# Patient Record
Sex: Female | Born: 1950 | Race: White | Hispanic: No | Smoking: Never smoker
Health system: Southern US, Community
[De-identification: ages and names within clinical notes are randomized; demographics above are authoritative.]

## PROBLEM LIST (undated history)

## (undated) DIAGNOSIS — E785 Hyperlipidemia, unspecified: Secondary | ICD-10-CM

## (undated) DIAGNOSIS — L0291 Cutaneous abscess, unspecified: Secondary | ICD-10-CM

## (undated) DIAGNOSIS — K219 Gastro-esophageal reflux disease without esophagitis: Secondary | ICD-10-CM

## (undated) DIAGNOSIS — D472 Monoclonal gammopathy: Secondary | ICD-10-CM

## (undated) DIAGNOSIS — D649 Anemia, unspecified: Secondary | ICD-10-CM

## (undated) DIAGNOSIS — F329 Major depressive disorder, single episode, unspecified: Secondary | ICD-10-CM

## (undated) DIAGNOSIS — N179 Acute kidney failure, unspecified: Secondary | ICD-10-CM

## (undated) DIAGNOSIS — Z5189 Encounter for other specified aftercare: Secondary | ICD-10-CM

## (undated) DIAGNOSIS — E1165 Type 2 diabetes mellitus with hyperglycemia: Secondary | ICD-10-CM

## (undated) DIAGNOSIS — N189 Chronic kidney disease, unspecified: Secondary | ICD-10-CM

## (undated) DIAGNOSIS — G459 Transient cerebral ischemic attack, unspecified: Secondary | ICD-10-CM

## (undated) DIAGNOSIS — E118 Type 2 diabetes mellitus with unspecified complications: Secondary | ICD-10-CM

## (undated) DIAGNOSIS — I2510 Atherosclerotic heart disease of native coronary artery without angina pectoris: Secondary | ICD-10-CM

## (undated) DIAGNOSIS — L0390 Cellulitis, unspecified: Secondary | ICD-10-CM

## (undated) DIAGNOSIS — I10 Essential (primary) hypertension: Secondary | ICD-10-CM

## (undated) HISTORY — DX: Atherosclerotic heart disease of native coronary artery without angina pectoris: I25.10

## (undated) HISTORY — DX: Chronic kidney disease, unspecified: N18.9

## (undated) HISTORY — DX: Hyperlipidemia, unspecified: E78.5

## (undated) HISTORY — DX: Type 2 diabetes mellitus with unspecified complications: E11.8

## (undated) HISTORY — DX: Anemia, unspecified: D64.9

## (undated) HISTORY — DX: Major depressive disorder, single episode, unspecified: F32.9

## (undated) HISTORY — DX: Acute kidney failure, unspecified: N17.9

## (undated) HISTORY — DX: Monoclonal gammopathy: D47.2

## (undated) HISTORY — DX: Gastro-esophageal reflux disease without esophagitis: K21.9

## (undated) HISTORY — DX: Type 2 diabetes mellitus with hyperglycemia: E11.65

## (undated) HISTORY — DX: Essential (primary) hypertension: I10

## (undated) HISTORY — PX: BLADDER SURGERY: SHX569

## (undated) HISTORY — PX: PARTIAL HYSTERECTOMY: SHX80

## (undated) HISTORY — PX: RECTOCELE REPAIR: SHX761

## (undated) HISTORY — DX: Transient cerebral ischemic attack, unspecified: G45.9

---

## 1999-02-13 ENCOUNTER — Other Ambulatory Visit: Admission: RE | Admit: 1999-02-13 | Discharge: 1999-02-13 | Payer: Self-pay | Admitting: *Deleted

## 2004-07-07 ENCOUNTER — Encounter (INDEPENDENT_AMBULATORY_CARE_PROVIDER_SITE_OTHER): Payer: Self-pay | Admitting: Family Medicine

## 2006-09-26 ENCOUNTER — Ambulatory Visit: Payer: Self-pay | Admitting: Sports Medicine

## 2006-10-17 ENCOUNTER — Emergency Department (HOSPITAL_COMMUNITY): Admission: EM | Admit: 2006-10-17 | Discharge: 2006-10-17 | Payer: Self-pay | Admitting: Emergency Medicine

## 2006-11-27 ENCOUNTER — Ambulatory Visit: Payer: Self-pay | Admitting: Family Medicine

## 2007-02-21 ENCOUNTER — Ambulatory Visit: Payer: Self-pay | Admitting: Family Medicine

## 2007-02-21 ENCOUNTER — Encounter (INDEPENDENT_AMBULATORY_CARE_PROVIDER_SITE_OTHER): Payer: Self-pay | Admitting: Family Medicine

## 2007-02-27 DIAGNOSIS — IMO0001 Reserved for inherently not codable concepts without codable children: Secondary | ICD-10-CM

## 2007-02-27 DIAGNOSIS — I10 Essential (primary) hypertension: Secondary | ICD-10-CM

## 2007-02-27 DIAGNOSIS — I2510 Atherosclerotic heart disease of native coronary artery without angina pectoris: Secondary | ICD-10-CM | POA: Insufficient documentation

## 2007-02-27 DIAGNOSIS — F329 Major depressive disorder, single episode, unspecified: Secondary | ICD-10-CM

## 2007-02-27 DIAGNOSIS — D649 Anemia, unspecified: Secondary | ICD-10-CM

## 2007-02-27 DIAGNOSIS — M545 Low back pain: Secondary | ICD-10-CM

## 2007-02-27 DIAGNOSIS — E785 Hyperlipidemia, unspecified: Secondary | ICD-10-CM

## 2007-02-27 DIAGNOSIS — K219 Gastro-esophageal reflux disease without esophagitis: Secondary | ICD-10-CM

## 2007-02-27 DIAGNOSIS — E1165 Type 2 diabetes mellitus with hyperglycemia: Secondary | ICD-10-CM

## 2007-02-27 DIAGNOSIS — IMO0002 Reserved for concepts with insufficient information to code with codable children: Secondary | ICD-10-CM

## 2007-02-27 DIAGNOSIS — E118 Type 2 diabetes mellitus with unspecified complications: Secondary | ICD-10-CM

## 2007-02-27 DIAGNOSIS — F3289 Other specified depressive episodes: Secondary | ICD-10-CM

## 2007-02-27 HISTORY — DX: Anemia, unspecified: D64.9

## 2007-02-27 HISTORY — DX: Gastro-esophageal reflux disease without esophagitis: K21.9

## 2007-02-27 HISTORY — DX: Atherosclerotic heart disease of native coronary artery without angina pectoris: I25.10

## 2007-02-27 HISTORY — DX: Major depressive disorder, single episode, unspecified: F32.9

## 2007-02-27 HISTORY — DX: Hyperlipidemia, unspecified: E78.5

## 2007-02-27 HISTORY — DX: Type 2 diabetes mellitus with hyperglycemia: E11.65

## 2007-02-27 HISTORY — DX: Reserved for concepts with insufficient information to code with codable children: IMO0002

## 2007-02-27 HISTORY — DX: Other specified depressive episodes: F32.89

## 2007-02-27 HISTORY — DX: Essential (primary) hypertension: I10

## 2007-03-19 ENCOUNTER — Telehealth: Payer: Self-pay | Admitting: *Deleted

## 2007-04-08 ENCOUNTER — Telehealth (INDEPENDENT_AMBULATORY_CARE_PROVIDER_SITE_OTHER): Payer: Self-pay | Admitting: *Deleted

## 2007-04-22 ENCOUNTER — Telehealth (INDEPENDENT_AMBULATORY_CARE_PROVIDER_SITE_OTHER): Payer: Self-pay | Admitting: *Deleted

## 2007-04-24 ENCOUNTER — Telehealth (INDEPENDENT_AMBULATORY_CARE_PROVIDER_SITE_OTHER): Payer: Self-pay | Admitting: *Deleted

## 2007-05-16 ENCOUNTER — Ambulatory Visit: Payer: Self-pay | Admitting: Family Medicine

## 2007-05-16 ENCOUNTER — Telehealth: Payer: Self-pay | Admitting: *Deleted

## 2007-05-16 LAB — CONVERTED CEMR LAB: WBC, UA: >20 cells/hpf

## 2007-06-24 ENCOUNTER — Telehealth: Payer: Self-pay | Admitting: *Deleted

## 2007-06-26 ENCOUNTER — Ambulatory Visit: Payer: Self-pay | Admitting: Sports Medicine

## 2007-06-26 ENCOUNTER — Telehealth: Payer: Self-pay | Admitting: *Deleted

## 2007-06-26 LAB — CONVERTED CEMR LAB
Nitrite: NEGATIVE
Protein, U semiquant: 30
Urobilinogen, UA: 0.2
WBC, UA: >20 cells/hpf

## 2007-07-01 ENCOUNTER — Telehealth: Payer: Self-pay | Admitting: *Deleted

## 2007-08-28 ENCOUNTER — Ambulatory Visit: Payer: Self-pay | Admitting: Hematology and Oncology

## 2007-10-01 ENCOUNTER — Telehealth (INDEPENDENT_AMBULATORY_CARE_PROVIDER_SITE_OTHER): Payer: Self-pay | Admitting: Family Medicine

## 2007-10-13 ENCOUNTER — Telehealth (INDEPENDENT_AMBULATORY_CARE_PROVIDER_SITE_OTHER): Payer: Self-pay | Admitting: *Deleted

## 2007-10-20 ENCOUNTER — Ambulatory Visit: Payer: Self-pay | Admitting: Hematology and Oncology

## 2007-10-21 ENCOUNTER — Encounter (INDEPENDENT_AMBULATORY_CARE_PROVIDER_SITE_OTHER): Payer: Self-pay | Admitting: Family Medicine

## 2007-10-21 LAB — CONVERTED CEMR LAB
HCT: 24 %
Hemoglobin: 8.4 g/dL
Platelets: 166 10*3/uL
Sed Rate: 20 mm/hr
Sodium: 137 meq/L

## 2007-10-21 LAB — CBC & DIFF AND RETIC
Basophils Absolute: 0 10*3/uL (ref 0.0–0.1)
EOS%: 2.4 % (ref 0.0–7.0)
Eosinophils Absolute: 0.1 10*3/uL (ref 0.0–0.5)
HCT: 24 % — ABNORMAL LOW (ref 34.8–46.6)
HGB: 8.4 g/dL — ABNORMAL LOW (ref 11.6–15.9)
IRF: 0.29 (ref 0.130–0.330)
MCH: 31.8 pg (ref 26.0–34.0)
MCV: 90.1 fL (ref 81.0–101.0)
NEUT#: 3.5 10*3/uL (ref 1.5–6.5)
NEUT%: 70.2 % (ref 39.6–76.8)
RETIC #: 55.1 10*3/uL (ref 19.7–115.1)
lymph#: 1.1 10*3/uL (ref 0.9–3.3)

## 2007-10-21 LAB — URINALYSIS, MICROSCOPIC - CHCC
Blood: NEGATIVE
Ketones: NEGATIVE mg/dL
Protein: <30 mg/dL
RBC count: NEGATIVE (ref 0–2)
pH: 6 (ref 4.6–8.0)

## 2007-10-21 LAB — ERYTHROCYTE SEDIMENTATION RATE: Sed Rate: 20 mm/hr (ref 0–30)

## 2007-10-23 LAB — COMPREHENSIVE METABOLIC PANEL
ALT: 37 U/L — ABNORMAL HIGH (ref 0–35)
Albumin: 4 g/dL (ref 3.5–5.2)
CO2: 18 mEq/L — ABNORMAL LOW (ref 19–32)
Calcium: 8.8 mg/dL (ref 8.4–10.5)
Chloride: 109 mEq/L (ref 96–112)
Glucose, Bld: 375 mg/dL — ABNORMAL HIGH (ref 70–99)
Potassium: 5.8 mEq/L — ABNORMAL HIGH (ref 3.5–5.3)
Sodium: 137 mEq/L (ref 135–145)
Total Bilirubin: 0.2 mg/dL — ABNORMAL LOW (ref 0.3–1.2)
Total Protein: 6.4 g/dL (ref 6.0–8.3)

## 2007-10-23 LAB — PROTEIN ELECTROPHORESIS, SERUM
Albumin ELP: 61.7 % (ref 55.8–66.1)
Alpha-1-Globulin: 4.5 % (ref 2.9–4.9)
Alpha-2-Globulin: 9.7 % (ref 7.1–11.8)
Beta 2: 3.9 % (ref 3.2–6.5)
Beta Globulin: 4.9 % (ref 4.7–7.2)
Gamma Globulin: 15.3 % (ref 11.1–18.8)

## 2007-10-23 LAB — FOLATE: Folate: >20 ng/mL

## 2007-10-23 LAB — LACTATE DEHYDROGENASE: LDH: 180 U/L (ref 94–250)

## 2007-10-23 LAB — DIRECT ANTIGLOBULIN TEST (NOT AT ARMC): DAT (Complement): NEGATIVE

## 2007-10-23 LAB — ERYTHROPOIETIN: Erythropoietin: 7.6 m[IU]/mL (ref 2.6–34.0)

## 2007-10-23 LAB — IRON AND TIBC: Iron: 98 ug/dL (ref 42–145)

## 2007-10-27 ENCOUNTER — Telehealth (INDEPENDENT_AMBULATORY_CARE_PROVIDER_SITE_OTHER): Payer: Self-pay | Admitting: Family Medicine

## 2007-10-29 ENCOUNTER — Encounter: Payer: Self-pay | Admitting: Hematology and Oncology

## 2007-10-29 ENCOUNTER — Other Ambulatory Visit: Admission: RE | Admit: 2007-10-29 | Discharge: 2007-10-29 | Payer: Self-pay | Admitting: Hematology and Oncology

## 2007-11-04 ENCOUNTER — Encounter (INDEPENDENT_AMBULATORY_CARE_PROVIDER_SITE_OTHER): Payer: Self-pay | Admitting: Family Medicine

## 2007-11-04 LAB — CONVERTED CEMR LAB
Albumin: 4 g/dL
Creatinine, Ser: 1.2 mg/dL
Ferritin: 371 ng/mL
Iron: 98 ug/dL
Saturation Ratios: 39 %
TIBC: 253 ug/dL
Total Bilirubin: 0.2 mg/dL
UIBC: 155 ug/dL

## 2007-11-05 ENCOUNTER — Telehealth (INDEPENDENT_AMBULATORY_CARE_PROVIDER_SITE_OTHER): Payer: Self-pay | Admitting: *Deleted

## 2007-11-06 ENCOUNTER — Encounter (INDEPENDENT_AMBULATORY_CARE_PROVIDER_SITE_OTHER): Payer: Self-pay | Admitting: Family Medicine

## 2007-11-06 ENCOUNTER — Ambulatory Visit: Payer: Self-pay | Admitting: Family Medicine

## 2007-11-13 ENCOUNTER — Telehealth: Payer: Self-pay | Admitting: *Deleted

## 2007-11-13 LAB — CONVERTED CEMR LAB
Calcium: 8.7 mg/dL (ref 8.4–10.5)
Potassium: 4.9 meq/L (ref 3.5–5.3)
Sodium: 143 meq/L (ref 135–145)

## 2007-11-19 LAB — CBC WITH DIFFERENTIAL/PLATELET
BASO%: 0.7 % (ref 0.0–2.0)
Eosinophils Absolute: 0.1 10*3/uL (ref 0.0–0.5)
LYMPH%: 28.8 % (ref 14.0–48.0)
MCHC: 33.4 g/dL (ref 32.0–36.0)
MONO#: 0.3 10*3/uL (ref 0.1–0.9)
MONO%: 5 % (ref 0.0–13.0)
NEUT#: 3.2 10*3/uL (ref 1.5–6.5)
Platelets: 194 10*3/uL (ref 145–400)
RBC: 3.43 10*6/uL — ABNORMAL LOW (ref 3.70–5.32)
RDW: 12.6 % (ref 11.3–14.5)
WBC: 5 10*3/uL (ref 3.9–10.0)

## 2007-12-08 ENCOUNTER — Ambulatory Visit: Payer: Self-pay | Admitting: Hematology and Oncology

## 2007-12-22 ENCOUNTER — Telehealth: Payer: Self-pay | Admitting: *Deleted

## 2008-03-08 ENCOUNTER — Ambulatory Visit: Payer: Self-pay | Admitting: Family Medicine

## 2008-03-10 ENCOUNTER — Telehealth: Payer: Self-pay | Admitting: *Deleted

## 2008-03-16 ENCOUNTER — Telehealth: Payer: Self-pay | Admitting: *Deleted

## 2008-03-31 ENCOUNTER — Telehealth (INDEPENDENT_AMBULATORY_CARE_PROVIDER_SITE_OTHER): Payer: Self-pay | Admitting: *Deleted

## 2008-04-06 ENCOUNTER — Telehealth (INDEPENDENT_AMBULATORY_CARE_PROVIDER_SITE_OTHER): Payer: Self-pay | Admitting: Family Medicine

## 2008-04-07 ENCOUNTER — Telehealth: Payer: Self-pay | Admitting: *Deleted

## 2008-05-25 ENCOUNTER — Encounter (INDEPENDENT_AMBULATORY_CARE_PROVIDER_SITE_OTHER): Payer: Self-pay | Admitting: Family Medicine

## 2008-05-31 ENCOUNTER — Telehealth (INDEPENDENT_AMBULATORY_CARE_PROVIDER_SITE_OTHER): Payer: Self-pay | Admitting: Family Medicine

## 2008-06-07 ENCOUNTER — Telehealth: Payer: Self-pay | Admitting: *Deleted

## 2008-06-25 ENCOUNTER — Encounter (INDEPENDENT_AMBULATORY_CARE_PROVIDER_SITE_OTHER): Payer: Self-pay | Admitting: Family Medicine

## 2008-06-28 ENCOUNTER — Telehealth: Payer: Self-pay | Admitting: *Deleted

## 2008-06-30 ENCOUNTER — Ambulatory Visit: Payer: Self-pay | Admitting: Family Medicine

## 2008-06-30 ENCOUNTER — Encounter (INDEPENDENT_AMBULATORY_CARE_PROVIDER_SITE_OTHER): Payer: Self-pay | Admitting: Family Medicine

## 2008-06-30 DIAGNOSIS — F411 Generalized anxiety disorder: Secondary | ICD-10-CM | POA: Insufficient documentation

## 2008-06-30 DIAGNOSIS — R3915 Urgency of urination: Secondary | ICD-10-CM

## 2008-06-30 LAB — CONVERTED CEMR LAB
Bilirubin Urine: NEGATIVE
Blood in Urine, dipstick: NEGATIVE
Glucose, Urine, Semiquant: NEGATIVE
Protein, U semiquant: NEGATIVE
Specific Gravity, Urine: 1.015
pH: 5

## 2008-07-03 ENCOUNTER — Telehealth (INDEPENDENT_AMBULATORY_CARE_PROVIDER_SITE_OTHER): Payer: Self-pay | Admitting: Family Medicine

## 2008-07-19 ENCOUNTER — Ambulatory Visit: Payer: Self-pay | Admitting: Cardiovascular Disease

## 2008-07-19 LAB — CONVERTED CEMR LAB
BUN: 23 mg/dL (ref 6–23)
Calcium: 9 mg/dL (ref 8.4–10.5)
Eosinophils Absolute: 0.1 10*3/uL (ref 0.0–0.7)
Eosinophils Relative: 2 % (ref 0.0–5.0)
GFR calc Af Amer: 54 mL/min
GFR calc non Af Amer: 45 mL/min
Glucose, Bld: 94 mg/dL (ref 70–99)
HCT: 23.4 % — CL (ref 36.0–46.0)
Hemoglobin: 8 g/dL — ABNORMAL LOW (ref 12.0–15.0)
INR: 0.9 (ref 0.8–1.0)
MCV: 94.8 fL (ref 78.0–100.0)
Monocytes Absolute: 0.2 10*3/uL (ref 0.1–1.0)
Monocytes Relative: 3.6 % (ref 3.0–12.0)
Neutro Abs: 3.5 10*3/uL (ref 1.4–7.7)
Platelets: 157 10*3/uL (ref 150–400)
RDW: 11.9 % (ref 11.5–14.6)
WBC: 5.1 10*3/uL (ref 4.5–10.5)

## 2008-07-20 ENCOUNTER — Encounter (INDEPENDENT_AMBULATORY_CARE_PROVIDER_SITE_OTHER): Payer: Self-pay | Admitting: Family Medicine

## 2008-07-28 ENCOUNTER — Ambulatory Visit: Payer: Self-pay | Admitting: Family Medicine

## 2008-07-28 ENCOUNTER — Encounter (INDEPENDENT_AMBULATORY_CARE_PROVIDER_SITE_OTHER): Payer: Self-pay | Admitting: Family Medicine

## 2008-07-28 ENCOUNTER — Telehealth: Payer: Self-pay | Admitting: *Deleted

## 2008-07-28 ENCOUNTER — Encounter: Admission: RE | Admit: 2008-07-28 | Discharge: 2008-07-28 | Payer: Self-pay | Admitting: Family Medicine

## 2008-07-29 ENCOUNTER — Telehealth: Payer: Self-pay | Admitting: *Deleted

## 2008-07-29 ENCOUNTER — Telehealth (INDEPENDENT_AMBULATORY_CARE_PROVIDER_SITE_OTHER): Payer: Self-pay | Admitting: Family Medicine

## 2008-08-01 LAB — CONVERTED CEMR LAB
ALT: 18 units/L (ref 0–35)
AST: 15 units/L (ref 0–37)
Alkaline Phosphatase: 107 units/L (ref 39–117)
Glucose, Bld: 311 mg/dL — ABNORMAL HIGH (ref 70–99)
MCHC: 32 g/dL (ref 30.0–36.0)
MCV: 97.3 fL (ref 78.0–100.0)
Platelets: 182 10*3/uL (ref 150–400)
Potassium: 5.8 meq/L — ABNORMAL HIGH (ref 3.5–5.3)
RDW: 13.2 % (ref 11.5–15.5)
Sodium: 140 meq/L (ref 135–145)
Total Bilirubin: 0.3 mg/dL (ref 0.3–1.2)
Total Protein: 6.5 g/dL (ref 6.0–8.3)
Vitamin B-12: 898 pg/mL (ref 211–911)

## 2008-08-02 ENCOUNTER — Encounter (INDEPENDENT_AMBULATORY_CARE_PROVIDER_SITE_OTHER): Payer: Self-pay | Admitting: Family Medicine

## 2008-08-02 ENCOUNTER — Ambulatory Visit: Payer: Self-pay | Admitting: Family Medicine

## 2008-08-03 ENCOUNTER — Ambulatory Visit: Payer: Self-pay | Admitting: Cardiology

## 2008-08-03 ENCOUNTER — Encounter (INDEPENDENT_AMBULATORY_CARE_PROVIDER_SITE_OTHER): Payer: Self-pay | Admitting: Family Medicine

## 2008-08-03 ENCOUNTER — Encounter: Payer: Self-pay | Admitting: Family Medicine

## 2008-08-03 ENCOUNTER — Ambulatory Visit: Payer: Self-pay | Admitting: Family Medicine

## 2008-08-03 ENCOUNTER — Telehealth (INDEPENDENT_AMBULATORY_CARE_PROVIDER_SITE_OTHER): Payer: Self-pay | Admitting: Family Medicine

## 2008-08-03 ENCOUNTER — Inpatient Hospital Stay (HOSPITAL_COMMUNITY): Admission: EM | Admit: 2008-08-03 | Discharge: 2008-08-06 | Payer: Self-pay | Admitting: Emergency Medicine

## 2008-08-04 ENCOUNTER — Encounter (INDEPENDENT_AMBULATORY_CARE_PROVIDER_SITE_OTHER): Payer: Self-pay | Admitting: Family Medicine

## 2008-08-04 LAB — CONVERTED CEMR LAB
HDL: 41 mg/dL
LDL Cholesterol: 65 mg/dL
VLDL: 23 mg/dL

## 2008-08-05 ENCOUNTER — Ambulatory Visit: Payer: Self-pay | Admitting: Hematology and Oncology

## 2008-08-09 ENCOUNTER — Encounter (INDEPENDENT_AMBULATORY_CARE_PROVIDER_SITE_OTHER): Payer: Self-pay | Admitting: Family Medicine

## 2008-08-09 ENCOUNTER — Encounter (HOSPITAL_COMMUNITY): Admission: RE | Admit: 2008-08-09 | Discharge: 2008-09-09 | Payer: Self-pay | Admitting: Oncology

## 2008-08-09 LAB — MORPHOLOGY: PLT EST: ADEQUATE

## 2008-08-09 LAB — CBC & DIFF AND RETIC
Basophils Absolute: 0 10*3/uL (ref 0.0–0.1)
EOS%: 2.9 % (ref 0.0–7.0)
MCHC: 34.5 g/dL (ref 32.0–36.0)
MONO%: 4.9 % (ref 0.0–13.0)
NEUT#: 3.1 10*3/uL (ref 1.5–6.5)
NEUT%: 62.6 % (ref 39.6–76.8)
Platelets: 189 10*3/uL (ref 145–400)
RBC: 2.55 10*6/uL — ABNORMAL LOW (ref 3.70–5.32)
RDW: 12.7 % (ref 11.3–14.5)
WBC: 4.9 10*3/uL (ref 3.9–10.0)

## 2008-08-09 LAB — CHCC SMEAR

## 2008-08-11 LAB — COMPREHENSIVE METABOLIC PANEL
Albumin: 4.1 g/dL (ref 3.5–5.2)
BUN: 47 mg/dL — ABNORMAL HIGH (ref 6–23)
Calcium: 9.1 mg/dL (ref 8.4–10.5)
Chloride: 111 mEq/L (ref 96–112)
Glucose, Bld: 199 mg/dL — ABNORMAL HIGH (ref 70–99)
Potassium: 5.7 mEq/L — ABNORMAL HIGH (ref 3.5–5.3)

## 2008-08-11 LAB — SPEP & IFE WITH QIG
Alpha-1-Globulin: 4 % (ref 2.9–4.9)
Gamma Globulin: 14.7 % (ref 11.1–18.8)
IgM, Serum: 95 mg/dL (ref 60–263)
M-Spike, %: 0.39 g/dL

## 2008-08-11 LAB — LACTATE DEHYDROGENASE: LDH: 177 U/L (ref 94–250)

## 2008-08-17 ENCOUNTER — Telehealth (INDEPENDENT_AMBULATORY_CARE_PROVIDER_SITE_OTHER): Payer: Self-pay | Admitting: *Deleted

## 2008-08-18 LAB — CBC WITH DIFFERENTIAL/PLATELET
BASO%: 0.4 % (ref 0.0–2.0)
HCT: 37 % (ref 34.8–46.6)
MCHC: 33.3 g/dL (ref 32.0–36.0)
MONO#: 0.3 10*3/uL (ref 0.1–0.9)
RBC: 3.94 10*6/uL (ref 3.70–5.32)
RDW: 13.8 % (ref 11.3–14.5)
WBC: 5.3 10*3/uL (ref 3.9–10.0)
lymph#: 1.1 10*3/uL (ref 0.9–3.3)

## 2008-08-24 ENCOUNTER — Other Ambulatory Visit: Admission: RE | Admit: 2008-08-24 | Discharge: 2008-08-24 | Payer: Self-pay | Admitting: Family Medicine

## 2008-08-24 ENCOUNTER — Encounter (INDEPENDENT_AMBULATORY_CARE_PROVIDER_SITE_OTHER): Payer: Self-pay | Admitting: Family Medicine

## 2008-08-24 ENCOUNTER — Ambulatory Visit: Payer: Self-pay | Admitting: Family Medicine

## 2008-08-24 LAB — CONVERTED CEMR LAB

## 2008-08-25 ENCOUNTER — Encounter (INDEPENDENT_AMBULATORY_CARE_PROVIDER_SITE_OTHER): Payer: Self-pay | Admitting: Family Medicine

## 2008-08-25 DIAGNOSIS — G43711 Chronic migraine without aura, intractable, with status migrainosus: Secondary | ICD-10-CM

## 2008-08-25 LAB — CONVERTED CEMR LAB
CO2: 18 meq/L — ABNORMAL LOW (ref 19–32)
Calcium: 9.2 mg/dL (ref 8.4–10.5)
Potassium: 4.8 meq/L (ref 3.5–5.3)
Sodium: 139 meq/L (ref 135–145)

## 2008-08-26 LAB — CBC WITH DIFFERENTIAL/PLATELET
BASO%: 0.7 % (ref 0.0–2.0)
Basophils Absolute: 0 10*3/uL (ref 0.0–0.1)
HCT: 38.3 % (ref 34.8–46.6)
HGB: 12.7 g/dL (ref 11.6–15.9)
MONO#: 0.3 10*3/uL (ref 0.1–0.9)
NEUT#: 3 10*3/uL (ref 1.5–6.5)
NEUT%: 63.7 % (ref 39.6–76.8)
RDW: 14.4 % (ref 11.3–14.5)
WBC: 4.7 10*3/uL (ref 3.9–10.0)
lymph#: 1.2 10*3/uL (ref 0.9–3.3)

## 2008-08-31 ENCOUNTER — Telehealth (INDEPENDENT_AMBULATORY_CARE_PROVIDER_SITE_OTHER): Payer: Self-pay | Admitting: *Deleted

## 2008-09-03 ENCOUNTER — Ambulatory Visit (HOSPITAL_COMMUNITY): Admission: RE | Admit: 2008-09-03 | Discharge: 2008-09-03 | Payer: Self-pay | Admitting: Oncology

## 2008-09-07 ENCOUNTER — Telehealth (INDEPENDENT_AMBULATORY_CARE_PROVIDER_SITE_OTHER): Payer: Self-pay | Admitting: *Deleted

## 2008-09-13 ENCOUNTER — Encounter (INDEPENDENT_AMBULATORY_CARE_PROVIDER_SITE_OTHER): Payer: Self-pay | Admitting: Family Medicine

## 2008-09-23 ENCOUNTER — Ambulatory Visit: Payer: Self-pay | Admitting: Hematology and Oncology

## 2008-10-01 ENCOUNTER — Telehealth (INDEPENDENT_AMBULATORY_CARE_PROVIDER_SITE_OTHER): Payer: Self-pay | Admitting: Family Medicine

## 2008-10-06 ENCOUNTER — Ambulatory Visit: Payer: Self-pay | Admitting: Family Medicine

## 2008-10-07 ENCOUNTER — Telehealth: Payer: Self-pay | Admitting: *Deleted

## 2008-10-08 ENCOUNTER — Encounter: Payer: Self-pay | Admitting: *Deleted

## 2008-11-01 ENCOUNTER — Telehealth (INDEPENDENT_AMBULATORY_CARE_PROVIDER_SITE_OTHER): Payer: Self-pay | Admitting: Family Medicine

## 2008-11-23 ENCOUNTER — Ambulatory Visit: Payer: Self-pay | Admitting: Hematology and Oncology

## 2008-12-01 ENCOUNTER — Telehealth: Payer: Self-pay | Admitting: *Deleted

## 2008-12-02 ENCOUNTER — Telehealth (INDEPENDENT_AMBULATORY_CARE_PROVIDER_SITE_OTHER): Payer: Self-pay | Admitting: *Deleted

## 2008-12-02 ENCOUNTER — Encounter (INDEPENDENT_AMBULATORY_CARE_PROVIDER_SITE_OTHER): Payer: Self-pay | Admitting: Family Medicine

## 2008-12-06 ENCOUNTER — Telehealth: Payer: Self-pay | Admitting: *Deleted

## 2008-12-07 ENCOUNTER — Telehealth: Payer: Self-pay | Admitting: *Deleted

## 2008-12-13 ENCOUNTER — Telehealth: Payer: Self-pay | Admitting: *Deleted

## 2008-12-14 ENCOUNTER — Ambulatory Visit: Payer: Self-pay | Admitting: Family Medicine

## 2008-12-14 LAB — CONVERTED CEMR LAB
Bilirubin Urine: NEGATIVE
Blood in Urine, dipstick: NEGATIVE
Hgb A1c MFr Bld: 8.4 %
Nitrite: POSITIVE
Specific Gravity, Urine: 1.02

## 2008-12-15 ENCOUNTER — Telehealth: Payer: Self-pay | Admitting: *Deleted

## 2008-12-21 ENCOUNTER — Telehealth: Payer: Self-pay | Admitting: Family Medicine

## 2008-12-29 ENCOUNTER — Telehealth: Payer: Self-pay | Admitting: *Deleted

## 2009-01-04 ENCOUNTER — Telehealth: Payer: Self-pay | Admitting: Family Medicine

## 2009-02-02 ENCOUNTER — Telehealth (INDEPENDENT_AMBULATORY_CARE_PROVIDER_SITE_OTHER): Payer: Self-pay | Admitting: Family Medicine

## 2009-02-28 ENCOUNTER — Telehealth (INDEPENDENT_AMBULATORY_CARE_PROVIDER_SITE_OTHER): Payer: Self-pay | Admitting: *Deleted

## 2009-03-07 ENCOUNTER — Telehealth (INDEPENDENT_AMBULATORY_CARE_PROVIDER_SITE_OTHER): Payer: Self-pay | Admitting: Family Medicine

## 2009-04-04 ENCOUNTER — Telehealth (INDEPENDENT_AMBULATORY_CARE_PROVIDER_SITE_OTHER): Payer: Self-pay | Admitting: Family Medicine

## 2009-05-04 ENCOUNTER — Telehealth (INDEPENDENT_AMBULATORY_CARE_PROVIDER_SITE_OTHER): Payer: Self-pay | Admitting: Family Medicine

## 2009-05-17 ENCOUNTER — Ambulatory Visit: Payer: Self-pay | Admitting: Family Medicine

## 2009-05-17 ENCOUNTER — Encounter (INDEPENDENT_AMBULATORY_CARE_PROVIDER_SITE_OTHER): Payer: Self-pay | Admitting: Family Medicine

## 2009-05-17 ENCOUNTER — Telehealth (INDEPENDENT_AMBULATORY_CARE_PROVIDER_SITE_OTHER): Payer: Self-pay | Admitting: Family Medicine

## 2009-05-17 DIAGNOSIS — G459 Transient cerebral ischemic attack, unspecified: Secondary | ICD-10-CM | POA: Insufficient documentation

## 2009-05-17 HISTORY — DX: Transient cerebral ischemic attack, unspecified: G45.9

## 2009-05-17 LAB — CONVERTED CEMR LAB
Blood in Urine, dipstick: NEGATIVE
Glucose, Urine, Semiquant: NEGATIVE
Nitrite: NEGATIVE
Protein, U semiquant: 30
Urobilinogen, UA: 0.2
pH: 7

## 2009-05-19 ENCOUNTER — Encounter (INDEPENDENT_AMBULATORY_CARE_PROVIDER_SITE_OTHER): Payer: Self-pay | Admitting: Family Medicine

## 2009-05-19 ENCOUNTER — Ambulatory Visit (HOSPITAL_COMMUNITY): Admission: RE | Admit: 2009-05-19 | Discharge: 2009-05-19 | Payer: Self-pay | Admitting: Family Medicine

## 2009-05-19 LAB — CONVERTED CEMR LAB
Albumin: 4.2 g/dL (ref 3.5–5.2)
Alkaline Phosphatase: 138 units/L — ABNORMAL HIGH (ref 39–117)
BUN: 24 mg/dL — ABNORMAL HIGH (ref 6–23)
Calcium: 9.5 mg/dL (ref 8.4–10.5)
Chloride: 113 meq/L — ABNORMAL HIGH (ref 96–112)
Glucose, Bld: 188 mg/dL — ABNORMAL HIGH (ref 70–99)
Hemoglobin: 9.3 g/dL — ABNORMAL LOW (ref 12.0–15.0)
MCHC: 32.9 g/dL (ref 30.0–36.0)
Potassium: 5.3 meq/L (ref 3.5–5.3)
RBC: 2.97 M/uL — ABNORMAL LOW (ref 3.87–5.11)
Sodium: 140 meq/L (ref 135–145)
Total Protein: 6.9 g/dL (ref 6.0–8.3)
WBC: 5.5 10*3/uL (ref 4.0–10.5)

## 2009-05-20 ENCOUNTER — Ambulatory Visit: Payer: Self-pay | Admitting: Cardiology

## 2009-05-20 ENCOUNTER — Telehealth (INDEPENDENT_AMBULATORY_CARE_PROVIDER_SITE_OTHER): Payer: Self-pay | Admitting: Family Medicine

## 2009-05-25 ENCOUNTER — Encounter: Payer: Self-pay | Admitting: Family Medicine

## 2009-05-25 ENCOUNTER — Ambulatory Visit (HOSPITAL_COMMUNITY): Admission: RE | Admit: 2009-05-25 | Discharge: 2009-05-25 | Payer: Self-pay | Admitting: Family Medicine

## 2009-05-26 ENCOUNTER — Telehealth (INDEPENDENT_AMBULATORY_CARE_PROVIDER_SITE_OTHER): Payer: Self-pay | Admitting: *Deleted

## 2009-05-26 ENCOUNTER — Telehealth (INDEPENDENT_AMBULATORY_CARE_PROVIDER_SITE_OTHER): Payer: Self-pay | Admitting: Family Medicine

## 2009-05-31 ENCOUNTER — Telehealth (INDEPENDENT_AMBULATORY_CARE_PROVIDER_SITE_OTHER): Payer: Self-pay | Admitting: Family Medicine

## 2009-06-02 ENCOUNTER — Telehealth: Payer: Self-pay | Admitting: *Deleted

## 2009-06-02 ENCOUNTER — Telehealth (INDEPENDENT_AMBULATORY_CARE_PROVIDER_SITE_OTHER): Payer: Self-pay | Admitting: *Deleted

## 2009-06-03 ENCOUNTER — Encounter: Payer: Self-pay | Admitting: Family Medicine

## 2009-06-03 ENCOUNTER — Ambulatory Visit: Payer: Self-pay | Admitting: Family Medicine

## 2009-06-03 LAB — CONVERTED CEMR LAB
Hemoglobin: 9.3 g/dL — ABNORMAL LOW (ref 12.0–15.0)
INR: 1 (ref 0.0–1.5)
MCHC: 33.6 g/dL (ref 30.0–36.0)
Prothrombin Time: 13.4 s (ref 11.6–15.2)
RBC: 2.98 M/uL — ABNORMAL LOW (ref 3.87–5.11)
RDW: 12.4 % (ref 11.5–15.5)
aPTT: 30 s (ref 24–37)

## 2009-06-06 ENCOUNTER — Telehealth (INDEPENDENT_AMBULATORY_CARE_PROVIDER_SITE_OTHER): Payer: Self-pay | Admitting: *Deleted

## 2009-06-07 ENCOUNTER — Encounter (INDEPENDENT_AMBULATORY_CARE_PROVIDER_SITE_OTHER): Payer: Self-pay | Admitting: Family Medicine

## 2009-06-08 ENCOUNTER — Encounter: Payer: Self-pay | Admitting: Family Medicine

## 2009-06-09 ENCOUNTER — Telehealth: Payer: Self-pay | Admitting: Family Medicine

## 2009-06-30 ENCOUNTER — Telehealth: Payer: Self-pay | Admitting: Family Medicine

## 2009-08-01 ENCOUNTER — Telehealth: Payer: Self-pay | Admitting: Family Medicine

## 2009-08-05 ENCOUNTER — Ambulatory Visit: Payer: Self-pay | Admitting: Family Medicine

## 2009-08-05 DIAGNOSIS — R109 Unspecified abdominal pain: Secondary | ICD-10-CM | POA: Insufficient documentation

## 2009-08-05 DIAGNOSIS — R5381 Other malaise: Secondary | ICD-10-CM | POA: Insufficient documentation

## 2009-08-05 DIAGNOSIS — D472 Monoclonal gammopathy: Secondary | ICD-10-CM

## 2009-08-05 DIAGNOSIS — R5383 Other fatigue: Secondary | ICD-10-CM

## 2009-08-05 HISTORY — DX: Monoclonal gammopathy: D47.2

## 2009-08-05 LAB — CONVERTED CEMR LAB: Hemoglobin: 8.5 g/dL

## 2009-08-07 ENCOUNTER — Encounter: Payer: Self-pay | Admitting: Family Medicine

## 2009-08-16 ENCOUNTER — Telehealth: Payer: Self-pay | Admitting: Family Medicine

## 2009-08-25 ENCOUNTER — Telehealth: Payer: Self-pay | Admitting: *Deleted

## 2009-08-25 ENCOUNTER — Encounter: Payer: Self-pay | Admitting: *Deleted

## 2009-08-30 ENCOUNTER — Ambulatory Visit: Payer: Self-pay | Admitting: Cardiovascular Disease

## 2009-08-30 ENCOUNTER — Ambulatory Visit: Payer: Self-pay | Admitting: Family Medicine

## 2009-08-30 ENCOUNTER — Ambulatory Visit: Payer: Self-pay | Admitting: Internal Medicine

## 2009-08-30 ENCOUNTER — Telehealth: Payer: Self-pay | Admitting: Cardiovascular Disease

## 2009-08-30 ENCOUNTER — Inpatient Hospital Stay (HOSPITAL_COMMUNITY): Admission: EM | Admit: 2009-08-30 | Discharge: 2009-09-12 | Payer: Self-pay | Admitting: Emergency Medicine

## 2009-08-31 ENCOUNTER — Encounter: Payer: Self-pay | Admitting: Internal Medicine

## 2009-09-05 ENCOUNTER — Encounter (INDEPENDENT_AMBULATORY_CARE_PROVIDER_SITE_OTHER): Payer: Self-pay | Admitting: *Deleted

## 2009-09-06 ENCOUNTER — Encounter (INDEPENDENT_AMBULATORY_CARE_PROVIDER_SITE_OTHER): Payer: Self-pay | Admitting: *Deleted

## 2009-09-08 ENCOUNTER — Encounter: Payer: Self-pay | Admitting: Cardiovascular Disease

## 2009-09-12 ENCOUNTER — Telehealth (INDEPENDENT_AMBULATORY_CARE_PROVIDER_SITE_OTHER): Payer: Self-pay | Admitting: Family Medicine

## 2009-09-16 ENCOUNTER — Ambulatory Visit: Payer: Self-pay | Admitting: Internal Medicine

## 2009-09-17 ENCOUNTER — Encounter: Payer: Self-pay | Admitting: Sports Medicine

## 2009-09-17 ENCOUNTER — Encounter: Payer: Self-pay | Admitting: Family Medicine

## 2009-09-17 ENCOUNTER — Inpatient Hospital Stay (HOSPITAL_COMMUNITY): Admission: EM | Admit: 2009-09-17 | Discharge: 2009-09-20 | Payer: Self-pay | Admitting: Emergency Medicine

## 2009-09-17 ENCOUNTER — Ambulatory Visit: Payer: Self-pay | Admitting: Family Medicine

## 2009-09-17 ENCOUNTER — Encounter (INDEPENDENT_AMBULATORY_CARE_PROVIDER_SITE_OTHER): Payer: Self-pay | Admitting: *Deleted

## 2009-09-17 DIAGNOSIS — K5900 Constipation, unspecified: Secondary | ICD-10-CM | POA: Insufficient documentation

## 2009-09-18 ENCOUNTER — Encounter (INDEPENDENT_AMBULATORY_CARE_PROVIDER_SITE_OTHER): Payer: Self-pay | Admitting: Emergency Medicine

## 2009-09-18 ENCOUNTER — Encounter: Payer: Self-pay | Admitting: Internal Medicine

## 2009-09-20 ENCOUNTER — Telehealth: Payer: Self-pay | Admitting: Family Medicine

## 2009-09-21 ENCOUNTER — Telehealth: Payer: Self-pay | Admitting: Internal Medicine

## 2009-09-28 ENCOUNTER — Ambulatory Visit: Payer: Self-pay | Admitting: Family Medicine

## 2009-09-28 DIAGNOSIS — K626 Ulcer of anus and rectum: Secondary | ICD-10-CM

## 2009-09-29 ENCOUNTER — Telehealth: Payer: Self-pay | Admitting: Family Medicine

## 2009-09-30 ENCOUNTER — Encounter: Payer: Self-pay | Admitting: Family Medicine

## 2009-10-03 ENCOUNTER — Encounter: Payer: Self-pay | Admitting: *Deleted

## 2009-10-03 ENCOUNTER — Telehealth: Payer: Self-pay | Admitting: Internal Medicine

## 2009-10-04 ENCOUNTER — Encounter: Payer: Self-pay | Admitting: Family Medicine

## 2009-10-04 ENCOUNTER — Ambulatory Visit (HOSPITAL_COMMUNITY): Admission: RE | Admit: 2009-10-04 | Discharge: 2009-10-04 | Payer: Self-pay | Admitting: Family Medicine

## 2009-10-04 ENCOUNTER — Ambulatory Visit: Payer: Self-pay | Admitting: Family Medicine

## 2009-10-04 DIAGNOSIS — L299 Pruritus, unspecified: Secondary | ICD-10-CM | POA: Insufficient documentation

## 2009-10-07 ENCOUNTER — Telehealth: Payer: Self-pay | Admitting: Family Medicine

## 2009-10-11 ENCOUNTER — Encounter: Payer: Self-pay | Admitting: Family Medicine

## 2009-10-11 ENCOUNTER — Ambulatory Visit: Payer: Self-pay | Admitting: Oncology

## 2009-10-12 ENCOUNTER — Encounter: Payer: Self-pay | Admitting: Family Medicine

## 2009-10-13 ENCOUNTER — Encounter: Payer: Self-pay | Admitting: Family Medicine

## 2009-10-13 LAB — CBC WITH DIFFERENTIAL/PLATELET
BASO%: 0.3 % (ref 0.0–2.0)
HCT: 25.9 % — ABNORMAL LOW (ref 34.8–46.6)
MCHC: 34.3 g/dL (ref 31.5–36.0)
MONO#: 0.3 10*3/uL (ref 0.1–0.9)
RBC: 2.71 10*6/uL — ABNORMAL LOW (ref 3.70–5.45)
WBC: 4.3 10*3/uL (ref 3.9–10.3)
lymph#: 1.3 10*3/uL (ref 0.9–3.3)

## 2009-10-17 LAB — PROTEIN ELECTROPHORESIS, SERUM
Albumin ELP: 59.5 % (ref 55.8–66.1)
Alpha-1-Globulin: 4.6 % (ref 2.9–4.9)
Alpha-2-Globulin: 10.6 % (ref 7.1–11.8)
Beta 2: 3.9 % (ref 3.2–6.5)
Beta Globulin: 5.2 % (ref 4.7–7.2)
Gamma Globulin: 16.2 % (ref 11.1–18.8)

## 2009-10-17 LAB — IRON AND TIBC: Iron: 103 ug/dL (ref 42–145)

## 2009-10-17 LAB — COMPREHENSIVE METABOLIC PANEL
ALT: 13 U/L (ref 0–35)
CO2: 20 mEq/L (ref 19–32)
Calcium: 8.8 mg/dL (ref 8.4–10.5)
Chloride: 112 mEq/L (ref 96–112)
Sodium: 142 mEq/L (ref 135–145)
Total Protein: 6.7 g/dL (ref 6.0–8.3)

## 2009-10-20 LAB — CREATININE CLEARANCE, URINE, 24 HOUR
Collection Interval-CRCL: 24 hours
Creatinine Clearance: 8 mL/min — ABNORMAL LOW (ref 75–115)
Creatinine, 24H Ur: 543 mg/d — ABNORMAL LOW (ref 700–1800)
Creatinine, Urine: 57.1 mg/dL

## 2009-10-20 LAB — UIFE/LIGHT CHAINS/TP QN, 24-HR UR
Albumin, U: DETECTED
Beta, Urine: DETECTED — AB
Free Kappa Lt Chains,Ur: 3.56 mg/dL — ABNORMAL HIGH (ref 0.04–1.51)
Free Lambda Lt Chains,Ur: 0.08 mg/dL (ref 0.08–1.01)
Gamma Globulin, Urine: DETECTED — AB
Time: 24 hours
Volume, Urine: 950 mL

## 2009-10-25 ENCOUNTER — Encounter: Payer: Self-pay | Admitting: Oncology

## 2009-10-25 ENCOUNTER — Ambulatory Visit (HOSPITAL_COMMUNITY): Admission: RE | Admit: 2009-10-25 | Discharge: 2009-10-25 | Payer: Self-pay | Admitting: Oncology

## 2009-10-25 LAB — CBC WITH DIFFERENTIAL/PLATELET
BASO%: 0.1 % (ref 0.0–2.0)
Eosinophils Absolute: 0.1 10*3/uL (ref 0.0–0.5)
HCT: 30.4 % — ABNORMAL LOW (ref 34.8–46.6)
LYMPH%: 30.2 % (ref 14.0–49.7)
MCHC: 33.7 g/dL (ref 31.5–36.0)
MCV: 99.2 fL (ref 79.5–101.0)
MONO%: 6 % (ref 0.0–14.0)
NEUT%: 61.2 % (ref 38.4–76.8)
Platelets: 168 10*3/uL (ref 145–400)
RBC: 3.07 10*6/uL — ABNORMAL LOW (ref 3.70–5.45)

## 2009-10-26 ENCOUNTER — Telehealth: Payer: Self-pay | Admitting: Family Medicine

## 2009-11-10 ENCOUNTER — Ambulatory Visit: Payer: Self-pay | Admitting: Oncology

## 2009-11-14 ENCOUNTER — Encounter: Payer: Self-pay | Admitting: Family Medicine

## 2009-11-14 LAB — COMPREHENSIVE METABOLIC PANEL
ALT: 19 U/L (ref 0–35)
CO2: 22 mEq/L (ref 19–32)
Calcium: 8.8 mg/dL (ref 8.4–10.5)
Chloride: 112 mEq/L (ref 96–112)
Creatinine, Ser: 1.51 mg/dL — ABNORMAL HIGH (ref 0.40–1.20)
Glucose, Bld: 79 mg/dL (ref 70–99)
Total Protein: 6.5 g/dL (ref 6.0–8.3)

## 2009-11-14 LAB — CBC WITH DIFFERENTIAL/PLATELET
Eosinophils Absolute: 0.1 10*3/uL (ref 0.0–0.5)
HGB: 10.5 g/dL — ABNORMAL LOW (ref 11.6–15.9)
LYMPH%: 39.1 % (ref 14.0–49.7)
MONO#: 0.3 10*3/uL (ref 0.1–0.9)
NEUT#: 2 10*3/uL (ref 1.5–6.5)
Platelets: 135 10*3/uL — ABNORMAL LOW (ref 145–400)
RBC: 3.24 10*6/uL — ABNORMAL LOW (ref 3.70–5.45)
WBC: 3.8 10*3/uL — ABNORMAL LOW (ref 3.9–10.3)

## 2009-11-14 LAB — CONVERTED CEMR LAB: Potassium: 5.7 meq/L

## 2009-11-18 ENCOUNTER — Encounter: Payer: Self-pay | Admitting: Family Medicine

## 2009-11-28 ENCOUNTER — Telehealth: Payer: Self-pay | Admitting: Family Medicine

## 2009-12-09 ENCOUNTER — Encounter: Payer: Self-pay | Admitting: Family Medicine

## 2009-12-12 ENCOUNTER — Telehealth: Payer: Self-pay | Admitting: Family Medicine

## 2009-12-28 ENCOUNTER — Telehealth: Payer: Self-pay | Admitting: Family Medicine

## 2009-12-28 ENCOUNTER — Encounter: Payer: Self-pay | Admitting: Family Medicine

## 2009-12-28 ENCOUNTER — Ambulatory Visit: Payer: Self-pay | Admitting: Family Medicine

## 2009-12-28 DIAGNOSIS — N189 Chronic kidney disease, unspecified: Secondary | ICD-10-CM

## 2009-12-28 HISTORY — DX: Chronic kidney disease, unspecified: N18.9

## 2009-12-28 LAB — CONVERTED CEMR LAB
Alkaline Phosphatase: 147 units/L — ABNORMAL HIGH (ref 39–117)
Basophils Relative: 0.5 % (ref 0.0–3.0)
Bilirubin, Direct: 0.1 mg/dL (ref 0.0–0.3)
Calcium: 9.2 mg/dL (ref 8.4–10.5)
Creatinine, Ser: 1.1 mg/dL (ref 0.4–1.2)
Eosinophils Absolute: 0.2 10*3/uL (ref 0.0–0.7)
HCT: 35.5 % — ABNORMAL LOW (ref 36.0–46.0)
HDL: 55.8 mg/dL (ref >39.00)
Hemoglobin: 11.8 g/dL — ABNORMAL LOW (ref 12.0–15.0)
LDL Cholesterol: 60 mg/dL (ref 0–99)
Lymphs Abs: 1.3 10*3/uL (ref 0.7–4.0)
MCHC: 33.1 g/dL (ref 30.0–36.0)
MCV: 97 fL (ref 78.0–100.0)
Monocytes Absolute: 0.3 10*3/uL (ref 0.1–1.0)
Neutro Abs: 2.4 10*3/uL (ref 1.4–7.7)
RBC: 3.66 M/uL — ABNORMAL LOW (ref 3.87–5.11)
RDW: 12.2 % (ref 11.5–14.6)
Sodium: 143 meq/L (ref 135–145)
Total Bilirubin: 0.5 mg/dL (ref 0.3–1.2)
Total CHOL/HDL Ratio: 3
Triglycerides: 148 mg/dL (ref 0.0–149.0)

## 2009-12-29 ENCOUNTER — Encounter: Payer: Self-pay | Admitting: Family Medicine

## 2009-12-29 DIAGNOSIS — K6289 Other specified diseases of anus and rectum: Secondary | ICD-10-CM

## 2010-01-03 ENCOUNTER — Telehealth: Payer: Self-pay | Admitting: Family Medicine

## 2010-01-05 ENCOUNTER — Telehealth: Payer: Self-pay | Admitting: Family Medicine

## 2010-01-06 ENCOUNTER — Ambulatory Visit: Payer: Self-pay | Admitting: Internal Medicine

## 2010-01-11 ENCOUNTER — Telehealth: Payer: Self-pay | Admitting: Internal Medicine

## 2010-01-13 ENCOUNTER — Telehealth: Payer: Self-pay | Admitting: Family Medicine

## 2010-01-19 ENCOUNTER — Encounter: Payer: Self-pay | Admitting: Family Medicine

## 2010-01-23 ENCOUNTER — Telehealth: Payer: Self-pay | Admitting: Family Medicine

## 2010-01-30 ENCOUNTER — Encounter: Payer: Self-pay | Admitting: Family Medicine

## 2010-01-31 ENCOUNTER — Telehealth: Payer: Self-pay | Admitting: Family Medicine

## 2010-01-31 DIAGNOSIS — L659 Nonscarring hair loss, unspecified: Secondary | ICD-10-CM | POA: Insufficient documentation

## 2010-02-02 ENCOUNTER — Telehealth: Payer: Self-pay | Admitting: Family Medicine

## 2010-02-03 ENCOUNTER — Telehealth: Payer: Self-pay | Admitting: Family Medicine

## 2010-02-06 ENCOUNTER — Telehealth: Payer: Self-pay | Admitting: Family Medicine

## 2010-02-08 ENCOUNTER — Encounter: Payer: Self-pay | Admitting: Family Medicine

## 2010-02-08 ENCOUNTER — Ambulatory Visit: Payer: Self-pay | Admitting: Oncology

## 2010-02-08 ENCOUNTER — Telehealth: Payer: Self-pay | Admitting: Family Medicine

## 2010-02-09 ENCOUNTER — Encounter: Payer: Self-pay | Admitting: Family Medicine

## 2010-02-09 LAB — CBC WITH DIFFERENTIAL (CANCER CENTER ONLY)
Eosinophils Absolute: 0.2 10*3/uL (ref 0.0–0.5)
LYMPH#: 1.8 10*3/uL (ref 0.9–3.3)
MCH: 30.9 pg (ref 26.0–34.0)
MONO#: 0.3 10*3/uL (ref 0.1–0.9)
MONO%: 6.4 % (ref 0.0–13.0)
NEUT#: 1.8 10*3/uL (ref 1.5–6.5)
Platelets: 155 10*3/uL (ref 145–400)
RBC: 2.76 10*6/uL — ABNORMAL LOW (ref 3.70–5.32)
WBC: 4.1 10*3/uL (ref 3.9–10.0)

## 2010-02-09 LAB — CMP (CANCER CENTER ONLY)
AST: 25 U/L (ref 11–38)
Albumin: 3.6 g/dL (ref 3.3–5.5)
Alkaline Phosphatase: 166 U/L — ABNORMAL HIGH (ref 26–84)
Glucose, Bld: 211 mg/dL — ABNORMAL HIGH (ref 73–118)
Potassium: 5.5 mEq/L — ABNORMAL HIGH (ref 3.3–4.7)
Sodium: 136 mEq/L (ref 128–145)
Total Protein: 6.6 g/dL (ref 6.4–8.1)

## 2010-02-09 LAB — IRON AND TIBC: Iron: 124 ug/dL (ref 42–145)

## 2010-02-10 ENCOUNTER — Telehealth: Payer: Self-pay | Admitting: Family Medicine

## 2010-02-21 ENCOUNTER — Ambulatory Visit: Payer: Self-pay | Admitting: Family Medicine

## 2010-02-21 DIAGNOSIS — K5289 Other specified noninfective gastroenteritis and colitis: Secondary | ICD-10-CM

## 2010-02-24 ENCOUNTER — Telehealth: Payer: Self-pay | Admitting: Internal Medicine

## 2010-02-28 ENCOUNTER — Telehealth: Payer: Self-pay | Admitting: Family Medicine

## 2010-02-28 DIAGNOSIS — R252 Cramp and spasm: Secondary | ICD-10-CM

## 2010-03-01 ENCOUNTER — Encounter: Payer: Self-pay | Admitting: Family Medicine

## 2010-03-01 ENCOUNTER — Telehealth: Payer: Self-pay | Admitting: Family Medicine

## 2010-03-01 ENCOUNTER — Ambulatory Visit: Payer: Self-pay | Admitting: Family Medicine

## 2010-03-02 ENCOUNTER — Ambulatory Visit: Payer: Self-pay | Admitting: Family Medicine

## 2010-03-02 DIAGNOSIS — E875 Hyperkalemia: Secondary | ICD-10-CM | POA: Insufficient documentation

## 2010-03-02 LAB — CONVERTED CEMR LAB
Calcium: 8.8 mg/dL (ref 8.4–10.5)
Creatinine, Ser: 1.4 mg/dL — ABNORMAL HIGH (ref 0.4–1.2)
Glucose, Bld: 445 mg/dL

## 2010-03-06 LAB — CONVERTED CEMR LAB
ALT: 23 units/L (ref 0–35)
Albumin: 3.9 g/dL (ref 3.5–5.2)
BUN: 31 mg/dL — ABNORMAL HIGH (ref 6–23)
Bilirubin, Direct: 0.1 mg/dL (ref 0.0–0.3)
CO2: 24 meq/L (ref 19–32)
Chloride: 110 meq/L (ref 96–112)
Creatinine, Ser: 1.3 mg/dL — ABNORMAL HIGH (ref 0.4–1.2)
Eosinophils Absolute: 0.1 10*3/uL (ref 0.0–0.7)
Eosinophils Relative: 3.6 % (ref 0.0–5.0)
Ferritin: 294.8 ng/mL — ABNORMAL HIGH (ref 10.0–291.0)
Glucose, Bld: 547 mg/dL (ref 70–99)
MCHC: 32.5 g/dL (ref 30.0–36.0)
MCV: 99.6 fL (ref 78.0–100.0)
Monocytes Absolute: 0.2 10*3/uL (ref 0.1–1.0)
Neutrophils Relative %: 55.6 % (ref 43.0–77.0)
Platelets: 196 10*3/uL (ref 150.0–400.0)
Total Protein: 6.5 g/dL (ref 6.0–8.3)
WBC: 3.2 10*3/uL — ABNORMAL LOW (ref 4.5–10.5)

## 2010-03-07 ENCOUNTER — Ambulatory Visit: Payer: Self-pay | Admitting: Oncology

## 2010-03-08 LAB — CONVERTED CEMR LAB: Vit D, 25-Hydroxy: 36 ng/mL (ref 30–89)

## 2010-03-09 ENCOUNTER — Ambulatory Visit: Payer: Self-pay | Admitting: Family Medicine

## 2010-03-09 LAB — CONVERTED CEMR LAB: Blood Glucose, Fingerstick: 43

## 2010-03-10 ENCOUNTER — Telehealth (INDEPENDENT_AMBULATORY_CARE_PROVIDER_SITE_OTHER): Payer: Self-pay | Admitting: *Deleted

## 2010-03-10 LAB — CBC WITH DIFFERENTIAL (CANCER CENTER ONLY)
BASO%: 0.4 % (ref 0.0–2.0)
EOS%: 3.7 % (ref 0.0–7.0)
MCH: 31.4 pg (ref 26.0–34.0)
MCHC: 33.1 g/dL (ref 32.0–36.0)
MONO%: 5 % (ref 0.0–13.0)
NEUT#: 2.4 10*3/uL (ref 1.5–6.5)
Platelets: 133 10*3/uL — ABNORMAL LOW (ref 145–400)
RDW: 13.9 % (ref 10.5–14.6)

## 2010-03-10 LAB — CONVERTED CEMR LAB
Calcium: 9 mg/dL (ref 8.4–10.5)
GFR calc non Af Amer: 60.05 mL/min (ref >60)
Sodium: 145 meq/L (ref 135–145)

## 2010-03-13 ENCOUNTER — Ambulatory Visit: Payer: Self-pay | Admitting: Family Medicine

## 2010-03-14 ENCOUNTER — Telehealth: Payer: Self-pay | Admitting: Family Medicine

## 2010-03-15 LAB — CONVERTED CEMR LAB
Chloride: 114 meq/L — ABNORMAL HIGH (ref 96–112)
Potassium: 6 meq/L (ref 3.5–5.1)

## 2010-03-16 ENCOUNTER — Telehealth: Payer: Self-pay | Admitting: Family Medicine

## 2010-03-21 ENCOUNTER — Telehealth (INDEPENDENT_AMBULATORY_CARE_PROVIDER_SITE_OTHER): Payer: Self-pay | Admitting: *Deleted

## 2010-03-22 ENCOUNTER — Telehealth: Payer: Self-pay | Admitting: Family Medicine

## 2010-03-23 ENCOUNTER — Telehealth: Payer: Self-pay | Admitting: Family Medicine

## 2010-03-23 ENCOUNTER — Ambulatory Visit: Payer: Self-pay | Admitting: Family Medicine

## 2010-03-24 LAB — CONVERTED CEMR LAB
CO2: 24 meq/L (ref 19–32)
GFR calc non Af Amer: 53.78 mL/min (ref >60)
Glucose, Bld: 144 mg/dL — ABNORMAL HIGH (ref 70–99)
Potassium: 6 meq/L (ref 3.5–5.1)
Sodium: 141 meq/L (ref 135–145)

## 2010-03-30 ENCOUNTER — Telehealth: Payer: Self-pay | Admitting: Family Medicine

## 2010-03-31 ENCOUNTER — Ambulatory Visit: Payer: Self-pay | Admitting: Oncology

## 2010-04-04 ENCOUNTER — Telehealth: Payer: Self-pay | Admitting: Family Medicine

## 2010-04-06 ENCOUNTER — Ambulatory Visit: Payer: Self-pay | Admitting: Family Medicine

## 2010-04-06 DIAGNOSIS — G4700 Insomnia, unspecified: Secondary | ICD-10-CM | POA: Insufficient documentation

## 2010-04-07 ENCOUNTER — Telehealth: Payer: Self-pay | Admitting: Family Medicine

## 2010-04-07 LAB — CBC WITH DIFFERENTIAL (CANCER CENTER ONLY)
BASO#: 0 10*3/uL (ref 0.0–0.2)
BASO%: 0.2 % (ref 0.0–2.0)
EOS%: 8 % — ABNORMAL HIGH (ref 0.0–7.0)
HCT: 32 % — ABNORMAL LOW (ref 34.8–46.6)
HGB: 10.8 g/dL — ABNORMAL LOW (ref 11.6–15.9)
MCH: 32.5 pg (ref 26.0–34.0)
MCHC: 33.7 g/dL (ref 32.0–36.0)
MONO%: 5.2 % (ref 0.0–13.0)
NEUT%: 50.4 % (ref 39.6–80.0)
RDW: 12 % (ref 10.5–14.6)

## 2010-04-07 LAB — CONVERTED CEMR LAB
BUN: 47 mg/dL — ABNORMAL HIGH (ref 6–23)
CO2: 26 meq/L (ref 19–32)
Chloride: 113 meq/L — ABNORMAL HIGH (ref 96–112)
Creatinine, Ser: 1.1 mg/dL (ref 0.4–1.2)

## 2010-04-10 ENCOUNTER — Telehealth: Payer: Self-pay | Admitting: Family Medicine

## 2010-04-12 ENCOUNTER — Telehealth: Payer: Self-pay | Admitting: Family Medicine

## 2010-04-13 ENCOUNTER — Ambulatory Visit: Payer: Self-pay | Admitting: Family Medicine

## 2010-04-14 ENCOUNTER — Telehealth: Payer: Self-pay | Admitting: Family Medicine

## 2010-04-14 LAB — CONVERTED CEMR LAB
BUN: 21 mg/dL (ref 6–23)
CO2: 25 meq/L (ref 19–32)
GFR calc non Af Amer: 53.77 mL/min (ref >60)
Glucose, Bld: 205 mg/dL — ABNORMAL HIGH (ref 70–99)
Potassium: 5.6 meq/L — ABNORMAL HIGH (ref 3.5–5.1)

## 2010-04-20 ENCOUNTER — Ambulatory Visit: Payer: Self-pay | Admitting: Family Medicine

## 2010-04-24 ENCOUNTER — Telehealth: Payer: Self-pay | Admitting: Family Medicine

## 2010-04-25 LAB — CONVERTED CEMR LAB
BUN: 31 mg/dL — ABNORMAL HIGH (ref 6–23)
CO2: 25 meq/L (ref 19–32)
Calcium: 8.9 mg/dL (ref 8.4–10.5)
Creatinine, Ser: 1.3 mg/dL — ABNORMAL HIGH (ref 0.4–1.2)

## 2010-04-26 ENCOUNTER — Encounter: Payer: Self-pay | Admitting: Family Medicine

## 2010-04-27 ENCOUNTER — Encounter: Payer: Self-pay | Admitting: Family Medicine

## 2010-05-01 ENCOUNTER — Ambulatory Visit: Payer: Self-pay | Admitting: Oncology

## 2010-05-02 ENCOUNTER — Ambulatory Visit: Payer: Self-pay | Admitting: Family Medicine

## 2010-05-02 DIAGNOSIS — L0291 Cutaneous abscess, unspecified: Secondary | ICD-10-CM

## 2010-05-02 DIAGNOSIS — L0390 Cellulitis, unspecified: Secondary | ICD-10-CM

## 2010-05-02 HISTORY — DX: Cutaneous abscess, unspecified: L02.91

## 2010-05-03 ENCOUNTER — Telehealth: Payer: Self-pay | Admitting: Family Medicine

## 2010-05-04 ENCOUNTER — Telehealth: Payer: Self-pay | Admitting: Family Medicine

## 2010-05-04 DIAGNOSIS — S0990XA Unspecified injury of head, initial encounter: Secondary | ICD-10-CM | POA: Insufficient documentation

## 2010-05-10 ENCOUNTER — Ambulatory Visit: Payer: Self-pay | Admitting: Internal Medicine

## 2010-05-11 LAB — CBC WITH DIFFERENTIAL (CANCER CENTER ONLY)
BASO#: 0 10*3/uL (ref 0.0–0.2)
EOS%: 4 % (ref 0.0–7.0)
HCT: 38.5 % (ref 34.8–46.6)
HGB: 13.1 g/dL (ref 11.6–15.9)
LYMPH#: 1.6 10*3/uL (ref 0.9–3.3)
LYMPH%: 34.9 % (ref 14.0–48.0)
MCHC: 34 g/dL (ref 32.0–36.0)
MCV: 94 fL (ref 81–101)
MONO#: 0.3 10*3/uL (ref 0.1–0.9)
NEUT%: 54.7 % (ref 39.6–80.0)
RDW: 12 % (ref 10.5–14.6)

## 2010-05-17 ENCOUNTER — Telehealth: Payer: Self-pay | Admitting: Family Medicine

## 2010-05-26 ENCOUNTER — Ambulatory Visit: Payer: Self-pay | Admitting: Oncology

## 2010-05-31 ENCOUNTER — Encounter: Payer: Self-pay | Admitting: Family Medicine

## 2010-05-31 LAB — CBC WITH DIFFERENTIAL (CANCER CENTER ONLY)
BASO#: 0 10*3/uL (ref 0.0–0.2)
Eosinophils Absolute: 0.1 10*3/uL (ref 0.0–0.5)
HCT: 35 % (ref 34.8–46.6)
HGB: 11.5 g/dL — ABNORMAL LOW (ref 11.6–15.9)
LYMPH#: 1.4 10*3/uL (ref 0.9–3.3)
MCHC: 32.7 g/dL (ref 32.0–36.0)
MONO#: 0.2 10*3/uL (ref 0.1–0.9)
NEUT#: 1.8 10*3/uL (ref 1.5–6.5)
NEUT%: 51.5 % (ref 39.6–80.0)
RBC: 3.76 10*6/uL (ref 3.70–5.32)

## 2010-06-06 ENCOUNTER — Telehealth: Payer: Self-pay | Admitting: Family Medicine

## 2010-06-07 ENCOUNTER — Encounter: Payer: Self-pay | Admitting: Family Medicine

## 2010-06-08 ENCOUNTER — Telehealth: Payer: Self-pay | Admitting: Family Medicine

## 2010-06-19 ENCOUNTER — Ambulatory Visit: Payer: Self-pay | Admitting: Oncology

## 2010-06-19 ENCOUNTER — Telehealth: Payer: Self-pay | Admitting: Family Medicine

## 2010-06-23 ENCOUNTER — Telehealth: Payer: Self-pay | Admitting: Family Medicine

## 2010-06-29 ENCOUNTER — Encounter (HOSPITAL_COMMUNITY): Admission: RE | Admit: 2010-06-29 | Discharge: 2010-06-29 | Payer: Self-pay | Admitting: Oncology

## 2010-06-29 LAB — CBC WITH DIFFERENTIAL (CANCER CENTER ONLY)
BASO#: 0 10*3/uL (ref 0.0–0.2)
Eosinophils Absolute: 0.2 10*3/uL (ref 0.0–0.5)
HCT: 25 % — ABNORMAL LOW (ref 34.8–46.6)
HGB: 8.6 g/dL — ABNORMAL LOW (ref 11.6–15.9)
LYMPH%: 39.9 % (ref 14.0–48.0)
MCH: 31 pg (ref 26.0–34.0)
MCV: 90 fL (ref 81–101)
MONO#: 0.3 10*3/uL (ref 0.1–0.9)
MONO%: 6.9 % (ref 0.0–13.0)
NEUT%: 48.6 % (ref 39.6–80.0)
Platelets: 160 10*3/uL (ref 145–400)
RBC: 2.76 10*6/uL — ABNORMAL LOW (ref 3.70–5.32)
WBC: 3.6 10*3/uL — ABNORMAL LOW (ref 3.9–10.0)

## 2010-07-04 LAB — TYPE & CROSSMATCH - CHCC SATELLITE

## 2010-07-05 ENCOUNTER — Telehealth: Payer: Self-pay | Admitting: Family Medicine

## 2010-07-05 ENCOUNTER — Ambulatory Visit: Payer: Self-pay | Admitting: Family Medicine

## 2010-07-05 DIAGNOSIS — M1990 Unspecified osteoarthritis, unspecified site: Secondary | ICD-10-CM | POA: Insufficient documentation

## 2010-07-21 ENCOUNTER — Encounter: Payer: Self-pay | Admitting: Family Medicine

## 2010-07-25 ENCOUNTER — Ambulatory Visit: Payer: Self-pay | Admitting: Oncology

## 2010-07-28 LAB — CBC WITH DIFFERENTIAL (CANCER CENTER ONLY)
BASO%: 0.5 % (ref 0.0–2.0)
EOS%: 4.6 % (ref 0.0–7.0)
HCT: 34.5 % — ABNORMAL LOW (ref 34.8–46.6)
HGB: 11.6 g/dL (ref 11.6–15.9)
LYMPH#: 1.7 10*3/uL (ref 0.9–3.3)
MCHC: 33.7 g/dL (ref 32.0–36.0)
MONO#: 0.3 10*3/uL (ref 0.1–0.9)
NEUT#: 1.9 10*3/uL (ref 1.5–6.5)
NEUT%: 45.8 % (ref 39.6–80.0)
RDW: 14.8 % — ABNORMAL HIGH (ref 10.5–14.6)
WBC: 4.1 10*3/uL (ref 3.9–10.0)

## 2010-07-31 ENCOUNTER — Telehealth: Payer: Self-pay | Admitting: Family Medicine

## 2010-08-01 ENCOUNTER — Telehealth: Payer: Self-pay | Admitting: Family Medicine

## 2010-08-02 ENCOUNTER — Telehealth: Payer: Self-pay | Admitting: Family Medicine

## 2010-08-03 ENCOUNTER — Encounter: Payer: Self-pay | Admitting: Family Medicine

## 2010-08-23 ENCOUNTER — Telehealth: Payer: Self-pay | Admitting: Family Medicine

## 2010-08-24 ENCOUNTER — Telehealth: Payer: Self-pay | Admitting: Family Medicine

## 2010-08-24 ENCOUNTER — Ambulatory Visit: Payer: Self-pay | Admitting: Oncology

## 2010-08-25 LAB — CBC WITH DIFFERENTIAL (CANCER CENTER ONLY)
BASO%: 0.1 % (ref 0.0–2.0)
EOS%: 4.7 % (ref 0.0–7.0)
LYMPH#: 1.3 10*3/uL (ref 0.9–3.3)
LYMPH%: 31.1 % (ref 14.0–48.0)
MCHC: 33.9 g/dL (ref 32.0–36.0)
MONO#: 0.2 10*3/uL (ref 0.1–0.9)
Platelets: 153 10*3/uL (ref 145–400)
RDW: 13.9 % (ref 10.5–14.6)
WBC: 4.3 10*3/uL (ref 3.9–10.0)

## 2010-09-05 ENCOUNTER — Telehealth: Payer: Self-pay | Admitting: Family Medicine

## 2010-09-07 ENCOUNTER — Telehealth: Payer: Self-pay | Admitting: Family Medicine

## 2010-09-13 ENCOUNTER — Telehealth: Payer: Self-pay | Admitting: Family Medicine

## 2010-09-14 ENCOUNTER — Ambulatory Visit: Payer: Self-pay | Admitting: Oncology

## 2010-09-22 LAB — CBC WITH DIFFERENTIAL (CANCER CENTER ONLY)
BASO#: 0 10*3/uL (ref 0.0–0.2)
Eosinophils Absolute: 0.1 10*3/uL (ref 0.0–0.5)
HGB: 11.1 g/dL — ABNORMAL LOW (ref 11.6–15.9)
MCH: 32 pg (ref 26.0–34.0)
MCV: 96 fL (ref 81–101)
MONO%: 6.2 % (ref 0.0–13.0)
NEUT#: 2 10*3/uL (ref 1.5–6.5)
RBC: 3.46 10*6/uL — ABNORMAL LOW (ref 3.70–5.32)

## 2010-09-26 LAB — FERRITIN: Ferritin: 569 ng/mL — ABNORMAL HIGH (ref 10–291)

## 2010-09-26 LAB — SPEP & IFE WITH QIG
Albumin ELP: 63.4 % (ref 55.8–66.1)
Alpha-1-Globulin: 4 % (ref 2.9–4.9)
Alpha-2-Globulin: 9 % (ref 7.1–11.8)
Gamma Globulin: 15.9 % (ref 11.1–18.8)
Total Protein, Serum Electrophoresis: 6.9 g/dL (ref 6.0–8.3)

## 2010-09-26 LAB — IRON AND TIBC
%SAT: 69 % — ABNORMAL HIGH (ref 20–55)
UIBC: 69 ug/dL

## 2010-09-26 LAB — KAPPA/LAMBDA LIGHT CHAINS: Kappa:Lambda Ratio: 3.74 — ABNORMAL HIGH (ref 0.26–1.65)

## 2010-09-26 LAB — BETA 2 MICROGLOBULIN, SERUM: Beta-2 Microglobulin: 3.76 mg/L — ABNORMAL HIGH (ref 1.01–1.73)

## 2010-09-28 LAB — UIFE/LIGHT CHAINS/TP QN, 24-HR UR
Albumin, U: DETECTED
Free Lambda Excretion/Day: 2.88 mg/d
Free Lambda Lt Chains,Ur: 0.23 mg/dL (ref 0.08–1.01)
Free Lt Chn Excr Rate: 125 mg/d
Time: 24 hours
Total Protein, Urine-Ur/day: 134 mg/d (ref 10–140)
Total Protein, Urine: 10.7 mg/dL
Volume, Urine: 1250 mL

## 2010-10-02 ENCOUNTER — Telehealth: Payer: Self-pay | Admitting: Family Medicine

## 2010-10-03 ENCOUNTER — Telehealth: Payer: Self-pay | Admitting: Family Medicine

## 2010-10-18 ENCOUNTER — Ambulatory Visit: Payer: Self-pay | Admitting: Oncology

## 2010-10-24 LAB — CBC WITH DIFFERENTIAL (CANCER CENTER ONLY)
BASO#: 0 10*3/uL (ref 0.0–0.2)
HCT: 26.1 % — ABNORMAL LOW (ref 34.8–46.6)
HGB: 9 g/dL — ABNORMAL LOW (ref 11.6–15.9)
LYMPH#: 1.4 10*3/uL (ref 0.9–3.3)
MCHC: 34.6 g/dL (ref 32.0–36.0)
MCV: 93 fL (ref 81–101)
MONO#: 0.2 10*3/uL (ref 0.1–0.9)
NEUT%: 55.6 % (ref 39.6–80.0)

## 2010-11-01 ENCOUNTER — Telehealth: Payer: Self-pay | Admitting: Family Medicine

## 2010-11-02 ENCOUNTER — Telehealth: Payer: Self-pay | Admitting: Family Medicine

## 2010-11-03 ENCOUNTER — Telehealth: Payer: Self-pay | Admitting: Family Medicine

## 2010-11-06 ENCOUNTER — Encounter (INDEPENDENT_AMBULATORY_CARE_PROVIDER_SITE_OTHER): Payer: Self-pay | Admitting: *Deleted

## 2010-11-07 ENCOUNTER — Telehealth: Payer: Self-pay | Admitting: Family Medicine

## 2010-11-20 ENCOUNTER — Ambulatory Visit: Payer: Self-pay | Admitting: Oncology

## 2010-11-22 ENCOUNTER — Telehealth: Payer: Self-pay | Admitting: Family Medicine

## 2010-11-22 LAB — CBC WITH DIFFERENTIAL/PLATELET
Basophils Absolute: 0 10*3/uL (ref 0.0–0.1)
Eosinophils Absolute: 0.1 10*3/uL (ref 0.0–0.5)
HCT: 31.1 % — ABNORMAL LOW (ref 34.8–46.6)
LYMPH%: 34.5 % (ref 14.0–49.7)
MONO#: 0.2 10*3/uL (ref 0.1–0.9)
NEUT#: 1.9 10*3/uL (ref 1.5–6.5)
NEUT%: 56.1 % (ref 38.4–76.8)
Platelets: 131 10*3/uL — ABNORMAL LOW (ref 145–400)
WBC: 3.5 10*3/uL — ABNORMAL LOW (ref 3.9–10.3)

## 2010-11-27 LAB — COMPREHENSIVE METABOLIC PANEL
ALT: 29 U/L (ref 0–35)
Albumin: 4.4 g/dL (ref 3.5–5.2)
Alkaline Phosphatase: 167 U/L — ABNORMAL HIGH (ref 39–117)
CO2: 22 mEq/L (ref 19–32)
Glucose, Bld: 226 mg/dL — ABNORMAL HIGH (ref 70–99)
Potassium: 5 mEq/L (ref 3.5–5.3)
Sodium: 142 mEq/L (ref 135–145)
Total Protein: 6.6 g/dL (ref 6.0–8.3)

## 2010-11-27 LAB — SPEP & IFE WITH QIG
Alpha-1-Globulin: 3.9 % (ref 2.9–4.9)
Alpha-2-Globulin: 8.8 % (ref 7.1–11.8)
Total Protein, Serum Electrophoresis: 6.6 g/dL (ref 6.0–8.3)

## 2010-12-01 ENCOUNTER — Telehealth: Payer: Self-pay | Admitting: Family Medicine

## 2010-12-04 ENCOUNTER — Telehealth: Payer: Self-pay | Admitting: Family Medicine

## 2010-12-06 ENCOUNTER — Encounter: Payer: Self-pay | Admitting: Family Medicine

## 2010-12-11 ENCOUNTER — Telehealth: Payer: Self-pay | Admitting: Family Medicine

## 2010-12-29 ENCOUNTER — Telehealth: Payer: Self-pay | Admitting: Family Medicine

## 2010-12-29 ENCOUNTER — Encounter: Payer: Self-pay | Admitting: Family Medicine

## 2011-01-02 ENCOUNTER — Telehealth: Payer: Self-pay | Admitting: Family Medicine

## 2011-01-02 ENCOUNTER — Encounter: Payer: Self-pay | Admitting: Family Medicine

## 2011-01-08 ENCOUNTER — Telehealth: Payer: Self-pay | Admitting: Family Medicine

## 2011-01-12 ENCOUNTER — Telehealth: Payer: Self-pay | Admitting: Family Medicine

## 2011-01-15 ENCOUNTER — Telehealth: Payer: Self-pay | Admitting: Family Medicine

## 2011-01-16 ENCOUNTER — Telehealth: Payer: Self-pay | Admitting: Family Medicine

## 2011-01-17 ENCOUNTER — Ambulatory Visit: Payer: Self-pay | Admitting: Oncology

## 2011-01-19 LAB — CBC WITH DIFFERENTIAL/PLATELET
BASO%: 0.2 % (ref 0.0–2.0)
Basophils Absolute: 0 10*3/uL (ref 0.0–0.1)
EOS%: 3.1 % (ref 0.0–7.0)
Eosinophils Absolute: 0.2 10*3/uL (ref 0.0–0.5)
HCT: 30.2 % — ABNORMAL LOW (ref 34.8–46.6)
HGB: 9.9 g/dL — ABNORMAL LOW (ref 11.6–15.9)
LYMPH%: 38.2 % (ref 14.0–49.7)
MCH: 30.9 pg (ref 25.1–34.0)
MCHC: 32.8 g/dL (ref 31.5–36.0)
MCV: 94.4 fL (ref 79.5–101.0)
MONO#: 0.4 10*3/uL (ref 0.1–0.9)
MONO%: 6.7 % (ref 0.0–14.0)
NEUT#: 3 10*3/uL (ref 1.5–6.5)
NEUT%: 51.8 % (ref 38.4–76.8)
Platelets: 161 10*3/uL (ref 145–400)
RBC: 3.2 10*6/uL — ABNORMAL LOW (ref 3.70–5.45)
RDW: 13 % (ref 11.2–14.5)
WBC: 5.8 10*3/uL (ref 3.9–10.3)
lymph#: 2.2 10*3/uL (ref 0.9–3.3)

## 2011-01-19 LAB — WHOLE BLOOD GLUCOSE
Glucose: 51 mg/dL — ABNORMAL LOW (ref 70–100)
HRS PC: 4 Hours

## 2011-01-21 ENCOUNTER — Encounter: Payer: Self-pay | Admitting: Family Medicine

## 2011-01-22 ENCOUNTER — Encounter: Payer: Self-pay | Admitting: Oncology

## 2011-01-25 ENCOUNTER — Ambulatory Visit: Admit: 2011-01-25 | Payer: Self-pay | Admitting: Family Medicine

## 2011-01-30 ENCOUNTER — Ambulatory Visit: Admit: 2011-01-30 | Payer: Self-pay | Admitting: Family Medicine

## 2011-01-30 NOTE — Progress Notes (Signed)
Summary: SERTRALINE/ TOPAMAX  Phone Note Refill Request Message from:  [place]  on January 11, 2010 1:21 PM  Refills Requested: Medication #1:  SERTRALINE HCL 25 MG TABS 1 tab by mouth daily   Last Refilled: 12/02/2009  Medication #2:  TOPAMAX 100 MG TABS 1 tab by mouth two times a day   Last Refilled: 12/05/2009 Form on your desk , Dr. Dayton Martes patient.   Method Requested: Fax to Local Pharmacy Initial call taken by: DeShannon Katrinka Blazing CMA [facility]),  January 11, 2010 1:26 PM  Follow-up for Phone Call        Rx completed in Dr. Tiajuana Amass Follow-up by: Cindee Salt MD,  January 11, 2010 1:49 PM    New/Updated Medications: SERTRALINE HCL 25 MG TABS (SERTRALINE HCL) 1 tab by mouth daily Prescriptions: TOPAMAX 100 MG TABS (TOPIRAMATE) 1 tab by mouth two times a day  #30 x 5   Entered and Authorized by:   Cindee Salt MD   Signed by:   Cindee Salt MD on 01/11/2010   Method used:   Electronically to        [facility]* (retail)       [address]       [place], [place]  [address]       Ph: [phone]       Fax: [phone]   RxID:   [id number] SERTRALINE HCL 25 MG TABS (SERTRALINE HCL) 1 tab by mouth daily  #30 x 11   Entered and Authorized by:   Cindee Salt MD   Signed by:   Cindee Salt MD on 01/11/2010   Method used:   Electronically to        [facility]* (retail)       [address]       [place], [place]  [address]       Ph: [phone]       Fax: [phone]   RxID:   [id number]

## 2011-01-30 NOTE — Progress Notes (Signed)
Summary: Legs cramping  Phone Note Call from Patient Call back at Home Phone [phone]   Caller: Patient Call For: Ruthe Mannan MD Summary of Call: Patient states she is having trouble with her legs, they are cramping bad.  Says that it cramps so bad that she could cry at times.  No medication changes, says she is just getting over a virus and was drinking Pedialyte so she doesn't think it could be coming from dehydration.  She says she has no other symptoms and wants to know what should she do.  Please advise. Initial call taken by: Linde Gillis CMA [facility]),  February 28, 2010 10:10 AM  Follow-up for Phone Call        On meds that can lower potassium.Marland Kitchenwould recommend coming in for labs.  CMET, TSH,cbc,  ferritin Dx 729.82 Follow-up by: Kerby Nora MD,  February 28, 2010 10:43 AM  Additional Follow-up for Phone Call Additional follow up Details #1::        appt made for tomorrow Additional Follow-up by: Benny Lennert CMA (AAMA),  February 28, 2010 11:04 AM  New Problems: LEG CRAMPS (ICD-729.82)   New Problems: LEG CRAMPS (ICD-729.82)

## 2011-01-30 NOTE — Progress Notes (Signed)
Summary: Rx Alprazolam  Phone Note Refill Request Call back at [phone] Message from:  [facility] on March 16, 2010 2:27 PM  Refills Requested: Medication #1:  ALPRAZOLAM 1 MG TABS 1 tab by mouth three times a day as needed anxiety   Last Refilled: 02/21/2010 Received faxed refill request, please advise.  Dr. Milinda Antis out of the office until Tuesday.     Method Requested: Telephone to Pharmacy Initial call taken by: Linde Gillis CMA [facility]),  March 16, 2010 2:28 PM    Prescriptions: ALPRAZOLAM 1 MG TABS (ALPRAZOLAM) 1 tab by mouth three times a day as needed anxiety  #90 x 0   Entered and Authorized by:   Ruthe Mannan MD   Signed by:   Ruthe Mannan MD on 03/16/2010   Method used:   Handwritten   RxID:   [id number]   Appended Document: Rx Alprazolam Medication phoned to pharmacy.

## 2011-01-30 NOTE — Progress Notes (Signed)
Summary: refill request for tramadol  Phone Note Refill Request Message from:  Fax from Pharmacy  Refills Requested: Medication #1:  TRAMADOL HCL 50 MG  TABS 1 tab by mouth two times a day as needed pain   Last Refilled: 11/02/2010 Faxed request from [facility] [place] [place] pharmacy, [phone].  Initial call taken by: Lowella Petties CMA, AAMA,  December 01, 2010 12:27 PM  Follow-up for Phone Call        Rx called to pharmacy Follow-up by: Benny Lennert CMA [facility]),  December 01, 2010 2:22 PM    Prescriptions: TRAMADOL HCL 50 MG  TABS (TRAMADOL HCL) 1 tab by mouth two times a day as needed pain  #60 x 0   Entered and Authorized by:   Kerby Nora MD   Signed by:   Kerby Nora MD on 12/01/2010   Method used:   Telephoned to ...       [facility]* (retail)       [address]       [place], [place]  [address]       Ph: [phone]       Fax: [phone]   RxID:   [id number]

## 2011-01-30 NOTE — Progress Notes (Signed)
Summary: prior auth denied for aciphex  Phone Note Other Incoming   Caller: Medicaid Summary of Call: Prior auth for aciphex denied, letter is on  your desk. Initial call taken by: Lowella Petties CMA, AAMA,  November 07, 2010 4:44 PM  Follow-up for Phone Call        noted. Ruthe Mannan MD  November 08, 2010 7:43 AM      Appended Document: prior auth denied for aciphex Received denial letter along with appeal form for Aciphex.  Spoke with patient and she has tried Omeprazole and Pantoprazole and either one helped at all.  She says that she also experience severe hair loss when taking those two medications.  Aciphex is the only thing that helps her and that does not make her hair come out.  She would like for [place] to fill out appeal form.  Appended Document: prior auth denied for aciphex ok , please let me know how to proceed.  Appended Document: prior auth denied for aciphex Patient will come by our office to sign the appeal form giving Dr. Dayton Martes permission to speak with Office of Administrative Hearing about Ms. Pearsons case.  Appended Document: prior auth denied for aciphex Patient signed appeal form and form was faxed to [phone], Office of Administrative Hearing.

## 2011-01-30 NOTE — Progress Notes (Signed)
Summary: Mediation Request for Refill-Aciphex  Call back at Home Phone [phone]    Follow-up for Phone Call       Follow-up by: Ruthe Mannan MD,  November 27, 2010 7:53 AM    Additional Follow-up for Phone Call Additional follow up Details #2::    Randa Evens with [employer] called and left a message with front office re: patient's appeal and would like to schedule a mediation with Dr. Dayton Martes.  Mediator left the following dates for possible mediation times - November 28th afternoon, November 29th after 2p.m. and December 1st between 1p.m. and 5p.m.    Request that nurse/doctor call back at [phone] to schedule mediation.  Follow-up by: Clarisa Schools,  November 22, 2010 3:54 PM  Additional Follow-up for Phone Call Additional follow up Details #3:: Details for Additional Follow-up Action Taken: Please call this number and ask for a time next week. Ruthe Mannan MD  November 27, 2010 7:54 AM  Spoke with Randa Evens and she will talk with Adele Dan who is the Medicaid rep that Dr. Dayton Martes will be speaking with and see what time next week she is available.  Noelle Penner of Dr. Elmer Sow schedule and what morning/afternoon she is out of the office or comes in late.  Randa Evens will call me back.  Linde Gillis CMA [facility])  November 27, 2010 10:22 AM   Randa Evens called back and left a message on my voicemail stating that a rep. will be available on Monday December 04, 2010 at 10:00 am to speak with Dr. Dayton Martes regarding Nicholes Stairs.  The phone number to call is [phone] code [phone].  We can call Randa Evens at [phone] if we need to.  Linde Gillis CMA [facility])  November 28, 2010 9:56 AM   Ok please block 10:45 and let her know I will call as soon as I have a chance. Ruthe Mannan MD  December 04, 2010 7:54 AM  Randa Evens called back and she rescheduled from 10:00am today to 12:30 today.  Ok per Dr. Dayton Martes.  Linde Gillis CMA [facility])  December 04, 2010 9:21 AM   Spoke with Amy, approved Aciphex until  12/04/2011.  She can pick it up today. Ruthe Mannan MD  December 04, 2010 12:35 PM  Patient and pharmacy notified that Aciphex has been approved. Additional Follow-up by: Linde Gillis CMA (AAMA),  December 04, 2010 1:10 PM

## 2011-01-30 NOTE — Progress Notes (Signed)
Summary: pt requests phone call  Phone Note Call from Patient Call back at Home Phone [phone]   Caller: Patient Call For: Ruthe Mannan MD Summary of Call: Pt states you had written her a letter asking that she be given a parking space in front of her appt.  They have denied this and pt wants to discuss this with you.  She is asking that you call her back personally.  She also said there is something else that she wants to discuss with you.  She said that you told her that if she ever has a problem she can call the office and speak with you directly. Initial call taken by: Lowella Petties CMA,  August 02, 2010 9:23 AM  Follow-up for Phone Call        I cannot call her today.  I will make time to call her back tomorrow (please block a 15 min appt) so I can discuss with her. Thanks, Windell Moulding MD  August 02, 2010 9:26 AM  2:45  blocked on 08/03/2010 so you can have time to call this patient. Follow-up by: Linde Gillis CMA [facility]),  August 02, 2010 11:49 AM  Additional Follow-up for Phone Call Additional follow up Details #1::        Spoke with pt, letter written. She will come pick up letter. Ruthe Mannan MD  August 03, 2010 9:01 AM  Letter left up front for patient to pick up.  Additional Follow-up by: Linde Gillis CMA [facility]),  August 03, 2010 9:05 AM

## 2011-01-30 NOTE — Progress Notes (Signed)
Summary: ketoconazol  Phone Note Refill Request Message from:  Pharmacy on August 23, 2010 10:32 AM  Refills Requested: Medication #1:  KETOCONAZOLE 2 % SHAM Use as directed.   Last Refilled: 04/12/2010 Refill request from [facility] pharmacy. [phone].  Initial call taken by: Melody Comas,  August 23, 2010 10:33 AM    Prescriptions: KETOCONAZOLE 2 % SHAM (KETOCONAZOLE) Use as directed.  #1 x 0   Entered and Authorized by:   Ruthe Mannan MD   Signed by:   Ruthe Mannan MD on 08/23/2010   Method used:   Electronically to        [facility]* (retail)       [address]       [place], [place]  [address]       Ph: [phone]       Fax: [phone]   RxID:   [id number]

## 2011-01-30 NOTE — Progress Notes (Signed)
Summary: Promethazine 25mg  refill  Phone Note Refill Request Call back at [phone] Message from:  [facility] on February 06, 2010 4:37 PM  Refills Requested: Medication #1:  PROMETHAZINE HCL 25 MG TABS 1/2-1 tab by mouth q 6 hrs as needed for nausea   Last Refilled: 12/12/2009 [facility] faxed refill request.Please advise.    Method Requested: Electronic Initial call taken by: Lewanda Rife LPN,  February 06, 2010 4:37 PM    Prescriptions: PROMETHAZINE HCL 25 MG TABS (PROMETHAZINE HCL) 1/2-1 tab by mouth q 6 hrs as needed for nausea  #30 Tablet x 0   Entered and Authorized by:   Ruthe Mannan MD   Signed by:   Ruthe Mannan MD on 02/07/2010   Method used:   Electronically to        [facility]* (retail)       [address]       [place], [place]  [address]       Ph: [phone]       Fax: [phone]   RxID:   [id number]

## 2011-01-30 NOTE — Progress Notes (Signed)
Summary: wants to change insulin  Phone Note Call from Patient   Caller: Patient Call For: Ruthe Mannan MD Summary of Call: Pt is asking to change from insulin solution to pens because of problems with her vision.  She doesnt need this right now, has enough insulin to last till the first of the month.  She will call when ready. Initial call taken by: Lowella Petties CMA,  September 13, 2010 11:45 AM  Follow-up for Phone Call        ok.  thank you. Ruthe Mannan MD  September 13, 2010 11:46 AM

## 2011-01-30 NOTE — Progress Notes (Signed)
Summary: prior auth needed for aciphex  Phone Note From Pharmacy   Caller: [facility]*? Medicaid Summary of Call: Prior auth is needed for aciphex, form is on your desk. Initial call taken by: Lowella Petties CMA, AAMA,  November 03, 2010 2:13 PM  Follow-up for Phone Call        on my desk. Ruthe Mannan MD  November 03, 2010 3:41 PM Form faxed.         Lowella Petties CMA, AAMA  November 03, 2010 3:44 PM   Additional Follow-up for Phone Call Additional follow up Details #1::        Medicaid has sent a different form to be filled out, form is on your desk.   Lowella Petties CMA, AAMA  November 06, 2010 11:37 AM     Additional Follow-up for Phone Call Additional follow up Details #2::    On my desk. Ruthe Mannan MD  November 06, 2010 1:11 PM  Form faxed. Follow-up by: Lowella Petties CMA, AAMA,  November 06, 2010 2:27 PM

## 2011-01-30 NOTE — Progress Notes (Signed)
Summary: needs new script for novolog  Phone Note Refill Request Message from:  Fax from Pharmacy  Refills Requested: Medication #1:  NOVOLOG 100 UNIT/ML SOLN use sliding scale with meals   Last Refilled: 04/01/2010 Faxed request from [facility] pharmacy.  They are asking for specific directions- cannot say use as directed.  Initial call taken by: Lowella Petties CMA,  June 19, 2010 10:06 AM  Follow-up for Phone Call        please find out how pt taking it so we can send script in Ruthe Mannan MD  June 19, 2010 10:31 AM  Patient checks her blood sugar before she eats and adjust her doses as needed.  She uses the Novolog up to 5 times daily, 4 to 14 units.  Please advise.  Linde Gillis CMA [facility])  June 19, 2010 10:44 AM      New/Updated Medications: NOVOLOG 100 UNIT/ML SOLN (INSULIN ASPART) use sliding scale with meals- (4- 14 units four times daily depending on CBG) Prescriptions: NOVOLOG 100 UNIT/ML SOLN (INSULIN ASPART) use sliding scale with meals- (4- 14 units four times daily depending on CBG)  #1 x 3   Entered and Authorized by:   Ruthe Mannan MD   Signed by:   Ruthe Mannan MD on 06/19/2010   Method used:   Electronically to        [facility]* (retail)       [address]       [place], [place]  [address]       Ph: [phone]       Fax: [phone]   RxID:   [id number]
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
## 2011-01-30 NOTE — Progress Notes (Signed)
Summary: wants to change from lisinopril  Phone Note Call from Patient Call back at Home Phone [phone]   Caller: Patient Call For: Ruthe Mannan MD Summary of Call: Pt states her pharmacist told her that lisinopril could be messing up her potassium, she wants to be changed to something else.  Uses [facility].  She said she thinks she will have a heart attack if she continues on lisinopril. Initial call taken by: Lowella Petties CMA,  March 30, 2010 12:53 PM  Follow-up for Phone Call        We can stop her lisinopril although I feel its more related to her brittle diabetes, sometimes lisinopril can aggrevate the situation.  Start Hydralazine.  I will call into her pharmacy.  Please have her come see me next week to follow up blood pressure and potassium. Follow-up by: Ruthe Mannan MD,  March 30, 2010 12:56 PM  Additional Follow-up for Phone Call Additional follow up Details #1::        Patient Advised. Appointment scheduled  04/06/2010 Additional Follow-up by: Delilah Shan CMA [facility]),  March 30, 2010 3:39 PM    New/Updated Medications: HYDRALAZINE HCL 10 MG TABS (HYDRALAZINE HCL) 1 tab by mouth qid Prescriptions: HYDRALAZINE HCL 10 MG TABS (HYDRALAZINE HCL) 1 tab by mouth qid  #120 x 0   Entered and Authorized by:   Ruthe Mannan MD   Signed by:   Ruthe Mannan MD on 03/30/2010   Method used:   Electronically to        [facility]* (retail)       [address]       [place], [place]  [address]       Ph: [phone]       Fax: [phone]   RxID:   [id number]

## 2011-01-30 NOTE — Assessment & Plan Note (Signed)
Summary: FOLLOWUP PER [facility]   Vital Signs:  Patient profile:   60 year old female Height:      63 inches Weight:      120.13 pounds BMI:     21.36 Temp:     97.8 degrees F oral Pulse rate:   72 / minute Pulse rhythm:   regular BP sitting:   222 / 82  (left arm) Cuff size:   regular  Vitals Entered By: Delilah Shan CMA Duncan Dull) (March 09, 2010 2:43 PM)  Serial Vital Signs/Assessments:  Time      Position  BP       Pulse  Resp  Temp     By                     162/78                         Ruthe Mannan MD   CC: F/U per Dr. Salena Saner CBG Result 43   Rechecked after given OJ and a piece of candy .Marland KitchenMarland KitchenMarland KitchenMarland Kitchen83  History of Present Illness: Here for follow up  elevated CBG and potassium.   Seen by Dr. Patsy Lager last week, BS > 500, K normalizaed s/p kayexalate x 2 doses  Really brittle diabetic. Has been in the hospital in [place] multiple times. CBGs run from 20s-600s.  Typcially asymptomatic when she is hypoglycemia.  Her family does try to keep a CBG log for her, did not bring that in today.  Dr. Patsy Lager would like for her to see endocrinology for pump ginve that she is so brittle.  Discussed with former primary MD, has seen endrocrine in past.  Not a candidtate for pump given her mental status and functional capibilties.    Right now is taking Lantus 15 units a day with novolog sliding scale.  CBG when first here 44, after juice 83.  Asymptomatic.    HTN- BP extremely elevated today.  first check 222/82, Given Clonidine 0/2, came down to 162/78.  did not take BP meds today.  Of note, last week BP was 110/68.  No HA, blurred vision, or SOB.  No focal neurological changes.  Current Medications (verified): 1)  Gabapentin 300 Mg Tabs (Gabapentin) .... Take 2 Tablet By Mouth Three Times A Day 2)  Lantus 100 Unit/ml Soln (Insulin Glargine) .... Inject 15 Unit Subcutaneously Every Am 3)  Novolog 100 Unit/ml Soln (Insulin Aspart) .... Use Sliding Scale With Meals 4)  Sertraline Hcl 25 Mg Tabs  (Sertraline Hcl) .Marland Kitchen.. 1 Tab By Mouth Daily 5)  Simvastatin 20 Mg Tabs (Simvastatin) .... Take 1 Tablet By Mouth At Bedtime 6)  Topamax 100 Mg Tabs (Topiramate) .Marland Kitchen.. 1 Tab By Mouth Two Times A Day 7)  Onetouch Ultrasoft Lancets   Misc (Lancets) .... Use To Check Blood Sugar Every Morning and 2 Hours After Meals 8)  Bd Insulin Syringe Ultrafine 29g X 1/2" 0.5 Ml  Misc (Insulin Syringe-Needle U-100) .... Use As Directed Up To 5 Injections Daily Dispense 2 Boxes 9)  Nitroglycerin 0.3 Mg Subl (Nitroglycerin) .Marland Kitchen.. 1 Tab Sl By Mouth Q 5 Min As Needed Chest Pain.  Do Not Exceed 3 Within 15 Min 10)  Onetouch Ultra Test   Strp (Glucose Blood) .... Use As Directed 11)  Percocet 10-325 Mg  Tabs (Oxycodone-Acetaminophen) .Marland Kitchen.. 1 Tab By Mouth Three Times A Day As Needed Pain 12)  Hydrochlorothiazide 12.5 Mg  Tabs (Hydrochlorothiazide) .Marland Kitchen.. 1 Tab By Mouth  Daily 13)  Promethazine Hcl 25 Mg Tabs (Promethazine Hcl) .... 1/2-1 Tab By Mouth Q 6 Hrs As Needed For Nausea 14)  Polyethylene Glycol 3350  Powd (Polyethylene Glycol 3350) .Marland KitchenMarland KitchenMarland Kitchen 17 G Dissolved in Water Daily As Needed Constipation 15)  Prodigy No Coding Blood Gluc  Strp (Glucose Blood) .... Test Blood Sugar 4x A Day 16)  Prodigy Twist Top Lancets 28g  Misc (Lancets) .... Test Blood Sugars 4 X A Day 17)  Pantoprazole Sodium 40 Mg Tbec (Pantoprazole Sodium) .Marland Kitchen.. 1 Tab By Mouth Two Times A Day For Reflux 18)  Lisinopril 20 Mg Tabs (Lisinopril) .Marland Kitchen.. 1 Tab By Mouth Daily For Blood Pressure 19)  Nortriptyline Hcl 10 Mg Caps (Nortriptyline Hcl) .... Take 1 Capsule By Mouth At Bedtime 20)  Vitamin D (Ergocalciferol) 50000 Unit Caps (Ergocalciferol) .Marland Kitchen.. 1 Cap By Mouth Once Weekly  X 8 Weeks. 21)  Aciphex 20 Mg Tbec (Rabeprazole Sodium) .... Take 1 Tab Every Morning 22)  Ketoconazole 2 % Sham (Ketoconazole) .... Use As Directed. 23)  Alprazolam 1 Mg Tabs (Alprazolam) .Marland Kitchen.. 1 Tab By Mouth Three Times A Day As Needed Anxiety  Allergies: 1)  ! * Bisphosphonates 2)   Codeine Phosphate (Codeine Phosphate) 3)  Penicillin G Potassium (Penicillin G Potassium)  Review of Systems      See HPI General:  Denies malaise. Eyes:  Denies blurring. CV:  Denies chest pain or discomfort. Resp:  Denies shortness of breath. Neuro:  Denies headaches and visual disturbances.  Physical Exam  General:  Well-developed,well-nourished,in no acute distress; alert,appropriate and cooperative throughout examination Eyes:  EOMI Mouth:  MMM Lungs:  Normal respiratory effort, chest expands symmetrically. Lungs are clear to auscultation, no crackles or wheezes. Heart:  Normal rate and regular rhythm. S1 and S2 normal without gallop, murmur, click, rub or other extra sounds. Extremities:  No clubbing, cyanosis, edema, or deformity noted with normal full range of motion of all joints.   Neurologic:  alert & oriented X3 and gait normal.     Impression & Recommendations:  Problem # 1:  DIABETES MELLITUS, II, COMPLICATIONS (ICD-250.92) Assessment Unchanged Remains brittle.  Was hypoglyemia, improved with orange juice. Admitted to taking insulin and not eating today. Time spent with patient 25 minutes, more than 50% of this time was spent counseling patient on diabetes managment.  She will bring her sister to future appts to help manage her care along with her CBG log and insulin dosing. She is not interested in seeing endo since she has seen in past.  She will discuss with her family.  She is not a candidate for an insulin pump.  Her updated medication list for this problem includes:    Lantus 100 Unit/ml Soln (Insulin glargine) ..... Inject 15 unit subcutaneously every am    Novolog 100 Unit/ml Soln (Insulin aspart) ..... Use sliding scale with meals    Lisinopril 20 Mg Tabs (Lisinopril) .Marland Kitchen... 1 tab by mouth daily for blood pressure  Problem # 2:  HYPERTENSION, BENIGN SYSTEMIC (ICD-401.1) Assessment: Deteriorated Extremely elevated today.  did not take meds.  Given clonidine  and it came down to a reasonable level.  Asymptomatic.  Warning signs given for immediate evaluation.  Will recheck BMET. Take meds as soon as she gets home and f/u with me in 1 week. Her updated medication list for this problem includes:    Hydrochlorothiazide 12.5 Mg Tabs (Hydrochlorothiazide) .Marland Kitchen... 1 tab by mouth daily    Lisinopril 20 Mg Tabs (Lisinopril) .Marland Kitchen... 1 tab by  mouth daily for blood pressure  Orders: Venipuncture (63875) TLB-BMP (Basic Metabolic Panel-BMET) (80048-METABOL)  Complete Medication List: 1)  Gabapentin 300 Mg Tabs (Gabapentin) .... Take 2 tablet by mouth three times a day 2)  Lantus 100 Unit/ml Soln (Insulin glargine) .... Inject 15 unit subcutaneously every am 3)  Novolog 100 Unit/ml Soln (Insulin aspart) .... Use sliding scale with meals 4)  Sertraline Hcl 25 Mg Tabs (Sertraline hcl) .Marland Kitchen.. 1 tab by mouth daily 5)  Simvastatin 20 Mg Tabs (Simvastatin) .... Take 1 tablet by mouth at bedtime 6)  Topamax 100 Mg Tabs (Topiramate) .Marland Kitchen.. 1 tab by mouth two times a day 7)  Onetouch Ultrasoft Lancets Misc (Lancets) .... Use to check blood sugar every morning and 2 hours after meals 8)  Bd Insulin Syringe Ultrafine 29g X 1/2" 0.5 Ml Misc (Insulin syringe-needle u-100) .... Use as directed up to 5 injections daily dispense 2 boxes 9)  Nitroglycerin 0.3 Mg Subl (Nitroglycerin) .Marland Kitchen.. 1 tab sl by mouth q 5 min as needed chest pain.  do not exceed 3 within 15 min 10)  Onetouch Ultra Test Strp (Glucose blood) .... Use as directed 11)  Percocet 10-325 Mg Tabs (Oxycodone-acetaminophen) .Marland Kitchen.. 1 tab by mouth three times a day as needed pain 12)  Hydrochlorothiazide 12.5 Mg Tabs (Hydrochlorothiazide) .Marland Kitchen.. 1 tab by mouth daily 13)  Promethazine Hcl 25 Mg Tabs (Promethazine hcl) .... 1/2-1 tab by mouth q 6 hrs as needed for nausea 14)  Polyethylene Glycol 3350 Powd (Polyethylene glycol 3350) .Marland KitchenMarland KitchenMarland Kitchen 17 g dissolved in water daily as needed constipation 15)  Prodigy No Coding Blood Gluc Strp  (Glucose blood) .... Test blood sugar 4x a day 16)  Prodigy Twist Top Lancets 28g Misc (Lancets) .... Test blood sugars 4 x a day 17)  Pantoprazole Sodium 40 Mg Tbec (Pantoprazole sodium) .Marland Kitchen.. 1 tab by mouth two times a day for reflux 18)  Lisinopril 20 Mg Tabs (Lisinopril) .Marland Kitchen.. 1 tab by mouth daily for blood pressure 19)  Nortriptyline Hcl 10 Mg Caps (Nortriptyline hcl) .... Take 1 capsule by mouth at bedtime 20)  Vitamin D (ergocalciferol) 50000 Unit Caps (Ergocalciferol) .Marland Kitchen.. 1 cap by mouth once weekly  x 8 weeks. 21)  Aciphex 20 Mg Tbec (Rabeprazole sodium) .... Take 1 tab every morning 22)  Ketoconazole 2 % Sham (Ketoconazole) .... Use as directed. 23)  Alprazolam 1 Mg Tabs (Alprazolam) .Marland Kitchen.. 1 tab by mouth three times a day as needed anxiety  Patient Instructions: 1)  Pt illiterate and not here with family. 2)  Given instrucitons for signs and symptoms requiring immediate evaluation such as chest pain, mental status changes, headache blurred vision.  Current Allergies (reviewed today): ! * BISPHOSPHONATES CODEINE PHOSPHATE (CODEINE PHOSPHATE) PENICILLIN G POTASSIUM (PENICILLIN G POTASSIUM)  Laboratory Results   Blood Tests    Date/Time Reported: March 09, 2010 3:09 PM   CBG Random:: 43  mg/dL  Comments: Rechecked after given OJ and a piece of candy.....Marland Kitchen83.  Lugene Fuquay CMA (AAMA)  March 09, 2010 3:10 PM

## 2011-01-30 NOTE — Progress Notes (Signed)
Summary: Critical Lab  Phone Note From Other Clinic   Caller: [facility] Call For: Dr. Dayton Martes Summary of Call: Potassium 6.0.  Not hemmolyzed.   Initial call taken by: Linde Gillis CMA Duncan Dull),  March 23, 2010 4:50 PM  Follow-up for Phone Call        Call in kayexalate.  As written in problem list.  1 dose, then repeat in 12 hours.  will cc: Dr. Dayton Martes  With a BS at 140, non-hemolyzed - this has to be brought down.   Follow-up by: Hannah Beat MD,  March 23, 2010 4:55 PM  Additional Follow-up for Phone Call Additional follow up Details #1::        Rx called into [facility].  Patient notified as instructed, scheduled her to come in tomorrow after her second dose to have her potassium level checked again.   Additional Follow-up by: Linde Gillis CMA Duncan Dull),  March 23, 2010 5:05 PM    Additional Follow-up for Phone Call Additional follow up Details #2::    Agreed.  Thank you. Follow-up by: Ruthe Mannan MD,  March 24, 2010 7:51 AM

## 2011-01-30 NOTE — Progress Notes (Signed)
Summary: tramadol   Phone Note Refill Request Message from:  Fax from Pharmacy on October 02, 2010 10:54 AM  Refills Requested: Medication #1:  TRAMADOL HCL 50 MG  TABS 1 tab by mouth two times a day as needed pain.   Last Refilled: 09/05/2010 Refill request from [facility] pharmacy. [phone]  Initial call taken by: Melody Comas,  October 02, 2010 10:56 AM    Prescriptions: TRAMADOL HCL 50 MG  TABS (TRAMADOL HCL) 1 tab by mouth two times a day as needed pain  #60 x 0   Entered and Authorized by:   Ruthe Mannan MD   Signed by:   Ruthe Mannan MD on 10/02/2010   Method used:   Electronically to        [facility]* (retail)       [address]       [place], [place]  [address]       Ph: [phone]       Fax: [phone]   RxID:   [id number]

## 2011-01-30 NOTE — Progress Notes (Signed)
Summary: requests handicapped placard  Phone Note Call from Patient Call back at Home Phone [phone]   Caller: Patient Call For: Latoya Mannan MD Summary of Call: Pt is asking for a form for a handicapped placard, form is on your desk.  Please call when ready. Initial call taken by: Lowella Petties CMA,  March 22, 2010 1:18 PM  Follow-up for Phone Call        On my desk. Follow-up by: Latoya Mannan MD,  March 22, 2010 1:36 PM  Additional Follow-up for Phone Call Additional follow up Details #1::        Patient Advised. Prescription left at front desk.  Additional Follow-up by: Delilah Shan CMA Duncan Dull),  March 22, 2010 2:50 PM

## 2011-01-30 NOTE — Initial Assessments (Signed)
 Summary: Hospital H&P  Agree with above excellent R1 H&P.  Briefly: The pt is a 49 WF recently DC'ed from [facility] 5 days ago for sepsis, DKA, ARF, PNA.  Comes in today c/o BRBPR for approx 2-3 days.  Has been getting worse but overally feels better than when she left the hospital. C/o occasional lightheadedness/dizziness.  Also with c/o dysuria/frequency, and resolved diarrhea.  See R1 HPI for further details.  Exam: Vitals BP: 164 / 52,  Pulse rate 81, RR: 18, SPO2: 98%RA General: Alert, NAD, thin female HEENT: [place]/AT, PERRL, EOMI, neck supple, no LAD Cardiopulm: RRR, no MRG, CTAB, no rales Abd: Soft, mildly TTP suprapubic and LLQ, no rebound, no palpable masses, no guarding. Rectal:  Anoscopy performed revealing several internal hemorrhoids, one ulcerated, and a few blood clots in rectal vault.  Would need to irrigate and observe to ensure this is bleeding etiology.  No ext hemorrhoids. Ext:  2+ pitting edema in b/l LE, (pt states this has been present since DC but is getting better). Labs: POC CHEM8: 139/4.7/109/23/24/0.8<176  CBC: 5.7>8.8/25.8<255 MCV:97.2 RDW: 13.8 Coags: WNL UA: Straw colored, some blood, OW WNL.  A/P: 33F with BRBPR 1. Rectal Bleeding:  Acute, moderate, does not appear to be an emergent LGIB, anoscopy revealed possible etiology, could also be diverticulosis, fecal impaction higher up than anoscopy revealed although this is unlikely, AVM, tumor. Hb stable since DC 5 days ago.  As pt very tenuous and recently DC'ed with critical illness, would opt to observe over the wknd, also can get GI intervention much more efficiently for her acute problem when admitted, will consult them in AM.  Will also obtain Abd plain films in AM.  2. DM2: Home meds, will half Lantus  to 7units, SSI, clears. Follow CBGs, best mortality benefit at 140-180s when hospitalized.  3. Dysuria:  UA unremarkable, will observe.  4. Fibromyalgia:  Cont Neurontin , Nortryptiline.  5. HTN: Cont Toprol XL  and HCTZ at home doses.  6. GERD:  Ranitidine, protonix .  7. CAD: Cont Toprol XL, NTG as needed, ASA-81.  8. Anxiety/mood disorder:  Cont home Xanax , nortryptiline, sertraline.  9. FEN/GI:  1/2NS @ 125cc/h, clears in anticipation of scope tomorrow.  10.  Dispo:  Will hopefully be a short stay, need to definitively determine etiology of bleeding.

## 2011-01-30 NOTE — Assessment & Plan Note (Signed)
Summary: feeling sick/ alc   Vital Signs:  Patient profile:   60 year old female Height:      63 inches Weight:      118.25 pounds BMI:     21.02 Temp:     97.7 degrees F oral Pulse rate:   84 / minute Pulse rhythm:   regular BP sitting:   132 / 64  (left arm) Cuff size:   regular  Vitals Entered By: Delilah Shan CMA Duncan Dull) (February 21, 2010 11:48 AM) CC: Feeling sick   History of Present Illness: 60 yo with days of GI symptoms. Nausea, diarrhea. Taking phenergan evgery 6 hours, has not vomited. Had body aches yesterday. Drinking a lot of water and pedialyte. No fevers, did have chills. No abdominal pain. Checked CBGs last couple of days, in 100s-200s.  Current Medications (verified): 1)  Gabapentin 300 Mg Tabs (Gabapentin) .... Take 2 Tablet By Mouth Three Times A Day 2)  Lantus 100 Unit/ml Soln (Insulin Glargine) .... Inject 15 Unit Subcutaneously Every Am 3)  Novolog 100 Unit/ml Soln (Insulin Aspart) .... Use Sliding Scale With Meals 4)  Sertraline Hcl 25 Mg Tabs (Sertraline Hcl) .Marland Kitchen.. 1 Tab By Mouth Daily 5)  Simvastatin 20 Mg Tabs (Simvastatin) .... Take 1 Tablet By Mouth At Bedtime 6)  Topamax 100 Mg Tabs (Topiramate) .Marland Kitchen.. 1 Tab By Mouth Two Times A Day 7)  Onetouch Ultrasoft Lancets   Misc (Lancets) .... Use To Check Blood Sugar Every Morning and 2 Hours After Meals 8)  Bd Insulin Syringe Ultrafine 29g X 1/2" 0.5 Ml  Misc (Insulin Syringe-Needle U-100) .... Use As Directed Up To 5 Injections Daily Dispense 2 Boxes 9)  Nitroglycerin 0.3 Mg Subl (Nitroglycerin) .Marland Kitchen.. 1 Tab Sl By Mouth Q 5 Min As Needed Chest Pain.  Do Not Exceed 3 Within 15 Min 10)  Onetouch Ultra Test   Strp (Glucose Blood) .... Use As Directed 11)  Percocet 10-325 Mg  Tabs (Oxycodone-Acetaminophen) .Marland Kitchen.. 1 Tab By Mouth Three Times A Day As Needed Pain 12)  Hydrochlorothiazide 12.5 Mg  Tabs (Hydrochlorothiazide) .Marland Kitchen.. 1 Tab By Mouth Daily 13)  Promethazine Hcl 25 Mg Tabs (Promethazine Hcl) .... 1/2-1 Tab  By Mouth Q 6 Hrs As Needed For Nausea 14)  Polyethylene Glycol 3350  Powd (Polyethylene Glycol 3350) .Marland KitchenMarland KitchenMarland Kitchen 17 G Dissolved in Water Daily As Needed Constipation 15)  Prodigy No Coding Blood Gluc  Strp (Glucose Blood) .... Test Blood Sugar 4x A Day 16)  Prodigy Twist Top Lancets 28g  Misc (Lancets) .... Test Blood Sugars 4 X A Day 17)  Pantoprazole Sodium 40 Mg Tbec (Pantoprazole Sodium) .Marland Kitchen.. 1 Tab By Mouth Two Times A Day For Reflux 18)  Lisinopril 20 Mg Tabs (Lisinopril) .Marland Kitchen.. 1 Tab By Mouth Daily For Blood Pressure 19)  Nortriptyline Hcl 10 Mg Caps (Nortriptyline Hcl) .... Take 1 Capsule By Mouth At Bedtime 20)  Vitamin D (Ergocalciferol) 50000 Unit Caps (Ergocalciferol) .Marland Kitchen.. 1 Cap By Mouth Once Weekly  X 8 Weeks. 21)  Aciphex 20 Mg Tbec (Rabeprazole Sodium) .... Take 1 Tab Every Morning 22)  Ketoconazole 2 % Sham (Ketoconazole) .... Use As Directed. 23)  Alprazolam 1 Mg Tabs (Alprazolam) .Marland Kitchen.. 1 Tab By Mouth Three Times A Day As Needed Anxiety  Allergies: 1)  ! * Bisphosphonates 2)  Codeine Phosphate (Codeine Phosphate) 3)  Penicillin G Potassium (Penicillin G Potassium)  Review of Systems      See HPI General:  Complains of chills. GI:  Complains of diarrhea  and nausea; denies abdominal pain, bloody stools, and vomiting.  Physical Exam  General:  alert.  thin.  pleasant non toxic, afebrile, normotensive. Mouth:  MMM Lungs:  Normal respiratory effort, chest expands symmetrically. Lungs are clear to auscultation, no crackles or wheezes. Heart:  Normal rate and regular rhythm. S1 and S2 normal without gallop, murmur, click, rub or other extra sounds. Abdomen:  Bowel sounds positive,abdomen soft and non-tender without masses, organomegaly or hernias noted. Psych:  flat affect, good eye contact, pleasant.   Impression & Recommendations:  Problem # 1:  GASTROENTERITIS WITHOUT DEHYDRATION (ICD-558.9) Assessment New well hydrated.  Discussed norovirus epidemic with Ms. Devaux and her  sister. Encouraged to continue pushing fluids and washing hands with soap and water. To come back to see [place] or go to ER if unable to stay hydrated.  Complete Medication List: 1)  Gabapentin 300 Mg Tabs (Gabapentin) .... Take 2 tablet by mouth three times a day 2)  Lantus 100 Unit/ml Soln (Insulin glargine) .... Inject 15 unit subcutaneously every am 3)  Novolog 100 Unit/ml Soln (Insulin aspart) .... Use sliding scale with meals 4)  Sertraline Hcl 25 Mg Tabs (Sertraline hcl) .Marland Kitchen.. 1 tab by mouth daily 5)  Simvastatin 20 Mg Tabs (Simvastatin) .... Take 1 tablet by mouth at bedtime 6)  Topamax 100 Mg Tabs (Topiramate) .Marland Kitchen.. 1 tab by mouth two times a day 7)  Onetouch Ultrasoft Lancets Misc (Lancets) .... Use to check blood sugar every morning and 2 hours after meals 8)  Bd Insulin Syringe Ultrafine 29g X 1/2" 0.5 Ml Misc (Insulin syringe-needle u-100) .... Use as directed up to 5 injections daily dispense 2 boxes 9)  Nitroglycerin 0.3 Mg Subl (Nitroglycerin) .Marland Kitchen.. 1 tab sl by mouth q 5 min as needed chest pain.  do not exceed 3 within 15 min 10)  Onetouch Ultra Test Strp (Glucose blood) .... Use as directed 11)  Percocet 10-325 Mg Tabs (Oxycodone-acetaminophen) .Marland Kitchen.. 1 tab by mouth three times a day as needed pain 12)  Hydrochlorothiazide 12.5 Mg Tabs (Hydrochlorothiazide) .Marland Kitchen.. 1 tab by mouth daily 13)  Promethazine Hcl 25 Mg Tabs (Promethazine hcl) .... 1/2-1 tab by mouth q 6 hrs as needed for nausea 14)  Polyethylene Glycol 3350 Powd (Polyethylene glycol 3350) .Marland KitchenMarland KitchenMarland Kitchen 17 g dissolved in water daily as needed constipation 15)  Prodigy No Coding Blood Gluc Strp (Glucose blood) .... Test blood sugar 4x a day 16)  Prodigy Twist Top Lancets 28g Misc (Lancets) .... Test blood sugars 4 x a day 17)  Pantoprazole Sodium 40 Mg Tbec (Pantoprazole sodium) .Marland Kitchen.. 1 tab by mouth two times a day for reflux 18)  Lisinopril 20 Mg Tabs (Lisinopril) .Marland Kitchen.. 1 tab by mouth daily for blood pressure 19)  Nortriptyline Hcl 10 Mg  Caps (Nortriptyline hcl) .... Take 1 capsule by mouth at bedtime 20)  Vitamin D (ergocalciferol) 50000 Unit Caps (Ergocalciferol) .Marland Kitchen.. 1 cap by mouth once weekly  x 8 weeks. 21)  Aciphex 20 Mg Tbec (Rabeprazole sodium) .... Take 1 tab every morning 22)  Ketoconazole 2 % Sham (Ketoconazole) .... Use as directed. 23)  Alprazolam 1 Mg Tabs (Alprazolam) .Marland Kitchen.. 1 tab by mouth three times a day as needed anxiety Prescriptions: ALPRAZOLAM 1 MG TABS (ALPRAZOLAM) 1 tab by mouth three times a day as needed anxiety  #90 x 0   Entered and Authorized by:   Ruthe Mannan MD   Signed by:   Ruthe Mannan MD on 02/21/2010   Method used:   Print then  Give to Patient   RxID:   [id number]   Current Allergies (reviewed today): ! * BISPHOSPHONATES CODEINE PHOSPHATE (CODEINE PHOSPHATE) PENICILLIN G POTASSIUM (PENICILLIN G POTASSIUM)

## 2011-01-30 NOTE — Letter (Signed)
[facility]   Imported By: Lanelle Bal 02/27/2010 11:46:54  _____________________________________________________________________  External Attachment:    Type:   Image     Comment:   External Document

## 2011-01-30 NOTE — Progress Notes (Signed)
Summary: Prodigy glucose monitor  Phone Note Call from Patient Call back at [phone]   Caller: Lesia Sago, pt's daughter in law Call For: Ruthe Mannan MD Summary of Call: Pt's daughter in law called and pt's glucose meter was dropped and is not working now. Insurance will only cover Prodigy glucose monitor. I called [facility] and left order for Prodigy glucose monitor. I called Lesia Sago and got v/m. Lori to call back. Lori at [facility] said do not have in stock but will order and monitor will be there on Monday. Initial call taken by: Lewanda Rife LPN,  January 13, 2010 1:59 PM  Follow-up for Phone Call        Patient advised that glucose meter would not be in until Monday.  Follow-up by: Melody Comas,  January 13, 2010 5:18 PM

## 2011-01-30 NOTE — Progress Notes (Signed)
Summary: Arthritis getting worse  Phone Note Call from Patient Call back at Home Phone [phone]   Caller: Patient Call For: Ruthe Mannan MD Summary of Call: Patient knows you are not in until Monday.  She says her arthritis is getting worse.  She has pain in her hips with standing and walking, her hands and fingers ache, etc.  She says you had mentioned before that you wanted to take care of some other issues before addressing this but it is getting bad.  Patient asks that you phone her on Monday if you can. Initial call taken by: Delilah Shan CMA Duncan Dull),  June 23, 2010 11:32 AM  Follow-up for Phone Call        Called pt back.  Pain is worsening in her hips, especially when she sits.  Hands are getting worse too.  On narcotics, not helping.  Advised pt to make appt to be seen.  Please make it a 30 minute apopintment (Ms. Matson is always 30 min).  Follow-up by: Ruthe Mannan MD,  June 26, 2010 12:45 PM  Additional Follow-up for Phone Call Additional follow up Details #1::        Appt made.              Lowella Petties CMA  June 26, 2010 3:35 PM

## 2011-01-30 NOTE — Assessment & Plan Note (Signed)
Summary: spider bite/alc   Vital Signs:  Patient profile:   60 year old female Height:      63 inches Weight:      118 pounds BMI:     20.98 Temp:     97.9 degrees F oral Pulse rate:   80 / minute Pulse rhythm:   regular BP sitting:   128 / 62  (left arm) Cuff size:   regular  Vitals Entered By: Delilah Shan CMA Duncan Dull) (May 02, 2010 11:41 AM) CC: ? spider bite on toe   History of Present Illness: 60 yo here for ?spite bite of left fourth toe.  Woke up a few days ago with what looked like a bite, did not actually see a spider or other insect bite her. developped redness and itching around it. No pus drainage, no fevers, chills, nausea or vomiting.  Legal aid- Received a letter from legal aid saying that they would help appeal Ambien not being covered through Medicaid.  I did leave a VM for lawyer to call back, Ms. Eversley has still not heard anything.  Current Medications (verified): 1)  Gabapentin 300 Mg Tabs (Gabapentin) .... Take 2 Tablet By Mouth Three Times A Day 2)  Lantus 100 Unit/ml Soln (Insulin Glargine) .... Inject 15 Unit Subcutaneously Every Am 3)  Novolog 100 Unit/ml Soln (Insulin Aspart) .... Use Sliding Scale With Meals 4)  Sertraline Hcl 25 Mg Tabs (Sertraline Hcl) .Marland Kitchen.. 1 Tab By Mouth Daily 5)  Simvastatin 20 Mg Tabs (Simvastatin) .... Take 1 Tablet By Mouth At Bedtime 6)  Topamax 100 Mg Tabs (Topiramate) .Marland Kitchen.. 1 Tab By Mouth Two Times A Day 7)  Onetouch Ultrasoft Lancets   Misc (Lancets) .... Use To Check Blood Sugar Every Morning and 2 Hours After Meals 8)  Bd Insulin Syringe Ultrafine 29g X 1/2" 0.5 Ml  Misc (Insulin Syringe-Needle U-100) .... Use As Directed Up To 5 Injections Daily Dispense 2 Boxes 9)  Nitroglycerin 0.3 Mg Subl (Nitroglycerin) .Marland Kitchen.. 1 Tab Sl By Mouth Q 5 Min As Needed Chest Pain.  Do Not Exceed 3 Within 15 Min 10)  Onetouch Ultra Test   Strp (Glucose Blood) .... Use As Directed 11)  Percocet 10-325 Mg  Tabs (Oxycodone-Acetaminophen) .Marland Kitchen.. 1  Tab By Mouth Three Times A Day As Needed Pain 12)  Hydrochlorothiazide 12.5 Mg  Tabs (Hydrochlorothiazide) .... Take 3  Tabs  By Mouth Daily 13)  Promethazine Hcl 25 Mg Tabs (Promethazine Hcl) .... 1/2-1 Tab By Mouth Q 6 Hrs As Needed For Nausea 14)  Polyethylene Glycol 3350  Powd (Polyethylene Glycol 3350) .Marland KitchenMarland KitchenMarland Kitchen 17 G Dissolved in Water Daily As Needed Constipation 15)  Prodigy No Coding Blood Gluc  Strp (Glucose Blood) .... Test Blood Sugar 4x A Day 16)  Prodigy Twist Top Lancets 28g  Misc (Lancets) .... Test Blood Sugars 4 X A Day 17)  Pantoprazole Sodium 40 Mg Tbec (Pantoprazole Sodium) .Marland Kitchen.. 1 Tab By Mouth Two Times A Day For Reflux 18)  Nortriptyline Hcl 10 Mg Caps (Nortriptyline Hcl) .... Take 1 Capsule By Mouth At Bedtime 19)  Vitamin D (Ergocalciferol) 50000 Unit Caps (Ergocalciferol) .Marland Kitchen.. 1 Cap By Mouth Once Weekly  X 8 Weeks. 20)  Aciphex 20 Mg Tbec (Rabeprazole Sodium) .... Take 1 Tab Every Morning 21)  Ketoconazole 2 % Sham (Ketoconazole) .... Use As Directed. 22)  Alprazolam 1 Mg Tabs (Alprazolam) .Marland Kitchen.. 1 Tab By Mouth Three Times A Day As Needed Anxiety 23)  Aspirin 81 Mg  Tabs (Aspirin) .Marland KitchenMarland KitchenMarland Kitchen  Take 1 Tablet By Mouth Once A Day 24)  Ambien 5 Mg Tabs (Zolpidem Tartrate) .Marland Kitchen.. 1-2 Tab By Mouth At Bedtime As Needed Insomnia 25)  Doxycycline Hyclate 100 Mg Caps (Doxycycline Hyclate) .... Take 1 Tab Twice A Day X 10 Days  Allergies: 1)  ! * Bisphosphonates 2)  ! Hydralazine Hcl 3)  Codeine Phosphate (Codeine Phosphate) 4)  Penicillin G Potassium (Penicillin G Potassium)  Review of Systems      See HPI General:  Denies chills and fever. GI:  Denies nausea and vomiting.  Physical Exam  General:  Well-developed,well-nourished,in no acute distress; alert,appropriate and cooperative throughout examination  Skin:  4th toe on left foot- small abscess, non fluctuant with overlying and surrounding warmth and erythema. Psych:  Cognition and judgment appear intact. Alert and cooperative with  normal attention span and concentration. No apparent delusions, illusions, hallucinations   Impression & Recommendations:  Problem # 1:  CELLULITIS AND ABSCESS OF UNSPECIFIED SITE (ICD-682.9) Assessment New Doxycyline 100 mg two times a day x 10 days. Follow up if worsening or no improvement over next 2-3 days. Her updated medication list for this problem includes:    Doxycycline Hyclate 100 Mg Caps (Doxycycline hyclate) .Marland Kitchen... Take 1 tab twice a day x 10 days  Problem # 2:  INSOMNIA, CHRONIC (ICD-307.42) Assessment: Unchanged Time spent with patient 25 minutes, more than 50% of this time was spent reading material from [facility] and her lawyer with her.  I have attempted to call lawyer, Lorenza Burton, and will call back again today.  Complete Medication List: 1)  Gabapentin 300 Mg Tabs (Gabapentin) .... Take 2 tablet by mouth three times a day 2)  Lantus 100 Unit/ml Soln (Insulin glargine) .... Inject 15 unit subcutaneously every am 3)  Novolog 100 Unit/ml Soln (Insulin aspart) .... Use sliding scale with meals 4)  Sertraline Hcl 25 Mg Tabs (Sertraline hcl) .Marland Kitchen.. 1 tab by mouth daily 5)  Simvastatin 20 Mg Tabs (Simvastatin) .... Take 1 tablet by mouth at bedtime 6)  Topamax 100 Mg Tabs (Topiramate) .Marland Kitchen.. 1 tab by mouth two times a day 7)  Onetouch Ultrasoft Lancets Misc (Lancets) .... Use to check blood sugar every morning and 2 hours after meals 8)  Bd Insulin Syringe Ultrafine 29g X 1/2" 0.5 Ml Misc (Insulin syringe-needle u-100) .... Use as directed up to 5 injections daily dispense 2 boxes 9)  Nitroglycerin 0.3 Mg Subl (Nitroglycerin) .Marland Kitchen.. 1 tab sl by mouth q 5 min as needed chest pain.  do not exceed 3 within 15 min 10)  Onetouch Ultra Test Strp (Glucose blood) .... Use as directed 11)  Percocet 10-325 Mg Tabs (Oxycodone-acetaminophen) .Marland Kitchen.. 1 tab by mouth three times a day as needed pain 12)  Hydrochlorothiazide 12.5 Mg Tabs (Hydrochlorothiazide) .... Take 3  tabs  by mouth daily 13)   Promethazine Hcl 25 Mg Tabs (Promethazine hcl) .... 1/2-1 tab by mouth q 6 hrs as needed for nausea 14)  Polyethylene Glycol 3350 Powd (Polyethylene glycol 3350) .Marland KitchenMarland KitchenMarland Kitchen 17 g dissolved in water daily as needed constipation 15)  Prodigy No Coding Blood Gluc Strp (Glucose blood) .... Test blood sugar 4x a day 16)  Prodigy Twist Top Lancets 28g Misc (Lancets) .... Test blood sugars 4 x a day 17)  Pantoprazole Sodium 40 Mg Tbec (Pantoprazole sodium) .Marland Kitchen.. 1 tab by mouth two times a day for reflux 18)  Nortriptyline Hcl 10 Mg Caps (Nortriptyline hcl) .... Take 1 capsule by mouth at bedtime 19)  Vitamin D (  ergocalciferol) 50000 Unit Caps (Ergocalciferol) .Marland Kitchen.. 1 cap by mouth once weekly  x 8 weeks. 20)  Aciphex 20 Mg Tbec (Rabeprazole sodium) .... Take 1 tab every morning 21)  Ketoconazole 2 % Sham (Ketoconazole) .... Use as directed. 22)  Alprazolam 1 Mg Tabs (Alprazolam) .Marland Kitchen.. 1 tab by mouth three times a day as needed anxiety 23)  Aspirin 81 Mg Tabs (Aspirin) .... Take 1 tablet by mouth once a day 24)  Ambien 5 Mg Tabs (Zolpidem tartrate) .Marland Kitchen.. 1-2 tab by mouth at bedtime as needed insomnia 25)  Doxycycline Hyclate 100 Mg Caps (Doxycycline hyclate) .... Take 1 tab twice a day x 10 days Prescriptions: DOXYCYCLINE HYCLATE 100 MG CAPS (DOXYCYCLINE HYCLATE) Take 1 tab twice a day x 10 days  #20 x 0   Entered and Authorized by:   Ruthe Mannan MD   Signed by:   Ruthe Mannan MD on 05/02/2010   Method used:   Electronically to        [facility]* (retail)       [address]       [place], [place]  [address]       Ph: [phone]       Fax: [phone]   RxID:   [id number]   Current Allergies (reviewed today): ! * BISPHOSPHONATES ! HYDRALAZINE HCL CODEINE PHOSPHATE (CODEINE PHOSPHATE) PENICILLIN G POTASSIUM (PENICILLIN G POTASSIUM)

## 2011-01-30 NOTE — Progress Notes (Signed)
Summary: Hair loss worse  Phone Note Call from Patient Call back at [phone]   Caller: Patient Call For: Ruthe Mannan MD Summary of Call: Pt has seen Dr. Dayton Martes for problem with hair coming out. pt states hair getting so thin causeing pt to feel depressed. Request shampoo called Ketoconazole 2% shampoo.  Pt uses [facility] [phone]. Please advise.    Initial call taken by: Lewanda Rife LPN,  January 23, 2010 11:02 AM  Follow-up for Phone Call        Is she having itching?  I felt it was due to Vit D deficiency. Follow-up by: Ruthe Mannan MD,  January 23, 2010 11:10 AM  Additional Follow-up for Phone Call Additional follow up Details #1::        Patient is not having any problem with itching.  She says that her hair is coming out so bad and her sister's MD had put her on this shampoo and it had helped hers so she wants it too.   Patient also asks if she could get an ANA added to her labs on February 24th?Marland Kitchen  She has tested high in the past and her sister and neice both have lupus.   Additional Follow-up by: Delilah Shan CMA Duncan Dull),  January 23, 2010 11:21 AM    Additional Follow-up for Phone Call Additional follow up Details #2::    I don't think she needs the ketoconazole if she is not itching.  She can try it if she really wants to but I do not think it is necessary.  It should not harm her.  We can add ANA to her labs. Follow-up by: Ruthe Mannan MD,  January 23, 2010 11:25 AM  New/Updated Medications: KETOCONAZOLE 2 % SHAM (KETOCONAZOLE) Use as directed. Prescriptions: KETOCONAZOLE 2 % SHAM (KETOCONAZOLE) Use as directed.  #1 x 0   Entered and Authorized by:   Ruthe Mannan MD   Signed by:   Ruthe Mannan MD on 01/23/2010   Method used:   Electronically to        [facility]* (retail)       [address]       [place], [place]  [address]       Ph: [phone]       Fax: [phone]   RxID:   [id number]   Appended Document: Hair loss worse Okay, I'll add the ANA but  I need a code.  Appended Document: Hair loss worse Thank you. [phone]

## 2011-01-30 NOTE — Progress Notes (Signed)
Summary: refill requests for ketoconozole and xanax  Phone Note Refill Request Message from:  Fax from Pharmacy  Refills Requested: Medication #1:  KETOCONAZOLE 2 % SHAM Use as directed.   Last Refilled: 01/23/2010  Medication #2:  ALPRAZOLAM 1 MG TABS 1 tab by mouth three times a day as needed anxiety   Last Refilled: 03/16/2010 Faxed requests from [place] pharmacy.  [phone]  Initial call taken by: Lowella Petties CMA,  April 12, 2010 12:18 PM  Follow-up for Phone Call        Called in Rx for Alprazolam to [facility] Follow-up by: Linde Gillis CMA Duncan Dull),  April 12, 2010 2:15 PM    Prescriptions: ALPRAZOLAM 1 MG TABS (ALPRAZOLAM) 1 tab by mouth three times a day as needed anxiety  #90 x 0   Entered and Authorized by:   Ruthe Mannan MD   Signed by:   Ruthe Mannan MD on 04/12/2010   Method used:   Handwritten   RxID:   [id number] KETOCONAZOLE 2 % SHAM (KETOCONAZOLE) Use as directed.  #1 x 0   Entered and Authorized by:   Ruthe Mannan MD   Signed by:   Ruthe Mannan MD on 04/12/2010   Method used:   Electronically to        [facility]* (retail)       [address]       [place], [place]  [address]       Ph: [phone]       Fax: [phone]   RxID:   [id number]

## 2011-01-30 NOTE — Progress Notes (Signed)
Summary: Rx Lantus solostar & Novolog Flexpen  Phone Note Refill Request Message from:  [facility] pharmacy on November 02, 2010 10:42 AM  Refills Requested: Medication #1:  LANTUS 100 UNIT/ML SOLN Inject 15 unit subcutaneously every am   Last Refilled: 10/03/2010   Notes: Lantus Solostar instead  Medication #2:  NOVOLOG 100 UNIT/ML SOLN use sliding scale with meals- (4- 14 units four times daily depending on CBG)   Last Refilled: 10/03/2010   Notes: Novolog flexpen instead Patient would like Lantus Solostar instead of plain Lantus, and Novolog flexpen instead of the Novolog.  Please advise.   Method Requested: Electronic Initial call taken by: Linde Gillis CMA Duncan Dull),  November 02, 2010 10:45 AM    New/Updated Medications: LANTUS SOLOSTAR 100 UNIT/ML SOLN (INSULIN GLARGINE) 15 units at bedtime. NOVOLOG FLEXPEN 100 UNIT/ML SOLN (INSULIN ASPART) Use as directed. Prescriptions: NOVOLOG FLEXPEN 100 UNIT/ML SOLN (INSULIN ASPART) Use as directed.  #1 x 3   Entered and Authorized by:   Ruthe Mannan MD   Signed by:   Ruthe Mannan MD on 11/02/2010   Method used:   Electronically to        [facility]* (retail)       [address]       [place], [place]  [address]       Ph: [phone]       Fax: [phone]   RxID:   [id number] LANTUS SOLOSTAR 100 UNIT/ML SOLN (INSULIN GLARGINE) 15 units at bedtime.  #1 x 3   Entered and Authorized by:   Ruthe Mannan MD   Signed by:   Ruthe Mannan MD on 11/02/2010   Method used:   Electronically to        [facility]* (retail)       [address]       [place], [place]  [address]       Ph: [phone]       Fax: [phone]   RxID:   [id number]   Appended Document: Rx Lantus solostar & Novolog Flexpen Patient advised as instructed via telephone.

## 2011-01-30 NOTE — Progress Notes (Signed)
Summary: PERCOCET 10-325 MG   Phone Note Refill Request Call back at Home Phone [phone] Message from:  Patient on August 01, 2010 11:27 AM  Refills Requested: Medication #1:  PERCOCET 10-325 MG  TABS 1 tab by mouth three times a day as needed pain Patient want to pick this up on Friday.   Method Requested: Pick up at Office Initial call taken by: Melody Comas,  August 01, 2010 11:28 AM    Prescriptions: PERCOCET 10-325 MG  TABS (OXYCODONE-ACETAMINOPHEN) 1 tab by mouth three times a day as needed pain  #90 x 0   Entered and Authorized by:   Ruthe Mannan MD   Signed by:   Ruthe Mannan MD on 08/01/2010   Method used:   Print then Give to Patient   RxID:   [id number]   Appended Document: PERCOCET 10-325 MG  Patient notified, Rx left at front desk for pick up.  Patient will pick it up on Friday.

## 2011-01-30 NOTE — Assessment & Plan Note (Signed)
Summary: [name] Scarlet   Vital Signs:  Patient profile:   60 year old female Height:      63 inches Weight:      116 pounds BMI:     20.62 Temp:     97.9 degrees F oral Pulse rate:   68 / minute Pulse rhythm:   regular BP sitting:   112 / 60  (left arm) Cuff size:   regular  Vitals Entered By: Delilah Shan CMA Duncan Dull) (March 13, 2010 3:20 PM) CC: follow-up visit per Dr. Dayton Martes   History of Present Illness:  Here for follow up  elevated blood pressure and hyperkalemia.    Really brittle diabetic. Has been in the hospital in [place] multiple times. CBGs run from 20s-600s.  Typcially asymptomatic when she is hypoglycemia.  She did bring in a CBG log today. 78-175.  Right now is taking Lantus 15 units a day with novolog sliding scale.    HTN- BP extremely elevated last week first check 222/82, Given Clonidine 0/2, came down to 162/78.  did not take BP meds today.  Today, BP 112/60.  She thinks that she was very anxious at last office visit and had not been taking her medication and was hypoglycemic.  Hyperkalemia- K was 6.1 last week.  Has been a chronic issue for her due to her hyperglycemia and electrolyte imbalances.  Current Medications (verified): 1)  Gabapentin 300 Mg Tabs (Gabapentin) .... Take 2 Tablet By Mouth Three Times A Day 2)  Lantus 100 Unit/ml Soln (Insulin Glargine) .... Inject 15 Unit Subcutaneously Every Am 3)  Novolog 100 Unit/ml Soln (Insulin Aspart) .... Use Sliding Scale With Meals 4)  Sertraline Hcl 25 Mg Tabs (Sertraline Hcl) .Marland Kitchen.. 1 Tab By Mouth Daily 5)  Simvastatin 20 Mg Tabs (Simvastatin) .... Take 1 Tablet By Mouth At Bedtime 6)  Topamax 100 Mg Tabs (Topiramate) .Marland Kitchen.. 1 Tab By Mouth Two Times A Day 7)  Onetouch Ultrasoft Lancets   Misc (Lancets) .... Use To Check Blood Sugar Every Morning and 2 Hours After Meals 8)  Bd Insulin Syringe Ultrafine 29g X 1/2" 0.5 Ml  Misc (Insulin Syringe-Needle U-100) .... Use As Directed Up To 5 Injections  Daily Dispense 2 Boxes 9)  Nitroglycerin 0.3 Mg Subl (Nitroglycerin) .Marland Kitchen.. 1 Tab Sl By Mouth Q 5 Min As Needed Chest Pain.  Do Not Exceed 3 Within 15 Min 10)  Onetouch Ultra Test   Strp (Glucose Blood) .... Use As Directed 11)  Percocet 10-325 Mg  Tabs (Oxycodone-Acetaminophen) .Marland Kitchen.. 1 Tab By Mouth Three Times A Day As Needed Pain 12)  Hydrochlorothiazide 12.5 Mg  Tabs (Hydrochlorothiazide) .... Take 3  Tabs  By Mouth Daily 13)  Promethazine Hcl 25 Mg Tabs (Promethazine Hcl) .... 1/2-1 Tab By Mouth Q 6 Hrs As Needed For Nausea 14)  Polyethylene Glycol 3350  Powd (Polyethylene Glycol 3350) .Marland KitchenMarland KitchenMarland Kitchen 17 G Dissolved in Water Daily As Needed Constipation 15)  Prodigy No Coding Blood Gluc  Strp (Glucose Blood) .... Test Blood Sugar 4x A Day 16)  Prodigy Twist Top Lancets 28g  Misc (Lancets) .... Test Blood Sugars 4 X A Day 17)  Pantoprazole Sodium 40 Mg Tbec (Pantoprazole Sodium) .Marland Kitchen.. 1 Tab By Mouth Two Times A Day For Reflux 18)  Lisinopril 20 Mg Tabs (Lisinopril) .Marland Kitchen.. 1 Tab By Mouth Daily For Blood Pressure 19)  Nortriptyline Hcl 10 Mg Caps (Nortriptyline Hcl) .... Take 1 Capsule By Mouth At Bedtime 20)  Vitamin D (Ergocalciferol) 50000  Unit Caps (Ergocalciferol) .Marland Kitchen.. 1 Cap By Mouth Once Weekly  X 8 Weeks. 21)  Aciphex 20 Mg Tbec (Rabeprazole Sodium) .... Take 1 Tab Every Morning 22)  Ketoconazole 2 % Sham (Ketoconazole) .... Use As Directed. 23)  Alprazolam 1 Mg Tabs (Alprazolam) .Marland Kitchen.. 1 Tab By Mouth Three Times A Day As Needed Anxiety 24)  Aspirin 81 Mg  Tabs (Aspirin) .... Take 1 Tablet By Mouth Once A Day  Allergies: 1)  ! * Bisphosphonates 2)  Codeine Phosphate (Codeine Phosphate) 3)  Penicillin G Potassium (Penicillin G Potassium)  Review of Systems      See HPI CV:  Denies chest pain or discomfort, shortness of breath with exertion, swelling of feet, and swelling of hands. GI:  Denies abdominal pain, nausea, and vomiting. Endo:  Denies polyuria.  Physical Exam  General:   Well-developed,well-nourished,in no acute distress; alert,appropriate and cooperative throughout examination Mouth:  MMM Lungs:  Normal respiratory effort, chest expands symmetrically. Lungs are clear to auscultation, no crackles or wheezes. Heart:  Normal rate and regular rhythm. S1 and S2 normal without gallop, murmur, click, rub or other extra sounds. Abdomen:  Bowel sounds positive,abdomen soft and non-tender without masses, organomegaly or hernias noted. Extremities:  No clubbing, cyanosis, edema, or deformity noted with normal full range of motion of all joints.   Psych:  Cognition and judgment appear intact. Alert and cooperative with normal attention span and concentration. No apparent delusions, illusions, hallucinations   Impression & Recommendations:  Problem # 1:  HYPERTENSION, BENIGN SYSTEMIC (ICD-401.1) Assessment Improved Much improved!  Continue current meds. Her updated medication list for this problem includes:    Hydrochlorothiazide 12.5 Mg Tabs (Hydrochlorothiazide) .Marland Kitchen... Take 3  tabs  by mouth daily    Lisinopril 20 Mg Tabs (Lisinopril) .Marland Kitchen... 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  Will continue to encourage re establishing with endo. Her updated medication list for this problem includes:    Lantus 100 Unit/ml Soln (Insulin glargine) ..... Inject 15 unit subcutaneously every am    Novolog 100 Unit/ml Soln (Insulin aspart) ..... Use sliding scale with meals    Lisinopril 20 Mg Tabs (Lisinopril) .Marland Kitchen... 1 tab by mouth daily for blood pressure    Aspirin 81 Mg Tabs (Aspirin) .Marland Kitchen... Take 1 tablet by mouth once a day  Problem # 3:  HYPERKALEMIA (ICD-276.7) Assessment: Unchanged Recheck BMET today. Orders: Venipuncture (13086) TLB-BMP (Basic Metabolic Panel-BMET) (80048-METABOL)  Complete Medication List: 1)  Gabapentin 300 Mg Tabs (Gabapentin) .... Take 2 tablet by  mouth three times a day 2)  Lantus 100 Unit/ml Soln (Insulin glargine) .... Inject 15 unit subcutaneously every am 3)  Novolog 100 Unit/ml Soln (Insulin aspart) .... Use sliding scale with meals 4)  Sertraline Hcl 25 Mg Tabs (Sertraline hcl) .Marland Kitchen.. 1 tab by mouth daily 5)  Simvastatin 20 Mg Tabs (Simvastatin) .... Take 1 tablet by mouth at bedtime 6)  Topamax 100 Mg Tabs (Topiramate) .Marland Kitchen.. 1 tab by mouth two times a day 7)  Onetouch Ultrasoft Lancets Misc (Lancets) .... Use to check blood sugar every morning and 2 hours after meals 8)  Bd Insulin Syringe Ultrafine 29g X 1/2" 0.5 Ml Misc (Insulin syringe-needle u-100) .... Use as directed up to 5 injections daily dispense 2 boxes 9)  Nitroglycerin 0.3 Mg Subl (Nitroglycerin) .Marland Kitchen.. 1 tab sl by mouth q 5 min as needed chest pain.  do not exceed 3 within 15  min 10)  Onetouch Ultra Test Strp (Glucose blood) .... Use as directed 11)  Percocet 10-325 Mg Tabs (Oxycodone-acetaminophen) .Marland Kitchen.. 1 tab by mouth three times a day as needed pain 12)  Hydrochlorothiazide 12.5 Mg Tabs (Hydrochlorothiazide) .... Take 3  tabs  by mouth daily 13)  Promethazine Hcl 25 Mg Tabs (Promethazine hcl) .... 1/2-1 tab by mouth q 6 hrs as needed for nausea 14)  Polyethylene Glycol 3350 Powd (Polyethylene glycol 3350) .Marland KitchenMarland KitchenMarland Kitchen 17 g dissolved in water daily as needed constipation 15)  Prodigy No Coding Blood Gluc Strp (Glucose blood) .... Test blood sugar 4x a day 16)  Prodigy Twist Top Lancets 28g Misc (Lancets) .... Test blood sugars 4 x a day 17)  Pantoprazole Sodium 40 Mg Tbec (Pantoprazole sodium) .Marland Kitchen.. 1 tab by mouth two times a day for reflux 18)  Lisinopril 20 Mg Tabs (Lisinopril) .Marland Kitchen.. 1 tab by mouth daily for blood pressure 19)  Nortriptyline Hcl 10 Mg Caps (Nortriptyline hcl) .... Take 1 capsule by mouth at bedtime 20)  Vitamin D (ergocalciferol) 50000 Unit Caps (Ergocalciferol) .Marland Kitchen.. 1 cap by mouth once weekly  x 8 weeks. 21)  Aciphex 20 Mg Tbec (Rabeprazole sodium) .... Take 1  tab every morning 22)  Ketoconazole 2 % Sham (Ketoconazole) .... Use as directed. 23)  Alprazolam 1 Mg Tabs (Alprazolam) .Marland Kitchen.. 1 tab by mouth three times a day as needed anxiety 24)  Aspirin 81 Mg Tabs (Aspirin) .... Take 1 tablet by mouth once a day  Current Allergies (reviewed today): ! * BISPHOSPHONATES CODEINE PHOSPHATE (CODEINE PHOSPHATE) PENICILLIN G POTASSIUM (PENICILLIN G POTASSIUM)

## 2011-01-30 NOTE — Assessment & Plan Note (Signed)
Summary: Check BP and K+ per TA Salley Scarlet   Vital Signs:  Patient profile:   60 year old female Height:      63 inches Weight:      121 pounds BMI:     21.51 Temp:     97.8 degrees F oral Pulse rate:   72 / minute Pulse rhythm:   regular BP sitting:   122 / 60  (left arm) Cuff size:   regular  Vitals Entered By: Delilah Shan CMA Duncan Dull) (April 06, 2010 3:11 PM) CC: Check BP and K+   History of Present Illness: Here for follow up  elevated blood pressure and hyperkalemia.   HTN- very labile BP.  Due to hyperkalemia, d/c'd Lisinopril and started Hydralazine 10 mg po qid on 3/31.  Not having any known side effects from the Hydralazine.  No CP, SOB, blurred vision, LE edema.  Hyperkalemia-   Has been a chronic issue for her due to her hyperglycemia and electrolyte imbalances.  Has been above 6 last few times we checked with relatively normal CBGs.  Therefore stopped lisinopril.  Insomnia- has always had issues falling asleep. Now it is really making her anxious because she wants to make sure she is awake in time to take her morning hydralazine.  Was previously on [place] and would like to restart it for a short time.  Current Medications (verified): 1)  Gabapentin 300 Mg Tabs (Gabapentin) .... Take 2 Tablet By Mouth Three Times A Day 2)  Lantus 100 Unit/ml Soln (Insulin Glargine) .... Inject 15 Unit Subcutaneously Every Am 3)  Novolog 100 Unit/ml Soln (Insulin Aspart) .... Use Sliding Scale With Meals 4)  Sertraline Hcl 25 Mg Tabs (Sertraline Hcl) .Marland Kitchen.. 1 Tab By Mouth Daily 5)  Simvastatin 20 Mg Tabs (Simvastatin) .... Take 1 Tablet By Mouth At Bedtime 6)  Topamax 100 Mg Tabs (Topiramate) .Marland Kitchen.. 1 Tab By Mouth Two Times A Day 7)  Onetouch Ultrasoft Lancets   Misc (Lancets) .... Use To Check Blood Sugar Every Morning and 2 Hours After Meals 8)  Bd Insulin Syringe Ultrafine 29g X 1/2" 0.5 Ml  Misc (Insulin Syringe-Needle U-100) .... Use As Directed Up To 5 Injections Daily Dispense 2 Boxes 9)   Nitroglycerin 0.3 Mg Subl (Nitroglycerin) .Marland Kitchen.. 1 Tab Sl By Mouth Q 5 Min As Needed Chest Pain.  Do Not Exceed 3 Within 15 Min 10)  Onetouch Ultra Test   Strp (Glucose Blood) .... Use As Directed 11)  Percocet 10-325 Mg  Tabs (Oxycodone-Acetaminophen) .Marland Kitchen.. 1 Tab By Mouth Three Times A Day As Needed Pain 12)  Hydrochlorothiazide 12.5 Mg  Tabs (Hydrochlorothiazide) .... Take 3  Tabs  By Mouth Daily 13)  Promethazine Hcl 25 Mg Tabs (Promethazine Hcl) .... 1/2-1 Tab By Mouth Q 6 Hrs As Needed For Nausea 14)  Polyethylene Glycol 3350  Powd (Polyethylene Glycol 3350) .Marland KitchenMarland KitchenMarland Kitchen 17 G Dissolved in Water Daily As Needed Constipation 15)  Prodigy No Coding Blood Gluc  Strp (Glucose Blood) .... Test Blood Sugar 4x A Day 16)  Prodigy Twist Top Lancets 28g  Misc (Lancets) .... Test Blood Sugars 4 X A Day 17)  Pantoprazole Sodium 40 Mg Tbec (Pantoprazole Sodium) .Marland Kitchen.. 1 Tab By Mouth Two Times A Day For Reflux 18)  Nortriptyline Hcl 10 Mg Caps (Nortriptyline Hcl) .... Take 1 Capsule By Mouth At Bedtime 19)  Vitamin D (Ergocalciferol) 50000 Unit Caps (Ergocalciferol) .Marland Kitchen.. 1 Cap By Mouth Once Weekly  X 8 Weeks. 20)  Aciphex 20 Mg  Tbec (Rabeprazole Sodium) .... Take 1 Tab Every Morning 21)  Ketoconazole 2 % Sham (Ketoconazole) .... Use As Directed. 22)  Alprazolam 1 Mg Tabs (Alprazolam) .Marland Kitchen.. 1 Tab By Mouth Three Times A Day As Needed Anxiety 23)  Aspirin 81 Mg  Tabs (Aspirin) .... Take 1 Tablet By Mouth Once A Day 24)  Kayexalate  Powd (Sodium Polystyrene Sulfonate) .Marland Kitchen.. 15gm By Mouth X 1 Then Repeat Tommorow. Will Cause Bowel Movement. 25)  Hydralazine Hcl 10 Mg Tabs (Hydralazine Hcl) .Marland Kitchen.. 1 Tab By Mouth Qid 26)  Ambien 5 Mg Tabs (Zolpidem Tartrate) .Marland Kitchen.. 1-2 Tab By Mouth At Bedtime As Needed Insomnia  Allergies: 1)  ! * Bisphosphonates 2)  Codeine Phosphate (Codeine Phosphate) 3)  Penicillin G Potassium (Penicillin G Potassium)  Review of Systems      See HPI Eyes:  Denies blurring. CV:  Denies chest pain or  discomfort. Resp:  Denies shortness of breath.  Physical Exam  General:  Well-developed,well-nourished,in no acute distress; alert,appropriate and cooperative throughout examination Normotensive! Mouth:  MMM Lungs:  Normal respiratory effort, chest expands symmetrically. Lungs are clear to auscultation, no crackles or wheezes. Heart:  Normal rate and regular rhythm. S1 and S2 normal without gallop, murmur, click, rub or other extra sounds. Extremities:  No clubbing, cyanosis, edema, or deformity noted with normal full range of motion of all joints.   Psych:  Cognition and judgment appear intact. Alert and cooperative with normal attention span and concentration. No apparent delusions, illusions, hallucinations   Impression & Recommendations:  Problem # 1:  HYPERTENSION, BENIGN SYSTEMIC (ICD-401.1) Assessment Improved Doing very well with addition of Hydralazine.  Continue HCTZ with it as well. Her updated medication list for this problem includes:    Hydrochlorothiazide 12.5 Mg Tabs (Hydrochlorothiazide) .Marland Kitchen... Take 3  tabs  by mouth daily    Hydralazine Hcl 10 Mg Tabs (Hydralazine hcl) .Marland Kitchen... 1 tab by mouth qid  Problem # 2:  HYPERKALEMIA (ICD-276.7) Assessment: Unchanged Has been a chronic issue, hopefully improved with discontinuation of ACEI.  Recheck BMET today. Orders: Venipuncture (16109) TLB-BMP (Basic Metabolic Panel-BMET) (80048-METABOL)  Problem # 3:  INSOMNIA, CHRONIC (ICD-307.42) Assessment: Deteriorated Restart ambien for short time.    Complete Medication List: 1)  Gabapentin 300 Mg Tabs (Gabapentin) .... Take 2 tablet by mouth three times a day 2)  Lantus 100 Unit/ml Soln (Insulin glargine) .... Inject 15 unit subcutaneously every am 3)  Novolog 100 Unit/ml Soln (Insulin aspart) .... Use sliding scale with meals 4)  Sertraline Hcl 25 Mg Tabs (Sertraline hcl) .Marland Kitchen.. 1 tab by mouth daily 5)  Simvastatin 20 Mg Tabs (Simvastatin) .... Take 1 tablet by mouth at  bedtime 6)  Topamax 100 Mg Tabs (Topiramate) .Marland Kitchen.. 1 tab by mouth two times a day 7)  Onetouch Ultrasoft Lancets Misc (Lancets) .... Use to check blood sugar every morning and 2 hours after meals 8)  Bd Insulin Syringe Ultrafine 29g X 1/2" 0.5 Ml Misc (Insulin syringe-needle u-100) .... Use as directed up to 5 injections daily dispense 2 boxes 9)  Nitroglycerin 0.3 Mg Subl (Nitroglycerin) .Marland Kitchen.. 1 tab sl by mouth q 5 min as needed chest pain.  do not exceed 3 within 15 min 10)  Onetouch Ultra Test Strp (Glucose blood) .... Use as directed 11)  Percocet 10-325 Mg Tabs (Oxycodone-acetaminophen) .Marland Kitchen.. 1 tab by mouth three times a day as needed pain 12)  Hydrochlorothiazide 12.5 Mg Tabs (Hydrochlorothiazide) .... Take 3  tabs  by mouth daily 13)  Promethazine Hcl  25 Mg Tabs (Promethazine hcl) .... 1/2-1 tab by mouth q 6 hrs as needed for nausea 14)  Polyethylene Glycol 3350 Powd (Polyethylene glycol 3350) .Marland KitchenMarland KitchenMarland Kitchen 17 g dissolved in water daily as needed constipation 15)  Prodigy No Coding Blood Gluc Strp (Glucose blood) .... Test blood sugar 4x a day 16)  Prodigy Twist Top Lancets 28g Misc (Lancets) .... Test blood sugars 4 x a day 17)  Pantoprazole Sodium 40 Mg Tbec (Pantoprazole sodium) .Marland Kitchen.. 1 tab by mouth two times a day for reflux 18)  Nortriptyline Hcl 10 Mg Caps (Nortriptyline hcl) .... Take 1 capsule by mouth at bedtime 19)  Vitamin D (ergocalciferol) 50000 Unit Caps (Ergocalciferol) .Marland Kitchen.. 1 cap by mouth once weekly  x 8 weeks. 20)  Aciphex 20 Mg Tbec (Rabeprazole sodium) .... Take 1 tab every morning 21)  Ketoconazole 2 % Sham (Ketoconazole) .... Use as directed. 22)  Alprazolam 1 Mg Tabs (Alprazolam) .Marland Kitchen.. 1 tab by mouth three times a day as needed anxiety 23)  Aspirin 81 Mg Tabs (Aspirin) .... Take 1 tablet by mouth once a day 24)  Kayexalate Powd (Sodium polystyrene sulfonate) .Marland Kitchen.. 15gm by mouth x 1 then repeat tommorow. will cause bowel movement. 25)  Hydralazine Hcl 10 Mg Tabs (Hydralazine hcl)  .Marland Kitchen.. 1 tab by mouth qid 26)  Ambien 5 Mg Tabs (Zolpidem tartrate) .Marland Kitchen.. 1-2 tab by mouth at bedtime as needed insomnia Prescriptions: AMBIEN 5 MG TABS (ZOLPIDEM TARTRATE) 1-2 tab by mouth at bedtime as needed insomnia  #60 x 0   Entered and Authorized by:   Ruthe Mannan MD   Signed by:   Ruthe Mannan MD on 04/06/2010   Method used:   Print then Give to Patient   RxID:   [id number]   Current Allergies (reviewed today): ! * BISPHOSPHONATES CODEINE PHOSPHATE (CODEINE PHOSPHATE) PENICILLIN G POTASSIUM (PENICILLIN G POTASSIUM)

## 2011-01-30 NOTE — Progress Notes (Signed)
Summary: zolpidem  Phone Note Refill Request Message from:  Patient on September 07, 2010 4:21 PM  Refills Requested: Medication #1:  ZOLPIDEM TARTRATE 10 MG  TABS 1/2 or 1 by mouth at hs prn   Last Refilled: 07/31/2010 Refill request from [facility]. [phone]  Initial call taken by: Melody Comas,  September 07, 2010 4:22 PM  Follow-up for Phone Call        Rx called to pharmacy Follow-up by: Linde Gillis CMA Duncan Dull),  September 08, 2010 10:52 AM    Prescriptions: ZOLPIDEM TARTRATE 10 MG  TABS (ZOLPIDEM TARTRATE) 1/2 or 1 by mouth at hs prn  #30 x 0   Entered and Authorized by:   Ruthe Mannan MD   Signed by:   Ruthe Mannan MD on 09/08/2010   Method used:   Telephoned to ...       [facility]* (retail)       [address]       [place], [place]  [address]       Ph: [phone]       Fax: [phone]   RxID:   [id number]

## 2011-01-30 NOTE — Letter (Signed)
Summary: Out of Work  [facility] [employer] [facility] at [facility]  [address] [place], [place] [address]   Phone: [phone]  Fax: [phone]    August 03, 2010   Employee:  JENIPHER HAVEL    To Whom It May Concern:   For Medical reasons, please allow Ms. Verga to have a parking space close to her apartment.  She has severe diabetes and arthritis that makes it dangerous for her to walk long distance. If you need additional information, please feel free to contact our office.         Sincerely,    Ruthe Mannan MD

## 2011-01-30 NOTE — Progress Notes (Signed)
Summary: refill request for percocet  Phone Note Refill Request Call back at Home Phone [phone] Message from:  Patient  Refills Requested: Medication #1:  PERCOCET 10-325 MG  TABS 1 tab by mouth three times a day as needed pain Please call pt when ready, she wants to pick this up on wednesday.  Initial call taken by: Lowella Petties CMA,  April 04, 2010 3:21 PM    Prescriptions: PERCOCET 10-325 MG  TABS (OXYCODONE-ACETAMINOPHEN) 1 tab by mouth three times a day as needed pain  #90 x 0   Entered and Authorized by:   Ruthe Mannan MD   Signed by:   Ruthe Mannan MD on 04/05/2010   Method used:   Print then Give to Patient   RxID:   [id number]   Appended Document: refill request for percocet Patient Advised. Prescription left at front desk.

## 2011-01-30 NOTE — Progress Notes (Signed)
Summary: Hair Loss  Phone Note Call from Patient Call back at [phone]   Caller: Daughter Call For: Ruthe Mannan MD Summary of Call: pt's daughter states that when she was in the hospital she was put back on Protonix and states this is what is causing her hair loss. The family has done some research and protonix causes hair loss, family states pt's hair is thin. She would like to be put back on aciphex, but medicaid will not cover that, would like for Dr. Dayton Martes to call medicaid and get his approved. Initial call taken by: Mervin Hack CMA Duncan Dull),  January 05, 2010 9:21 AM  Follow-up for Phone Call        I cannot force medicaid to approve drugs.  We can try omeprazole if she would like. Follow-up by: Ruthe Mannan MD,  January 05, 2010 9:31 AM  Additional Follow-up for Phone Call Additional follow up Details #1::        Daughter-in-law says she has been on several different meds from [facility] and is asking if we could review those records to see if she has been on Omeprazole in the past.  Also, she says she was told that if she has tried these numerous other meds without success, that they would then cover it.  Lugene Fuquay CMA Duncan Dull)  January 05, 2010 9:40 AM     Additional Follow-up for Phone Call Additional follow up Details #2::    It looks like she tried Omeprazole in the past so Dr. Lafonda Mosses at [facility] filled out a prior authorization for aciphex in October.  That should still be good.  If not, her pharmacy will need to send a new one.  Found samples of aciphex I left on your desk for her until she gets it worked out. Follow-up by: Ruthe Mannan MD,  January 05, 2010 9:44 AM  Additional Follow-up for Phone Call Additional follow up Details #3:: Details for Additional Follow-up Action Taken: Daughter says that the pt has not been able to get aciphex since april or so.  I advised her that her pharmacy will need to send [place] a prior auth form so that we can get proper form from medicaid.  Advised her  that samples would be left up front. Additional Follow-up by: Lowella Petties CMA,  January 05, 2010 2:25 PM

## 2011-01-30 NOTE — Progress Notes (Signed)
Summary: [facility]   Phone Note Refill Request Message from:  Fax from Pharmacy on December 04, 2010 11:35 AM  Refills Requested: Medication #1:  ZOLPIDEM TARTRATE 10 MG  TABS 1/2 or 1 by mouth at hs prn   Last Refilled: 10/03/2010 Refill request from [facility] pharmacy. [phone].   Initial call taken by: Melody Comas,  December 04, 2010 11:36 AM  Follow-up for Phone Call        Rx called to pharmacy Follow-up by: Linde Gillis CMA Duncan Dull),  December 04, 2010 11:39 AM    Prescriptions: ZOLPIDEM TARTRATE 10 MG  TABS (ZOLPIDEM TARTRATE) 1/2 or 1 by mouth at hs prn  #30 x 0   Entered and Authorized by:   Ruthe Mannan MD   Signed by:   Ruthe Mannan MD on 12/04/2010   Method used:   Telephoned to ...       [facility]* (retail)       [address]       [place], [place]  [address]       Ph: [phone]       Fax: [phone]   RxID:   [id number]

## 2011-01-30 NOTE — Progress Notes (Signed)
Summary: alprazolam   Phone Note Refill Request Message from:  Fax from Pharmacy on November 01, 2010 1:17 PM  Refills Requested: Medication #1:  ALPRAZOLAM 1 MG TABS 1 tab by mouth three times a day as needed anxiety   Last Refilled: 10/03/2010  Medication #2:  TRAMADOL HCL 50 MG  TABS 1 tab by mouth two times a day as needed pain.   Last Refilled: 10/02/2010 Refill request from [facility] pharmacy. [phone]  Initial call taken by: Melody Comas,  November 01, 2010 1:18 PM    Prescriptions: TRAMADOL HCL 50 MG  TABS (TRAMADOL HCL) 1 tab by mouth two times a day as needed pain  #60 x 0   Entered and Authorized by:   Ruthe Mannan MD   Signed by:   Ruthe Mannan MD on 11/02/2010   Method used:   Electronically to        [facility]* (retail)       [address]       [place], [place]  [address]       Ph: [phone]       Fax: [phone]   RxID:   [id number] ALPRAZOLAM 1 MG TABS (ALPRAZOLAM) 1 tab by mouth three times a day as needed anxiety  #90 x 0   Entered and Authorized by:   Ruthe Mannan MD   Signed by:   Ruthe Mannan MD on 11/01/2010   Method used:   Telephoned to ...       [facility]* (retail)       [address]       [place], [place]  [address]       Ph: [phone]       Fax: [phone]   RxID:   [id number]   Appended Document: alprazolam  Rx Alprazolam was called to pharmacy.

## 2011-01-30 NOTE — Progress Notes (Signed)
   Recieved papers From [employer] through fax. I have forwarded to [facility] for Gelene Mink to Complete.... [facility]  March 21, 2010 11:42 AM

## 2011-01-30 NOTE — Progress Notes (Signed)
Summary: needs something for bladder infection  Phone Note Call from Patient   Caller: Patient Call For: Latoya Mannan MD/ Dr. Alphonsus Sias  Summary of Call: Kendell was seen by Dr. Dayton Martes on 02-21-10 w/ having diarrhea. Patient says that she has history of having bladder infections. She says that w/ having the diarrhea she got some on her while she was sleeping and it cause her to get a bladder infections. She wants to know if she can get something called in for her to [facility] pharmacy without coming in to be seen because she has no way of coming in.  Initial call taken by: Melody Comas,  February 24, 2010 11:08 AM  Follow-up for Phone Call        okay to send Rx for cipro 500mg  two times a day  #6 x 0 if not better next week, needs OV Follow-up by: Cindee Salt MD,  February 24, 2010 11:30 AM  Additional Follow-up for Phone Call Additional follow up Details #1::        Rx Called In, Spoke with patient and advised results.  Additional Follow-up by: Mervin Hack CMA Duncan Dull),  February 24, 2010 12:25 PM    New/Updated Medications: CIPROFLOXACIN HCL 500 MG TABS (CIPROFLOXACIN HCL) take 1 by mouth two times a day x3days Prescriptions: CIPROFLOXACIN HCL 500 MG TABS (CIPROFLOXACIN HCL) take 1 by mouth two times a day x3days  #6 x 0   Entered by:   Mervin Hack CMA (AAMA)   Authorized by:   Cindee Salt MD   Signed by:   Mervin Hack CMA (AAMA) on 02/24/2010   Method used:   Electronically to        [facility]* (retail)       [address]       [place], [place]  [address]       Ph: [phone]       Fax: [phone]   RxID:   [id number]

## 2011-01-30 NOTE — Progress Notes (Signed)
Summary: when to check potassium?  Phone Note Call from Patient Call back at Home Phone [phone]   Caller: Patient Summary of Call: Pt states she has gotten her kayexalate powder, that she will take today.  She is asking when does she need to come in for  potassium check. Initial call taken by: Lowella Petties CMA,  April 10, 2010 11:24 AM  Follow-up for Phone Call        ON weds please. Follow-up by: Ruthe Mannan MD,  April 10, 2010 11:38 AM  Additional Follow-up for Phone Call Additional follow up Details #1::        Do you want to see her or just schedule lab.  Dx. code please? Additional Follow-up by: Delilah Shan CMA Duncan Dull),  April 10, 2010 12:19 PM    Additional Follow-up for Phone Call Additional follow up Details #2::    appointment please.   Follow-up by: Ruthe Mannan MD,  April 10, 2010 1:00 PM  Additional Follow-up for Phone Call Additional follow up Details #3:: Details for Additional Follow-up Action Taken: Left message on voicemail  with appt. on Wednesday, April 13th at 3:45 p.m.  Asked to call the office to reschedule if this is not convenient.  Lugene Fuquay CMA (AAMA)  April 10, 2010 2:26 PM

## 2011-01-30 NOTE — Letter (Signed)
[facility]   Imported By: Lester Kake 06/17/2010 08:31:35  _____________________________________________________________________  External Attachment:    Type:   Image     Comment:   External Document

## 2011-01-30 NOTE — Letter (Signed)
Summary: Letter to Patient Regarding Ambien/[employer] [facility] [place]  Letter to Patient Regarding Ambien/[employer] [facility] [place]   Imported By: Lanelle Bal 05/08/2010 10:42:13  _____________________________________________________________________  External Attachment:    Type:   Image     Comment:   External Document

## 2011-01-30 NOTE — Progress Notes (Signed)
Summary: kionex   Phone Note Call from Patient Call back at Home Phone [phone]   Caller: Patient Call For: Ruthe Mannan MD Summary of Call: Patient wanted to let you know that she went to cancer center and they checked her potassium, she says that it was in the 500's and was given Kionex.  Initial call taken by: Melody Comas,  February 10, 2010 4:38 PM  Follow-up for Phone Call        She must mean over 5 and given kayexelate? Follow-up by: Ruthe Mannan MD,  February 13, 2010 8:15 AM

## 2011-01-30 NOTE — Progress Notes (Signed)
Summary: percocet  Phone Note Refill Request Call back at Home Phone [phone]   Refills Requested: Medication #1:  PERCOCET 10-325 MG  TABS 1 tab by mouth three times a day as needed pain Please call patient when ready.    Method Requested: Pick up at Office Initial call taken by: Melody Comas,  May 03, 2010 10:59 AM    Prescriptions: PERCOCET 10-325 MG  TABS (OXYCODONE-ACETAMINOPHEN) 1 tab by mouth three times a day as needed pain  #90 x 0   Entered and Authorized by:   Ruthe Mannan MD   Signed by:   Ruthe Mannan MD on 05/03/2010   Method used:   Print then Give to Patient   RxID:   [id number]   Appended Document: percocet Patient Advised. Prescription left at front desk.

## 2011-01-30 NOTE — Assessment & Plan Note (Signed)
Summary: ARTHRITIS PAIN 30 M IN PER MD/DLO   Vital Signs:  Patient profile:   60 year old female Height:      63 inches Weight:      123.25 pounds BMI:     21.91 Temp:     98.7 degrees F oral Pulse rate:   64 / minute Pulse rhythm:   regular BP sitting:   130 / 80  (left arm) Cuff size:   regular  Vitals Entered By: Linde Gillis CMA Duncan Dull) (July 05, 2010 11:43 AM) CC: arthritis pain, 30 minute appt   History of Present Illness: 60 yo here for worsening arthritis pain.  Has had chronic pain for years.  Bilateral hands, hips and knees. Has received steroid injections in past, per pt caused her CBGs to spike, she has a h/o very brittle DM.  On multiple medications, including chronic percocet 10-325 mg three times a day. Per past PMD, she does not abuse narcotics and has tried numerous other pain regiments in past.  Joints are never hot or red.  They are frequently swollen.  Most recently, she feels her fingers are hurting almost everyday.  No loss of strength or radiculopathy.  Current Medications (verified): 1)  Gabapentin 300 Mg Tabs (Gabapentin) .... Take 2 Tablet By Mouth Three Times A Day 2)  Lantus 100 Unit/ml Soln (Insulin Glargine) .... Inject 15 Unit Subcutaneously Every Am 3)  Novolog 100 Unit/ml Soln (Insulin Aspart) .... Use Sliding Scale With Meals- (4- 14 Units Four Times Daily Depending On Cbg) 4)  Sertraline Hcl 25 Mg Tabs (Sertraline Hcl) .Marland Kitchen.. 1 Tab By Mouth Daily 5)  Simvastatin 20 Mg Tabs (Simvastatin) .... Take 1 Tablet By Mouth At Bedtime 6)  Topamax 100 Mg Tabs (Topiramate) .Marland Kitchen.. 1 Tab By Mouth Two Times A Day 7)  Onetouch Ultrasoft Lancets   Misc (Lancets) .... Use To Check Blood Sugar Every Morning and 2 Hours After Meals 8)  Prodigy Insulin Syringe 31g X 5/16" 0.5 Ml Misc (Insulin Syringe-Needle U-100) .... Use As Directed Up To 5 Times A Day 9)  Nitroglycerin 0.3 Mg Subl (Nitroglycerin) .Marland Kitchen.. 1 Tab Sl By Mouth Q 5 Min As Needed Chest Pain.  Do Not Exceed  3 Within 15 Min 10)  Onetouch Ultra Test   Strp (Glucose Blood) .... Use As Directed 11)  Percocet 10-325 Mg  Tabs (Oxycodone-Acetaminophen) .Marland Kitchen.. 1 Tab By Mouth Three Times A Day As Needed Pain 12)  Hydrochlorothiazide 12.5 Mg  Tabs (Hydrochlorothiazide) .... Take 3  Tabs  By Mouth Daily 13)  Promethazine Hcl 25 Mg Tabs (Promethazine Hcl) .... 1/2-1 Tab By Mouth Q 6 Hrs As Needed For Nausea 14)  Polyethylene Glycol 3350  Powd (Polyethylene Glycol 3350) .Marland KitchenMarland KitchenMarland Kitchen 17 G Dissolved in Water Daily As Needed Constipation 15)  Prodigy No Coding Blood Gluc  Strp (Glucose Blood) .... Test Blood Sugar 4x A Day 16)  Prodigy Twist Top Lancets 28g  Misc (Lancets) .... Test Blood Sugars 4 X A Day 17)  Pantoprazole Sodium 40 Mg Tbec (Pantoprazole Sodium) .Marland Kitchen.. 1 Tab By Mouth Two Times A Day For Reflux 18)  Nortriptyline Hcl 10 Mg Caps (Nortriptyline Hcl) .... Take 1 Capsule By Mouth At Bedtime 19)  Vitamin D (Ergocalciferol) 50000 Unit Caps (Ergocalciferol) .Marland Kitchen.. 1 Cap By Mouth Once Weekly  X 8 Weeks. 20)  Aciphex 20 Mg Tbec (Rabeprazole Sodium) .... Take 1 Tab Every Morning 21)  Ketoconazole 2 % Sham (Ketoconazole) .... Use As Directed. 22)  Alprazolam 1 Mg  Tabs (Alprazolam) .Marland Kitchen.. 1 Tab By Mouth Three Times A Day As Needed Anxiety 23)  Aspirin 81 Mg  Tabs (Aspirin) .... Take 1 Tablet By Mouth Once A Day 24)  Zolpidem Tartrate 10 Mg  Tabs (Zolpidem Tartrate) .... 1/2 or 1 By Mouth At [facility] Prn 25)  Tramadol Hcl 50 Mg  Tabs (Tramadol Hcl) .Marland Kitchen.. 1 Tab By Mouth Two Times A Day As Needed Pain  Allergies: 1)  ! * Bisphosphonates 2)  ! Hydralazine Hcl 3)  Codeine Phosphate (Codeine Phosphate) 4)  Penicillin G Potassium (Penicillin G Potassium)  Review of Systems      See HPI MS:  Complains of joint pain and joint swelling.  Physical Exam  General:  Well-developed,well-nourished,in no acute distress; alert,appropriate and cooperative throughout examination  Msk:  normal ROM.  enlarged PIP joints and enlarged DIP  joints.  enlarged PIP joints and enlarged DIP joints.   Extremities:  No clubbing, cyanosis, edema, or deformity noted with normal full range of motion of all joints.   Psych:  Cognition and judgment appear intact. Alert and cooperative with normal attention span and concentration. No apparent delusions, illusions, hallucinations   Impression & Recommendations:  Problem # 1:  OSTEOARTHROS UNSPEC GEN/LOC OTH SPEC SITES (ICD-715.98) Assessment Deteriorated  Time spent with patient 25 minutes, more than 50% of this time was spent counseling patient on arthritis. She truly is not a candidate for steroid treatment. She does not want referral to orthopedist to discuss other options, such as knee and hip replacement. Will try using Tramadol with her Percocet for break through pain. Pt in agreement with plan.  Her updated medication list for this problem includes:    Percocet 10-325 Mg Tabs (Oxycodone-acetaminophen) .Marland Kitchen... 1 tab by mouth three times a day as needed pain    Aspirin 81 Mg Tabs (Aspirin) .Marland Kitchen... Take 1 tablet by mouth once a day    Tramadol Hcl 50 Mg Tabs (Tramadol hcl) .Marland Kitchen... 1 tab by mouth two times a day as needed pain  Her updated medication list for this problem includes:    Percocet 10-325 Mg Tabs (Oxycodone-acetaminophen) .Marland Kitchen... 1 tab by mouth three times a day as needed pain    Aspirin 81 Mg Tabs (Aspirin) .Marland Kitchen... Take 1 tablet by mouth once a day    Tramadol Hcl 50 Mg Tabs (Tramadol hcl) .Marland Kitchen... 1 tab by mouth two times a day as needed pain  Complete Medication List: 1)  Gabapentin 300 Mg Tabs (Gabapentin) .... Take 2 tablet by mouth three times a day 2)  Lantus 100 Unit/ml Soln (Insulin glargine) .... Inject 15 unit subcutaneously every am 3)  Novolog 100 Unit/ml Soln (Insulin aspart) .... Use sliding scale with meals- (4- 14 units four times daily depending on cbg) 4)  Sertraline Hcl 25 Mg Tabs (Sertraline hcl) .Marland Kitchen.. 1 tab by mouth daily 5)  Simvastatin 20 Mg Tabs (Simvastatin)  .... Take 1 tablet by mouth at bedtime 6)  Topamax 100 Mg Tabs (Topiramate) .Marland Kitchen.. 1 tab by mouth two times a day 7)  Onetouch Ultrasoft Lancets Misc (Lancets) .... Use to check blood sugar every morning and 2 hours after meals 8)  Prodigy Insulin Syringe 31g X 5/16" 0.5 Ml Misc (Insulin syringe-needle u-100) .... Use as directed up to 5 times a day 9)  Nitroglycerin 0.3 Mg Subl (Nitroglycerin) .Marland Kitchen.. 1 tab sl by mouth q 5 min as needed chest pain.  do not exceed 3 within 15 min 10)  Onetouch Ultra Test Strp (Glucose blood) .Marland KitchenMarland KitchenMarland Kitchen  Use as directed 11)  Percocet 10-325 Mg Tabs (Oxycodone-acetaminophen) .Marland Kitchen.. 1 tab by mouth three times a day as needed pain 12)  Hydrochlorothiazide 12.5 Mg Tabs (Hydrochlorothiazide) .... Take 3  tabs  by mouth daily 13)  Promethazine Hcl 25 Mg Tabs (Promethazine hcl) .... 1/2-1 tab by mouth q 6 hrs as needed for nausea 14)  Polyethylene Glycol 3350 Powd (Polyethylene glycol 3350) .Marland KitchenMarland KitchenMarland Kitchen 17 g dissolved in water daily as needed constipation 15)  Prodigy No Coding Blood Gluc Strp (Glucose blood) .... Test blood sugar 4x a day 16)  Prodigy Twist Top Lancets 28g Misc (Lancets) .... Test blood sugars 4 x a day 17)  Pantoprazole Sodium 40 Mg Tbec (Pantoprazole sodium) .Marland Kitchen.. 1 tab by mouth two times a day for reflux 18)  Nortriptyline Hcl 10 Mg Caps (Nortriptyline hcl) .... Take 1 capsule by mouth at bedtime 19)  Vitamin D (ergocalciferol) 50000 Unit Caps (Ergocalciferol) .Marland Kitchen.. 1 cap by mouth once weekly  x 8 weeks. 20)  Aciphex 20 Mg Tbec (Rabeprazole sodium) .... Take 1 tab every morning 21)  Ketoconazole 2 % Sham (Ketoconazole) .... Use as directed. 22)  Alprazolam 1 Mg Tabs (Alprazolam) .Marland Kitchen.. 1 tab by mouth three times a day as needed anxiety 23)  Aspirin 81 Mg Tabs (Aspirin) .... Take 1 tablet by mouth once a day 24)  Zolpidem Tartrate 10 Mg Tabs (Zolpidem tartrate) .... 1/2 or 1 by mouth at hs prn 25)  Tramadol Hcl 50 Mg Tabs (Tramadol hcl) .Marland Kitchen.. 1 tab by mouth two times a day as  needed pain Prescriptions: TRAMADOL HCL 50 MG  TABS (TRAMADOL HCL) 1 tab by mouth two times a day as needed pain  #60 x 0   Entered and Authorized by:   Ruthe Mannan MD   Signed by:   Ruthe Mannan MD on 07/05/2010   Method used:   Electronically to        [facility]* (retail)       [address]       [place], [place]  [address]       Ph: [phone]       Fax: [phone]   RxID:   [id number]   Current Allergies (reviewed today): ! * BISPHOSPHONATES ! HYDRALAZINE HCL CODEINE PHOSPHATE (CODEINE PHOSPHATE) PENICILLIN G POTASSIUM (PENICILLIN G POTASSIUM)

## 2011-01-30 NOTE — Letter (Signed)
Summary: Out of Work  [facility] [employer] [facility]  [address] [place], [place] [address]   Phone: [phone]  Fax: [phone]    February 08, 2010   Employee:  [employer]    To Whom It May Concern:   For Medical reasons, please excuse [name] from [employer] duty.  She has a diabetic with associated neuropathy and has a difficult time travelling and sitting for long periods of time.    If you need additional information, please feel free to contact our office.         Sincerely,    Ruthe Mannan MD

## 2011-01-30 NOTE — Assessment & Plan Note (Signed)
Summary: Follow up per TMA Salley Scarlet   Vital Signs:  Patient profile:   60 year old female Height:      63 inches Weight:      122.50 pounds BMI:     21.78 Temp:     97.9 degrees F oral Pulse rate:   76 / minute Pulse rhythm:   regular BP sitting:   142 / 80  (left arm) Cuff size:   regular  Vitals Entered By: Linde Gillis CMA Duncan Dull) (April 13, 2010 3:36 PM) CC: follow-up visit, TMA   History of Present Illness: Here for follow up  elevated blood pressure and hyperkalemia.   HTN- very labile BP.  Due to hyperkalemia, d/c'd Lisinopril and started Hydralazine 10 mg po qid on 3/31.  She stopped the Hydralazine on her own last week and restarted the Lisinopril bc Hydralazine caused swelling.    No CP, SOB, blurred vision, LE edema.  Hyperkalemia-   Has been a chronic issue for her due to her hyperglycemia and electrolyte imbalances.  Last K was 5.9.  We had d/c'd lisinopril due to hyperkalemia and started hydralazine. Unfortunately she stopped hydralazine on her own and restarted the lisinopril due to LE edema.  She also has forms for me to fill out today stating she needs to park closer to her house due to chronic illnesses.    Current Medications (verified): 1)  Gabapentin 300 Mg Tabs (Gabapentin) .... Take 2 Tablet By Mouth Three Times A Day 2)  Lantus 100 Unit/ml Soln (Insulin Glargine) .... Inject 15 Unit Subcutaneously Every Am 3)  Novolog 100 Unit/ml Soln (Insulin Aspart) .... Use Sliding Scale With Meals 4)  Sertraline Hcl 25 Mg Tabs (Sertraline Hcl) .Marland Kitchen.. 1 Tab By Mouth Daily 5)  Simvastatin 20 Mg Tabs (Simvastatin) .... Take 1 Tablet By Mouth At Bedtime 6)  Topamax 100 Mg Tabs (Topiramate) .Marland Kitchen.. 1 Tab By Mouth Two Times A Day 7)  Onetouch Ultrasoft Lancets   Misc (Lancets) .... Use To Check Blood Sugar Every Morning and 2 Hours After Meals 8)  Bd Insulin Syringe Ultrafine 29g X 1/2" 0.5 Ml  Misc (Insulin Syringe-Needle U-100) .... Use As Directed Up To 5 Injections Daily Dispense  2 Boxes 9)  Nitroglycerin 0.3 Mg Subl (Nitroglycerin) .Marland Kitchen.. 1 Tab Sl By Mouth Q 5 Min As Needed Chest Pain.  Do Not Exceed 3 Within 15 Min 10)  Onetouch Ultra Test   Strp (Glucose Blood) .... Use As Directed 11)  Percocet 10-325 Mg  Tabs (Oxycodone-Acetaminophen) .Marland Kitchen.. 1 Tab By Mouth Three Times A Day As Needed Pain 12)  Hydrochlorothiazide 12.5 Mg  Tabs (Hydrochlorothiazide) .... Take 3  Tabs  By Mouth Daily 13)  Promethazine Hcl 25 Mg Tabs (Promethazine Hcl) .... 1/2-1 Tab By Mouth Q 6 Hrs As Needed For Nausea 14)  Polyethylene Glycol 3350  Powd (Polyethylene Glycol 3350) .Marland KitchenMarland KitchenMarland Kitchen 17 G Dissolved in Water Daily As Needed Constipation 15)  Prodigy No Coding Blood Gluc  Strp (Glucose Blood) .... Test Blood Sugar 4x A Day 16)  Prodigy Twist Top Lancets 28g  Misc (Lancets) .... Test Blood Sugars 4 X A Day 17)  Pantoprazole Sodium 40 Mg Tbec (Pantoprazole Sodium) .Marland Kitchen.. 1 Tab By Mouth Two Times A Day For Reflux 18)  Nortriptyline Hcl 10 Mg Caps (Nortriptyline Hcl) .... Take 1 Capsule By Mouth At Bedtime 19)  Vitamin D (Ergocalciferol) 50000 Unit Caps (Ergocalciferol) .Marland Kitchen.. 1 Cap By Mouth Once Weekly  X 8 Weeks. 20)  Aciphex 20 Mg Tbec (  Rabeprazole Sodium) .... Take 1 Tab Every Morning 21)  Ketoconazole 2 % Sham (Ketoconazole) .... Use As Directed. 22)  Alprazolam 1 Mg Tabs (Alprazolam) .Marland Kitchen.. 1 Tab By Mouth Three Times A Day As Needed Anxiety 23)  Aspirin 81 Mg  Tabs (Aspirin) .... Take 1 Tablet By Mouth Once A Day 24)  Kayexalate  Powd (Sodium Polystyrene Sulfonate) .Marland Kitchen.. 15gm By Mouth X 1 Then Repeat Tommorow. Will Cause Bowel Movement. 25)  Ambien 5 Mg Tabs (Zolpidem Tartrate) .Marland Kitchen.. 1-2 Tab By Mouth At Bedtime As Needed Insomnia 26)  Kayexalate  Powd (Sodium Polystyrene Sulfonate) .Marland Kitchen.. 15 G By Mouth X 1  Allergies: 1)  ! * Bisphosphonates 2)  ! Hydralazine Hcl 3)  Codeine Phosphate (Codeine Phosphate) 4)  Penicillin G Potassium (Penicillin G Potassium)  Review of Systems      See HPI CV:  Denies chest  pain or discomfort. GU:  Denies dysuria.  Physical Exam  General:  Well-developed,well-nourished,in no acute distress; alert,appropriate and cooperative throughout examination  Mouth:  MMM Lungs:  Normal respiratory effort, chest expands symmetrically. Lungs are clear to auscultation, no crackles or wheezes. Heart:  Normal rate and regular rhythm. S1 and S2 normal without gallop, murmur, click, rub or other extra sounds. Extremities:  No clubbing, cyanosis, edema, or deformity noted with normal full range of motion of all joints.   Psych:  Cognition and judgment appear intact. Alert and cooperative with normal attention span and concentration. No apparent delusions, illusions, hallucinations   Impression & Recommendations:  Problem # 1:  HYPERKALEMIA (ICD-276.7) Assessment Unchanged Time spent with patient 25 minutes, more than 50% of this time was spent discussing this issue.  Ms. Sparkman is very tearful.  She does not have a lot of money and these repeat office visits are really taking a toll on her.  I do believe that her hyperkalemia is due to multiple insults(medications) in combination with chronic hyperglycemia.  I talked to her about importance of STOPPING lisopril. We stopped hydralazine and added it to her allergy list.  We will increase HCTZ dosage (she was actually only taking 12.5 mg daily eventhough directions on bottle stated taking three 12.5 mg tablets.  She cannot read but her sister had been making her pill bottles for her.  She will come for LAB VISIT only in one week and I will help manage her BP and hyperkalemia over the phone to help her save money.  I do need her to come for nurse visits at least to monitor BP since we are changing her meds.  Orders: TLB-BMP (Basic Metabolic Panel-BMET) ([id number])  Complete Medication List: 1)  Gabapentin 300 Mg Tabs (Gabapentin) .... Take 2 tablet by mouth three times a day 2)  Lantus 100 Unit/ml Soln (Insulin glargine) ....  Inject 15 unit subcutaneously every am 3)  Novolog 100 Unit/ml Soln (Insulin aspart) .... Use sliding scale with meals 4)  Sertraline Hcl 25 Mg Tabs (Sertraline hcl) .Marland Kitchen.. 1 tab by mouth daily 5)  Simvastatin 20 Mg Tabs (Simvastatin) .... Take 1 tablet by mouth at bedtime 6)  Topamax 100 Mg Tabs (Topiramate) .Marland Kitchen.. 1 tab by mouth two times a day 7)  Onetouch Ultrasoft Lancets Misc (Lancets) .... Use to check blood sugar every morning and 2 hours after meals 8)  Bd Insulin Syringe Ultrafine 29g X 1/2" 0.5 Ml Misc (Insulin syringe-needle u-100) .... Use as directed up to 5 injections daily dispense 2 boxes 9)  Nitroglycerin 0.3 Mg Subl (Nitroglycerin) .Marland Kitchen.. 1 tab sl  by mouth q 5 min as needed chest pain.  do not exceed 3 within 15 min 10)  Onetouch Ultra Test Strp (Glucose blood) .... Use as directed 11)  Percocet 10-325 Mg Tabs (Oxycodone-acetaminophen) .Marland Kitchen.. 1 tab by mouth three times a day as needed pain 12)  Hydrochlorothiazide 12.5 Mg Tabs (Hydrochlorothiazide) .... Take 3  tabs  by mouth daily 13)  Promethazine Hcl 25 Mg Tabs (Promethazine hcl) .... 1/2-1 tab by mouth q 6 hrs as needed for nausea 14)  Polyethylene Glycol 3350 Powd (Polyethylene glycol 3350) .Marland KitchenMarland KitchenMarland Kitchen 17 g dissolved in water daily as needed constipation 15)  Prodigy No Coding Blood Gluc Strp (Glucose blood) .... Test blood sugar 4x a day 16)  Prodigy Twist Top Lancets 28g Misc (Lancets) .... Test blood sugars 4 x a day 17)  Pantoprazole Sodium 40 Mg Tbec (Pantoprazole sodium) .Marland Kitchen.. 1 tab by mouth two times a day for reflux 18)  Nortriptyline Hcl 10 Mg Caps (Nortriptyline hcl) .... Take 1 capsule by mouth at bedtime 19)  Vitamin D (ergocalciferol) 50000 Unit Caps (Ergocalciferol) .Marland Kitchen.. 1 cap by mouth once weekly  x 8 weeks. 20)  Aciphex 20 Mg Tbec (Rabeprazole sodium) .... Take 1 tab every morning 21)  Ketoconazole 2 % Sham (Ketoconazole) .... Use as directed. 22)  Alprazolam 1 Mg Tabs (Alprazolam) .Marland Kitchen.. 1 tab by mouth three times a day as  needed anxiety 23)  Aspirin 81 Mg Tabs (Aspirin) .... Take 1 tablet by mouth once a day 24)  Kayexalate Powd (Sodium polystyrene sulfonate) .Marland Kitchen.. 15gm by mouth x 1 then repeat tommorow. will cause bowel movement. 25)  Ambien 5 Mg Tabs (Zolpidem tartrate) .Marland Kitchen.. 1-2 tab by mouth at bedtime as needed insomnia 26)  Kayexalate Powd (Sodium polystyrene sulfonate) .Marland Kitchen.. 15 g by mouth x 1  Patient Instructions: 1)  I want you to take 3 tablets hydrochlorothiazide daily (instead of two). 2)  Stop the lisinopril.  Current Allergies (reviewed today): ! * BISPHOSPHONATES ! HYDRALAZINE HCL CODEINE PHOSPHATE (CODEINE PHOSPHATE) PENICILLIN G POTASSIUM (PENICILLIN G POTASSIUM)

## 2011-01-30 NOTE — Progress Notes (Signed)
Summary: Gabapentin  Phone Note Refill Request Message from:  Fax from Pharmacy on February 03, 2010 10:10 AM  Refills Requested: Medication #1:  GABAPENTIN 300 MG TABS Take 2 tablet by mouth three times a day [facility]  Phone:   [phone]   Method Requested: Electronic Initial call taken by: Delilah Shan CMA (AAMA),  February 03, 2010 10:10 AM    Prescriptions: GABAPENTIN 300 MG TABS (GABAPENTIN) Take 2 tablet by mouth three times a day  #180 x 6   Entered and Authorized by:   Ruthe Mannan MD   Signed by:   Ruthe Mannan MD on 02/03/2010   Method used:   Electronically to        [facility]* (retail)       [address]       [place], [place]  [address]       Ph: [phone]       Fax: [phone]   RxID:   [id number]

## 2011-01-30 NOTE — Assessment & Plan Note (Signed)
Summary: F/U critical lab (K+) per AEB Salley Scarlet   Vital Signs:  Patient profile:   60 year old female Height:      63 inches Weight:      113.4 pounds BMI:     20.16 Temp:     97.7 degrees F oral Pulse rate:   84 / minute Pulse rhythm:   regular BP sitting:   110 / 68  (left arm) Cuff size:   regular  Vitals Entered By: Benny Lennert CMA Duncan Dull) (March 02, 2010 11:43 AM)  History of Present Illness: Chief complaint follow up critical lab l k  Acutely high BS  K high, but in the face of BS > 500, K was at 6  Patient has been asymptomatic throughout Had been drinking a lot of pedialyte s/p kayexalate x 2 doses  Really brittle diabetic. Has been in the hospital in [place] multiple times. Wildly uncontrolled DM.  Right now is taking Lantus 15 units a day. One day it was 80 this week. Sometimes 80-120, yesterday was > 500.  Novolog using a sliding scale, will check it before eating three times a day then decide how much insulin to use She cannot tell me how much - I ask her every way I know how.  Patient had a virus - drank some pedialyte. and drank that.   Allergies: 1)  ! * Bisphosphonates 2)  Codeine Phosphate (Codeine Phosphate) 3)  Penicillin G Potassium (Penicillin G Potassium)  Past History:  Past medical, surgical, family and social histories (including risk factors) reviewed, and no changes noted (except as noted below).  Past Medical History: Reviewed history from 06/03/2009 and no changes required. carotid stenosis, Carotid CT on 05-25-09 shows no stenosis.  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 PTCA - 10/29/2006 Hysterectomy at age 62....says she has had pap smears since (?partial) Rectocele, bladder tack remote past  Family History: Reviewed history from 01/06/2010 and no changes required. Mother with ovarian cancer Family History of Cirrhosis: Father Family History of Diabetes: Paternal Grandmother  Family History of Colon Cancer:Maternal Aunt and Maternal Uncle  Family History of Breast Cancer:Sister  Family History of Colon Polyps:Mother  Family History of Colitis: Sister  Family History of Heart Disease: Mother   Social History: Reviewed history from 01/06/2010 and no changes required. Never smoked.   Not sexually active.   Cannot read Occupation: Disabled Divorced Childern Alcohol Use - no Illicit Drug Use - no  Review of Systems       virus resolving, no fever, eating ok now. no n/v. No uri sx  Physical Exam  General:  Well-developed,well-nourished,in no acute distress; alert,appropriate and cooperative throughout examination Head:  Normocephalic and atraumatic without obvious abnormalities. No apparent alopecia or balding. Ears:  no external deformities.   Nose:  no external deformity.   Lungs:  Normal respiratory effort, chest expands symmetrically. Lungs are clear to auscultation, no crackles or wheezes. Heart:  Normal rate and regular rhythm. S1 and S2 normal without gallop, murmur, click, rub or other extra sounds. Extremities:  No clubbing, cyanosis, edema, or deformity noted with normal full range of motion of  all joints.   Neurologic:  alert & oriented X3 and gait normal.   Cervical Nodes:  No lymphadenopathy noted Psych:  Cognition and judgment appear intact. Alert and cooperative with normal attention span and concentration. No apparent delusions, illusions,  hallucinations   Impression & Recommendations:  Problem # 1:  HYPERKALEMIA (ICD-276.7) Assessment New Recheck K asymptomatic  In face of taking in a lot of electrolyte solution and with a BS > 550. 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 This patient cannot read and I forsee signficant problems in her managing her insulin. I asked every way I could think of, but could not clearly define how much Novolog she is using.  I want to get her primary care MD involved in the care of her DM and in this case, I would consult Endocrine. An insulin pump may be an appropriate consideration.  Her updated medication list for this problem includes:    Lantus 100 Unit/ml Soln (Insulin glargine) ..... Inject 15 unit subcutaneously every am    Novolog 100 Unit/ml Soln (Insulin aspart) ..... Use sliding scale with meals    Lisinopril 20 Mg Tabs (Lisinopril) .Marland Kitchen... 1 tab by mouth daily for blood pressure  Orders: Venipuncture (16109) TLB-BMP (Basic Metabolic Panel-BMET) (80048-METABOL) Glucose, (CBG) (60454)  Complete Medication List: 1)  Gabapentin 300 Mg Tabs (Gabapentin) .... Take 2 tablet by mouth three times a day 2)  Lantus 100 Unit/ml Soln (Insulin glargine) .... Inject 15 unit subcutaneously every am 3)  Novolog 100 Unit/ml Soln (Insulin aspart) .... Use sliding scale with meals 4)  Sertraline Hcl 25 Mg Tabs (Sertraline hcl) .Marland Kitchen.. 1 tab by mouth daily 5)  Simvastatin 20 Mg Tabs (Simvastatin) .... Take 1 tablet by mouth at bedtime 6)  Topamax 100 Mg Tabs (Topiramate) .Marland Kitchen.. 1 tab by mouth two times a day 7)  Onetouch Ultrasoft Lancets Misc (Lancets) .... Use to check blood sugar every morning and 2 hours after meals 8)  Bd Insulin Syringe Ultrafine 29g X 1/2" 0.5 Ml Misc  (Insulin syringe-needle u-100) .... Use as directed up to 5 injections daily dispense 2 boxes 9)  Nitroglycerin 0.3 Mg Subl (Nitroglycerin) .Marland Kitchen.. 1 tab sl by mouth q 5 min as needed chest pain.  do not exceed 3 within 15 min 10)  Onetouch Ultra Test Strp (Glucose blood) .... Use as directed 11)  Percocet 10-325 Mg Tabs (Oxycodone-acetaminophen) .Marland Kitchen.. 1 tab by mouth three times a day as needed pain 12)  Hydrochlorothiazide 12.5 Mg Tabs (Hydrochlorothiazide) .Marland Kitchen.. 1 tab by mouth daily 13)  Promethazine Hcl 25 Mg Tabs (Promethazine hcl) .... 1/2-1 tab by mouth q 6 hrs as needed for nausea 14)  Polyethylene Glycol 3350 Powd (Polyethylene glycol 3350) .Marland KitchenMarland KitchenMarland Kitchen 17 g dissolved in water daily as needed constipation 15)  Prodigy No Coding Blood Gluc Strp (Glucose blood) .... Test blood sugar 4x a day 16)  Prodigy Twist Top Lancets 28g Misc (Lancets) .... Test blood sugars 4 x a day 17)  Pantoprazole Sodium 40 Mg Tbec (Pantoprazole sodium) .Marland Kitchen.. 1 tab by mouth two times a day for reflux 18)  Lisinopril 20 Mg Tabs (Lisinopril) .Marland Kitchen.. 1 tab by mouth daily for blood pressure 19)  Nortriptyline Hcl 10 Mg Caps (Nortriptyline hcl) .... Take 1 capsule by mouth at bedtime  20)  Vitamin D (ergocalciferol) 50000 Unit Caps (Ergocalciferol) .Marland Kitchen.. 1 cap by mouth once weekly  x 8 weeks. 21)  Aciphex 20 Mg Tbec (Rabeprazole sodium) .... Take 1 tab every morning 22)  Ketoconazole 2 % Sham (Ketoconazole) .... Use as directed. 23)  Alprazolam 1 Mg Tabs (Alprazolam) .Marland Kitchen.. 1 tab by mouth three times a day as needed anxiety 24)  Ciprofloxacin Hcl 500 Mg Tabs (Ciprofloxacin hcl) .... Take 1 by mouth two times a day x3days 25)  Kayexalate Powd (Sodium polystyrene sulfonate) .Marland Kitchen.. 15 gm by mouth tonigh and repeat again in am  Patient Instructions: 1)  f/u Dr. Dayton Martes next week  Current Allergies (reviewed today): ! * BISPHOSPHONATES CODEINE PHOSPHATE (CODEINE PHOSPHATE) PENICILLIN G POTASSIUM (PENICILLIN G POTASSIUM)  Laboratory Results    Blood Tests     Glucose (random): 445 mg/dL   (Normal Range: 04-540)

## 2011-01-30 NOTE — Progress Notes (Signed)
Summary: refill requests for ambien, xanax, phenergan  Phone Note Refill Request Message from:  Fax from Pharmacy  Refills Requested: Medication #1:  PROMETHAZINE HCL 25 MG TABS 1/2-1 tab by mouth q 6 hrs as needed for nausea   Last Refilled: 06/08/2010  Medication #2:  ALPRAZOLAM 1 MG TABS 1 tab by mouth three times a day as needed anxiety   Last Refilled: 06/08/2010  Medication #3:  ZOLPIDEM TARTRATE 10 MG  TABS 1/2 or 1 by mouth at hs prn   Last Refilled: 06/06/2010 Faxed requests from [employer].  [phone].   Follow-up for Phone Call        Rx's called to pharmacy Follow-up by: Linde Gillis CMA Duncan Dull),  July 05, 2010 12:43 PM    Prescriptions: ZOLPIDEM TARTRATE 10 MG  TABS (ZOLPIDEM TARTRATE) 1/2 or 1 by mouth at hs prn  #30 x 0   Entered and Authorized by:   Ruthe Mannan MD   Signed by:   Ruthe Mannan MD on 07/05/2010   Method used:   Telephoned to ...       [facility]* (retail)       [address]       [place], [place]  [address]       Ph: [phone]       Fax: [phone]   RxID:   [id number] ALPRAZOLAM 1 MG TABS (ALPRAZOLAM) 1 tab by mouth three times a day as needed anxiety  #90 x 0   Entered and Authorized by:   Ruthe Mannan MD   Signed by:   Ruthe Mannan MD on 07/05/2010   Method used:   Telephoned to ...       [facility]* (retail)       [address]       [place], [place]  [address]       Ph: [phone]       Fax: [phone]   RxID:   [id number] PROMETHAZINE HCL 25 MG TABS (PROMETHAZINE HCL) 1/2-1 tab by mouth q 6 hrs as needed for nausea  #30 Tablet x 0   Entered and Authorized by:   Ruthe Mannan MD   Signed by:   Ruthe Mannan MD on 07/05/2010   Method used:   Telephoned to ...       [facility]* (retail)       [address]       [place], [place]  [address]       Ph: [phone]       Fax: [phone]   RxID:   [id number]

## 2011-01-30 NOTE — Consult Note (Signed)
Summary: [facility]   Imported By: Lanelle Bal 02/24/2010 09:32:16  _____________________________________________________________________  External Attachment:    Type:   Image     Comment:   External Document

## 2011-01-30 NOTE — Progress Notes (Signed)
Summary: Clonazepam  Phone Note Refill Request Message from:  Fax from Pharmacy on February 02, 2010 10:25 AM  Refills Requested: Medication #1:  CLONAZEPAM 0.5 MG TABS 1 tab by mouth bid [facility]  Phone:   [phone]   Method Requested: Telephone to Pharmacy Initial call taken by: Delilah Shan CMA Duncan Dull),  February 02, 2010 10:26 AM  Follow-up for Phone Call        Medication phoned to pharmacy.  Follow-up by: Delilah Shan CMA (AAMA),  February 02, 2010 11:00 AM    Prescriptions: CLONAZEPAM 0.5 MG TABS (CLONAZEPAM) 1 tab by mouth bid  #60 x 0   Entered and Authorized by:   Ruthe Mannan MD   Signed by:   Ruthe Mannan MD on 02/02/2010   Method used:   Handwritten   RxID:   [id number]

## 2011-01-30 NOTE — Progress Notes (Signed)
Summary: percocet   Phone Note Refill Request Call back at Home Phone [phone] Message from:  Fax from Pharmacy on December 01, 2010 11:13 AM  Refills Requested: Medication #1:  PERCOCET 10-325 MG  TABS 1 tab by mouth three times a day as needed pain Please call patient when rx is ready.    Method Requested: Pick up at Office Initial call taken by: Melody Comas,  December 01, 2010 11:14 AM  Follow-up for Phone Call        please give to patient.  Follow-up by: Crawford Givens MD,  December 01, 2010 1:56 PM  Additional Follow-up for Phone Call Additional follow up Details #1::        Patient Advised. Prescription left at front desk.  Additional Follow-up by: Delilah Shan CMA (AAMA),  December 01, 2010 3:52 PM    Prescriptions: PERCOCET 10-325 MG  TABS (OXYCODONE-ACETAMINOPHEN) 1 tab by mouth three times a day as needed pain  #90 x 0   Entered by:   Crawford Givens MD   Authorized by:   Ruthe Mannan MD   Signed by:   Crawford Givens MD on 12/01/2010   Method used:   Print then Give to Patient   RxID:   [id number]

## 2011-01-30 NOTE — Progress Notes (Signed)
Summary: Prior Authorization Zolpidem Tartrate  Phone Note From Pharmacy Call back at ph [phone] fax [phone]   Call placed by: Linde Gillis CMA Duncan Dull),  April 07, 2010 10:28 AM Call placed to: [facility] Summary of Call: Received fax form form pharmac Caller: [facility]* Call For: Dr. Dayton Martes  Summary of Call: Received fax from pharmacy stating that PA is needed for Zolpidem Tartrate 5mg .  Called [facility] at [phone] and spoke to [facility] who will be faxing over the form today.  Linde Gillis CMA Duncan Dull)  April 07, 2010 10:30 AM   Received PA form, in your IN box. Initial call taken by: Linde Gillis CMA Duncan Dull),  April 07, 2010 1:27 PM  Follow-up for Phone Call        On my desk. Follow-up by: Ruthe Mannan MD,  April 07, 2010 1:43 PM  Additional Follow-up for Phone Call Additional follow up Details #1::        Form faxed to [facility] Medicaid at [phone]. Additional Follow-up by: Linde Gillis CMA Duncan Dull),  April 07, 2010 1:47 PM     Appended Document: Prior Authorization Zolpidem Tartrate Called [place] [employer] to check the status of PA on Zolipidem Tartrate, was informed by Aram Beecham that PA was denied on 04/07/2010.  She stated that the system does not give a reason for the denial but a letter should have been faxed to our office with the reason for the denial listed.  I asked if the denial letter could be faxed again or mailed and Aram Beecham stated that she cannot fax the denial letter because it did not come from there system.

## 2011-01-30 NOTE — Progress Notes (Signed)
Summary: Attorney from Legal Aid  Phone Note Other Incoming   Caller: Lorenza Burton, Attorney for Legal Aid Summary of Call: Patient has contacted this attorney saying that her medication plan has denied her Ambien.  [place] told the attorney that she had spoken to you about it and that you were going to appeal the deicision.  Patient was supposed to have called here to give permission for [place] to talk to this attorney but I don't see that permission in EMR.  The attorney did leave a number if you would like to speak with her.  She says she can probably best be reached on her cell phone.  It is not for client use but you may use it.  [phone].  The office number is [phone] but she says it is hard to get through to this number.   Initial call taken by: Delilah Shan CMA Duncan Dull),  April 24, 2010 4:21 PM  Follow-up for Phone Call        Pt called explaining why the lawyer had called yesterday.  She gave verbal permission for you to talk to the lawyer. Follow-up by: Lowella Petties CMA,  April 25, 2010 9:33 AM  Additional Follow-up for Phone Call Additional follow up Details #1::        Left VM on attorney's cell to call us back. Ruthe Mannan MD  April 25, 2010 10:01 AM Left another VM today. Ruthe Mannan MD  May 02, 2010 12:27 PM

## 2011-01-30 NOTE — Assessment & Plan Note (Signed)
Summary: [name] [name]   Vital Signs:  Patient profile:   60 year old female Height:      63 inches Weight:      120 pounds BMI:     21.33 Temp:     97.6 degrees F oral Pulse rate:   68 / minute Pulse rhythm:   regular BP sitting:   152 / 68  (left arm) Cuff size:   large  Vitals Entered By: Delilah Shan CMA Duncan Dull) (March 23, 2010 2:05 PM) CC: follow-up visit per Dr. Dayton Martes   History of Present Illness: Here for follow up  elevated blood pressure and hyperkalemia.   HTN- BP extremely elevated on 3/10-- 222/82, Given Clonidine 0/2, came down to 162/78.  At follow up visit on 3/14, BP was 112/60.  Did not take BP meds today.  Today, BP mildly elevated.  She thinks that she was is very anxious still about being evicted from her house.  Owners of apartment have been threatening to evict her because of her threatening other tennants.  Hyperkalemia-   Has been a chronic issue for her due to her hyperglycemia and electrolyte imbalances.  DM- has had some low CBGs in 40s again this week but always asymptomatic, highest has been 300.  This remains a chronic issue.  Current Medications (verified): 1)  Gabapentin 300 Mg Tabs (Gabapentin) .... Take 2 Tablet By Mouth Three Times A Day 2)  Lantus 100 Unit/ml Soln (Insulin Glargine) .... Inject 15 Unit Subcutaneously Every Am 3)  Novolog 100 Unit/ml Soln (Insulin Aspart) .... Use Sliding Scale With Meals 4)  Sertraline Hcl 25 Mg Tabs (Sertraline Hcl) .Marland Kitchen.. 1 Tab By Mouth Daily 5)  Simvastatin 20 Mg Tabs (Simvastatin) .... Take 1 Tablet By Mouth At Bedtime 6)  Topamax 100 Mg Tabs (Topiramate) .Marland Kitchen.. 1 Tab By Mouth Two Times A Day 7)  Onetouch Ultrasoft Lancets   Misc (Lancets) .... Use To Check Blood Sugar Every Morning and 2 Hours After Meals 8)  Bd Insulin Syringe Ultrafine 29g X 1/2" 0.5 Ml  Misc (Insulin Syringe-Needle U-100) .... Use As Directed Up To 5 Injections Daily Dispense 2 Boxes 9)  Nitroglycerin 0.3 Mg Subl (Nitroglycerin) .Marland Kitchen..  1 Tab Sl By Mouth Q 5 Min As Needed Chest Pain.  Do Not Exceed 3 Within 15 Min 10)  Onetouch Ultra Test   Strp (Glucose Blood) .... Use As Directed 11)  Percocet 10-325 Mg  Tabs (Oxycodone-Acetaminophen) .Marland Kitchen.. 1 Tab By Mouth Three Times A Day As Needed Pain 12)  Hydrochlorothiazide 12.5 Mg  Tabs (Hydrochlorothiazide) .... Take 3  Tabs  By Mouth Daily 13)  Promethazine Hcl 25 Mg Tabs (Promethazine Hcl) .... 1/2-1 Tab By Mouth Q 6 Hrs As Needed For Nausea 14)  Polyethylene Glycol 3350  Powd (Polyethylene Glycol 3350) .Marland KitchenMarland KitchenMarland Kitchen 17 G Dissolved in Water Daily As Needed Constipation 15)  Prodigy No Coding Blood Gluc  Strp (Glucose Blood) .... Test Blood Sugar 4x A Day 16)  Prodigy Twist Top Lancets 28g  Misc (Lancets) .... Test Blood Sugars 4 X A Day 17)  Pantoprazole Sodium 40 Mg Tbec (Pantoprazole Sodium) .Marland Kitchen.. 1 Tab By Mouth Two Times A Day For Reflux 18)  Lisinopril 20 Mg Tabs (Lisinopril) .Marland Kitchen.. 1 Tab By Mouth Daily For Blood Pressure 19)  Nortriptyline Hcl 10 Mg Caps (Nortriptyline Hcl) .... Take 1 Capsule By Mouth At Bedtime 20)  Vitamin D (Ergocalciferol) 50000 Unit Caps (Ergocalciferol) .Marland Kitchen.. 1 Cap By Mouth Once Weekly  X 8  Weeks. 21)  Aciphex 20 Mg Tbec (Rabeprazole Sodium) .... Take 1 Tab Every Morning 22)  Ketoconazole 2 % Sham (Ketoconazole) .... Use As Directed. 23)  Alprazolam 1 Mg Tabs (Alprazolam) .Marland Kitchen.. 1 Tab By Mouth Three Times A Day As Needed Anxiety 24)  Aspirin 81 Mg  Tabs (Aspirin) .... Take 1 Tablet By Mouth Once A Day 25)  Kayexalate  Powd (Sodium Polystyrene Sulfonate) .Marland Kitchen.. 15gm By Mouth X 1 Then Repeat Tommorow. Will Cause Bowel Movement.  Allergies: 1)  ! * Bisphosphonates 2)  Codeine Phosphate (Codeine Phosphate) 3)  Penicillin G Potassium (Penicillin G Potassium)  Review of Systems      See HPI General:  Denies malaise. Eyes:  Denies blurring. CV:  Denies chest pain or discomfort. Resp:  Denies shortness of breath. Neuro:  Denies headaches, tremors, and visual  disturbances.  Physical Exam  General:  Well-developed,well-nourished,in no acute distress; alert,appropriate and cooperative throughout examination Mouth:  MMM Lungs:  Normal respiratory effort, chest expands symmetrically. Lungs are clear to auscultation, no crackles or wheezes. Heart:  Normal rate and regular rhythm. S1 and S2 normal without gallop, murmur, click, rub or other extra sounds. Extremities:  No clubbing, cyanosis, edema, or deformity noted with normal full range of motion of all joints.   Psych:  Cognition and judgment appear intact. Alert and cooperative with normal attention span and concentration. No apparent delusions, illusions, hallucinations   Impression & Recommendations:  Problem # 1:  HYPERKALEMIA (ICD-276.7) Assessment Unchanged Chronic issue due to brittle diabetes and electrolyte imbalances.  REcheck Potassium today. Orders: Venipuncture (16109) TLB-BMP (Basic Metabolic Panel-BMET) (80048-METABOL)  Problem # 2:  HYPERTENSION, BENIGN SYSTEMIC (ICD-401.1) Assessment: Deteriorated Admits to being anxious and not taking meds today.  Trend has been improved over last 2 office visits.  Continue to monitor closely. Her updated medication list for this problem includes:    Hydrochlorothiazide 12.5 Mg Tabs (Hydrochlorothiazide) .Marland Kitchen... Take 3  tabs  by mouth daily    Lisinopril 20 Mg Tabs (Lisinopril) .Marland Kitchen... 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  It is something she is till not sure about since she has seen them in past.  She wants to discuss with her sister and let me know. Her updated medication list for this problem includes:    Lantus 100 Unit/ml Soln (Insulin glargine) ..... Inject 15 unit subcutaneously every am    Novolog 100 Unit/ml Soln (Insulin aspart) ..... Use sliding scale with meals     Lisinopril 20 Mg Tabs (Lisinopril) .Marland Kitchen... 1 tab by mouth daily for blood pressure    Aspirin 81 Mg Tabs (Aspirin) .Marland Kitchen... Take 1 tablet by mouth once a day  Complete Medication List: 1)  Gabapentin 300 Mg Tabs (Gabapentin) .... Take 2 tablet by mouth three times a day 2)  Lantus 100 Unit/ml Soln (Insulin glargine) .... Inject 15 unit subcutaneously every am 3)  Novolog 100 Unit/ml Soln (Insulin aspart) .... Use sliding scale with meals 4)  Sertraline Hcl 25 Mg Tabs (Sertraline hcl) .Marland Kitchen.. 1 tab by mouth daily 5)  Simvastatin 20 Mg Tabs (Simvastatin) .... Take 1 tablet by mouth at bedtime 6)  Topamax 100 Mg Tabs (Topiramate) .Marland Kitchen.. 1 tab by mouth two times a day 7)  Onetouch Ultrasoft Lancets Misc (Lancets) .... Use to check blood sugar every morning and 2 hours after meals 8)  Bd Insulin  Syringe Ultrafine 29g X 1/2" 0.5 Ml Misc (Insulin syringe-needle u-100) .... Use as directed up to 5 injections daily dispense 2 boxes 9)  Nitroglycerin 0.3 Mg Subl (Nitroglycerin) .Marland Kitchen.. 1 tab sl by mouth q 5 min as needed chest pain.  do not exceed 3 within 15 min 10)  Onetouch Ultra Test Strp (Glucose blood) .... Use as directed 11)  Percocet 10-325 Mg Tabs (Oxycodone-acetaminophen) .Marland Kitchen.. 1 tab by mouth three times a day as needed pain 12)  Hydrochlorothiazide 12.5 Mg Tabs (Hydrochlorothiazide) .... Take 3  tabs  by mouth daily 13)  Promethazine Hcl 25 Mg Tabs (Promethazine hcl) .... 1/2-1 tab by mouth q 6 hrs as needed for nausea 14)  Polyethylene Glycol 3350 Powd (Polyethylene glycol 3350) .Marland KitchenMarland KitchenMarland Kitchen 17 g dissolved in water daily as needed constipation 15)  Prodigy No Coding Blood Gluc Strp (Glucose blood) .... Test blood sugar 4x a day 16)  Prodigy Twist Top Lancets 28g Misc (Lancets) .... Test blood sugars 4 x a day 17)  Pantoprazole Sodium 40 Mg Tbec (Pantoprazole sodium) .Marland Kitchen.. 1 tab by mouth two times a day for reflux 18)  Lisinopril 20 Mg Tabs (Lisinopril) .Marland Kitchen.. 1 tab by mouth daily for blood pressure 19)   Nortriptyline Hcl 10 Mg Caps (Nortriptyline hcl) .... Take 1 capsule by mouth at bedtime 20)  Vitamin D (ergocalciferol) 50000 Unit Caps (Ergocalciferol) .Marland Kitchen.. 1 cap by mouth once weekly  x 8 weeks. 21)  Aciphex 20 Mg Tbec (Rabeprazole sodium) .... Take 1 tab every morning 22)  Ketoconazole 2 % Sham (Ketoconazole) .... Use as directed. 23)  Alprazolam 1 Mg Tabs (Alprazolam) .Marland Kitchen.. 1 tab by mouth three times a day as needed anxiety 24)  Aspirin 81 Mg Tabs (Aspirin) .... Take 1 tablet by mouth once a day 25)  Kayexalate Powd (Sodium polystyrene sulfonate) .Marland Kitchen.. 15gm by mouth x 1 then repeat tommorow. will cause bowel movement.  Current Allergies (reviewed today): ! * BISPHOSPHONATES CODEINE PHOSPHATE (CODEINE PHOSPHATE) PENICILLIN G POTASSIUM (PENICILLIN G POTASSIUM)

## 2011-01-30 NOTE — Progress Notes (Signed)
Summary: refill request for percocet  Phone Note Refill Request Call back at Home Phone [phone] Message from:  Patient  Refills Requested: Medication #1:  PERCOCET 10-325 MG  TABS 1 tab by mouth three times a day as needed pain Please call pt when ready.  Initial call taken by: Lowella Petties CMA,  October 02, 2010 3:53 PM  Follow-up for Phone Call        Patient advised as instructed via telephone, Rx left at front desk for pick up. Follow-up by: Linde Gillis CMA Duncan Dull),  October 02, 2010 4:30 PM    Prescriptions: PERCOCET 10-325 MG  TABS (OXYCODONE-ACETAMINOPHEN) 1 tab by mouth three times a day as needed pain  #90 x 0   Entered and Authorized by:   Ruthe Mannan MD   Signed by:   Ruthe Mannan MD on 10/02/2010   Method used:   Print then Give to Patient   RxID:   [id number]

## 2011-01-30 NOTE — Progress Notes (Signed)
Summary: potassium  Phone Note Call from Patient Call back at Home Phone [phone]   Caller: Patient Call For: Ruthe Mannan MD Summary of Call: Patient wanted to let you know that she has been getting the shots over at the cancer center and she wants to know if that could be what is causing her potassium to be so high.  Initial call taken by: Melody Comas,  April 14, 2010 1:46 PM  Follow-up for Phone Call        Aranesp can cause elevated potassium in some people, but they usually have bad kidneys.  When is she scheduled to get another injection?  When she goes back, we need to send our labs to them. Follow-up by: Ruthe Mannan MD,  April 14, 2010 1:51 PM  Additional Follow-up for Phone Call Additional follow up Details #1::        She is scheduled for her next injection on May 6th.  Melody Comas  April 14, 2010 2:13 PM   faxed as instructed  Additional Follow-up by: Melody Comas,  April 14, 2010 4:05 PM    Additional Follow-up for Phone Call Additional follow up Details #2::    ok, please send labs to cancer center. Follow-up by: Ruthe Mannan MD,  April 14, 2010 3:08 PM

## 2011-01-30 NOTE — Progress Notes (Signed)
  Phone Note Outgoing Call   Call placed by: Ruthe Mannan MD,  May 04, 2010 11:22 AM Summary of Call: Called pt back.  She had called this morning to state that she thinks she is having a brain aneurysm.  Got up a few months ago and reached for phone, tripped fell and hit on concrete.  Since then, she feels unsteady when she walks.  Sister had aneurysm and she is worried she has one.  No other focal neurological deficits.  She is having mild headaches.  She is very concerned and wants a head CT.  She thinks she was told in past that she has one.   Explained to her that is unlikely that she has a ruptured one at this point but we can get a CT for reassurance. Initial call taken by: Ruthe Mannan MD,  May 04, 2010 11:30 AM  New Problems: HEAD TRAUMA (ICD-959.01)   New Problems: HEAD TRAUMA (ICD-959.01)

## 2011-01-30 NOTE — Progress Notes (Signed)
----   Converted from flag ---- ---- 03/10/2010 1:41 PM, Ruthe Mannan MD wrote: This has been an issue for her since her diabetes is so brittle- really high one minute than low the next.  I just tried calling her home number, no answer.  Please try to call her again and ask her to go to the ER.  They probably need to admit her to figure out how to best control her diabetes and electrolytes.  ---- 03/10/2010 1:33 PM, Delilah Shan CMA (AAMA) wrote: Lawson Fiscal at the Lab at [facility] called in a critical reading on the above patient.  Potassium 6.1.  The report is coming through EMR. ------------------------------

## 2011-01-30 NOTE — Progress Notes (Signed)
Summary: Rx Tramadol & Alprazolam  Phone Note Refill Request Call back at [phone] Message from:  [facility] pharmacy on September 05, 2010 11:17 AM  Refills Requested: Medication #1:  TRAMADOL HCL 50 MG  TABS 1 tab by mouth two times a day as needed pain.   Last Refilled: 07/31/2010  Medication #2:  ALPRAZOLAM 1 MG TABS 1 tab by mouth three times a day as needed anxiety   Last Refilled: 07/31/2010 Received faxed refill request please advise.  Dr. Dayton Martes is out of the office until Thursday and patient says she will run out before then.   Method Requested: Telephone to Pharmacy Initial call taken by: Linde Gillis CMA Duncan Dull),  September 05, 2010 11:20 AM  Follow-up for Phone Call        please call in.  Follow-up by: Crawford Givens MD,  September 05, 2010 1:38 PM  Additional Follow-up for Phone Call Additional follow up Details #1::        Rx called to pharmacy Additional Follow-up by: Linde Gillis CMA Duncan Dull),  September 05, 2010 4:09 PM    Prescriptions: ALPRAZOLAM 1 MG TABS (ALPRAZOLAM) 1 tab by mouth three times a day as needed anxiety  #90 x 0   Entered and Authorized by:   Crawford Givens MD   Signed by:   Crawford Givens MD on 09/05/2010   Method used:   Telephoned to ...       [facility]* (retail)       [address]       [place], [place]  [address]       Ph: [phone]       Fax: [phone]   RxID:   [id number] TRAMADOL HCL 50 MG  TABS (TRAMADOL HCL) 1 tab by mouth two times a day as needed pain  #60 x 0   Entered and Authorized by:   Crawford Givens MD   Signed by:   Crawford Givens MD on 09/05/2010   Method used:   Telephoned to ...       [facility]* (retail)       [address]       [place], [place]  [address]       Ph: [phone]       Fax: [phone]   RxID:   [id number]

## 2011-01-30 NOTE — Progress Notes (Signed)
Summary: refill request for alprazolam  Phone Note Refill Request Message from:  Fax from Pharmacy  Refills Requested: Medication #1:  ALPRAZOLAM 1 MG TABS 1 tab by mouth three times a day as needed anxiety   Last Refilled: 05/07/2010 Faxed request from [facility] pharmacy, [phone].  Initial call taken by: Lowella Petties CMA,  June 06, 2010 12:45 PM  Follow-up for Phone Call        Rx called to pharmacy Follow-up by: Linde Gillis CMA Duncan Dull),  June 06, 2010 2:54 PM    Prescriptions: ALPRAZOLAM 1 MG TABS (ALPRAZOLAM) 1 tab by mouth three times a day as needed anxiety  #90 x 0   Entered and Authorized by:   Ruthe Mannan MD   Signed by:   Ruthe Mannan MD on 06/06/2010   Method used:   Telephoned to ...       [facility]* (retail)       [address]       [place], [place]  [address]       Ph: [phone]       Fax: [phone]   RxID:   [id number]

## 2011-01-30 NOTE — Miscellaneous (Signed)
Summary: Orders Update  Clinical Lists Changes  Orders: Added new Referral order of Nephrology Referral (Nephro) - Signed 

## 2011-01-30 NOTE — Progress Notes (Signed)
Summary: alprazolam & gabapentin  Phone Note Refill Request Message from:  Fax from Pharmacy on October 03, 2010 11:09 AM  Refills Requested: Medication #1:  ALPRAZOLAM 1 MG TABS 1 tab by mouth three times a day as needed anxiety   Last Refilled: 09/05/2010  Medication #2:  GABAPENTIN 300 MG TABS Take 2 tablet by mouth three times a day   Last Refilled: 09/05/2010 Refill request from [facility]. [phone]  Initial call taken by: Melody Comas,  October 03, 2010 11:10 AM  Follow-up for Phone Call        Rx's called to pharmacy Follow-up by: Linde Gillis CMA Duncan Dull),  October 03, 2010 11:19 AM    Prescriptions: ALPRAZOLAM 1 MG TABS (ALPRAZOLAM) 1 tab by mouth three times a day as needed anxiety  #90 x 0   Entered and Authorized by:   Ruthe Mannan MD   Signed by:   Ruthe Mannan MD on 10/03/2010   Method used:   Telephoned to ...       [facility]* (retail)       [address]       [place], [place]  [address]       Ph: [phone]       Fax: [phone]   RxID:   [id number] GABAPENTIN 300 MG TABS (GABAPENTIN) Take 2 tablet by mouth three times a day  #180 x 6   Entered and Authorized by:   Ruthe Mannan MD   Signed by:   Ruthe Mannan MD on 10/03/2010   Method used:   Telephoned to ...       [facility]* (retail)       [address]       [place], [place]  [address]       Ph: [phone]       Fax: [phone]   RxID:   [id number]

## 2011-01-30 NOTE — Progress Notes (Signed)
Summary: Meds  Phone Note Call from Patient Call back at Home Phone [phone]   Caller: Patient Call For: Latoya Mannan MD Summary of Call: pt states that Dr. Dayton Martes took her off xanax and put her on another med (clonazepam?)she doesn't know the name. Pt states that Dr. Dayton Martes needs to increase the med from 0.5 to 1.0? not sure what exactly pt is taking about, message was left on triage voicemail. Pt states she has alot of anxiety and panic attacks. Please advise Initial call taken by: Mervin Hack CMA Duncan Dull),  January 03, 2010 4:24 PM  Follow-up for Phone Call        left message on machine for patient to return call.  DeShannon Katrinka Blazing CMA Duncan Dull)  January 03, 2010 4:23 PM   Additional Follow-up for Phone Call Additional follow up Details #1::        Prescription in my box for her to pick up or to fax to pharmacy. Additional Follow-up by: Latoya Mannan MD,  January 04, 2010 10:22 AM    New/Updated Medications: CLONAZEPAM 0.5 MG TABS (CLONAZEPAM) 1 tab by mouth bid Prescriptions: CLONAZEPAM 0.5 MG TABS (CLONAZEPAM) 1 tab by mouth bid  #60 x 0   Entered and Authorized by:   Latoya Mannan MD   Signed by:   Latoya Mannan MD on 01/04/2010   Method used:   Print then Give to Patient   RxID:   [id number]   Appended Document: Meds Medication phoned to pharmacy.

## 2011-01-30 NOTE — Progress Notes (Signed)
Summary: written script for percocet and wants cipro  Phone Note Refill Request Call back at Home Phone [phone]   Refills Requested: Medication #1:  PERCOCET 10-325 MG  TABS 1 tab by mouth three times a day as needed pain Please call patient when ready for pick up. Patient was also treated for uti w/ cipro, she says that she has now finished the cipro, but is still having uti symptoms and feels very tired. She uses [facility].    Initial call taken by: Melody Comas,  March 01, 2010 2:31 PM  Follow-up for Phone Call        printed in put in nurse in box for pickup  needs to leave urine specimen for eval when able to check it out  Follow-up by: Judith Part MD,  March 01, 2010 4:12 PM  Additional Follow-up for Phone Call Additional follow up Details #1::        See other phone note for critical labs.Nicholes Stairs cannot drop off urine, needs to be seen.  Additional Follow-up by: Kerby Nora MD,  March 01, 2010 5:28 PM    Prescriptions: PERCOCET 10-325 MG  TABS (OXYCODONE-ACETAMINOPHEN) 1 tab by mouth three times a day as needed pain  #90 x 0   Entered and Authorized by:   Judith Part MD   Signed by:   Judith Part MD on 03/01/2010   Method used:   Print then Give to Patient   RxID:   [id number]   Appended Document: written script for percocet and wants cipro Pt seeing Dr Patsy Lager now. I gave Herbert Seta Percocet prescription to give to patient.

## 2011-01-30 NOTE — Progress Notes (Signed)
Summary: refill request for phenergan  Phone Note Refill Request Message from:  Fax from Pharmacy  Refills Requested: Medication #1:  PROMETHAZINE HCL 25 MG TABS 1/2-1 tab by mouth q 6 hrs as needed for nausea   Last Refilled: 02/07/2010 Faxed request from [facility] pharmacy, [phone].  Initial call taken by: Lowella Petties CMA,  June 08, 2010 12:59 PM    Prescriptions: PROMETHAZINE HCL 25 MG TABS (PROMETHAZINE HCL) 1/2-1 tab by mouth q 6 hrs as needed for nausea  #30 Tablet x 0   Entered and Authorized by:   Ruthe Mannan MD   Signed by:   Ruthe Mannan MD on 06/08/2010   Method used:   Electronically to        [facility]* (retail)       [address]       [place], [place]  [address]       Ph: [phone]       Fax: [phone]   RxID:   [id number]

## 2011-01-30 NOTE — Progress Notes (Signed)
Summary: refill request for percocet  Phone Note Refill Request Call back at Home Phone [phone] Message from:  Patient  Refills Requested: Medication #1:  PERCOCET 10-325 MG  TABS 1 tab by mouth three times a day as needed pain Please call when ready.  Initial call taken by: Lowella Petties CMA, AAMA,  November 01, 2010 3:30 PM    Prescriptions: PERCOCET 10-325 MG  TABS (OXYCODONE-ACETAMINOPHEN) 1 tab by mouth three times a day as needed pain  #90 x 0   Entered and Authorized by:   Ruthe Mannan MD   Signed by:   Ruthe Mannan MD on 11/02/2010   Method used:   Print then Give to Patient   RxID:   [id number]   Appended Document: refill request for percocet Patient notified as instructed via telephone, Rx ready for pick up will be left at front desk.

## 2011-01-30 NOTE — Progress Notes (Signed)
Summary: needs letter for jury duty  Phone Note Call from Patient Call back at Home Phone [phone]   Caller: Patient Call For: Ruthe Mannan MD Summary of Call: Pt got a notice for jury duty, she is supposed to go 03/22/10.  She will need a letter excusing her for medical reasons.  Please call when ready. Initial call taken by: Lowella Petties CMA,  February 08, 2010 12:21 PM  Follow-up for Phone Call        Note on my desk. Follow-up by: Ruthe Mannan MD,  February 08, 2010 1:50 PM  Additional Follow-up for Phone Call Additional follow up Details #1::        Patient Advised.  Note left at front desk. Additional Follow-up by: Delilah Shan CMA Duncan Dull),  February 08, 2010 3:19 PM

## 2011-01-30 NOTE — Progress Notes (Signed)
Summary: Aciphex  Phone Note From Pharmacy   Caller: [facility]* Call For: Dr. Dayton Martes  Summary of Call: Pharmacist at [facility] called and said that pt has never got rx there for Aciphex, but he sent it thru to see if medicaid would pay and he said it would. Please send rx for Aciphex to [facility]. Initial call taken by: Mervin Hack CMA (AAMA),  January 05, 2010 2:35 PM    New/Updated Medications: ACIPHEX 20 MG TBEC (RABEPRAZOLE SODIUM) Take 1 tab every morning Prescriptions: ACIPHEX 20 MG TBEC (RABEPRAZOLE SODIUM) Take 1 tab every morning  #90 x 3   Entered and Authorized by:   Ruthe Mannan MD   Signed by:   Ruthe Mannan MD on 01/05/2010   Method used:   Electronically to        [facility]* (retail)       [address]       [place], [place]  [address]       Ph: [phone]       Fax: [phone]   RxID:   [id number]

## 2011-01-30 NOTE — Progress Notes (Signed)
Summary: percocet  Phone Note Refill Request Call back at Home Phone [phone] Message from:  Patient on September 05, 2010 9:51 AM  Refills Requested: Medication #1:  PERCOCET 10-325 MG  TABS 1 tab by mouth three times a day as needed pain  Method Requested: Pick up at Office Initial call taken by: Melody Comas,  September 05, 2010 9:51 AM  Follow-up for Phone Call        has been filled at beginning of months #90, will refill. Follow-up by: Eustaquio Boyden  MD,  September 05, 2010 10:22 AM  Additional Follow-up for Phone Call Additional follow up Details #1::        Patient called and advised Rx up front for pick up Additional Follow-up by: Janee Morn CMA Duncan Dull),  September 05, 2010 11:21 AM    Prescriptions: PERCOCET 10-325 MG  TABS (OXYCODONE-ACETAMINOPHEN) 1 tab by mouth three times a day as needed pain  #90 x 0   Entered and Authorized by:   Eustaquio Boyden  MD   Signed by:   Eustaquio Boyden  MD on 09/05/2010   Method used:   Print then Give to Patient   RxID:   [id number]

## 2011-01-30 NOTE — Progress Notes (Signed)
Summary: vit d level and percocet  Phone Note Refill Request Call back at Home Phone [phone] Message from:  Patient on January 31, 2010 2:30 PM  Refills Requested: Medication #1:  PERCOCET 10-325 MG  TABS 1 tab by mouth three times a day as needed pain Please call patient when ready for pick up.  Patient also wants to come in and get her Vit D level checked. Please advise. She says that she has two pills left.   Method Requested: Pick up at Office Initial call taken by: Melody Comas,  January 31, 2010 2:27 PM  Follow-up for Phone Call        Percocet on my desk.  Just had Vit D checked 1 month ago, I would wait another month or two before getting it rechecked. Follow-up by: Ruthe Mannan MD,  February 01, 2010 8:37 AM  Additional Follow-up for Phone Call Additional follow up Details #1::        Okay, should she continue the Vit D supplement or DC?  I know she'll ask.n  Delilah Shan CMA (AAMA)  February 01, 2010 10:26 AM   New Problems: HAIR LOSS (ICD-704.00)   Additional Follow-up for Phone Call Additional follow up Details #2::    Yes ok to continue. Follow-up by: Ruthe Mannan MD,  February 01, 2010 10:31 AM  Additional Follow-up for Phone Call Additional follow up Details #3:: Details for Additional Follow-up Action Taken: Patient Advised.    Rx. left at front desk.  Patient says she is still very concerned about her hair coming out.  She is asking if you would refer her to a dermatologist?  Delilah Shan CMA (AAMA)  February 01, 2010 11:08 AM   New Problems: HAIR LOSS (ICD-704.00) Prescriptions: PERCOCET 10-325 MG  TABS (OXYCODONE-ACETAMINOPHEN) 1 tab by mouth three times a day as needed pain  #90 x 0   Entered and Authorized by:   Ruthe Mannan MD   Signed by:   Ruthe Mannan MD on 02/01/2010   Method used:   Print then Give to Patient   RxID:   [id number]

## 2011-01-30 NOTE — Progress Notes (Signed)
Summary: ? squirrel bites  Phone Note Call from Patient   Caller: Patient Call For: Ruthe Mannan MD Summary of Call: Pt says she has had a baby squirrel in her apartment for sometime.  She has what looks like a scratch on her foot, and maybe a bite mark on her toe.  She is concerned about this because she is a diabetic.  She wants your opinion, knows you are out today. Initial call taken by: Lowella Petties CMA,  August 24, 2010 10:44 AM  Follow-up for Phone Call        She would need to be seen if she thinks she has a significant scratch or bite.  I am only in the office for 2 hours today so please see if she can see another provider or go to weekend clinic. Thanks. Ruthe Mannan MD  August 25, 2010 5:13 AM   Additional Follow-up for Phone Call Additional follow up Details #1::        Advised pt.  She said the places are healing up, and she feels fine, so she is not going to worry about it.   Lowella Petties CMA  August 25, 2010 8:45 AM Noted. Ruthe Mannan MD  August 25, 2010 10:29 AM

## 2011-01-30 NOTE — Miscellaneous (Signed)
Summary: med list update- pen needles  Medications Added PRODIGY MINI PEN NEEDLES 31G X 5 MM MISC (INSULIN PEN NEEDLE)        Clinical Lists Changes  Medications: Added new medication of PRODIGY MINI PEN NEEDLES 31G X 5 MM MISC (INSULIN PEN NEEDLE)     Prior Medications: GABAPENTIN 300 MG TABS (GABAPENTIN) Take 2 tablet by mouth three times a day LANTUS SOLOSTAR 100 UNIT/ML SOLN (INSULIN GLARGINE) 15 units at bedtime. NOVOLOG FLEXPEN 100 UNIT/ML SOLN (INSULIN ASPART) Use as directed. SERTRALINE HCL 25 MG TABS (SERTRALINE HCL) 1 tab by mouth daily SIMVASTATIN 20 MG TABS (SIMVASTATIN) Take 1 tablet by mouth at bedtime TOPAMAX 100 MG TABS (TOPIRAMATE) 1 tab by mouth two times a day ONETOUCH ULTRASOFT LANCETS   MISC (LANCETS) Use to check blood sugar every morning and 2 hours after meals PRODIGY INSULIN SYRINGE 31G X 5/16" 0.5 ML MISC (INSULIN SYRINGE-NEEDLE U-100) use as directed up to 5 times a day NITROGLYCERIN 0.3 MG SUBL (NITROGLYCERIN) 1 tab SL by mouth q 5 min as needed chest pain.  Do not exceed 3 within 15 min ONETOUCH ULTRA TEST   STRP (GLUCOSE BLOOD) use as directed PERCOCET 10-325 MG  TABS (OXYCODONE-ACETAMINOPHEN) 1 tab by mouth three times a day as needed pain HYDROCHLOROTHIAZIDE 12.5 MG  TABS (HYDROCHLOROTHIAZIDE) Take 3  tabs  by mouth daily PROMETHAZINE HCL 25 MG TABS (PROMETHAZINE HCL) 1/2-1 tab by mouth q 6 hrs as needed for nausea POLYETHYLENE GLYCOL 3350  POWD (POLYETHYLENE GLYCOL 3350) 17 g dissolved in water daily as needed constipation PRODIGY NO CODING BLOOD GLUC  STRP (GLUCOSE BLOOD) test blood sugar 4X a day PRODIGY TWIST TOP LANCETS 28G  MISC (LANCETS) test blood sugars 4 x a day PANTOPRAZOLE SODIUM 40 MG TBEC (PANTOPRAZOLE SODIUM) 1 tab by mouth two times a day for reflux NORTRIPTYLINE HCL 10 MG CAPS (NORTRIPTYLINE HCL) Take 1 capsule by mouth at bedtime VITAMIN D (ERGOCALCIFEROL) 50000 UNIT CAPS (ERGOCALCIFEROL) 1 cap by mouth once weekly  x 8  weeks. ACIPHEX 20 MG TBEC (RABEPRAZOLE SODIUM) Take 1 tab every morning KETOCONAZOLE 2 % SHAM (KETOCONAZOLE) Use as directed. ALPRAZOLAM 1 MG TABS (ALPRAZOLAM) 1 tab by mouth three times a day as needed anxiety ASPIRIN 81 MG  TABS (ASPIRIN) Take 1 tablet by mouth once a day ZOLPIDEM TARTRATE 10 MG  TABS (ZOLPIDEM TARTRATE) 1/2 or 1 by mouth at hs prn TRAMADOL HCL 50 MG  TABS (TRAMADOL HCL) 1 tab by mouth two times a day as needed pain Current Allergies: ! * BISPHOSPHONATES ! HYDRALAZINE HCL CODEINE PHOSPHATE (CODEINE PHOSPHATE) PENICILLIN G POTASSIUM (PENICILLIN G POTASSIUM)

## 2011-01-30 NOTE — Progress Notes (Signed)
Summary: Rx Alprazolam, Tramadol, & Promethazine  Phone Note Refill Request Call back at [phone] Message from:  [facility] pharmacy on July 31, 2010 1:52 PM  Refills Requested: Medication #1:  TRAMADOL HCL 50 MG  TABS 1 tab by mouth two times a day as needed pain.   Last Refilled: 07/05/2010  Medication #2:  PROMETHAZINE HCL 25 MG TABS 1/2-1 tab by mouth q 6 hrs as needed for nausea   Last Refilled: 07/05/2010  Medication #3:  ALPRAZOLAM 1 MG TABS 1 tab by mouth three times a day as needed anxiety   Last Refilled: 07/05/2010 Received faxed refill request please advise.   Method Requested: Telephone to Pharmacy Initial call taken by: Linde Gillis CMA Duncan Dull),  July 31, 2010 1:53 PM    Prescriptions: PROMETHAZINE HCL 25 MG TABS (PROMETHAZINE HCL) 1/2-1 tab by mouth q 6 hrs as needed for nausea  #30 Tablet x 0   Entered and Authorized by:   Ruthe Mannan MD   Signed by:   Ruthe Mannan MD on 07/31/2010   Method used:   Electronically to        [facility]* (retail)       [address]       [place], [place]  [address]       Ph: [phone]       Fax: [phone]   RxID:   [id number] TRAMADOL HCL 50 MG  TABS (TRAMADOL HCL) 1 tab by mouth two times a day as needed pain  #60 x 0   Entered and Authorized by:   Ruthe Mannan MD   Signed by:   Ruthe Mannan MD on 07/31/2010   Method used:   Electronically to        [facility]* (retail)       [address]       [place], [place]  [address]       Ph: [phone]       Fax: [phone]   RxID:   [id number]

## 2011-01-30 NOTE — Progress Notes (Signed)
Summary: needs refill on percocet  Phone Note Refill Request Call back at Home Phone [phone] Message from:  Patient  Refills Requested: Medication #1:  PERCOCET 10-325 MG  TABS 1 tab by mouth three times a day as needed pain Please call pt when ready.  Initial call taken by: Lowella Petties CMA,  July 05, 2010 3:15 PM    Prescriptions: PERCOCET 10-325 MG  TABS (OXYCODONE-ACETAMINOPHEN) 1 tab by mouth three times a day as needed pain  #90 x 0   Entered and Authorized by:   Ruthe Mannan MD   Signed by:   Ruthe Mannan MD on 07/07/2010   Method used:   Print then Give to Patient   RxID:   [id number]   Appended Document: needs refill on percocet Patient advised via telephone, Rx left at front desk for pick up.

## 2011-01-30 NOTE — Miscellaneous (Signed)
Summary: Controlled Substances Contract  Controlled Substances Contract   Imported By: Maryln Gottron 07/10/2010 15:59:05  _____________________________________________________________________  External Attachment:    Type:   Image     Comment:   External Document

## 2011-01-30 NOTE — Progress Notes (Signed)
Summary: Critical lab  Phone Note From Other Clinic Call back at [phone]   Caller: [facility] Call For: Dr. Ermalene Searing Summary of Call: Potassium 6.0 and Glucose 547 drawn at 2:37PM, not hemolyzed Initial call taken by: Sydell Axon LPN,  March 01, 2010 5:15 PM  Follow-up for Phone Call        Blood sugar very high and potassium may be up due to drinking pedialyte.  Drink water instead..go back to low carb/sugar diet. Continue insulin.  Pt needs to be seen in AM tomorow.Marland Kitchenadd in please. Potassium is very high. Will send in Rx for Kayexylate on dose as soon as possible today and again in AM tommorow. If feeling very ill tonight, chest pain, palpitaion,vomiting, shortness of breath go to ER.  Also percocet refill wil be in [facility] Nurse inbox to pick up in AM Follow-up by: Kerby Nora MD,  March 01, 2010 5:33 PM  Additional Follow-up for Phone Call Additional follow up Details #1::        Patient Advised.   Appointment scheduled  03/02/2010.  Patient says medication will be delivered to her this afternoon after 6 p.m.  She understands that she is to take 1 dose today and another one in the a.m. (tomorrow). Additional Follow-up by: Delilah Shan CMA Duncan Dull),  March 01, 2010 5:57 PM    Additional Follow-up for Phone Call Additional follow up Details #2::    noted and appt this am Follow-up by: Hannah Beat MD,  March 02, 2010 8:07 AM  New/Updated Medications: KAYEXALATE  POWD (SODIUM POLYSTYRENE SULFONATE) 15 gm by mouth tonigh and repeat again in AM Prescriptions: KAYEXALATE  POWD (SODIUM POLYSTYRENE SULFONATE) 15 gm by mouth tonigh and repeat again in AM  #30 gm x 1   Entered and Authorized by:   Kerby Nora MD   Signed by:   Kerby Nora MD on 03/01/2010   Method used:   Electronically to        [facility]* (retail)       [address]       [place], [place]  [address]       Ph: [phone]       Fax: [phone]   RxID:   [id number]

## 2011-01-30 NOTE — Consult Note (Signed)
Summary: [facility]   Imported By: Lanelle Bal 08/08/2010 11:34:11  _____________________________________________________________________  External Attachment:    Type:   Image     Comment:   External Document

## 2011-01-30 NOTE — Progress Notes (Signed)
Summary: percocet  Phone Note Refill Request Call back at Home Phone [phone] Message from:  Patient on June 06, 2010 8:45 AM  Refills Requested: Medication #1:  PERCOCET 10-325 MG  TABS 1 tab by mouth three times a day as needed pain  Method Requested: Pick up at Office Initial call taken by: Melody Comas,  June 06, 2010 8:45 AM    Prescriptions: PERCOCET 10-325 MG  TABS (OXYCODONE-ACETAMINOPHEN) 1 tab by mouth three times a day as needed pain  #90 x 0   Entered and Authorized by:   Ruthe Mannan MD   Signed by:   Ruthe Mannan MD on 06/06/2010   Method used:   Print then Give to Patient   RxID:   [id number]   Appended Document: percocet Patient advised Rx ready for pick up will be left at front desk.

## 2011-01-30 NOTE — Progress Notes (Signed)
Summary: clonazepam  Phone Note Call from Patient Call back at Home Phone [phone]   Caller: Patient Call For: Latoya Mannan MD Summary of Call: Patient said that she feels like she needs a stronger dose of the Clonozepam. She says that she feels very nervous. Would like phone call back.  Initial call taken by: Melody Comas,  February 02, 2010 12:05 PM  Follow-up for Phone Call        I would prefer for her to take the medication at current dosage three times daily instead of two times daily before we increase the dosage. Follow-up by: Latoya Mannan MD,  February 02, 2010 12:06 PM  Additional Follow-up for Phone Call Additional follow up Details #1::        Patient Advised.  Additional Follow-up by: Delilah Shan CMA Duncan Dull),  February 02, 2010 12:38 PM

## 2011-01-30 NOTE — Miscellaneous (Signed)
Summary: med list update- syringes, refill syringes  Medications Added PRODIGY INSULIN SYRINGE 31G X 5/16" 0.5 ML MISC (INSULIN SYRINGE-NEEDLE U-100) use as directed up to 5 times a day       Clinical Lists Changes  Medications: Changed medication from BD INSULIN SYRINGE ULTRAFINE 29G X 1/2" 0.5 ML  MISC (INSULIN SYRINGE-NEEDLE U-100) Use as directed up to 5 injections daily Dispense 2 boxes to PRODIGY INSULIN SYRINGE 31G X 5/16" 0.5 ML MISC (INSULIN SYRINGE-NEEDLE U-100) use as directed up to 5 times a day - Signed Rx of PRODIGY INSULIN SYRINGE 31G X 5/16" 0.5 ML MISC (INSULIN SYRINGE-NEEDLE U-100) use as directed up to 5 times a day;  #200 x prn;  Signed;  Entered by: Lowella Petties CMA;  Authorized by: Ruthe Mannan MD;  Method used: Telephoned to [facility] [address] [place], [place]  [address], Ph: [phone], Fax: [phone]    Prescriptions: PRODIGY INSULIN SYRINGE 31G X 5/16" 0.5 ML MISC (INSULIN SYRINGE-NEEDLE U-100) use as directed up to 5 times a day  #200 x prn   Entered by:   Lowella Petties CMA   Authorized by:   Ruthe Mannan MD   Signed by:   Lowella Petties CMA on 06/07/2010   Method used:   Telephoned to ...       [facility]* (retail)       [address]       [place], [place]  [address]       Ph: [phone]       Fax: [phone]   RxID:   [id number]   Prior Medications: GABAPENTIN 300 MG TABS (GABAPENTIN) Take 2 tablet by mouth three times a day LANTUS 100 UNIT/ML SOLN (INSULIN GLARGINE) Inject 15 unit subcutaneously every am NOVOLOG 100 UNIT/ML SOLN (INSULIN ASPART) use sliding scale with meals SERTRALINE HCL 25 MG TABS (SERTRALINE HCL) 1 tab by mouth daily SIMVASTATIN 20 MG TABS (SIMVASTATIN) Take 1 tablet by mouth at bedtime TOPAMAX 100 MG TABS (TOPIRAMATE) 1 tab by mouth two times a day ONETOUCH ULTRASOFT LANCETS   MISC (LANCETS) Use to check blood sugar every morning and 2 hours after meals PRODIGY INSULIN SYRINGE 31G X 5/16" 0.5 ML  MISC (INSULIN SYRINGE-NEEDLE U-100) use as directed up to 5 times a day NITROGLYCERIN 0.3 MG SUBL (NITROGLYCERIN) 1 tab SL by mouth q 5 min as needed chest pain.  Do not exceed 3 within 15 min ONETOUCH ULTRA TEST   STRP (GLUCOSE BLOOD) use as directed PERCOCET 10-325 MG  TABS (OXYCODONE-ACETAMINOPHEN) 1 tab by mouth three times a day as needed pain HYDROCHLOROTHIAZIDE 12.5 MG  TABS (HYDROCHLOROTHIAZIDE) Take 3  tabs  by mouth daily PROMETHAZINE HCL 25 MG TABS (PROMETHAZINE HCL) 1/2-1 tab by mouth q 6 hrs as needed for nausea POLYETHYLENE GLYCOL 3350  POWD (POLYETHYLENE GLYCOL 3350) 17 g dissolved in water daily as needed constipation PRODIGY NO CODING BLOOD GLUC  STRP (GLUCOSE BLOOD) test blood sugar 4X a day PRODIGY TWIST TOP LANCETS 28G  MISC (LANCETS) test blood sugars 4 x a day PANTOPRAZOLE SODIUM 40 MG TBEC (PANTOPRAZOLE SODIUM) 1 tab by mouth two times a day for reflux NORTRIPTYLINE HCL 10 MG CAPS (NORTRIPTYLINE HCL) Take 1 capsule by mouth at bedtime VITAMIN D (ERGOCALCIFEROL) 50000 UNIT CAPS (ERGOCALCIFEROL) 1 cap by mouth once weekly  x 8 weeks. ACIPHEX 20 MG TBEC (RABEPRAZOLE SODIUM) Take 1 tab every morning KETOCONAZOLE 2 % SHAM (KETOCONAZOLE) Use as directed. ALPRAZOLAM 1 MG TABS (ALPRAZOLAM) 1 tab by mouth three times a  day as needed anxiety ASPIRIN 81 MG  TABS (ASPIRIN) Take 1 tablet by mouth once a day ZOLPIDEM TARTRATE 10 MG  TABS (ZOLPIDEM TARTRATE) 1/2 or 1 by mouth at hs prn Current Allergies: ! * BISPHOSPHONATES ! HYDRALAZINE HCL CODEINE PHOSPHATE (CODEINE PHOSPHATE) PENICILLIN G POTASSIUM (PENICILLIN G POTASSIUM)

## 2011-01-30 NOTE — Progress Notes (Signed)
Summary: refill request for alprazolam  Phone Note Refill Request Message from:  Fax from Pharmacy  Refills Requested: Medication #1:  ALPRAZOLAM 1 MG TABS 1 tab by mouth three times a day as needed anxiety   Last Refilled: 04/12/2010 Faxed request from [place] pharmacy, [phone].  Initial call taken by: Lowella Petties CMA,  May 17, 2010 10:13 AM  Follow-up for Phone Call        Rx called to pharmacy Follow-up by: Linde Gillis CMA Duncan Dull),  May 17, 2010 10:20 AM    Prescriptions: ALPRAZOLAM 1 MG TABS (ALPRAZOLAM) 1 tab by mouth three times a day as needed anxiety  #90 x 0   Entered and Authorized by:   Ruthe Mannan MD   Signed by:   Ruthe Mannan MD on 05/17/2010   Method used:   Telephoned to ...       [facility]* (retail)       [address]       [place], [place]  [address]       Ph: [phone]       Fax: [phone]   RxID:   [id number]

## 2011-01-30 NOTE — Progress Notes (Signed)
Summary: potassium critical  Phone Note From Other Clinic   Caller: [facility] Call For: [name] Summary of Call: Called from lab Potassium 6.0 Initial call taken by: Benny Lennert CMA Duncan Dull),  March 14, 2010 2:52 PM  Follow-up for Phone Call        Appears to be chronic issue for her, but higher than 6 is a risk for cardiac arrythmia. Give kayexylate 15 gm x 1 today then repeat tomaorow x1. Return for BMET on Thursady.  Dx 276.7 Will forward results to Dr. Dayton [name] for her review and other recommendations. Consiuder decreasing/holding  lisinopril Follow-up by: Kerby Nora MD,  March 14, 2010 4:08 PM  Additional Follow-up for Phone Call Additional follow up Details #1::        Phone call completed Additional Follow-up by: Benny Lennert CMA Duncan Dull),  March 14, 2010 4:23 PM    New/Updated Medications: KAYEXALATE  POWD (SODIUM POLYSTYRENE SULFONATE) 15gm by mouth x 1 then repeat tommorow. Will cause bowel movement. Prescriptions: KAYEXALATE  POWD (SODIUM POLYSTYRENE SULFONATE) 15gm by mouth x 1 then repeat tommorow. Will cause bowel movement.  #30 gm x 0   Entered and Authorized by:   Kerby Nora MD   Signed by:   Kerby Nora MD on 03/14/2010   Method used:   Electronically to        [facility]* (retail)       [address]       [place], [place]  [address]       Ph: [phone]       Fax: [phone]   RxID:   [id number]

## 2011-01-31 ENCOUNTER — Telehealth: Payer: Self-pay | Admitting: Family Medicine

## 2011-02-01 NOTE — Progress Notes (Signed)
Summary: needs letter/ can't use flexpin  Phone Note Call from Patient Call back at Home Phone [phone]   Caller: Patient Call For: Latoya Mannan MD Summary of Call: Patient says that you will need to send a letter to [employer] explaining to them that you corrected the form, or they will think that she did the correction her self. She also says that she is not going to be able to use the flexpen because of the trouble that she has with her right hand she has a hard time grasping on to it and wants to know what you recommend.   Initial call taken by: Melody Comas,  January 16, 2011 12:28 PM  Follow-up for Phone Call        Does she want syringes? Latoya Mannan MD  January 16, 2011 12:33 PM  Called patient x 2 and got a busy signal both times.  Linde Gillis CMA Duncan Dull)  January 16, 2011 3:21 PM   Called patient x 2 and got a busy signal again.  Will call again later.  Linde Gillis CMA Duncan Dull)  January 17, 2011 9:08 AM   Patient would like to go back on Novolog which she was on before she went to the flexpen.  Please advise.  Uses [employer]. Follow-up by: Linde Gillis CMA Duncan Dull),  January 17, 2011 10:20 AM  Additional Follow-up for Phone Call Additional follow up Details #1::        Rx sent to pharmacy. Patient notified via telephone. Additional Follow-up by: Linde Gillis CMA Duncan Dull),  January 17, 2011 10:38 AM    New/Updated Medications: NOVOLOG 100 UNIT/ML SOLN (INSULIN ASPART) 4-14 units qid (sliding scale) depending on blood sugar Prescriptions: NOVOLOG 100 UNIT/ML SOLN (INSULIN ASPART) 4-14 units qid (sliding scale) depending on blood sugar  #1 x 3   Entered and Authorized by:   Latoya Mannan MD   Signed by:   Latoya Mannan MD on 01/17/2011   Method used:   Electronically to        [facility]* (retail)       [address]       [place], [place]  [address]       Ph: [phone]       Fax: [phone]   RxID:   [id number]

## 2011-02-01 NOTE — Progress Notes (Signed)
Summary: alprazolam   Phone Note Refill Request Message from:  Fax from Pharmacy on January 15, 2011 10:31 AM  Refills Requested: Medication #1:  ALPRAZOLAM 1 MG TABS 1 tab by mouth three times a day as needed anxiety   Last Refilled: 12/11/2010 Refill request from [place] pharmacy.  [phone].  Initial call taken by: Melody Comas,  January 15, 2011 10:32 AM  Follow-up for Phone Call        Rx called to pharmacy.  Follow-up by: Melody Comas,  January 15, 2011 2:58 PM    Prescriptions: ALPRAZOLAM 1 MG TABS (ALPRAZOLAM) 1 tab by mouth three times a day as needed anxiety  #90 x 0   Entered and Authorized by:   Ruthe Mannan MD   Signed by:   Ruthe Mannan MD on 01/15/2011   Method used:   Telephoned to ...       [facility]* (retail)       [address]       [place], [place]  [address]       Ph: [phone]       Fax: [phone]   RxID:   [id number]

## 2011-02-01 NOTE — Progress Notes (Signed)
Summary: sister wants note re-written  Phone Note Call from Patient Call back at Home Phone [phone]   Caller: sister, Tomasa Rand Summary of Call: Pt picked up the note that was written today stating that pt sometimes needs help with her meds.  She didnt like the note, she wants the note to mention that the pt has multiple medical problems.  She doesnt like the part that states that pt needs help with her meds, she says that makes her sound incompetent.  She is asking that you re-write the note. Initial call taken by: Lowella Petties CMA, AAMA,  December 29, 2010 4:47 PM  Follow-up for Phone Call        Note re written. Ruthe Mannan MD  January 02, 2011 10:37 AM  Patient notified via telephone, note left at front desk. Follow-up by: Linde Gillis CMA Duncan Dull),  January 02, 2011 10:44 AM

## 2011-02-01 NOTE — Progress Notes (Signed)
Summary: Rx Alprazolam  Phone Note Refill Request Call back at [phone] Message from:  [facility] on December 11, 2010 11:28 AM  Refills Requested: Medication #1:  ALPRAZOLAM 1 MG TABS 1 tab by mouth three times a day as needed anxiety   Last Refilled: 11/02/2010  Method Requested: Telephone to Pharmacy Initial call taken by: Sydell Axon LPN,  December 11, 2010 11:28 AM  Follow-up for Phone Call        Rx called to pharmacy Follow-up by: Sydell Axon LPN,  December 11, 2010 12:04 PM    Prescriptions: ALPRAZOLAM 1 MG TABS (ALPRAZOLAM) 1 tab by mouth three times a day as needed anxiety  #90 x 0   Entered and Authorized by:   Ruthe Mannan MD   Signed by:   Ruthe Mannan MD on 12/11/2010   Method used:   Telephoned to ...       [facility]* (retail)       [address]       [place], [place]  [address]       Ph: [phone]       Fax: [phone]   RxID:   [id number]

## 2011-02-01 NOTE — Progress Notes (Signed)
Summary: VITAMIN D  Phone Note Refill Request Message from:  Macario Golds [phone] on January 02, 2011 12:22 PM  Refills Requested: Medication #1:  VITAMIN D (ERGOCALCIFEROL) 50000 UNIT CAPS 1 cap by mouth once weekly  x 8 weeks.   Last Refilled: 11/21/2010 ok to refill? sig states 1 by mouth once a week for 8 weeks?   Method Requested: Electronic Initial call taken by: Mervin Hack CMA Duncan Dull),  January 02, 2011 12:22 PM  Follow-up for Phone Call        no needs to recheck Vit D level before we refill- ICD 268. Ruthe Mannan MD  January 02, 2011 12:33 PM  No answer or machine will call back later.  Linde Gillis CMA Duncan Dull)  January 02, 2011 2:06 PM   Patient advised as instructed via telephone.  She will call to schedule appt for lab visit.  Follow-up by: Linde Gillis CMA Duncan Dull),  January 03, 2011 8:57 AM

## 2011-02-01 NOTE — Letter (Signed)
Summary: ORDER OF CLOSURE FOR APPEAL/Administrative Hearings  ORDER OF CLOSURE FOR APPEAL/Administrative Hearings   Imported By: Lanelle Bal 12/15/2010 09:40:02  _____________________________________________________________________  External Attachment:    Type:   Image     Comment:   External Document

## 2011-02-01 NOTE — Letter (Signed)
Summary: Generic Letter  [place] at [facility]  [address] [place], [place] [address]   Phone: [phone]  Fax: [phone]    01/02/2011  TO whom it may concern, Please allow Ms. [name] to have family or friends stay with her when she is not feeling well.  She has multiple medical problems, including  brittle diabetes, and often needs assistance with her medications.       Sincerely,   Ruthe Mannan MD

## 2011-02-01 NOTE — Progress Notes (Signed)
Summary: wants refills on alprazolam  Phone Note Call from Patient   Caller: Patient Call For: Ruthe Mannan MD Summary of Call: Pt is asking that refills be added to her alprazolam refills.  She said that would be more convenient for her, she sometimes goes without the medicine for a few days if the pharmacy doesnt get the refill from [place] in time.  She says that you know that she does not abuse this.  She is requesting that refills be added to the script that was sent to [facility] pharmacy yesterday. Initial call taken by: Lowella Petties CMA, AAMA,  January 16, 2011 12:05 PM  Follow-up for Phone Call        ok to add one refill. Ruthe Mannan MD  January 16, 2011 12:07 PM   Called pharmacy, added 1 refill to alprazolam.  Follow-up by: Melody Comas,  January 16, 2011 12:21 PM

## 2011-02-01 NOTE — Progress Notes (Signed)
Summary: Accomodation form  Phone Note Call from Patient Call back at Home Phone [phone]   Caller: Patient Call For: Ruthe Mannan MD Summary of Call: Patient called to let us know that someone from [facility] [employer] called her and said that they spoke to someone from our office regarding her health/medical conditions.  I advised Ms. Mifsud that no one from our office has spoken to them.  The only communication we have had with [employer] is via fax.  Dr. Dayton Martes filled out a form for Ms. Welz on 01/08/11 and it was faxed back to [employer] and Dr. Dayton Martes wrote a letter and the patient picked it up on 01/02/2011 but we have not spoken to anyone over the telephone.  Advised patient that if they had any question they need to call us. Initial call taken by: Linde Gillis CMA Duncan Dull),  January 12, 2011 3:56 PM  Follow-up for Phone Call        agreed.  thank you. Ruthe Mannan MD  January 12, 2011 4:01 PM

## 2011-02-01 NOTE — Progress Notes (Addendum)
Summary: needs note for assistance  Phone Note Call from Patient Call back at Home Phone [phone]   Caller: Patient Call For: Ruthe Mannan MD Summary of Call: Pt states she took the wrong insulin yesterday and is not feeling well today.  She says she needs somebody to stay with her sometimes to help her, but her apartment complex wont let anyone stay with the residents unless thay have a note from their doctors saying that it is sometimes necessary.  Please call when ready and she will have it picked up.  She says she needs her sister to help her keep her meds straight and take care of her when she is feeling bad. Initial call taken by: Lowella Petties CMA, AAMA,  December 29, 2010 12:51 PM  Follow-up for Phone Call        In my box. Ruthe Mannan MD  December 29, 2010 1:01 PM  Advised pt that note is ready for pick up. Follow-up by: Lowella Petties CMA, AAMA,  December 29, 2010 1:05 PM

## 2011-02-01 NOTE — Letter (Signed)
Summary: Generic Letter  [place] at [facility]  [address] [place], [place] [address]   Phone: [phone]  Fax: [phone]    12/29/2010  TO whom it may concern, Please allow Ms. [name] to have family or friends stay with her when she is not feeling well.  She is a brittle diabetic and often needs assistance with her medications.    Sincerely,   Ruthe Mannan MD

## 2011-02-01 NOTE — Progress Notes (Signed)
Summary: Reasonable Accommodation Modification form  Phone Note Other Incoming   Caller: [employer] Summary of Call: Received faxed request form requesting information regarding reasonable accommodation.  Form in your IN box. Initial call taken by: Linde Gillis CMA Duncan Dull),  January 08, 2011 10:55 AM  Follow-up for Phone Call        form filled out and returned to [place]. Ruthe Mannan MD  January 08, 2011 10:59 AM  Form faxed to [phone]. Follow-up by: Linde Gillis CMA (AAMA),  January 08, 2011 11:00 AM

## 2011-02-01 NOTE — Progress Notes (Signed)
Summary: refill request for percocet & tramadol  Phone Note Refill Request Call back at Home Phone [phone] Message from:  Patient  Refills Requested: Medication #1:  PERCOCET 10-325 MG  TABS 1 tab by mouth three times a day as needed pain Please call when ready.  Initial call taken by: Lowella Petties CMA, AAMA,  January 02, 2011 9:22 AM  Follow-up for Phone Call        Patient advised, Rx's ready or pick up will be left at front desk. Follow-up by: Linde Gillis CMA Duncan Dull),  January 02, 2011 11:15 AM    Prescriptions: PERCOCET 10-325 MG  TABS (OXYCODONE-ACETAMINOPHEN) 1 tab by mouth three times a day as needed pain  #90 x 0   Entered by:   Linde Gillis CMA (AAMA)   Authorized by:   Ruthe Mannan MD   Signed by:   Linde Gillis CMA (AAMA) on 01/02/2011   Method used:   Print then Give to Patient   RxID:   [id number] TRAMADOL HCL 50 MG  TABS (TRAMADOL HCL) 1 tab by mouth two times a day as needed pain  #60 x 0   Entered by:   Linde Gillis CMA (AAMA)   Authorized by:   Ruthe Mannan MD   Signed by:   Linde Gillis CMA (AAMA) on 01/02/2011   Method used:   Print then Give to Patient   RxID:   [id number] PERCOCET 10-325 MG  TABS (OXYCODONE-ACETAMINOPHEN) 1 tab by mouth three times a day as needed pain  #90 x 0   Entered and Authorized by:   Ruthe Mannan MD   Signed by:   Ruthe Mannan MD on 01/02/2011   Method used:   Telephoned to ...       [facility]* (retail)       [address]       [place], [place]  [address]       Ph: [phone]       Fax: [phone]   RxID:   [id number]

## 2011-02-02 ENCOUNTER — Telehealth: Payer: Self-pay | Admitting: Family Medicine

## 2011-02-05 ENCOUNTER — Encounter: Payer: Self-pay | Admitting: Family Medicine

## 2011-02-07 ENCOUNTER — Telehealth (INDEPENDENT_AMBULATORY_CARE_PROVIDER_SITE_OTHER): Payer: Self-pay | Admitting: *Deleted

## 2011-02-07 NOTE — Progress Notes (Signed)
Summary: cracked rib   Phone Note Call from Patient Call back at Home Phone [phone]   Caller: Patient Call For: Ruthe Mannan MD Summary of Call: Patient came in to pick up her rxs. While she was here she said that she had cracked a rib when she feel and hit her toilet 2 weeks ago. She wanted to be seen by you today. I advised her to go to ER ,she refused to go, said that she would just wait until next week to see you. She also wanted her vit. d checked today, I explained to her that I woiuld have to have an order bofore scheduling a lab appt. She then go very upset and said that she didn't want anything from [place]. I again offered her an appt to see you next week and she refused.  Initial call taken by: Melody Comas,  January 31, 2011 2:23 PM  Follow-up for Phone Call        I"m sorry, Morrie Sheldon.  You handled it appropriately though. Ruthe Mannan MD  January 31, 2011 2:28 PM   Follow-up by: Melody Comas,  January 31, 2011 2:31 PM

## 2011-02-07 NOTE — Progress Notes (Signed)
Summary: refill requests for tramadol, phenergan, percocet  Phone Note Refill Request Call back at Home Phone [phone] Message from:  Patient  Refills Requested: Medication #1:  PERCOCET 10-325 MG  TABS 1 tab by mouth three times a day as needed pain  Medication #2:  TRAMADOL HCL 50 MG  TABS 1 tab by mouth two times a day as needed pain  Medication #3:  PROMETHAZINE HCL 25 MG TABS 1/2-1 tab by mouth q 6 hrs as needed for nausea Please call pt when ready.  Pt requests written script for tramadol as well.  Promethazine can be sent to [facility] pharmacy.  Initial call taken by: Lowella Petties CMA, AAMA,  January 31, 2011 11:35 AM  Follow-up for Phone Call        Patient notified Rx ready for pick up, she request that we give her a written Rx for Tramadol as well as Percocet.  Rx's will be left at front desk for pick up. Follow-up by: Linde Gillis CMA Duncan Dull),  January 31, 2011 11:49 AM    Prescriptions: TRAMADOL HCL 50 MG  TABS (TRAMADOL HCL) 1 tab by mouth two times a day as needed pain  #60 x 0   Entered by:   Linde Gillis CMA (AAMA)   Authorized by:   Ruthe Mannan MD   Signed by:   Linde Gillis CMA (AAMA) on 01/31/2011   Method used:   Print then Give to Patient   RxID:   [id number] PERCOCET 10-325 MG  TABS (OXYCODONE-ACETAMINOPHEN) 1 tab by mouth three times a day as needed pain  #90 x 0   Entered and Authorized by:   Ruthe Mannan MD   Signed by:   Ruthe Mannan MD on 01/31/2011   Method used:   Print then Give to Patient   RxID:   [id number] TRAMADOL HCL 50 MG  TABS (TRAMADOL HCL) 1 tab by mouth two times a day as needed pain  #60 x 0   Entered and Authorized by:   Ruthe Mannan MD   Signed by:   Ruthe Mannan MD on 01/31/2011   Method used:   Electronically to        [facility]* (retail)       [address]       [place], [place]  [address]       Ph: [phone]       Fax: [phone]   RxID:   [id number] PROMETHAZINE HCL 25 MG TABS (PROMETHAZINE HCL)  1/2-1 tab by mouth q 6 hrs as needed for nausea  #30 Tablet x 0   Entered and Authorized by:   Ruthe Mannan MD   Signed by:   Ruthe Mannan MD on 01/31/2011   Method used:   Electronically to        [facility]* (retail)       [address]       [place], [place]  [address]       Ph: [phone]       Fax: [phone]   RxID:   [id number]

## 2011-02-08 ENCOUNTER — Telehealth: Payer: Self-pay | Admitting: Family Medicine

## 2011-02-14 ENCOUNTER — Other Ambulatory Visit: Payer: Self-pay | Admitting: Oncology

## 2011-02-14 ENCOUNTER — Encounter: Payer: Self-pay | Admitting: Family Medicine

## 2011-02-14 ENCOUNTER — Encounter (HOSPITAL_BASED_OUTPATIENT_CLINIC_OR_DEPARTMENT_OTHER): Payer: Medicaid Other | Admitting: Oncology

## 2011-02-14 DIAGNOSIS — D472 Monoclonal gammopathy: Secondary | ICD-10-CM

## 2011-02-14 DIAGNOSIS — D649 Anemia, unspecified: Secondary | ICD-10-CM

## 2011-02-14 DIAGNOSIS — D638 Anemia in other chronic diseases classified elsewhere: Secondary | ICD-10-CM

## 2011-02-14 DIAGNOSIS — N289 Disorder of kidney and ureter, unspecified: Secondary | ICD-10-CM

## 2011-02-14 LAB — CBC WITH DIFFERENTIAL/PLATELET
Basophils Absolute: 0 10*3/uL (ref 0.0–0.1)
Eosinophils Absolute: 0.1 10*3/uL (ref 0.0–0.5)
HCT: 35.4 % (ref 34.8–46.6)
LYMPH%: 25.8 % (ref 14.0–49.7)
MONO#: 0.2 10*3/uL (ref 0.1–0.9)
NEUT#: 3.1 10*3/uL (ref 1.5–6.5)
NEUT%: 67 % (ref 38.4–76.8)
Platelets: 144 10*3/uL — ABNORMAL LOW (ref 145–400)
WBC: 4.7 10*3/uL (ref 3.9–10.3)

## 2011-02-14 LAB — URINALYSIS, MICROSCOPIC - CHCC
Ketones: NEGATIVE mg/dL
Protein: NEGATIVE mg/dL
Specific Gravity, Urine: 1.02 (ref 1.003–1.035)
pH: 6 (ref 4.6–8.0)

## 2011-02-15 ENCOUNTER — Telehealth (INDEPENDENT_AMBULATORY_CARE_PROVIDER_SITE_OTHER): Payer: Self-pay | Admitting: *Deleted

## 2011-02-15 NOTE — Progress Notes (Signed)
Summary: Patient Questions  Phone Note Other Incoming   Caller: Patient Details for Reason: Discuss paperwork Summary of Call: Patient presented to the office to discuss concerns.  Pt and sister were in office to discuss paperwork.  Had questions for Dr. Dayton Martes.  Patient can be called and said that patient's sister who was in office today, Okey Regal, could also be called for follow up.  Carol's # is [phone].  Pt states sister, Okey Regal, and Orrin Brigham, are on designated party release form.  Please verify on designated party release/HIPPA forms and follow-up with patient.   Initial call taken by: Clarisa Schools,  February 02, 2011 5:14 PM  Follow-up for Phone Call        Called pt back.  She was told that in the letter that I wrote for her, I called her "retarded."  I explained to her that I wrote no such thing.  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  Her sister than told us we needed to rewrite it so we did.  Then we received an official form that we signed stating the same information, that she has brittle diabetes and mental impairment because they told me she is impaired and can't think when her sugars are too high or low and she is illiterate which means it is even more dangerous.  Ms. Sabey understood.   She asked that I called sister, Okey Regal as well.  I attempted to call 3 timres.  There was no answer and her voicemail is not set up.  Ruthe Mannan MD  February 05, 2011 7:59 AM   Additional Follow-up for Phone Call Additional follow up Details #1::        I called Ms. Decola back because I could not get a hold of her sister.  she asked that I resend the form and rewrite the letter stating only that she has DJD, diabetes, arthritis and unstable blood pressure. Letter rewritten, form modified and sent. Ruthe Mannan MD  February 05, 2011 8:32 AM  Faxed a revised copy of the form and a new letter to Partnership, also will mail a copy to Ms.  Shrake.  Linde Gillis CMA Duncan Dull)  February 05, 2011 8:40 AM

## 2011-02-15 NOTE — Letter (Signed)
Summary: Generic Letter  [place] at [facility]  [address] [place], [place] [address]   Phone: [phone]  Fax: [phone]    02/05/2011  To whom it may concern:  Please allow Ms. Shanahan to have family stay with her in her apartment.  She has brittle diabetes, severe arthritis, degenerative joint disease and multiple other chronic medical conditions.  She is mentally competant but like with all brittle diabetes can become confused when her sugars are too high or too low.        Sincerely,   Ruthe Mannan MD

## 2011-02-15 NOTE — Progress Notes (Signed)
Summary: [name]  Phone Note Refill Request Message from:  Fax from Pharmacy on February 07, 2011 4:09 PM  Refills Requested: Medication #1:  ZOLPIDEM TARTRATE 10 MG  TABS 1/2 or 1 by mouth at hs prn   Last Refilled: 01/23/2011 Refill request from [facility] pharmacy. [phone].  Initial call taken by: Melody Comas,  February 07, 2011 4:10 PM  Follow-up for Phone Call        i will forward to Dr. Dayton Martes who is here in the morning per our normal routine. Spencer Copland MD  February 07, 2011 5:04 PM   out of town, will refill x 1. Hannah Beat MD  February 07, 2011 5:04 PM   Additional Follow-up for Phone Call Additional follow up Details #1::        Rx called to pharmacy Additional Follow-up by: Benny Lennert CMA Duncan Dull),  February 07, 2011 6:45 PM    Prescriptions: ZOLPIDEM TARTRATE 10 MG  TABS (ZOLPIDEM TARTRATE) 1/2 or 1 by mouth at hs prn  #30 x 0   Entered and Authorized by:   Hannah Beat MD   Signed by:   Hannah Beat MD on 02/07/2011   Method used:   Telephoned to ...       [facility]* (retail)       [address]       [place], [place]  [address]       Ph: [phone]       Fax: [phone]   RxID:   [id number]

## 2011-02-15 NOTE — Progress Notes (Addendum)
Summary: Patient request phone call  Phone Note Other Incoming   Caller: [name] and [name] Details for Reason: [name] In to discuss concerns  Summary of Call: Presented in office to discuss privacy.  I was unavailable and they left a message for someone to call back today or they would contact a lawyer.  I returned calls to both the telephone numbers left as contact numbers.  I called three times and left three voicemails asking for a return call.    Dr. Dayton Martes is out of the office.  I will forward this to her nurse and continue to call patient's contact numbers.   I will request that the nurse also call and discuss concerns about privacy and the information dated regarding patient's requests for information.  Again, confirming med release, designated party, and HIPPA prior to releasing information.  Thanks Initial call taken by: Clarisa Schools,  February 08, 2011 3:12 PM     Appended Document: Patient request phone call Met with patient and her sister with patient's consent.  Patient and her sister, [name] stated that this started when she requested information for her apartment.  She furthermore stated that she had held subsequent conversations requesting the information.  One concern appeared to be that she did not agree with the statements on the forms and the diagnosis.  To clarify exactly how the patient would like correspondence for her health information, a Request for Restrictions/Disclosure of Health Information was completed and signed by the patient and the sister that specifically states no information to be shared with Partnership Property [employer] or anyone other than designated parties on [employer] Form and that if any information is requested the patient will pick up the information.  Do not send information to the management group or anyone associated with the group.  Please see the form attached in documents for details and additional names who  are restricted from patient information.  I witnessed the signing of the form as well as the office lead, Daine Gip.    The patient requested changing physicians and said she would call back to set up her next appointment.  She was provided information on other physicians and a card to call back and schedule.    The patient and her sister, [name], agreed to proceeding in the way described and were appreciative for [place] working with them.   Appended Document: Patient request phone call I apologize that she is so upset.  I was filling out forms and writing letters in a way that I thought the patient wanted me to do.  She asked to write a letter stating that she needed someone to stay with her because of her medical problems to help administer medications.  Then we were told a form would be sent which we signed as well.  [name] told me to include several of her medical problems including confusion when her sugars are high/low, arthritis, degernative joint disease.  I called pt to discuss this and I thought we were doing what she requested.  Once again, I apologize that she is upset and we did not fill out the forms they way she wanted.  We also did believe that she knew a form was sent from the property group.

## 2011-02-22 ENCOUNTER — Telehealth (INDEPENDENT_AMBULATORY_CARE_PROVIDER_SITE_OTHER): Payer: Self-pay | Admitting: *Deleted

## 2011-02-27 ENCOUNTER — Telehealth: Payer: Self-pay | Admitting: Family Medicine

## 2011-02-27 NOTE — Progress Notes (Signed)
Summary: Pt request Aricept  Phone Note Call from Patient Call back at [phone]   Caller: Patient Summary of Call: Pt concerned that she forgets where she puts things. Pt stated pharmacist at [facility] told pt that some of the meds pt has been taking for a while(Percocet and Xanax) could cause her to forget things and recommended getting Aricept to help her remember things and to think better. Pt said since Aricept is not supposed to have side effects or problems pt does not want to come in to be seen for this. Pt was last seen by Dr Dayton Martes 06/2010. Offered an appt with Dr Dayton Martes to discuss Aricept and pt said she had a meeting with Jamesetta So & Aram Beecham and had changed  doctors to Dr Para March. Pt asked me to ask Dr Para March if he would prescribe Aricept without seeing pt. Pt uses [facility] and pt can be reached at [phone].Please advise.Lewanda Rife LPN  February 15, 2011 1:34 PM   Follow-up for Phone Call        needs OV.  appointment.   There will need to be a doctor to doctor discussion concerning this patient prior to accepting this patient. Follow-up by: Crawford Givens MD,  February 15, 2011 1:45 PM  Additional Follow-up for Phone Call Additional follow up Details #1::        I have d/w Dr. Dayton Martes.  Dr. Dayton Martes is willing to see the patient.  Pt needs to decide if she is going to transfer to me for primary care.  If so, then she needs a appointment.  No meds in the meantime rx'd by me.  I will defer to management to address the transfer issue.  Crawford Givens MD  February 15, 2011 3:08 PM     Additional Follow-up for Phone Call Additional follow up Details #2::    Patient called today and received her [place] card and [place] has assigned her to a physician outside of [place].  Explained to patient that [facility] physicians and that is not our decision and advised she call Medicaid if she had questions about the physician  assigned to her and advised patient to set up appointment with the physician Medicaid assigned.  Patient had question about refills and would like to continue to receive refills until she has seen new doctor.   Follow-up by: Clarisa Schools,  February 20, 2011 7:54 PM  Additional Follow-up for Phone Call Additional follow up Details #3:: Details for Additional Follow-up Action Taken: ok to continue refills. Ruthe Mannan MD  February 21, 2011 7:30 AM  I will defer to Dr. Dayton Martes. Crawford Givens MD  February 21, 2011 1:31 PM

## 2011-02-27 NOTE — Progress Notes (Signed)
Summary: Sister's call  Phone Note Call from Patient   Reason for Call: Insurance Question Details for Reason: Pt's sister called re: change on Medicaid Card and doctor assigned Summary of Call: Pt's sister called re: [name]'s doctor changing on [employer].  Explained that Medicaid was assigning physicians to their patients and that we were not her assigned office.  It was further explained that assignment was from [facility] and that there are many other patients being assigned different doctor's offices at this time.    [name]'s sister questioned why we saw her in the past with Medicaid and again it was explained that Medicaid assigned the physicians and were converting patients to assigned doctors.   Initial call taken by: [name],  February 22, 2011 11:17 AM  Follow-up for Phone Call        Noted. Ruthe Mannan MD  February 22, 2011 11:20 AM     Additional Follow-up for Phone Call Additional follow up Details #2::    Notified exec management of conversation with patient describing Medicaid [place] Access program change that assigned different doctors who are [facility] [place] participants to the patient.  Further explaining, the Medicaid letter and reassignment was mailed from [facility] to patient and is not initiatied or associated with  practice.  Follow-up by: [name],  February 23, 2011 9:20 AM

## 2011-03-01 ENCOUNTER — Telehealth: Payer: Self-pay | Admitting: Family Medicine

## 2011-03-08 NOTE — Letter (Signed)
Summary: [facility]  [facility]   Imported By: Kassie Mends 02/27/2011 10:43:34  _____________________________________________________________________  External Attachment:    Type:   Image     Comment:   External Document  Appended Document: [facility] please forward to pt's new provider

## 2011-03-08 NOTE — Progress Notes (Signed)
Summary: tramadol, oxycodone  Phone Note Refill Request Call back at Home Phone [phone] Message from:  Patient on February 27, 2011 9:52 AM  Refills Requested: Medication #1:  TRAMADOL HCL 50 MG  TABS 1 tab by mouth two times a day as needed pain  Medication #2:  PERCOCET 10-325 MG  TABS 1 tab by mouth three times a day as needed pain  Method Requested: Pick up at Office Initial call taken by: Melody Comas,  February 27, 2011 9:55 AM    Prescriptions: TRAMADOL HCL 50 MG  TABS (TRAMADOL HCL) 1 tab by mouth two times a day as needed pain  #60 x 0   Entered and Authorized by:   Ruthe Mannan MD   Signed by:   Ruthe Mannan MD on 02/27/2011   Method used:   Print then Give to Patient   RxID:   [id number] PERCOCET 10-325 MG  TABS (OXYCODONE-ACETAMINOPHEN) 1 tab by mouth three times a day as needed pain  #90 x 0   Entered and Authorized by:   Ruthe Mannan MD   Signed by:   Ruthe Mannan MD on 02/27/2011   Method used:   Print then Give to Patient   RxID:   [id number]   Appended Document: tramadol, oxycodone Patient notified of rx's, left up front for pick up.

## 2011-03-08 NOTE — Progress Notes (Signed)
Summary: refill request for topamax  Phone Note Refill Request Message from:  Fax from Pharmacy  Refills Requested: Medication #1:  TOPAMAX 100 MG TABS 1 tab by mouth two times a day   Last Refilled: 01/31/2011 Faxed request from [facility], [phone].  Initial call taken by: Lowella Petties CMA, AAMA,  March 01, 2011 11:17 AM    Prescriptions: TOPAMAX 100 MG TABS (TOPIRAMATE) 1 tab by mouth two times a day  #30 x 0   Entered and Authorized by:   Ruthe Mannan MD   Signed by:   Ruthe Mannan MD on 03/01/2011   Method used:   Electronically to        [facility]* (retail)       [address]       [place], [place]  [address]       Ph: [phone]       Fax: [phone]   RxID:   [id number]

## 2011-03-08 NOTE — Progress Notes (Signed)
Summary: Rx Aciphex & Alprazolam  Phone Note Refill Request Call back at Home Phone [phone] Message from:  Patient on March 01, 2011 10:49 AM  Refills Requested: Medication #1:  ACIPHEX 20 MG TBEC Take 1 tab every morning  Medication #2:  ALPRAZOLAM 1 MG TABS 1 tab by mouth three times a day as needed anxiety Patient is here in the office requesting a written refill.  Please advise.   Method Requested: Pick up at Office Initial call taken by: Linde Gillis CMA Duncan Dull),  March 01, 2011 10:51 AM  Follow-up for Phone Call        Rx printed, signed by Dr. Dayton Martes, and given to [facility] to give to patient. Follow-up by: Linde Gillis CMA Duncan Dull),  March 01, 2011 11:07 AM    Prescriptions: ALPRAZOLAM 1 MG TABS (ALPRAZOLAM) 1 tab by mouth three times a day as needed anxiety  #90 x 0   Entered by:   Linde Gillis CMA (AAMA)   Authorized by:   Ruthe Mannan MD   Signed by:   Linde Gillis CMA (AAMA) on 03/01/2011   Method used:   Print then Give to Patient   RxID:   [id number] ACIPHEX 20 MG TBEC (RABEPRAZOLE SODIUM) Take 1 tab every morning  #30 x 0   Entered by:   Linde Gillis CMA (AAMA)   Authorized by:   Ruthe Mannan MD   Signed by:   Linde Gillis CMA (AAMA) on 03/01/2011   Method used:   Print then Give to Patient   RxID:   [id number] ALPRAZOLAM 1 MG TABS (ALPRAZOLAM) 1 tab by mouth three times a day as needed anxiety  #90 x 0   Entered by:   Linde Gillis CMA (AAMA)   Authorized by:   Ruthe Mannan MD   Signed by:   Linde Gillis CMA (AAMA) on 03/01/2011   Method used:   Handwritten   RxID:   [id number] ACIPHEX 20 MG TBEC (RABEPRAZOLE SODIUM) Take 1 tab every morning  #30 x 0   Entered by:   Linde Gillis CMA (AAMA)   Authorized by:   Ruthe Mannan MD   Signed by:   Linde Gillis CMA (AAMA) on 03/01/2011   Method used:   Handwritten   RxID:   [id number]

## 2011-03-18 LAB — CROSSMATCH
ABO/RH(D): O POS
Antibody Screen: NEGATIVE

## 2011-03-28 ENCOUNTER — Other Ambulatory Visit: Payer: Self-pay | Admitting: Oncology

## 2011-03-28 ENCOUNTER — Encounter (HOSPITAL_BASED_OUTPATIENT_CLINIC_OR_DEPARTMENT_OTHER): Payer: Medicaid Other | Admitting: Oncology

## 2011-03-28 DIAGNOSIS — D472 Monoclonal gammopathy: Secondary | ICD-10-CM

## 2011-03-28 DIAGNOSIS — D638 Anemia in other chronic diseases classified elsewhere: Secondary | ICD-10-CM

## 2011-03-28 DIAGNOSIS — N289 Disorder of kidney and ureter, unspecified: Secondary | ICD-10-CM

## 2011-03-28 DIAGNOSIS — D649 Anemia, unspecified: Secondary | ICD-10-CM

## 2011-03-28 LAB — CBC WITH DIFFERENTIAL/PLATELET
BASO%: 0.2 % (ref 0.0–2.0)
Basophils Absolute: 0 10*3/uL (ref 0.0–0.1)
EOS%: 2.4 % (ref 0.0–7.0)
HCT: 25.9 % — ABNORMAL LOW (ref 34.8–46.6)
LYMPH%: 33.7 % (ref 14.0–49.7)
MCH: 31.4 pg (ref 25.1–34.0)
MCHC: 33.8 g/dL (ref 31.5–36.0)
MCV: 93 fL (ref 79.5–101.0)
MONO%: 6.3 % (ref 0.0–14.0)
NEUT%: 57.4 % (ref 38.4–76.8)
lymph#: 1.2 10*3/uL (ref 0.9–3.3)

## 2011-03-28 LAB — COMPREHENSIVE METABOLIC PANEL
ALT: 26 U/L (ref 0–35)
AST: 25 U/L (ref 0–37)
Alkaline Phosphatase: 145 U/L — ABNORMAL HIGH (ref 39–117)
BUN: 41 mg/dL — ABNORMAL HIGH (ref 6–23)
Creatinine, Ser: 1.28 mg/dL — ABNORMAL HIGH (ref 0.40–1.20)
Total Bilirubin: 0.3 mg/dL (ref 0.3–1.2)

## 2011-03-29 ENCOUNTER — Other Ambulatory Visit: Payer: Self-pay | Admitting: *Deleted

## 2011-03-30 LAB — PROTEIN ELECTROPHORESIS, SERUM
Albumin ELP: 63.5 % (ref 55.8–66.1)
Alpha-1-Globulin: 4.1 % (ref 2.9–4.9)
Beta 2: 3.7 % (ref 3.2–6.5)
Gamma Globulin: 14.4 % (ref 11.1–18.8)

## 2011-03-30 LAB — KAPPA/LAMBDA LIGHT CHAINS: Kappa free light chain: 5.11 mg/dL — ABNORMAL HIGH (ref 0.33–1.94)

## 2011-03-30 MED ORDER — OXYCODONE-ACETAMINOPHEN 10-325 MG PO TABS: ORAL_TABLET | ORAL | Status: DC

## 2011-03-30 MED ORDER — ALPRAZOLAM 1 MG PO TABS: ORAL_TABLET | ORAL | Status: DC

## 2011-04-02 ENCOUNTER — Other Ambulatory Visit: Payer: Self-pay | Admitting: *Deleted

## 2011-04-02 MED ORDER — PROMETHAZINE HCL 25 MG PO TABS: ORAL_TABLET | ORAL | Status: DC

## 2011-04-02 MED ORDER — TOPIRAMATE 100 MG PO TABS: 100.0000 mg | ORAL_TABLET | Freq: Two times a day (BID) | ORAL | Status: DC

## 2011-04-03 ENCOUNTER — Other Ambulatory Visit: Payer: Self-pay | Admitting: *Deleted

## 2011-04-04 MED ORDER — KETOCONAZOLE 2 % EX SHAM: MEDICATED_SHAMPOO | CUTANEOUS | Status: AC

## 2011-04-05 LAB — BONE MARROW EXAM: Bone Marrow Exam: 379

## 2011-04-06 LAB — BASIC METABOLIC PANEL
BUN: 11 mg/dL (ref 6–23)
BUN: 19 mg/dL (ref 6–23)
BUN: 15 mg/dL (ref 6–23)
BUN: 26 mg/dL — ABNORMAL HIGH (ref 6–23)
BUN: 43 mg/dL — ABNORMAL HIGH (ref 6–23)
BUN: 61 mg/dL — ABNORMAL HIGH (ref 6–23)
BUN: 67 mg/dL — ABNORMAL HIGH (ref 6–23)
BUN: 8 mg/dL (ref 6–23)
CO2: 16 mEq/L — ABNORMAL LOW (ref 19–32)
CO2: 25 mEq/L (ref 19–32)
CO2: 18 mEq/L — ABNORMAL LOW (ref 19–32)
CO2: 27 mEq/L (ref 19–32)
CO2: 27 mEq/L (ref 19–32)
CO2: 29 mEq/L (ref 19–32)
Calcium: 8.8 mg/dL (ref 8.4–10.5)
Calcium: 6.2 mg/dL — CL (ref 8.4–10.5)
Calcium: 7.1 mg/dL — ABNORMAL LOW (ref 8.4–10.5)
Calcium: 7.3 mg/dL — ABNORMAL LOW (ref 8.4–10.5)
Calcium: 7.5 mg/dL — ABNORMAL LOW (ref 8.4–10.5)
Calcium: 7.7 mg/dL — ABNORMAL LOW (ref 8.4–10.5)
Chloride: 108 mEq/L (ref 96–112)
Chloride: 111 mEq/L (ref 96–112)
Chloride: 113 mEq/L — ABNORMAL HIGH (ref 96–112)
Chloride: 114 mEq/L — ABNORMAL HIGH (ref 96–112)
Chloride: 116 mEq/L — ABNORMAL HIGH (ref 96–112)
Chloride: 116 mEq/L — ABNORMAL HIGH (ref 96–112)
Creatinine, Ser: 1.01 mg/dL (ref 0.4–1.2)
Creatinine, Ser: 0.86 mg/dL (ref 0.4–1.2)
Creatinine, Ser: 0.87 mg/dL (ref 0.4–1.2)
Creatinine, Ser: 1.14 mg/dL (ref 0.4–1.2)
Creatinine, Ser: 1.21 mg/dL — ABNORMAL HIGH (ref 0.4–1.2)
Creatinine, Ser: 1.66 mg/dL — ABNORMAL HIGH (ref 0.4–1.2)
Creatinine, Ser: 3.08 mg/dL — ABNORMAL HIGH (ref 0.4–1.2)
GFR calc Af Amer: 15 mL/min — ABNORMAL LOW (ref >60)
GFR calc Af Amer: 15 mL/min — ABNORMAL LOW (ref >60)
GFR calc Af Amer: 38 mL/min — ABNORMAL LOW (ref >60)
GFR calc Af Amer: 44 mL/min — ABNORMAL LOW (ref >60)
GFR calc Af Amer: >60 mL/min (ref >60)
GFR calc non Af Amer: >60 mL/min (ref >60)
GFR calc non Af Amer: 12 mL/min — ABNORMAL LOW (ref >60)
GFR calc non Af Amer: 13 mL/min — ABNORMAL LOW (ref >60)
GFR calc non Af Amer: 14 mL/min — ABNORMAL LOW (ref >60)
GFR calc non Af Amer: 32 mL/min — ABNORMAL LOW (ref >60)
GFR calc non Af Amer: 36 mL/min — ABNORMAL LOW (ref >60)
GFR calc non Af Amer: 49 mL/min — ABNORMAL LOW (ref >60)
GFR calc non Af Amer: >60 mL/min (ref >60)
Glucose, Bld: 445 mg/dL — ABNORMAL HIGH (ref 70–99)
Glucose, Bld: 97 mg/dL (ref 70–99)
Glucose, Bld: 279 mg/dL — ABNORMAL HIGH (ref 70–99)
Glucose, Bld: 646 mg/dL (ref 70–99)
Glucose, Bld: 87 mg/dL (ref 70–99)
Potassium: 4.2 mEq/L (ref 3.5–5.1)
Potassium: 4.6 mEq/L (ref 3.5–5.1)
Potassium: 3 mEq/L — ABNORMAL LOW (ref 3.5–5.1)
Potassium: 3.4 mEq/L — ABNORMAL LOW (ref 3.5–5.1)
Potassium: 3.5 mEq/L (ref 3.5–5.1)
Potassium: 3.7 mEq/L (ref 3.5–5.1)
Potassium: 4 mEq/L (ref 3.5–5.1)
Sodium: 144 mEq/L (ref 135–145)
Sodium: 138 mEq/L (ref 135–145)
Sodium: 141 mEq/L (ref 135–145)
Sodium: 143 mEq/L (ref 135–145)
Sodium: 146 mEq/L — ABNORMAL HIGH (ref 135–145)

## 2011-04-06 LAB — GLUCOSE, CAPILLARY
Glucose-Capillary: 306 mg/dL — ABNORMAL HIGH (ref 70–99)
Glucose-Capillary: 335 mg/dL — ABNORMAL HIGH (ref 70–99)
Glucose-Capillary: 424 mg/dL — ABNORMAL HIGH (ref 70–99)
Glucose-Capillary: 55 mg/dL — ABNORMAL LOW (ref 70–99)
Glucose-Capillary: 70 mg/dL (ref 70–99)
Glucose-Capillary: 82 mg/dL (ref 70–99)
Glucose-Capillary: 103 mg/dL — ABNORMAL HIGH (ref 70–99)
Glucose-Capillary: 106 mg/dL — ABNORMAL HIGH (ref 70–99)
Glucose-Capillary: 107 mg/dL — ABNORMAL HIGH (ref 70–99)
Glucose-Capillary: 111 mg/dL — ABNORMAL HIGH (ref 70–99)
Glucose-Capillary: 114 mg/dL — ABNORMAL HIGH (ref 70–99)
Glucose-Capillary: 115 mg/dL — ABNORMAL HIGH (ref 70–99)
Glucose-Capillary: 120 mg/dL — ABNORMAL HIGH (ref 70–99)
Glucose-Capillary: 128 mg/dL — ABNORMAL HIGH (ref 70–99)
Glucose-Capillary: 132 mg/dL — ABNORMAL HIGH (ref 70–99)
Glucose-Capillary: 135 mg/dL — ABNORMAL HIGH (ref 70–99)
Glucose-Capillary: 135 mg/dL — ABNORMAL HIGH (ref 70–99)
Glucose-Capillary: 149 mg/dL — ABNORMAL HIGH (ref 70–99)
Glucose-Capillary: 176 mg/dL — ABNORMAL HIGH (ref 70–99)
Glucose-Capillary: 181 mg/dL — ABNORMAL HIGH (ref 70–99)
Glucose-Capillary: 184 mg/dL — ABNORMAL HIGH (ref 70–99)
Glucose-Capillary: 196 mg/dL — ABNORMAL HIGH (ref 70–99)
Glucose-Capillary: 208 mg/dL — ABNORMAL HIGH (ref 70–99)
Glucose-Capillary: 224 mg/dL — ABNORMAL HIGH (ref 70–99)
Glucose-Capillary: 237 mg/dL — ABNORMAL HIGH (ref 70–99)
Glucose-Capillary: 251 mg/dL — ABNORMAL HIGH (ref 70–99)
Glucose-Capillary: 251 mg/dL — ABNORMAL HIGH (ref 70–99)
Glucose-Capillary: 254 mg/dL — ABNORMAL HIGH (ref 70–99)
Glucose-Capillary: 265 mg/dL — ABNORMAL HIGH (ref 70–99)
Glucose-Capillary: 289 mg/dL — ABNORMAL HIGH (ref 70–99)
Glucose-Capillary: 295 mg/dL — ABNORMAL HIGH (ref 70–99)
Glucose-Capillary: 329 mg/dL — ABNORMAL HIGH (ref 70–99)
Glucose-Capillary: 336 mg/dL — ABNORMAL HIGH (ref 70–99)
Glucose-Capillary: 34 mg/dL — CL (ref 70–99)
Glucose-Capillary: 349 mg/dL — ABNORMAL HIGH (ref 70–99)
Glucose-Capillary: 359 mg/dL — ABNORMAL HIGH (ref 70–99)
Glucose-Capillary: 377 mg/dL — ABNORMAL HIGH (ref 70–99)
Glucose-Capillary: 394 mg/dL — ABNORMAL HIGH (ref 70–99)
Glucose-Capillary: 473 mg/dL — ABNORMAL HIGH (ref 70–99)
Glucose-Capillary: 510 mg/dL (ref 70–99)
Glucose-Capillary: 61 mg/dL — ABNORMAL LOW (ref 70–99)
Glucose-Capillary: 61 mg/dL — ABNORMAL LOW (ref 70–99)
Glucose-Capillary: 75 mg/dL (ref 70–99)
Glucose-Capillary: 80 mg/dL (ref 70–99)
Glucose-Capillary: 86 mg/dL (ref 70–99)
Glucose-Capillary: 87 mg/dL (ref 70–99)
Glucose-Capillary: >600 mg/dL (ref 70–99)
Glucose-Capillary: >600 mg/dL (ref 70–99)
Glucose-Capillary: >600 mg/dL (ref 70–99)
Glucose-Capillary: >600 mg/dL (ref 70–99)
Glucose-Capillary: >600 mg/dL (ref 70–99)

## 2011-04-06 LAB — CBC
HCT: 26.9 % — ABNORMAL LOW (ref 36.0–46.0)
HCT: 27.1 % — ABNORMAL LOW (ref 36.0–46.0)
HCT: 23.2 % — ABNORMAL LOW (ref 36.0–46.0)
HCT: 25.1 % — ABNORMAL LOW (ref 36.0–46.0)
HCT: 26.3 % — ABNORMAL LOW (ref 36.0–46.0)
HCT: 28.1 % — ABNORMAL LOW (ref 36.0–46.0)
HCT: 29.3 % — ABNORMAL LOW (ref 36.0–46.0)
HCT: 31 % — ABNORMAL LOW (ref 36.0–46.0)
Hemoglobin: 8.8 g/dL — ABNORMAL LOW (ref 12.0–15.0)
Hemoglobin: 9.3 g/dL — ABNORMAL LOW (ref 12.0–15.0)
Hemoglobin: 7.3 g/dL — CL (ref 12.0–15.0)
Hemoglobin: 8.2 g/dL — ABNORMAL LOW (ref 12.0–15.0)
Hemoglobin: 8.6 g/dL — ABNORMAL LOW (ref 12.0–15.0)
Hemoglobin: 8.8 g/dL — ABNORMAL LOW (ref 12.0–15.0)
Hemoglobin: 9.5 g/dL — ABNORMAL LOW (ref 12.0–15.0)
Hemoglobin: 9.5 g/dL — ABNORMAL LOW (ref 12.0–15.0)
Hemoglobin: 9.7 g/dL — ABNORMAL LOW (ref 12.0–15.0)
MCHC: 34 g/dL (ref 30.0–36.0)
MCHC: 34 g/dL (ref 30.0–36.0)
MCHC: 34.2 g/dL (ref 30.0–36.0)
MCHC: 33.6 g/dL (ref 30.0–36.0)
MCHC: 33.6 g/dL (ref 30.0–36.0)
MCHC: 34.2 g/dL (ref 30.0–36.0)
MCHC: 34.2 g/dL (ref 30.0–36.0)
MCHC: 34.4 g/dL (ref 30.0–36.0)
MCHC: 34.5 g/dL (ref 30.0–36.0)
MCHC: 34.6 g/dL (ref 30.0–36.0)
MCHC: 35.1 g/dL (ref 30.0–36.0)
MCHC: 35.3 g/dL (ref 30.0–36.0)
MCV: 95.8 fL (ref 78.0–100.0)
MCV: 96.8 fL (ref 78.0–100.0)
MCV: 97.2 fL (ref 78.0–100.0)
MCV: 94.5 fL (ref 78.0–100.0)
MCV: 95 fL (ref 78.0–100.0)
MCV: 95.3 fL (ref 78.0–100.0)
MCV: 96 fL (ref 78.0–100.0)
MCV: 96.4 fL (ref 78.0–100.0)
Platelets: 226 10*3/uL (ref 150–400)
Platelets: 235 10*3/uL (ref 150–400)
Platelets: 110 10*3/uL — ABNORMAL LOW (ref 150–400)
Platelets: 114 10*3/uL — ABNORMAL LOW (ref 150–400)
Platelets: 127 10*3/uL — ABNORMAL LOW (ref 150–400)
Platelets: 165 10*3/uL (ref 150–400)
Platelets: 176 10*3/uL (ref 150–400)
Platelets: 231 10*3/uL (ref 150–400)
Platelets: 89 10*3/uL — ABNORMAL LOW (ref 150–400)
RBC: 2.65 MIL/uL — ABNORMAL LOW (ref 3.87–5.11)
RBC: 2.8 MIL/uL — ABNORMAL LOW (ref 3.87–5.11)
RBC: 2.22 MIL/uL — ABNORMAL LOW (ref 3.87–5.11)
RBC: 2.44 MIL/uL — ABNORMAL LOW (ref 3.87–5.11)
RBC: 2.48 MIL/uL — ABNORMAL LOW (ref 3.87–5.11)
RBC: 2.73 MIL/uL — ABNORMAL LOW (ref 3.87–5.11)
RBC: 2.88 MIL/uL — ABNORMAL LOW (ref 3.87–5.11)
RBC: 2.93 MIL/uL — ABNORMAL LOW (ref 3.87–5.11)
RBC: 3.01 MIL/uL — ABNORMAL LOW (ref 3.87–5.11)
RBC: 3.07 MIL/uL — ABNORMAL LOW (ref 3.87–5.11)
RBC: 3.09 MIL/uL — ABNORMAL LOW (ref 3.87–5.11)
RBC: 3.27 MIL/uL — ABNORMAL LOW (ref 3.87–5.11)
RDW: 13.4 % (ref 11.5–15.5)
RDW: 13.8 % (ref 11.5–15.5)
RDW: 13.8 % (ref 11.5–15.5)
RDW: 12.5 % (ref 11.5–15.5)
RDW: 13.4 % (ref 11.5–15.5)
RDW: 13.4 % (ref 11.5–15.5)
WBC: 4.2 K/uL (ref 4.0–10.5)
WBC: 10.5 10*3/uL (ref 4.0–10.5)
WBC: 12.2 10*3/uL — ABNORMAL HIGH (ref 4.0–10.5)
WBC: 6.1 10*3/uL (ref 4.0–10.5)
WBC: 6.6 10*3/uL (ref 4.0–10.5)
WBC: 6.8 10*3/uL (ref 4.0–10.5)
WBC: 6.9 10*3/uL (ref 4.0–10.5)
WBC: 7.7 10*3/uL (ref 4.0–10.5)
WBC: 7.8 10*3/uL (ref 4.0–10.5)
WBC: 9.2 10*3/uL (ref 4.0–10.5)
WBC: 9.9 10*3/uL (ref 4.0–10.5)

## 2011-04-06 LAB — CARDIAC PANEL(CRET KIN+CKTOT+MB+TROPI)
Relative Index: 1.1 (ref 0.0–2.5)
Relative Index: 1.5 (ref 0.0–2.5)
Total CK: 2694 U/L — ABNORMAL HIGH (ref 7–177)
Total CK: 2835 U/L — ABNORMAL HIGH (ref 7–177)
Total CK: 2882 U/L — ABNORMAL HIGH (ref 7–177)
Troponin I: 5.19 ng/mL (ref 0.00–0.06)
Troponin I: 6.46 ng/mL (ref 0.00–0.06)
Troponin I: 6.65 ng/mL (ref 0.00–0.06)
Troponin I: 7.16 ng/mL (ref 0.00–0.06)

## 2011-04-06 LAB — URINALYSIS, ROUTINE W REFLEX MICROSCOPIC
Bilirubin Urine: NEGATIVE
Glucose, UA: >1000 mg/dL — AB
Ketones, ur: NEGATIVE mg/dL
Leukocytes, UA: NEGATIVE
Leukocytes, UA: NEGATIVE
Nitrite: NEGATIVE
Specific Gravity, Urine: 1.004 — ABNORMAL LOW (ref 1.005–1.030)
Specific Gravity, Urine: 1.022 (ref 1.005–1.030)
Urobilinogen, UA: 0.2 mg/dL (ref 0.0–1.0)
pH: 7 (ref 5.0–8.0)
pH: 6.5 (ref 5.0–8.0)

## 2011-04-06 LAB — RENAL FUNCTION PANEL
BUN: 46 mg/dL — ABNORMAL HIGH (ref 6–23)
CO2: 17 mEq/L — ABNORMAL LOW (ref 19–32)
CO2: 17 mEq/L — ABNORMAL LOW (ref 19–32)
CO2: 23 mEq/L (ref 19–32)
Calcium: 5.9 mg/dL — CL (ref 8.4–10.5)
Calcium: 6.3 mg/dL — CL (ref 8.4–10.5)
GFR calc Af Amer: 18 mL/min — ABNORMAL LOW (ref >60)
GFR calc non Af Amer: 15 mL/min — ABNORMAL LOW (ref >60)
GFR calc non Af Amer: 18 mL/min — ABNORMAL LOW (ref >60)
Glucose, Bld: 136 mg/dL — ABNORMAL HIGH (ref 70–99)
Glucose, Bld: 499 mg/dL — ABNORMAL HIGH (ref 70–99)
Phosphorus: 2.7 mg/dL (ref 2.3–4.6)
Phosphorus: 3.5 mg/dL (ref 2.3–4.6)
Potassium: 2.9 mEq/L — ABNORMAL LOW (ref 3.5–5.1)
Potassium: 3.5 mEq/L (ref 3.5–5.1)
Potassium: 3.8 mEq/L (ref 3.5–5.1)
Sodium: 136 mEq/L (ref 135–145)
Sodium: 141 mEq/L (ref 135–145)
Sodium: 150 mEq/L — ABNORMAL HIGH (ref 135–145)

## 2011-04-06 LAB — URINE MICROSCOPIC-ADD ON

## 2011-04-06 LAB — PHOSPHORUS
Phosphorus: 2.7 mg/dL (ref 2.3–4.6)
Phosphorus: 3.1 mg/dL (ref 2.3–4.6)
Phosphorus: 3.2 mg/dL (ref 2.3–4.6)

## 2011-04-06 LAB — CALCIUM, IONIZED
Calcium, Ion: 0.79 mmol/L — ABNORMAL LOW (ref 1.12–1.32)
Calcium, Ion: 0.9 mmol/L — ABNORMAL LOW (ref 1.12–1.32)

## 2011-04-06 LAB — COMPREHENSIVE METABOLIC PANEL
ALT: 39 U/L — ABNORMAL HIGH (ref 0–35)
AST: 31 U/L (ref 0–37)
AST: 35 U/L (ref 0–37)
Albumin: 1.6 g/dL — ABNORMAL LOW (ref 3.5–5.2)
CO2: 26 mEq/L (ref 19–32)
Calcium: 7.1 mg/dL — ABNORMAL LOW (ref 8.4–10.5)
Calcium: 7.4 mg/dL — ABNORMAL LOW (ref 8.4–10.5)
Chloride: 114 mEq/L — ABNORMAL HIGH (ref 96–112)
Creatinine, Ser: 1.12 mg/dL (ref 0.4–1.2)
GFR calc Af Amer: 60 mL/min — ABNORMAL LOW (ref >60)
GFR calc Af Amer: >60 mL/min (ref >60)
GFR calc non Af Amer: 49 mL/min — ABNORMAL LOW (ref >60)
Sodium: 145 mEq/L (ref 135–145)
Total Protein: 4 g/dL — ABNORMAL LOW (ref 6.0–8.3)

## 2011-04-06 LAB — POCT I-STAT 3, ART BLOOD GAS (G3+)
Acid-base deficit: 13 mmol/L — ABNORMAL HIGH (ref 0.0–2.0)
Acid-base deficit: 7 mmol/L — ABNORMAL HIGH (ref 0.0–2.0)
Bicarbonate: 10.2 mEq/L — ABNORMAL LOW (ref 20.0–24.0)
Bicarbonate: 11.2 mEq/L — ABNORMAL LOW (ref 20.0–24.0)
Bicarbonate: 19.8 mEq/L — ABNORMAL LOW (ref 20.0–24.0)
Bicarbonate: 9.2 mEq/L — ABNORMAL LOW (ref 20.0–24.0)
O2 Saturation: 99 %
O2 Saturation: 99 %
O2 Saturation: 99 %
O2 Saturation: 99 %
Patient temperature: 37.3
Patient temperature: 37.4
Patient temperature: 37.4
Patient temperature: 37.4
Patient temperature: 99.2
TCO2: 11 mmol/L (ref 0–100)
TCO2: 12 mmol/L (ref 0–100)
TCO2: 17 mmol/L (ref 0–100)
TCO2: 18 mmol/L (ref 0–100)
TCO2: 21 mmol/L (ref 0–100)
pCO2 arterial: 16.2 mmHg — CL (ref 35.0–45.0)
pCO2 arterial: 20.9 mmHg — ABNORMAL LOW (ref 35.0–45.0)
pH, Arterial: 7.342 — ABNORMAL LOW (ref 7.350–7.400)
pH, Arterial: 7.36 (ref 7.350–7.400)
pH, Arterial: 7.402 — ABNORMAL HIGH (ref 7.350–7.400)
pH, Arterial: 7.405 — ABNORMAL HIGH (ref 7.350–7.400)
pH, Arterial: 7.42 — ABNORMAL HIGH (ref 7.350–7.400)
pO2, Arterial: 148 mmHg — ABNORMAL HIGH (ref 80.0–100.0)
pO2, Arterial: 161 mmHg — ABNORMAL HIGH (ref 80.0–100.0)

## 2011-04-06 LAB — CLOSTRIDIUM DIFFICILE EIA
C difficile Toxins A+B, EIA: NEGATIVE
C difficile Toxins A+B, EIA: NEGATIVE
C difficile Toxins A+B, EIA: NEGATIVE

## 2011-04-06 LAB — DIFFERENTIAL
Band Neutrophils: 0 % (ref 0–10)
Basophils Absolute: 0 10*3/uL (ref 0.0–0.1)
Basophils Relative: 1 % (ref 0–1)
Blasts: 0 %
Eosinophils Absolute: 0.1 10*3/uL (ref 0.0–0.7)
Eosinophils Relative: 1 % (ref 0–5)
Lymphocytes Relative: 5 % — ABNORMAL LOW (ref 12–46)
Lymphs Abs: 0.5 10*3/uL — ABNORMAL LOW (ref 0.7–4.0)
Monocytes Absolute: 0.6 10*3/uL (ref 0.1–1.0)
Monocytes Relative: 11 % (ref 3–12)
Neutro Abs: 3.6 10*3/uL (ref 1.7–7.7)
Neutro Abs: 9.6 10*3/uL — ABNORMAL HIGH (ref 1.7–7.7)
Neutrophils Relative %: 91 % — ABNORMAL HIGH (ref 43–77)
Promyelocytes Absolute: 0 %
WBC Morphology: INCREASED
nRBC: 0 /100 WBC

## 2011-04-06 LAB — STOOL CULTURE

## 2011-04-06 LAB — TYPE AND SCREEN
ABO/RH(D): O POS
Antibody Screen: NEGATIVE

## 2011-04-06 LAB — BASIC METABOLIC PANEL WITH GFR
Chloride: 112 meq/L (ref 96–112)
Creatinine, Ser: 0.79 mg/dL (ref 0.4–1.2)
GFR calc Af Amer: >60 mL/min (ref >60)

## 2011-04-06 LAB — POCT I-STAT, CHEM 8
BUN: 24 mg/dL — ABNORMAL HIGH (ref 6–23)
Calcium, Ion: 1.19 mmol/L (ref 1.12–1.32)
Chloride: 109 mEq/L (ref 96–112)
Creatinine, Ser: 0.8 mg/dL (ref 0.4–1.2)
Glucose, Bld: 176 mg/dL — ABNORMAL HIGH (ref 70–99)
TCO2: 23 mmol/L (ref 0–100)

## 2011-04-06 LAB — APTT: aPTT: 26 seconds (ref 24–37)

## 2011-04-06 LAB — MAGNESIUM
Magnesium: 1.5 mg/dL (ref 1.5–2.5)
Magnesium: 1.7 mg/dL (ref 1.5–2.5)
Magnesium: 1.7 mg/dL (ref 1.5–2.5)
Magnesium: 1.9 mg/dL (ref 1.5–2.5)

## 2011-04-06 LAB — FECAL LACTOFERRIN, QUANT

## 2011-04-06 LAB — URINE CULTURE

## 2011-04-06 LAB — HEPARIN INDUCED THROMBOCYTOPENIA PNL: Heparin Induced Plt Ab: NEGATIVE

## 2011-04-07 LAB — CARDIAC PANEL(CRET KIN+CKTOT+MB+TROPI)
CK, MB: 34.6 ng/mL — ABNORMAL HIGH (ref 0.3–4.0)
Relative Index: 1.7 (ref 0.0–2.5)
Total CK: 2013 U/L — ABNORMAL HIGH (ref 7–177)

## 2011-04-07 LAB — POCT I-STAT, CHEM 8
Calcium, Ion: 1.29 mmol/L (ref 1.12–1.32)
Creatinine, Ser: 5 mg/dL — ABNORMAL HIGH (ref 0.4–1.2)
Glucose, Bld: >700 mg/dL (ref 70–99)
Hemoglobin: 8.5 g/dL — ABNORMAL LOW (ref 12.0–15.0)
Potassium: 7.9 mEq/L (ref 3.5–5.1)

## 2011-04-07 LAB — CULTURE, BLOOD (ROUTINE X 2): Culture: NO GROWTH

## 2011-04-07 LAB — BLOOD GAS, ARTERIAL
Acid-base deficit: 22.8 mmol/L — ABNORMAL HIGH (ref 0.0–2.0)
Drawn by: 229971
FIO2: 1 %
MECHVT: 450 mL
O2 Saturation: 99.9 %
RATE: 35 resp/min
TCO2: 5.2 mmol/L (ref 0–100)

## 2011-04-07 LAB — CBC
HCT: 25 % — ABNORMAL LOW (ref 36.0–46.0)
MCV: 101.3 fL — ABNORMAL HIGH (ref 78.0–100.0)
Platelets: 100 10*3/uL — ABNORMAL LOW (ref 150–400)
RBC: 2.47 MIL/uL — ABNORMAL LOW (ref 3.87–5.11)
WBC: 27.9 10*3/uL — ABNORMAL HIGH (ref 4.0–10.5)

## 2011-04-07 LAB — COMPREHENSIVE METABOLIC PANEL
ALT: 34 U/L (ref 0–35)
ALT: 38 U/L — ABNORMAL HIGH (ref 0–35)
ALT: 38 U/L — ABNORMAL HIGH (ref 0–35)
AST: 57 U/L — ABNORMAL HIGH (ref 0–37)
AST: 75 U/L — ABNORMAL HIGH (ref 0–37)
Albumin: 2.2 g/dL — ABNORMAL LOW (ref 3.5–5.2)
Alkaline Phosphatase: 131 U/L — ABNORMAL HIGH (ref 39–117)
Alkaline Phosphatase: 131 U/L — ABNORMAL HIGH (ref 39–117)
CO2: 6 mEq/L — CL (ref 19–32)
CO2: 7 mEq/L — CL (ref 19–32)
Calcium: 6.7 mg/dL — ABNORMAL LOW (ref 8.4–10.5)
Chloride: 106 mEq/L (ref 96–112)
Chloride: 112 mEq/L (ref 96–112)
Creatinine, Ser: 4.07 mg/dL — ABNORMAL HIGH (ref 0.4–1.2)
Creatinine, Ser: 4.2 mg/dL — ABNORMAL HIGH (ref 0.4–1.2)
GFR calc Af Amer: 12 mL/min — ABNORMAL LOW (ref >60)
GFR calc Af Amer: 13 mL/min — ABNORMAL LOW (ref >60)
GFR calc Af Amer: 14 mL/min — ABNORMAL LOW (ref >60)
GFR calc non Af Amer: 10 mL/min — ABNORMAL LOW (ref >60)
GFR calc non Af Amer: 11 mL/min — ABNORMAL LOW (ref >60)
GFR calc non Af Amer: 11 mL/min — ABNORMAL LOW (ref >60)
Glucose, Bld: 981 mg/dL (ref 70–99)
Potassium: 4 mEq/L (ref 3.5–5.1)
Sodium: 132 mEq/L — ABNORMAL LOW (ref 135–145)
Sodium: 134 mEq/L — ABNORMAL LOW (ref 135–145)
Sodium: 135 mEq/L (ref 135–145)
Total Bilirubin: 1.2 mg/dL (ref 0.3–1.2)
Total Bilirubin: 1.4 mg/dL — ABNORMAL HIGH (ref 0.3–1.2)
Total Protein: 4.7 g/dL — ABNORMAL LOW (ref 6.0–8.3)

## 2011-04-07 LAB — POCT CARDIAC MARKERS
CKMB, poc: 5.7 ng/mL (ref 1.0–8.0)
CKMB, poc: 7.8 ng/mL (ref 1.0–8.0)
Myoglobin, poc: 290 ng/mL (ref 12–200)
Troponin i, poc: 2.39 ng/mL (ref 0.00–0.09)

## 2011-04-07 LAB — URINALYSIS, ROUTINE W REFLEX MICROSCOPIC
Ketones, ur: 15 mg/dL — AB
Protein, ur: 100 mg/dL — AB
Urobilinogen, UA: 0.2 mg/dL (ref 0.0–1.0)

## 2011-04-07 LAB — BRAIN NATRIURETIC PEPTIDE
Pro B Natriuretic peptide (BNP): 882 pg/mL — ABNORMAL HIGH (ref 0.0–100.0)
Pro B Natriuretic peptide (BNP): 952 pg/mL — ABNORMAL HIGH (ref 0.0–100.0)

## 2011-04-07 LAB — POCT I-STAT 3, ART BLOOD GAS (G3+)
Acid-base deficit: 25 mmol/L — ABNORMAL HIGH (ref 0.0–2.0)
pCO2 arterial: 25 mmHg — ABNORMAL LOW (ref 35.0–45.0)
pH, Arterial: 6.878 — CL (ref 7.350–7.400)
pO2, Arterial: 534 mmHg — ABNORMAL HIGH (ref 80.0–100.0)

## 2011-04-07 LAB — GLUCOSE, CAPILLARY: Glucose-Capillary: >600 mg/dL (ref 70–99)

## 2011-04-07 LAB — URINE CULTURE
Colony Count: NO GROWTH
Culture: NO GROWTH

## 2011-04-07 LAB — PROTIME-INR: INR: 1.6 — ABNORMAL HIGH (ref 0.00–1.49)

## 2011-04-07 LAB — CK TOTAL AND CKMB (NOT AT ARMC): Total CK: 1056 U/L — ABNORMAL HIGH (ref 7–177)

## 2011-04-07 LAB — CROSSMATCH
ABO/RH(D): O POS
Antibody Screen: NEGATIVE

## 2011-04-07 LAB — CORTISOL: Cortisol, Plasma: 24 ug/dL

## 2011-04-07 LAB — CULTURE, RESPIRATORY W GRAM STAIN

## 2011-04-07 LAB — OSMOLALITY: Osmolality: 361 mOsm/kg — ABNORMAL HIGH (ref 275–300)

## 2011-04-07 LAB — CARBOXYHEMOGLOBIN: Carboxyhemoglobin: 1.4 % (ref 0.5–1.5)

## 2011-04-07 LAB — URINE MICROSCOPIC-ADD ON

## 2011-04-07 LAB — DIFFERENTIAL
Basophils Relative: 0 % (ref 0–1)
Eosinophils Relative: 0 % (ref 0–5)
Lymphs Abs: 3.9 10*3/uL (ref 0.7–4.0)
Monocytes Absolute: 1.7 10*3/uL — ABNORMAL HIGH (ref 0.1–1.0)

## 2011-04-07 LAB — RAPID URINE DRUG SCREEN, HOSP PERFORMED
Cocaine: NOT DETECTED
Tetrahydrocannabinol: NOT DETECTED

## 2011-04-07 LAB — KETONES, QUALITATIVE

## 2011-04-07 LAB — TROPONIN I: Troponin I: 3.77 ng/mL (ref 0.00–0.06)

## 2011-04-07 LAB — APTT: aPTT: 36 seconds (ref 24–37)

## 2011-04-07 LAB — D-DIMER, QUANTITATIVE: D-Dimer, Quant: 2.24 ug/mL-FEU — ABNORMAL HIGH (ref 0.00–0.48)

## 2011-04-19 ENCOUNTER — Other Ambulatory Visit: Payer: Self-pay | Admitting: *Deleted

## 2011-04-19 NOTE — Telephone Encounter (Signed)
Faxed refill request from [facility] for lantus.  Are we still refilling pt's meds?  Faxed request is on your desk.

## 2011-04-20 MED ORDER — INSULIN GLARGINE 100 UNIT/ML ~~LOC~~ SOLN: 15.0000 [IU] | SUBCUTANEOUS | Status: DC

## 2011-04-20 NOTE — Telephone Encounter (Signed)
It's in my box for one refill, after that we are no longer refilling medications which is what I wrote on the form. She is no longer a patient here and by now should have had time to establish with another provider. [name] should know who her new provider is.

## 2011-04-20 NOTE — Telephone Encounter (Signed)
Addended by: Linde Gillis on: 04/20/2011 02:17 PM   Modules accepted: Orders

## 2011-04-20 NOTE — Telephone Encounter (Signed)
Rx for Lantus faxed to [facility] pharmacy at [phone].

## 2011-05-15 NOTE — Consult Note (Signed)
Latoya [name], [name]            ACCOUNT NO.:  [id number]   MEDICAL RECORD NO.:  [id number]          PATIENT TYPE:  INP   LOCATION:  6524                         FACILITY:  [facility]   PHYSICIAN:  Everardo Beals. Juanda Chance, MD, FACCDATE OF BIRTH:  1951-08-12   DATE OF CONSULTATION:  08/04/2008  DATE OF DISCHARGE:                                 CONSULTATION   REQUESTING PHYSICIAN:  Dr. Johney Maine, M.D.   PRIMARY CARDIOLOGIST:  Noralyn Pick. Eden Emms, MD, [facility]   REASON FOR CONSULTATION:  Chest pain, and the patient with known CAD.   HISTORY OF PRESENT ILLNESS:  A 60 year old Caucasian female with history  of CAD with stent to the circumflex in 2004 at [facility],  hypercholesterolemia, hypertension, brittle diabetes, and depression,  who was scheduled as an outpatient for cardiac catheterization August 13, 2008, who presented to the emergency room with symptoms of unstable  angina after calling her physician's office complaining of clamminess  and achy chest pain.  Of note, the patient was seen by her primary care  physician earlier that day secondary to hyperkalemia with potassium of  6.2, a recheck with the potassium of 4.9.  The patient was advised to be  admitted to the hospital and she refused, but when she got home she  began to have some symptoms and called back her physician who advised a  emergency room admission.  The patient describes the pain as achy,  similar to the pain she had prior to the stents placed at [facility]  with cold clamminess, lightheadedness, nonradiating, no associated  nausea or vomiting.   REVIEW OF SYSTEMS:  Positive for chest pain, shortness of breath,  depression, anxiety, cool clamminess, and lightheadedness.   PAST MEDICAL HISTORY:  CAD, stent of the circ in 2004 at [facility],  anemia, DVT, diabetes, fibromyalgia, hypertension, hyperlipidemia, GERD,  depression with psychiatric issues.   PAST SURGICAL HISTORY:  Hysterectomy.   SOCIAL:  She lives in  [place] alone.  She is divorced.  She has 2  grown children.  Does not smoke, does not drink alcohol.  The patient is  illiterate.   FAMILY HISTORY:  Mother with diabetes, CAD, and breast cancer.  Father  with type 2 diabetes and CAD, and siblings with hypertension.   CURRENT MEDICATIONS:  At home,  1. Gabapentin 600 mg t.i.d.  2. Simvastatin 20 mg daily.  3. Percocet 10/325 mg daily.  4. Aciphex 20 mg daily.  5. Aspirin 81 mg daily.  6. Sertraline 25 mg daily.  7. Lisinopril 20 mg at bedtime.  8. Toprol-XL 25 mg daily.  9. Topamax 100 mg b.i.d.  10.Alprazolam 0.5 mg daily.  11.Promethazine 25 mg IV p.r.n.  12.NovoLog insulin per sliding scale.  13.Lantus 18 units nightly.  14.Nitroglycerin p.r.n.  15.Plavix 75 mg daily.  16.Morphine p.r.n.   ALLERGIES:  To CODEINE and PENICILLIN, causing nausea and vomiting.   CURRENT LABS:  Hemoglobin 8.7, hematocrit 25.6, white blood cells 5.6,  platelets 193, sodium 140, potassium 5.0, chloride 115, CO2 23, BUN 30,  creatinine 1.2, glucose 360, d-dimer 0.27, troponin less than 0.05,  0.01,  and 0.01 respectively, cholesterol 129, triglycerides 130, HDL 41,  LDL 65.   EKG revealing normal sinus rhythm with a ventricular rate of 66 beats  per minute.  Chest x-ray revealing no active disease.   PHYSICAL EXAM:  VITAL SINGS:  Blood pressure 129/52, heart rate 64,  respirations 14, temperature 98.3, O2 sat 99% on room air.  HEENT:  Head is normocephalic and atraumatic.  Eyes, PERRLA.  Mucous  membranes of mouth pink and moist.  Tongue is midline.  NECK:  Supple without JVD or carotid bruits appreciated.  CARDIOVASCULAR:  Regular rate and rhythm without murmurs, rubs, or  gallops.  LUNGS:  Clear to auscultation.  ABDOMEN:  Soft, nontender.  2+ bowel sounds.  Femoral pulses are  palpable.  Dorsalis pedis pulses and radial pulses are palpable.  SKIN:  Pale and dry.  NEURO:  Intact.   IMPRESSION:  1. Unstable angina.  2. Coronary  artery disease status post stent placed in 2004.  3. Diabetes.  4. Hyperlipidemia.  5. Hyperkalemia.  6. History of anemia.   PLAN:  The patient has been seen and examined by myself and Dr. Charlies Constable at that time.  The patient had been scheduled for a cardiac  catheterization on August 13, 2008, but had recurrent symptoms at rest  along with hyperkalemia and was advised by her family practice physician  to be admitted.  The patient will be scheduled for a cardiac  catheterization during this hospitalization and have further evaluation  concerning coronary artery disease.  The patient now agrees to proceed  with cardiac catheterization during this hospitalization.  The patient  will also have ACE inhibitor discontinued secondary to hyperkalemia.  Of  note, according to primary care physician, the patient was not taking an  ACE inhibitor as an outpatient and had hyperkalemia as well.  They told  me, it may have  been related to her diabetes.  However, we will discontinue the  lisinopril that she was prescribed here in the hospital and continue to  follow BMET and CBC, as she is mildly anemic.  We will make further  recommendations post cardiac catheterization.      Bettey Mare. Lyman Bishop, NP      Everardo Beals. Juanda Chance, MD, [facility]  Electronically Signed    KML/MEDQ  D:  08/04/2008  T:  08/05/2008  Job:  [id number]   cc:   Johney Maine, M.D.

## 2011-05-15 NOTE — Discharge Summary (Signed)
[name], JAFFEE NO.:  [id number]   MEDICAL RECORD NO.:  [id number]          PATIENT TYPE:  INP   LOCATION:  6524                         FACILITY:  [facility]   PHYSICIAN:  Nestor Ramp, MD        DATE OF BIRTH:  20-Feb-1951   DATE OF ADMISSION:  08/03/2008  DATE OF DISCHARGE:  08/06/2008                               DISCHARGE SUMMARY   PRIMARY CARE Quantarius Genrich:  Johney Maine, MD.   DISCHARGE DIAGNOSES:  1. Coronary artery disease.  2. Diabetes mellitus.  3. Hyperkalemia.  4. Hypertension.   DISCHARGE MEDICATIONS:  1. Xanax 0.5 mg, take 1 daily.  2. Aspirin 81 mg daily.  3. Neurontin 600 mg t.i.d.  4. Lantus 12 units q.a.m.  5. Sliding scale insulin with meals.  6. Toprol-XL 25 mg daily.  7. Oxycodone 5 mg b.i.d.  8. Protonix 40 mg daily.  9. Zoloft 25 mg daily.  10.Zocor 20 mg daily.  11.Topamax 100 mg b.i.d.   CONSULTS:  [facility].   PROCEDURES:  She had a catheterization on August 05, 2008.   PERTINENT LABS:  She had a cardiac enzymes, were negative x3.  At  admission potassium was 5, blood pressure was 161/70.  Telemetry on the  floor showed normal sinus rhythm throughout her stay.  She had 2  hypoglycemic blood glucose at midnight on August 04, 2008, was in the 50s  and then similarly low on August 05, 2008, at midnight.   HOSPITAL COURSE:  This is a 60 year old female with past medical history  significant for previous coronary artery disease and stent placement who  presents with unstable angina.   1. Unstable angina.  Upon admission, we consulted Cardiology and they      decided to go ahead and catheterize the patient.  The patient was      found to have no obstructive coronary artery disease with lesions      50% or less stenosis, but the original stent was patent.      Symptomatically, the patient did not experience any chest pain      after the first day of admission.  2. Insulin-dependent diabetes.  The patient's blood glucose  was      elevated on admission.  It was under 200 during the majority of her      stay in the hospital.  She would become hypoglycemic around      midnight, so Lantus was changed 12 units and changed to      administration in the morning.  Diabetes management will continue      to be monitoring in an outpatient setting.  3. Hyperkalemia.  The patient was at the upper level of normal on      admission and continued to be at the upper level during her stay.      ACE inhibitor was discontinued to prevent further hyperkalemic      episodes.  4. Hypertension.  Blood pressure was elevated at admission and      fluctuated from normotensive to above goal throughout her stay, we  will manage in the outpatient setting.   DISCHARGE INSTRUCTIONS:  Diet.  She will be on American Diabetes  Association Diet.  She should return to the emergency department if she  continues to have chest pain that is more than normal.  If she is having  symptoms of hypoglycemic, she should return or if having symptoms of  hyperkalemia.   FOLLOWUP APPOINTMENT:  She has an appointment scheduled with Dr. Karn Pickler  on August 24, 2008, at 2:45.   DISCHARGE CONDITION:  The patient was discharged home in stable medical  condition.      Sylvan Cheese, M.D.  Electronically Signed      Nestor Ramp, MD  Electronically Signed    MJ/MEDQ  D:  08/06/2008  T:  08/07/2008  Job:  [id number]   cc:   [facility]

## 2011-05-15 NOTE — Cardiovascular Report (Signed)
[name], [name]            ACCOUNT NO.:  [id number]   MEDICAL RECORD NO.:  [id number]          PATIENT TYPE:  INP   LOCATION:  6524                         FACILITY:  [facility]   PHYSICIAN:  Everardo Beals. Juanda Chance, MD, FACCDATE OF BIRTH:  1951/04/06   DATE OF PROCEDURE:  08/05/2008  DATE OF DISCHARGE:                            CARDIAC CATHETERIZATION   CLINICAL HISTORY:  [name] is a 60 years old had a previous stent  placed to the circumflex artery in [place].  She is recently seen by  Dr. Eden Emms and is scheduled for evaluation with catheterization because  of recurrent chest pain.  She was admitted prematurely because of an  elevated potassium, and we were asked to see her in consult and decided  to proceed with cath while she was here.   PROCEDURE:  The procedure was performed by the right femoral arteries,  arterial sheath, and 5-French Performa coronary catheters.  A front wall  arterial puncture was performed, and Omnipaque contrast was used.  The  patient tolerated the procedure well and left the laboratory in  satisfactory condition.   RESULTS:  The left main coronary artery.  The left main coronary artery  is free of any significant disease.   Left anterior descending artery.  Left anterior descending artery gave  rise to a diagonal branch and two septal perforators as well as a small  diagonal branch.  There was 50% proximal stenosis in the LAD.   The circumflex artery.  The circumflex artery gave rise to a large  marginal branch and a small AV branch.  There was a stent beginning in  the proximal circumflex artery and extending into the marginal branch.  This had less than 10% stenosis.  There was 40% narrowing in the  proximal edge of the stent.   The right coronary artery.  The right coronary artery has a large  dominant vessel that gave rise to two right ventricular branch, the  posterior descending branch, and three posterolateral branches.  There  was 30%  narrowing in the proximal right coronary artery.  Thus, the  vessel is free of significant disease.   The left ventriculogram.  The left ventriculogram performed in the RAO  projection showed good wall motion with no evidence of hypokinesis.  Estimated ejection fraction was 60%.   CONCLUSION:  Mild nonobstructive coronary artery disease with 50%  narrowing in the proximal LAD, 40% narrowing at the proximal edge of the  stent in the circumflex artery with 0% stenosis at the stent less than  10% stenosis at the stent site, 30% narrowing in the proximal right  coronary artery, and normal LV function.   RECOMMENDATIONS:  Reassurance.  On reviewing these findings, I do not  think the patient's recent symptoms are cardiac in etiology.  We will  arrange followup with Dr. Eden Emms in a few weeks.      Bruce Elvera Lennox Juanda Chance, MD, [facility]  Electronically Signed     BRB/MEDQ  D:  08/05/2008  T:  08/06/2008  Job:  [id number]   cc:   Noralyn Pick. Eden Emms, MD, [facility]  Family Practice Teaching  Service  Cardiopulmonary Lab

## 2011-05-15 NOTE — H&P (Signed)
[name], [name] NO.:  [id number]   MEDICAL RECORD NO.:  [id number]          PATIENT TYPE:  INP   LOCATION:  6524                         FACILITY:  [facility]   PHYSICIAN:  Latoya Ramp, MD        DATE OF BIRTH:  October 18, 1951   DATE OF ADMISSION:  08/03/2008  DATE OF DISCHARGE:                              HISTORY & PHYSICAL   PRIMARY CARE [name]:  [name], M.D.   CHIEF COMPLAINT:  Chest pain.   HISTORY OF PRESENT ILLNESS:  [name] is a 60 year old with past  medical history significant for coronary artery disease with stent  placement in the proximal circumflex 5 years ago, who presents with  chest pain of increased frequency.  It is midsternal in the left breast  area and radiates to the neck and left arm.  This worsened by exertion,  but does not relieved with rest.  It has a dull, sharp, and burning  sensation at different times.  It is also not aggravated with meals.  It  is associated with diaphoresis, nausea, and feeling weak.  She does not  take her nitroglycerin so does not know if this would relieve her pain.  She is scheduled to undergo cardiac cath on August 13, 2008, with  [name].  Of note, she was sent to the ED for elevated  potassium at 6.2 at the family practice office this afternoon.   PAST MEDICAL HISTORY:  Anemia-age 60, coronary artery disease-age 60,  stent to the proximal circumflex, DVT-age 41, diabetes mellitus type 1-  in her 65s, fibromyalgia, hyperlipidemia-age 57, hypertension-age 57,  neuropathy, and GERD.   PAST SURGICAL HISTORY:  Hysterectomy-age 60.   SOCIAL HISTORY:  Lives at [place], alone, is disabled, single  currently, has no history of tobacco use, alcohol abuse, or drug use.   FAMILY HISTORY:  Mother type 2 diabetes, ovarian cancer, breast cancer,  and coronary artery disease.  Father had type 2 diabetes and coronary  artery disease.  Siblings, hypertension, sister with possible sarcoma.  Son absent.   MEDICATIONS:  1. Gabapentin 600 mg t.i.d.  2. Simvastatin 20 mg daily.  3. Percocet 10/325 mg b.i.d.  4. Aciphex 20 mg daily.  5. Aspirin 81 mg daily.  6. Sertraline 25 mg daily.  7. Lisinopril 20 mg daily.  8. Metoprolol tartrate 25 mg daily.  9. Topamax 100 mg b.i.d.  10.Alprazolam 0.5 mg daily.  11.Promethazine 25 mg p.r.n. nausea or vomiting.  12.NovoLog SSI.  13.Lantus 18 units at bedtime.  14.Nitroglycerin 0.3 mg p.r.n. chest pain.   ALLERGIES:  CODEINE-nausea and vomiting and PENICILLIN-itching.   PHYSICAL EXAMINATION:  VITAL SIGNS:  Temperature 97.9, pulse 61,  respirations 24, blood pressure 161/70, and pulse ox 100%.  GENERAL:  Alert and oriented x3.  HEENT:  pupils equal, round, and reactive to light.  NECK:  No JVD.  CARDIOVASCULAR:  Regular rate and rhythm.  No murmurs, rubs, or gallops.  S1 and S2 present.  LUNGS:  Clear to auscultation bilaterally.  ABDOMEN:  Bowel sounds present.  Tenderness to left quadrant  bilaterally.  EXTREMITIES:  Without cyanosis, clubbing, or edema.  NEURO:  Cranial nerves 2 through 12 intact.   LABORATORY AND STUDIES:  Point of care, potassium 4.9, glucose 221,  hemoglobin 8.7, and hematocrit 25.6.  Point of care, cardiac enzymes,  troponin less than 0.05 and CK-MB is 1.1.  EKG was normal sinus rhythm  without peak T-waves.  D-dimer was within normal limits.   ASSESSMENT:  This is a 60 year old white female with chest pain.  1. Chest pain-unstable angina.  We will monitor every night-EKG in      morning-cardiac enzymes x3 q.8 h.-morphine IV p.r.n. chest pain-      nitrates p.r.n. chest pain-change aspirin to 325 mg daily.  2. Hyperkalemia.  Point of care, potassium is within normal limits-we      will draw BMET now-no T-waves on EKG.  3. Diabetes mellitus type 1-sliding-scale insulin moderate and Lantus      80 units at bedtime.  4. Hyperlipidemia check fasting lipid panel in the morning.  5. Gastroesophageal  reflux disease - on Aciphex.  6. Disposition-based on cardiac enzymes in a.m. EKG as well as BMET      potassium.      [name], M.D.  Electronically Signed      Latoya Ramp, MD  Electronically Signed    MJ/MEDQ  D:  08/03/2008  T:  08/04/2008  Job:  [id number]

## 2011-05-15 NOTE — Assessment & Plan Note (Signed)
[facility]                            CARDIOLOGY OFFICE NOTE   [name], [name]                   MRN:          [id number]  DATE:07/19/2008                            DOB:          1951-10-12    A 60 year old patient referred for enlarged heart and chest pain.   [name] is a poor historian.  She appears to have some kind of  psychological problem and memory deficits.  She is actually the daughter  of, [name], who I have cared for in the past who also has  significant psychiatric history.   The patient has known coronary artery disease.  She has previously been  seen by [facility].  Unfortunately, I do not have a lot of  records.  She appears to have had a angioplasty and stent to the  proximal circ in 2004.  She did not really have any other significant  disease.   She has not had close cardiology followup that I can tell since this  time.  She has not had a previous stress test since her stent.   She complaints of atypical chest pain.  The pain is under her left  breast, is not necessarily exertional.  It is sharp.  It can come and go  without necessarily being related to exertion.  There is no associated  diaphoresis or shortness of breath.  The pain has been going on  indefinitely for over a year.  She is also concerned about her brittle  diabetes.  She is scared to have a hypoglycemic reaction.  She has been  a diabetic for over 20 years as far as I can tell.  Her other coronary  risk factors include hypercholesterolemia and family history.  She is a  nonsmoker.   The patient is allergic to CODEINE and PENICILLIN.   MEDICATIONS:  Vitamin D, Spectravite, biotin, fish oil, gabapentin 300 a  day, topiramate 100 mg a day, sertraline 25 a day, metoprolol 25 a day,  simvastatin 20 a day, Aciphex 20 a day, an aspirin a day, Lantus 17  units bedtime, promethazine 25 bedtime, and NovoLog sliding scale.   The patient's  past medical history is remarkable for diabetes, history  of coronary artery disease, and history of depression.  She is allergic  to BISPHOSPHONATES, CODEINE, and PENICILLIN.   Past medical history is remarkable for question of a carotid stenosis  ill-defined, history of left lower extremity DVT, left upper lobe lung  nodule, history of MVP, history of stents to the circ in 2004, previous  recurrent UTIs, history of hypertension, hyperlipidemia, history of  fibromyalgia, depressive disorder, and anemia.   Patient is illiterate.  She lives by herself.  She is single.  Apparently, she has 2 children.  She does all activities of daily  living, but has no social life and really does not get out very much.  She says she is on disability due to her memory problems.   Family history is noncontributory, although her mother is known to [place],  [name], has significant psychiatric problems.   Her exam is remarkable for a  talkative white female who seems somewhat  frail with a halting voice. She is pale. Her blood pressure is 140/70,  pulse 67 and regular, respiratory rate 14, afebrile, and weight 129.  HEENT, unremarkable.  She has a right carotid bruit.  No  lymphadenopathy, thyromegaly, or JVP elevation.  Lungs are clear, good  diaphragmatic motion.  No wheezing.  S1 and S2 with normal heart sounds.  PMI normal.  Abdomen is benign.  Bowel sounds positive.  No AAA.  No  tenderness.  No bruit.  No hepatosplenomegaly.  No hepatojugular reflux.  Distal pulses are intact.  No edema.  Neuro, nonfocal.  Skin warm and  dry.  No muscular weakness.   EKG is normal.   IMPRESSION:  1. Chest pain.  Coronary artery disease, previous stent to the      circumflex.  Follow up stress Myoview.  Continue aspirin and beta-      blocker.  2. Right carotid bruit.  We will try to see if she has had any recent      carotid duplexes, if not we will follow up with this.  Continue      aspirin therapy.  No  transient ischemic attack symptoms.  3. Hypercholesterolemia in the setting of vascular disease, continue      simvastatin.  Lipid and liver profile per [facility].  4. Memory problems with psychiatric illness.  Continue topiramate and      sertraline.  I think she may benefit from a neurology consult if      this has not been done already.  5. Hypertension, currently well controlled.  Continue current dose of      metoprolol, low-salt diet.  6. Diabetes.  Follow up [facility].  Continue Lantus and      sliding scale.  Hemoglobin A1c quarterly.  7. Palor on exam:  We will check a Hb/Hct.   As long as her stress test is low-risk, she will be seen on an as-needed  basis.   Addendum:  The patient subsequently refused to have a myovue.  She said  that they have been normal in the past when she had blockages.  She  subsequently was set up to have a heart cath but then refused this.  Her  Hct on lab work came back at 23.  She will F/U with her primary care  doctors for further w/u of her anemia.     Noralyn Pick. Eden Emms, MD, [facility]  Electronically Signed    PCN/MedQ  DD: 07/19/2008  DT: 07/20/2008  Job #: [id number]   cc:   Johney Maine, M.D.

## 2011-06-27 ENCOUNTER — Other Ambulatory Visit: Payer: Self-pay | Admitting: Oncology

## 2011-06-27 ENCOUNTER — Encounter (HOSPITAL_BASED_OUTPATIENT_CLINIC_OR_DEPARTMENT_OTHER): Payer: Medicaid Other | Admitting: Oncology

## 2011-06-27 DIAGNOSIS — D472 Monoclonal gammopathy: Secondary | ICD-10-CM

## 2011-06-27 DIAGNOSIS — D649 Anemia, unspecified: Secondary | ICD-10-CM

## 2011-06-27 DIAGNOSIS — D638 Anemia in other chronic diseases classified elsewhere: Secondary | ICD-10-CM

## 2011-06-27 DIAGNOSIS — N289 Disorder of kidney and ureter, unspecified: Secondary | ICD-10-CM

## 2011-06-27 LAB — CBC WITH DIFFERENTIAL/PLATELET
BASO%: 0.3 % (ref 0.0–2.0)
Basophils Absolute: 0 10*3/uL (ref 0.0–0.1)
EOS%: 3.9 % (ref 0.0–7.0)
HGB: 8.5 g/dL — ABNORMAL LOW (ref 11.6–15.9)
MCH: 32.1 pg (ref 25.1–34.0)
MCHC: 34.3 g/dL (ref 31.5–36.0)
MCV: 93.5 fL (ref 79.5–101.0)
MONO%: 6.1 % (ref 0.0–14.0)
RDW: 12.6 % (ref 11.2–14.5)
lymph#: 1.2 10*3/uL (ref 0.9–3.3)

## 2011-07-25 ENCOUNTER — Encounter (HOSPITAL_BASED_OUTPATIENT_CLINIC_OR_DEPARTMENT_OTHER): Payer: Medicaid Other | Admitting: Oncology

## 2011-07-25 ENCOUNTER — Other Ambulatory Visit: Payer: Self-pay | Admitting: Oncology

## 2011-07-25 DIAGNOSIS — D472 Monoclonal gammopathy: Secondary | ICD-10-CM

## 2011-07-25 DIAGNOSIS — N289 Disorder of kidney and ureter, unspecified: Secondary | ICD-10-CM

## 2011-07-25 LAB — MORPHOLOGY: RBC Comments: NORMAL

## 2011-07-25 LAB — CBC WITH DIFFERENTIAL/PLATELET
Basophils Absolute: 0 10*3/uL (ref 0.0–0.1)
EOS%: 3.7 % (ref 0.0–7.0)
Eosinophils Absolute: 0.2 10*3/uL (ref 0.0–0.5)
HGB: 11.2 g/dL — ABNORMAL LOW (ref 11.6–15.9)
MCH: 32.7 pg (ref 25.1–34.0)
MCV: 96.5 fL (ref 79.5–101.0)
MONO%: 4.8 % (ref 0.0–14.0)
NEUT#: 2.6 10*3/uL (ref 1.5–6.5)
RBC: 3.42 10*6/uL — ABNORMAL LOW (ref 3.70–5.45)
RDW: 14.2 % (ref 11.2–14.5)
lymph#: 1.1 10*3/uL (ref 0.9–3.3)

## 2011-07-25 LAB — CHCC SMEAR

## 2011-07-27 LAB — PROTEIN ELECTROPHORESIS, SERUM
Albumin ELP: 64.1 % (ref 55.8–66.1)
Alpha-1-Globulin: 3.6 % (ref 2.9–4.9)
Alpha-2-Globulin: 9.1 % (ref 7.1–11.8)
M-Spike, %: 0.49 g/dL
Total Protein, Serum Electrophoresis: 6.3 g/dL (ref 6.0–8.3)

## 2011-07-27 LAB — COMPREHENSIVE METABOLIC PANEL
AST: 26 U/L (ref 0–37)
Alkaline Phosphatase: 153 U/L — ABNORMAL HIGH (ref 39–117)
BUN: 38 mg/dL — ABNORMAL HIGH (ref 6–23)
Glucose, Bld: 236 mg/dL — ABNORMAL HIGH (ref 70–99)
Total Bilirubin: 0.3 mg/dL (ref 0.3–1.2)

## 2011-07-27 LAB — KAPPA/LAMBDA LIGHT CHAINS: Kappa:Lambda Ratio: 1.91 — ABNORMAL HIGH (ref 0.26–1.65)

## 2011-07-27 LAB — FERRITIN: Ferritin: 436 ng/mL — ABNORMAL HIGH (ref 10–291)

## 2011-08-02 ENCOUNTER — Other Ambulatory Visit: Payer: Self-pay | Admitting: Obstetrics and Gynecology

## 2011-08-28 ENCOUNTER — Ambulatory Visit: Payer: Medicaid Other | Admitting: Physical Medicine & Rehabilitation

## 2011-09-26 ENCOUNTER — Encounter (HOSPITAL_BASED_OUTPATIENT_CLINIC_OR_DEPARTMENT_OTHER): Payer: Medicaid Other | Admitting: Oncology

## 2011-09-26 ENCOUNTER — Other Ambulatory Visit: Payer: Self-pay | Admitting: Oncology

## 2011-09-26 DIAGNOSIS — D649 Anemia, unspecified: Secondary | ICD-10-CM

## 2011-09-26 DIAGNOSIS — N289 Disorder of kidney and ureter, unspecified: Secondary | ICD-10-CM

## 2011-09-26 DIAGNOSIS — D638 Anemia in other chronic diseases classified elsewhere: Secondary | ICD-10-CM

## 2011-09-26 LAB — CBC WITH DIFFERENTIAL/PLATELET
BASO%: 0.4 % (ref 0.0–2.0)
Eosinophils Absolute: 0.2 10*3/uL (ref 0.0–0.5)
HCT: 25.9 % — ABNORMAL LOW (ref 34.8–46.6)
MCHC: 33.2 g/dL (ref 31.5–36.0)
MONO#: 0.3 10*3/uL (ref 0.1–0.9)
NEUT#: 3.2 10*3/uL (ref 1.5–6.5)
Platelets: 158 10*3/uL (ref 145–400)
RBC: 2.81 10*6/uL — ABNORMAL LOW (ref 3.70–5.45)
WBC: 5.4 10*3/uL (ref 3.9–10.3)
lymph#: 1.7 10*3/uL (ref 0.9–3.3)
nRBC: 0 % (ref 0–0)

## 2011-09-28 LAB — BASIC METABOLIC PANEL
BUN: 25 — ABNORMAL HIGH
CO2: 24
Calcium: 8.8
Calcium: 9
Creatinine, Ser: 1.08
Creatinine, Ser: 1.22 — ABNORMAL HIGH
GFR calc Af Amer: 55 — ABNORMAL LOW
GFR calc non Af Amer: 47 — ABNORMAL LOW
GFR calc non Af Amer: 52 — ABNORMAL LOW
Glucose, Bld: 68 — ABNORMAL LOW
Potassium: 4.3
Potassium: 4.9
Sodium: 143

## 2011-09-28 LAB — URINALYSIS, ROUTINE W REFLEX MICROSCOPIC
Glucose, UA: NEGATIVE
Ketones, ur: NEGATIVE
pH: 5.5

## 2011-09-28 LAB — CK TOTAL AND CKMB (NOT AT ARMC)
Relative Index: INVALID
Total CK: 83

## 2011-09-28 LAB — SAMPLE TO BLOOD BANK

## 2011-09-28 LAB — DIFFERENTIAL
Basophils Absolute: 0
Eosinophils Relative: 2
Lymphocytes Relative: 31
Lymphs Abs: 1.7
Monocytes Absolute: 0.3
Monocytes Relative: 6
Neutro Abs: 3.4

## 2011-09-28 LAB — POCT I-STAT, CHEM 8
BUN: 33 — ABNORMAL HIGH
Chloride: 118 — ABNORMAL HIGH
Glucose, Bld: 221 — ABNORMAL HIGH
HCT: 25 — ABNORMAL LOW
Potassium: 4.9

## 2011-09-28 LAB — POCT CARDIAC MARKERS
CKMB, poc: 1.4
Myoglobin, poc: 79.8
Troponin i, poc: <0.05

## 2011-09-28 LAB — URINE MICROSCOPIC-ADD ON

## 2011-09-28 LAB — LIPID PANEL
Cholesterol: 129
HDL: 41
LDL Cholesterol: 65
Total CHOL/HDL Ratio: 3.1
Triglycerides: 113

## 2011-09-28 LAB — CROSSMATCH
ABO/RH(D): O POS
Antibody Screen: NEGATIVE

## 2011-09-28 LAB — CBC
HCT: 25.6 — ABNORMAL LOW
HCT: 26.3 — ABNORMAL LOW
Hemoglobin: 8.7 — ABNORMAL LOW
Hemoglobin: 8.9 — ABNORMAL LOW
MCHC: 34
Platelets: 157
RBC: 2.72 — ABNORMAL LOW
RDW: 12.8
WBC: 3.7 — ABNORMAL LOW

## 2011-11-16 ENCOUNTER — Encounter: Payer: Self-pay | Admitting: *Deleted

## 2011-11-17 ENCOUNTER — Other Ambulatory Visit: Payer: Self-pay | Admitting: Oncology

## 2011-11-21 ENCOUNTER — Ambulatory Visit (HOSPITAL_BASED_OUTPATIENT_CLINIC_OR_DEPARTMENT_OTHER): Payer: Medicaid Other | Admitting: Oncology

## 2011-11-21 ENCOUNTER — Other Ambulatory Visit: Payer: Self-pay | Admitting: Oncology

## 2011-11-21 ENCOUNTER — Telehealth: Payer: Self-pay | Admitting: Oncology

## 2011-11-21 ENCOUNTER — Other Ambulatory Visit (HOSPITAL_BASED_OUTPATIENT_CLINIC_OR_DEPARTMENT_OTHER): Payer: Medicaid Other

## 2011-11-21 VITALS — BP 128/67 | HR 75 | Temp 96.9°F | Ht 63.0 in | Wt 120.9 lb

## 2011-11-21 DIAGNOSIS — D472 Monoclonal gammopathy: Secondary | ICD-10-CM

## 2011-11-21 DIAGNOSIS — E119 Type 2 diabetes mellitus without complications: Secondary | ICD-10-CM

## 2011-11-21 DIAGNOSIS — N039 Chronic nephritic syndrome with unspecified morphologic changes: Secondary | ICD-10-CM

## 2011-11-21 DIAGNOSIS — D649 Anemia, unspecified: Secondary | ICD-10-CM

## 2011-11-21 LAB — CBC WITH DIFFERENTIAL/PLATELET
Basophils Absolute: 0 10*3/uL (ref 0.0–0.1)
Eosinophils Absolute: 0.1 10*3/uL (ref 0.0–0.5)
HCT: 26.8 % — ABNORMAL LOW (ref 34.8–46.6)
HGB: 9.1 g/dL — ABNORMAL LOW (ref 11.6–15.9)
LYMPH%: 35.9 % (ref 14.0–49.7)
MCV: 94.4 fL (ref 79.5–101.0)
MONO#: 0.3 10*3/uL (ref 0.1–0.9)
MONO%: 6.7 % (ref 0.0–14.0)
NEUT#: 2.1 10*3/uL (ref 1.5–6.5)
NEUT%: 53.7 % (ref 38.4–76.8)
Platelets: 125 10*3/uL — ABNORMAL LOW (ref 145–400)
WBC: 3.9 10*3/uL (ref 3.9–10.3)

## 2011-11-21 LAB — COMPREHENSIVE METABOLIC PANEL
Alkaline Phosphatase: 129 U/L — ABNORMAL HIGH (ref 39–117)
BUN: 42 mg/dL — ABNORMAL HIGH (ref 6–23)
CO2: 22 mEq/L (ref 19–32)
Glucose, Bld: 71 mg/dL (ref 70–99)
Total Bilirubin: 0.1 mg/dL — ABNORMAL LOW (ref 0.3–1.2)
Total Protein: 6.6 g/dL (ref 6.0–8.3)

## 2011-11-21 LAB — FERRITIN: Ferritin: 492 ng/mL — ABNORMAL HIGH (ref 10–291)

## 2011-11-21 MED ORDER — DARBEPOETIN ALFA-POLYSORBATE 500 MCG/ML IJ SOLN
300.0000 ug | Freq: Once | INTRAMUSCULAR | Status: AC
  Administered 2011-11-21: 300 ug via SUBCUTANEOUS
  Filled 2011-11-21: qty 1

## 2011-11-21 NOTE — Telephone Encounter (Signed)
gve the pt's caregiver the dec -march 2013 appt calendar

## 2011-11-22 ENCOUNTER — Other Ambulatory Visit: Payer: Self-pay | Admitting: Oncology

## 2011-11-22 DIAGNOSIS — D472 Monoclonal gammopathy: Secondary | ICD-10-CM

## 2011-11-22 NOTE — Progress Notes (Signed)
[facility] OFFICE PROGRESS NOTE  Latoya Mannan, MD, MD  DIAGNOSES:   1.  Monoclonal gammopathy of undetermined significance (MGUS). 2.  Chronic normocytic anemia due to anemia of chronic kidney disease.   CURRENT TREATMENT:. 1.  MGUS:  Watchful observation. 2.  Anemia of chronic kidney disease:  Aranesp injection q4wk for Hgb <11.  INTERVAL HISTORY: [name] 60 y.o. female returns for regular follow up.  She is here with her sister.  She has chronic fatigue that has been going on for years.  The fatigue is mild which does not interfere with her activities of daily living.  She is still able to take care of herself.  She has occasional dizzy spell without syncope.  She has occasional mid sternal chest pain described as sharp lasting for few minutes, spontaneously resolved, not related to activities or eating.  The pain is midsternal, mild, not radiating, not associated with diaphoresis, nausea/vomiting, SOB, dizziness. She has occasional right leg and right arm spasm and diffuse arthralgia, worst in the morning, improving as the day progresses, not causing problem with ambulation.  She otherwise denies headache, visual changes, mucositis, nausea/vomiting, abdominal pain, bleeding symptoms, skin rash.    MEDICATIONS: Current Outpatient Prescriptions  Medication Sig Dispense Refill  . ALPRAZolam (XANAX) 1 MG tablet 1 tab by mouth 3 times a day as needed for anxiety  90 tablet  0  . aspirin 81 MG tablet Take 81 mg by mouth daily.        . Cholecalciferol (VITAMIN D-3 PO) Take 5,000 Int'l Units by mouth daily.        . clopidogrel (PLAVIX) 75 MG tablet Take 75 mg by mouth daily.        Marland Kitchen gabapentin (NEURONTIN) 300 MG capsule Take 300 mg by mouth 3 (three) times daily.        . hydrochlorothiazide (HYDRODIURIL) 12.5 MG tablet Take 12.5 mg by mouth daily.        . insulin aspart (NOVOLOG) 100 UNIT/ML injection Inject 15 Units into the skin 3 (three) times daily before meals.         . insulin glargine (LANTUS) 100 UNIT/ML injection Inject 15 Units into the skin every morning.  10 mL  0  . nitroGLYCERIN (NITROSTAT) 0.4 MG SL tablet Place 0.4 mg under the tongue every 5 (five) minutes as needed.        . promethazine (PHENERGAN) 25 MG tablet Take 1/2 to 1 tablet by mouth every six hours as needed for nausea  30 tablet  0  . RABEprazole (ACIPHEX) 20 MG tablet Take 20 mg by mouth daily.        . sertraline (ZOLOFT) 25 MG tablet Take 25 mg by mouth daily.        . simvastatin (ZOCOR) 20 MG tablet Take 20 mg by mouth at bedtime.        . topiramate (TOPAMAX) 100 MG tablet Take 1 tablet (100 mg total) by mouth 2 (two) times daily.  60 tablet  0  . traMADol (ULTRAM) 50 MG tablet Take 50 mg by mouth 4 (four) times daily as needed. Maximum dose= 8 tablets per day      . zolpidem (AMBIEN) 5 MG tablet Take 5 mg by mouth at bedtime as needed.         Current Facility-Administered Medications  Medication Dose Route Frequency Provider Last Rate Last Dose  . darbepoetin alfa-polysorbate (ARANESP) injection 300 mcg  300 mcg Subcutaneous Once Latoya Ruddell,  MD   300 mcg at 11/21/11 1614    ALLERGIES:  is allergic to bisphosphonates; codeine phosphate; hydralazine hcl; penicillins; and sulfa antibiotics.  REVIEW OF SYSTEMS:  The rest of the 14-point review of system was negative.   Filed Vitals:   11/21/11 1527  BP: 128/67  Pulse: 75  Temp: 96.9 F (36.1 C)   Wt Readings from Last 3 Encounters:  11/21/11 120 lb 14.4 oz (54.84 kg)  07/25/11 116 lb 3.2 oz (52.708 kg)  07/05/10 123 lb 4 oz (55.906 kg)   ECOG Performance status: 1  PHYSICAL EXAMINATION:   General:  Thin-appearing woman in no acute distress.  Eyes:  no scleral icterus.  ENT:  There were no oropharyngeal lesions.  Neck was without thyromegaly.  Lymphatics:  Negative cervical, supraclavicular or axillary adenopathy.  Respiratory: lungs were clear bilaterally without wheezing or crackles.  Cardiovascular:  Regular rate  and rhythm, S1/S2, without murmur, rub or gallop.  There was no pedal edema.  GI:  abdomen was soft, flat, nontender, nondistended, without organomegaly.  Muscoloskeletal:  no spinal tenderness of palpation of vertebral spine.  Skin exam was without echymosis, petichae.  Neuro exam was nonfocal.  Patient was able to get on and off exam table without assistance.  Gait was normal.  Patient was alerted and oriented.  Attention was good.   Language was appropriate.  Mood was normal without depression.  Speech was not pressured.  Thought content was not tangential.   LABORATORY/RADIOLOGY DATA:  Lab Results  Component Value Date   WBC 3.9 11/21/2011   HGB 9.1* 11/21/2011   HCT 26.8* 11/21/2011   PLT 125* 11/21/2011   GLUCOSE 71 11/21/2011   CHOL 145 12/28/2009   TRIG 148.0 12/28/2009   HDL 55.80 12/28/2009   LDLCALC 60 12/28/2009   ALT 22 11/21/2011   AST 20 11/21/2011   NA 141 11/21/2011   K 4.8 11/21/2011   CL 111 11/21/2011   CREATININE 1.31* 11/21/2011   BUN 42* 11/21/2011   CO2 22 11/21/2011   TSH 1.13 03/01/2010   INR 0.9 09/16/2009   HGBA1C 7.7* 12/28/2009    ASSESSMENT AND PLAN:  1.  Anemia:  Most likely due to anemia of chronic kidney disease.  Extensive work up in the past including a bone marrow biopsy was nonrevealing.  I again discussed with patient and her sister the risks and benefits of Aranesp.  It can improve Hgb which it had in the past for her to improve her stamina as she has chronic fatigue, and not requiring chronic blood transfusion.  However, it can be associated with a slight increased risk of vascular event.  Ms. [name] and her sister verbally expressed informed understanding and wised to continue with Aranesp.  Her Hgb is <11; and therefore she went ahead and received her injection today.   2.  Thrombocytopenia: chronic and stable.  Work up in the past was negative.  In the future, if she has significantly worsened pancytopenia with ANC <1, Hgb persistently <9; Plt  <100; then I may consider another diagnostic bone marrow biopsy.  She has no active bleeding, there is no indication for platelet transfusion.   Could also be med-induced with simvastatin and topiramate.  3.  MGUS:  Has been stable without sign of progression to active myeloma.  Cr/Ca stable.  Will continue to monitor her SPEP and free light chain q4-6 months.  4.  Hyperlipidemia: on simvastatin per PCP.  5.  HTN:  She is on HCTZ  per PCP.  6.  Depression: on Sertraline per PCP with stable mood.  7.  Anxiety: on Xanax per PCP prn.  8.  Type II diabetes mellitus: on insulin per PCP.  9.  Chronic renal insufficiency:  Most likely due to HTN, and DM.  Stable Cr.  10.  Intermittent chest pain:  Not typical of cardiac origin; however, given her risk factors, I advised to f/u with her PCP to see if a stress test is indicated.  9.  Follow up:  Monthly CBC and Aranesp injection for Hgb <11.  I'll see her in 4 months with CBC, CMET, SPEP, serum free light chain.

## 2011-11-23 ENCOUNTER — Other Ambulatory Visit: Payer: Self-pay | Admitting: Oncology

## 2011-11-23 ENCOUNTER — Encounter: Payer: Self-pay | Admitting: Oncology

## 2011-12-18 ENCOUNTER — Telehealth: Payer: Self-pay | Admitting: Oncology

## 2011-12-18 NOTE — Telephone Encounter (Signed)
Pt called to cancel her appts

## 2011-12-19 ENCOUNTER — Ambulatory Visit: Payer: Medicaid Other

## 2011-12-19 ENCOUNTER — Other Ambulatory Visit: Payer: Medicaid Other | Admitting: Lab

## 2012-01-16 ENCOUNTER — Other Ambulatory Visit (HOSPITAL_BASED_OUTPATIENT_CLINIC_OR_DEPARTMENT_OTHER): Payer: Medicaid Other | Admitting: Lab

## 2012-01-16 ENCOUNTER — Ambulatory Visit (HOSPITAL_BASED_OUTPATIENT_CLINIC_OR_DEPARTMENT_OTHER): Payer: Medicaid Other

## 2012-01-16 VITALS — BP 141/67 | HR 78 | Temp 97.5°F

## 2012-01-16 DIAGNOSIS — D649 Anemia, unspecified: Secondary | ICD-10-CM

## 2012-01-16 LAB — CBC WITH DIFFERENTIAL/PLATELET
BASO%: 0.2 % (ref 0.0–2.0)
EOS%: 3 % (ref 0.0–7.0)
LYMPH%: 31.6 % (ref 14.0–49.7)
MCH: 31.2 pg (ref 25.1–34.0)
MCHC: 33.3 g/dL (ref 31.5–36.0)
MCV: 93.7 fL (ref 79.5–101.0)
MONO%: 5.4 % (ref 0.0–14.0)
Platelets: 127 10*3/uL — ABNORMAL LOW (ref 145–400)
RBC: 3.14 10*6/uL — ABNORMAL LOW (ref 3.70–5.45)

## 2012-01-16 MED ORDER — DARBEPOETIN ALFA-POLYSORBATE 500 MCG/ML IJ SOLN
300.0000 ug | Freq: Once | INTRAMUSCULAR | Status: AC
  Administered 2012-01-16: 300 ug via SUBCUTANEOUS
  Filled 2012-01-16: qty 1

## 2012-02-13 ENCOUNTER — Other Ambulatory Visit: Payer: Medicaid Other | Admitting: Lab

## 2012-02-13 ENCOUNTER — Ambulatory Visit: Payer: Medicaid Other

## 2012-03-12 ENCOUNTER — Ambulatory Visit (HOSPITAL_BASED_OUTPATIENT_CLINIC_OR_DEPARTMENT_OTHER): Payer: Medicaid Other | Admitting: Oncology

## 2012-03-12 ENCOUNTER — Telehealth: Payer: Self-pay | Admitting: Oncology

## 2012-03-12 ENCOUNTER — Other Ambulatory Visit (HOSPITAL_BASED_OUTPATIENT_CLINIC_OR_DEPARTMENT_OTHER): Payer: Medicaid Other | Admitting: Lab

## 2012-03-12 VITALS — BP 113/53 | HR 87 | Temp 97.0°F | Ht 63.0 in | Wt 117.8 lb

## 2012-03-12 DIAGNOSIS — D649 Anemia, unspecified: Secondary | ICD-10-CM

## 2012-03-12 DIAGNOSIS — D696 Thrombocytopenia, unspecified: Secondary | ICD-10-CM

## 2012-03-12 DIAGNOSIS — D472 Monoclonal gammopathy: Secondary | ICD-10-CM

## 2012-03-12 DIAGNOSIS — E785 Hyperlipidemia, unspecified: Secondary | ICD-10-CM

## 2012-03-12 LAB — CBC WITH DIFFERENTIAL/PLATELET
BASO%: 0.3 % (ref 0.0–2.0)
EOS%: 1.4 % (ref 0.0–7.0)
HCT: 29.9 % — ABNORMAL LOW (ref 34.8–46.6)
MCH: 32.3 pg (ref 25.1–34.0)
MCHC: 34 g/dL (ref 31.5–36.0)
NEUT%: 65.5 % (ref 38.4–76.8)
RDW: 13.2 % (ref 11.2–14.5)
lymph#: 1.5 10*3/uL (ref 0.9–3.3)

## 2012-03-12 LAB — COMPREHENSIVE METABOLIC PANEL
ALT: 16 U/L (ref 0–35)
AST: 20 U/L (ref 0–37)
Calcium: 9 mg/dL (ref 8.4–10.5)
Chloride: 106 mEq/L (ref 96–112)
Creatinine, Ser: 1.48 mg/dL — ABNORMAL HIGH (ref 0.50–1.10)
Total Bilirubin: 0.2 mg/dL — ABNORMAL LOW (ref 0.3–1.2)

## 2012-03-12 NOTE — Progress Notes (Signed)
[facility] OFFICE PROGRESS NOTE  DIAGNOSES:   1.  Monoclonal gammopathy of undetermined significance (MGUS). 2.  Chronic normocytic anemia due to anemia of chronic kidney disease.   CURRENT TREATMENT:. 1.  MGUS:  Watchful observation. 2.  Anemia of chronic kidney disease:  Aranesp injection q4wk for Hgb <11.  INTERVAL HISTORY: RANIAH KARAN 61 y.o. female returns for regular follow up with her sister.  She still feels depressed.  She does not think that the Zoloft if working for her.  She had psychotherapy in the past, and did not like it.  She denies feeling hopelessness, suicidal/homocidal ideation.  She denies all visible bleeding.  She has mild fatigue, but she is independent of activities of daily living.  She has chronic lower back pain without lower extremity weakness, paresthesia, bowel/bladder incontinence.  At time, she has butterfly skin rash on the face with sun exposure.  She was told in the past that she may have lupus.  However, she has not seen a rheumatologist.   Patient denies headache, visual changes, confusion, drenching night sweats, palpable lymph node swelling, mucositis, odynophagia, dysphagia, nausea vomiting, jaundice, chest pain, palpitation, shortness of breath, dyspnea on exertion, productive cough, gum bleeding, epistaxis, hematemesis, hemoptysis, abdominal pain, abdominal swelling, early satiety, melena, hematochezia, hematuria, spontaneous bleeding, joint swelling, joint pain, heat or cold intolerance, bowel bladder incontinence, back pain, focal motor weakness, paresthesia, depression, suicidal or homocidal ideation, feeling hopelessness.   MEDICATIONS: Current Outpatient Prescriptions  Medication Sig Dispense Refill  . ALPRAZolam (XANAX) 1 MG tablet 1 tab by mouth 3 times a day as needed for anxiety  90 tablet  0  . aspirin 81 MG tablet Take 81 mg by mouth daily.        . Cholecalciferol (VITAMIN D-3 PO) Take 5,000 Int'l Units by mouth  daily.        . clopidogrel (PLAVIX) 75 MG tablet Take 75 mg by mouth daily.        Marland Kitchen gabapentin (NEURONTIN) 300 MG capsule Take 300 mg by mouth 3 (three) times daily.        . hydrochlorothiazide (HYDRODIURIL) 12.5 MG tablet Take 12.5 mg by mouth daily.        . insulin aspart (NOVOLOG) 100 UNIT/ML injection Inject 15 Units into the skin 3 (three) times daily before meals.        . insulin glargine (LANTUS) 100 UNIT/ML injection Inject 15 Units into the skin every morning.  10 mL  0  . nitroGLYCERIN (NITROSTAT) 0.4 MG SL tablet Place 0.4 mg under the tongue every 5 (five) minutes as needed.        . promethazine (PHENERGAN) 25 MG tablet Take 1/2 to 1 tablet by mouth every six hours as needed for nausea  30 tablet  0  . RABEprazole (ACIPHEX) 20 MG tablet Take 20 mg by mouth daily.        . sertraline (ZOLOFT) 25 MG tablet Take 25 mg by mouth daily.        . simvastatin (ZOCOR) 20 MG tablet Take 20 mg by mouth at bedtime.        . topiramate (TOPAMAX) 100 MG tablet Take 1 tablet (100 mg total) by mouth 2 (two) times daily.  60 tablet  0  . traMADol (ULTRAM) 50 MG tablet Take 50 mg by mouth 4 (four) times daily as needed. Maximum dose= 8 tablets per day      . zolpidem (AMBIEN) 5 MG tablet Take 5 mg  by mouth at bedtime as needed.          ALLERGIES:  is allergic to bisphosphonates; codeine phosphate; hydralazine hcl; penicillins; and sulfa antibiotics.  REVIEW OF SYSTEMS:  The rest of the 14-point review of system was negative.   Filed Vitals:   03/12/12 1511  BP: 113/53  Pulse: 87  Temp: 97 F (36.1 C)   Wt Readings from Last 3 Encounters:  03/12/12 117 lb 12.8 oz (53.434 kg)  11/21/11 120 lb 14.4 oz (54.84 kg)  07/25/11 116 lb 3.2 oz (52.708 kg)   ECOG Performance status: 1  PHYSICAL EXAMINATION:   General:  Thin-appearing woman in no acute distress.  Eyes:  no scleral icterus.  ENT:  There were no oropharyngeal lesions.  Neck was without thyromegaly.  Lymphatics:  Negative  cervical, supraclavicular or axillary adenopathy.  Respiratory: lungs were clear bilaterally without wheezing or crackles.  Cardiovascular:  Regular rate and rhythm, S1/S2, without murmur, rub or gallop.  There was no pedal edema.  GI:  abdomen was soft, flat, nontender, nondistended, without organomegaly.  Muscoloskeletal:  no spinal tenderness of palpation of vertebral spine.  Skin exam was without echymosis, petichae.  Neuro exam was nonfocal.  Patient was able to get on and off exam table without assistance.  Gait was normal.  Patient was alerted and oriented.  Attention was good.   Language was appropriate.  Mood was normal without depression.  Speech was not pressured.  Thought content was not tangential.   LABORATORY/RADIOLOGY DATA:  Lab Results  Component Value Date   WBC 5.4 03/12/2012   HGB 10.2* 03/12/2012   HCT 29.9* 03/12/2012   PLT 137* 03/12/2012   GLUCOSE 321* 03/12/2012   CHOL 145 12/28/2009   TRIG 148.0 12/28/2009   HDL 55.80 12/28/2009   LDLCALC 60 12/28/2009   ALT 16 03/12/2012   AST 20 03/12/2012   NA 139 03/12/2012   K 4.9 03/12/2012   CL 106 03/12/2012   CREATININE 1.48* 03/12/2012   BUN 35* 03/12/2012   CO2 24 03/12/2012   TSH 1.13 03/01/2010   INR 0.9 09/16/2009   HGBA1C 7.7* 12/28/2009    ASSESSMENT AND PLAN:  1.  Anemia:  Most likely due to anemia of chronic kidney disease.  Extensive work up in the past including a bone marrow biopsy was nonrevealing.  I again discussed with patient and her sister the risks and benefits of Aranesp. She has been receiving Arenesp with slight improvement of her Hgb.  She has not side effects from Aranesp.  Will continue with this treatment.    2.  Thrombocytopenia: chronic and stable.  Work up in the past was negative.  In the future, if she has significantly worsened pancytopenia with ANC <1, Hgb persistently <9; Plt <100; then I may consider another diagnostic bone marrow biopsy.  She has no active bleeding, there is no indication for  platelet transfusion.   Could also be med-induced with simvastatin and topiramate.  3.  MGUS:  Has been stable without sign of progression to active myeloma.  Her CMET, M-spike are pending today.  If she has worsening M-spike or renal insufficiency or hypercalcemia, then I may repeat bone marrow biopsy.  4.  Hyperlipidemia: on simvastatin per PCP.  5.  HTN:  She is on HCTZ per PCP.  6.  Depression: on Sertraline per PCP.  I advised her to see another psychiatrist or therapist as her mood is still depressed.  7.  Anxiety: on Xanax per PCP  prn.  8.  Type II diabetes mellitus: on insulin per PCP.  9.  Chronic renal insufficiency:  Most likely due to HTN, and DM.  Stable Cr.  10.  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  11.   Follow up:  Monthly CBC and Aranesp injection for Hgb <11.  She again will have SPEP and light chain check in 4 months.   I'll see her in 8 months with CBC, CMET, SPEP, serum free light chain.

## 2012-03-12 NOTE — Telephone Encounter (Signed)
gv pt appt for [id number]

## 2012-03-14 ENCOUNTER — Telehealth: Payer: Self-pay | Admitting: Oncology

## 2012-03-14 LAB — PROTEIN ELECTROPHORESIS, SERUM
Albumin ELP: 62.9 % (ref 55.8–66.1)
Alpha-1-Globulin: 4 % (ref 2.9–4.9)
Alpha-2-Globulin: 10.5 % (ref 7.1–11.8)
Total Protein, Serum Electrophoresis: 6.3 g/dL (ref 6.0–8.3)

## 2012-03-14 LAB — KAPPA/LAMBDA LIGHT CHAINS
Kappa:Lambda Ratio: 4.5 — ABNORMAL HIGH (ref 0.26–1.65)
Lambda Free Lght Chn: 1.1 mg/dL (ref 0.57–2.63)

## 2012-03-14 LAB — IRON AND TIBC: %SAT: 72 % — ABNORMAL HIGH (ref 20–55)

## 2012-03-14 LAB — FERRITIN: Ferritin: 472 ng/mL — ABNORMAL HIGH (ref 10–291)

## 2012-03-14 NOTE — Telephone Encounter (Signed)
called pt with appts and mailed them out to  her   aom

## 2012-04-02 ENCOUNTER — Other Ambulatory Visit: Payer: Self-pay | Admitting: *Deleted

## 2012-04-09 ENCOUNTER — Other Ambulatory Visit: Payer: Self-pay | Admitting: *Deleted

## 2012-04-09 ENCOUNTER — Other Ambulatory Visit: Payer: Medicaid Other | Admitting: Lab

## 2012-04-09 ENCOUNTER — Ambulatory Visit: Payer: Medicaid Other

## 2012-04-14 ENCOUNTER — Other Ambulatory Visit: Payer: Self-pay | Admitting: Oncology

## 2012-04-14 ENCOUNTER — Telehealth: Payer: Self-pay | Admitting: Oncology

## 2012-04-14 NOTE — Telephone Encounter (Signed)
called pts home 4-12 and 4-15 no answer no vm will try again to contact pt with appts for 04/17

## 2012-04-15 ENCOUNTER — Telehealth: Payer: Self-pay | Admitting: Oncology

## 2012-04-15 NOTE — Telephone Encounter (Signed)
pt called and was concern about her monthly appts confirmed with md that she needs lab and shots monthly.  informed pt of appts on 04/17 and to pick up schedule at that time

## 2012-04-16 ENCOUNTER — Other Ambulatory Visit: Payer: Medicaid Other

## 2012-04-16 ENCOUNTER — Other Ambulatory Visit (HOSPITAL_BASED_OUTPATIENT_CLINIC_OR_DEPARTMENT_OTHER): Payer: Medicaid Other | Admitting: Lab

## 2012-04-16 ENCOUNTER — Ambulatory Visit: Payer: Medicaid Other

## 2012-04-16 ENCOUNTER — Ambulatory Visit (HOSPITAL_BASED_OUTPATIENT_CLINIC_OR_DEPARTMENT_OTHER): Payer: Medicaid Other

## 2012-04-16 VITALS — BP 186/83 | HR 76 | Temp 98.3°F

## 2012-04-16 DIAGNOSIS — D649 Anemia, unspecified: Secondary | ICD-10-CM

## 2012-04-16 DIAGNOSIS — D638 Anemia in other chronic diseases classified elsewhere: Secondary | ICD-10-CM

## 2012-04-16 DIAGNOSIS — N189 Chronic kidney disease, unspecified: Secondary | ICD-10-CM

## 2012-04-16 LAB — CBC WITH DIFFERENTIAL/PLATELET
Basophils Absolute: 0 10*3/uL (ref 0.0–0.1)
EOS%: 2.1 % (ref 0.0–7.0)
Eosinophils Absolute: 0.1 10*3/uL (ref 0.0–0.5)
HCT: 25.9 % — ABNORMAL LOW (ref 34.8–46.6)
HGB: 8.5 g/dL — ABNORMAL LOW (ref 11.6–15.9)
MCH: 32.1 pg (ref 25.1–34.0)
MCV: 97.9 fL (ref 79.5–101.0)
NEUT#: 2.9 10*3/uL (ref 1.5–6.5)
NEUT%: 63.5 % (ref 38.4–76.8)
RDW: 13.3 % (ref 11.2–14.5)
lymph#: 1.3 10*3/uL (ref 0.9–3.3)

## 2012-04-16 MED ORDER — DARBEPOETIN ALFA-POLYSORBATE 300 MCG/0.6ML IJ SOLN
300.0000 ug | Freq: Once | INTRAMUSCULAR | Status: AC
  Administered 2012-04-16: 300 ug via SUBCUTANEOUS
  Filled 2012-04-16: qty 0.6

## 2012-04-16 NOTE — Patient Instructions (Signed)
Call MD for increase shortness of breath or excessive fatigue

## 2012-05-07 ENCOUNTER — Other Ambulatory Visit (HOSPITAL_BASED_OUTPATIENT_CLINIC_OR_DEPARTMENT_OTHER): Payer: Medicaid Other | Admitting: Lab

## 2012-05-07 ENCOUNTER — Other Ambulatory Visit: Payer: Medicaid Other | Admitting: Lab

## 2012-05-07 ENCOUNTER — Ambulatory Visit (HOSPITAL_BASED_OUTPATIENT_CLINIC_OR_DEPARTMENT_OTHER): Payer: Medicaid Other

## 2012-05-07 ENCOUNTER — Ambulatory Visit: Payer: Medicaid Other

## 2012-05-07 VITALS — BP 151/75 | HR 72 | Temp 97.0°F

## 2012-05-07 DIAGNOSIS — D649 Anemia, unspecified: Secondary | ICD-10-CM

## 2012-05-07 LAB — CBC WITH DIFFERENTIAL/PLATELET
BASO%: 0.3 % (ref 0.0–2.0)
EOS%: 2.3 % (ref 0.0–7.0)
LYMPH%: 40.5 % (ref 14.0–49.7)
MCH: 31.8 pg (ref 25.1–34.0)
MCHC: 32.4 g/dL (ref 31.5–36.0)
MONO#: 0.2 10*3/uL (ref 0.1–0.9)
Platelets: 165 10*3/uL (ref 145–400)
RBC: 2.99 10*6/uL — ABNORMAL LOW (ref 3.70–5.45)
WBC: 3.5 10*3/uL — ABNORMAL LOW (ref 3.9–10.3)

## 2012-05-07 MED ORDER — DARBEPOETIN ALFA-POLYSORBATE 300 MCG/0.6ML IJ SOLN
300.0000 ug | Freq: Once | INTRAMUSCULAR | Status: AC
  Administered 2012-05-07: 300 ug via SUBCUTANEOUS
  Filled 2012-05-07: qty 1

## 2012-05-07 NOTE — Patient Instructions (Signed)
Call MD for further problems 

## 2012-05-07 NOTE — Progress Notes (Signed)
05/07/12 Aranesp given today for HGB 9.5.  Patient last dose of Aranesp was 3 weeks ago but scheduled today.  Dr. Gaylyn Rong reviewed lab results from today and ok for patient to receive the Aranesp today and patient aware next appointment is 06/04/12 (one month).    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  Stated that her mother has had several episodes of melanoma.  Patient instructed  to contact a  dermatologist of her choice.

## 2012-06-04 ENCOUNTER — Other Ambulatory Visit: Payer: Medicaid Other | Admitting: Lab

## 2012-06-04 ENCOUNTER — Ambulatory Visit: Payer: Medicaid Other

## 2012-07-02 ENCOUNTER — Other Ambulatory Visit: Payer: Medicaid Other | Admitting: Lab

## 2012-07-02 ENCOUNTER — Ambulatory Visit: Payer: Medicaid Other

## 2012-07-02 ENCOUNTER — Other Ambulatory Visit: Payer: Self-pay

## 2012-07-04 ENCOUNTER — Telehealth: Payer: Self-pay | Admitting: Oncology

## 2012-07-04 NOTE — Telephone Encounter (Signed)
Added inj for after lb appt on 7/11 per 7/3 pof. Not able to reach pt re add on or lm. Start has not changed.

## 2012-07-10 ENCOUNTER — Ambulatory Visit: Payer: Medicaid Other

## 2012-07-10 ENCOUNTER — Other Ambulatory Visit: Payer: Medicaid Other | Admitting: Lab

## 2012-07-10 ENCOUNTER — Telehealth: Payer: Self-pay | Admitting: Oncology

## 2012-07-10 NOTE — Telephone Encounter (Signed)
pt called to cx 7/11 lab and inj and does not wish to r/s at this time as she has another appt on 7/31  aom

## 2012-07-30 ENCOUNTER — Other Ambulatory Visit: Payer: Medicaid Other | Admitting: Lab

## 2012-07-30 ENCOUNTER — Other Ambulatory Visit (HOSPITAL_BASED_OUTPATIENT_CLINIC_OR_DEPARTMENT_OTHER): Payer: Medicaid Other | Admitting: Lab

## 2012-07-30 ENCOUNTER — Ambulatory Visit: Payer: Medicaid Other

## 2012-07-30 ENCOUNTER — Ambulatory Visit (HOSPITAL_BASED_OUTPATIENT_CLINIC_OR_DEPARTMENT_OTHER): Payer: Medicaid Other

## 2012-07-30 VITALS — BP 176/85 | HR 80 | Temp 97.4°F

## 2012-07-30 DIAGNOSIS — D649 Anemia, unspecified: Secondary | ICD-10-CM

## 2012-07-30 LAB — CBC WITH DIFFERENTIAL/PLATELET
BASO%: 0.3 % (ref 0.0–2.0)
EOS%: 2.9 % (ref 0.0–7.0)
HCT: 28.6 % — ABNORMAL LOW (ref 34.8–46.6)
MCHC: 32.5 g/dL (ref 31.5–36.0)
MONO#: 0.3 10*3/uL (ref 0.1–0.9)
RBC: 3.06 10*6/uL — ABNORMAL LOW (ref 3.70–5.45)
RDW: 12.9 % (ref 11.2–14.5)
WBC: 3.8 10*3/uL — ABNORMAL LOW (ref 3.9–10.3)
lymph#: 1.3 10*3/uL (ref 0.9–3.3)
nRBC: 0 % (ref 0–0)

## 2012-07-30 MED ORDER — DARBEPOETIN ALFA-POLYSORBATE 300 MCG/0.6ML IJ SOLN
300.0000 ug | Freq: Once | INTRAMUSCULAR | Status: AC
  Administered 2012-07-30: 300 ug via SUBCUTANEOUS
  Filled 2012-07-30: qty 1

## 2012-08-01 ENCOUNTER — Other Ambulatory Visit: Payer: Self-pay | Admitting: *Deleted

## 2012-08-27 ENCOUNTER — Other Ambulatory Visit: Payer: Medicaid Other | Admitting: Lab

## 2012-08-27 ENCOUNTER — Telehealth: Payer: Self-pay | Admitting: *Deleted

## 2012-08-27 ENCOUNTER — Ambulatory Visit: Payer: Medicaid Other

## 2012-08-27 NOTE — Telephone Encounter (Signed)
Called pt home about [place].  No answer and no answering machine available.  Will make Dr Gaylyn Rong aware.

## 2012-09-17 ENCOUNTER — Telehealth: Payer: Self-pay | Admitting: Oncology

## 2012-09-17 NOTE — Telephone Encounter (Signed)
called pt just kept ringing..no vm available...mailed appt time schedule change...sed

## 2012-09-23 ENCOUNTER — Telehealth: Payer: Self-pay | Admitting: Oncology

## 2012-09-23 NOTE — Telephone Encounter (Signed)
Returned call to dtr in law Jacki Cones re message she left stating she needed to get pt in ASAP for her inj. lmonvm for Jacki Cones informing her that pt has appt tomorrow (9/25) also left other appts for 10/23 and 11/11. lmonvm also for desk nurse as dtr in law mentioned pt was not feeling week

## 2012-09-24 ENCOUNTER — Ambulatory Visit: Payer: Medicaid Other

## 2012-09-24 ENCOUNTER — Other Ambulatory Visit: Payer: Medicaid Other | Admitting: Lab

## 2012-09-24 ENCOUNTER — Ambulatory Visit (HOSPITAL_BASED_OUTPATIENT_CLINIC_OR_DEPARTMENT_OTHER): Payer: Medicaid Other

## 2012-09-24 ENCOUNTER — Other Ambulatory Visit (HOSPITAL_BASED_OUTPATIENT_CLINIC_OR_DEPARTMENT_OTHER): Payer: Medicaid Other

## 2012-09-24 VITALS — BP 148/67 | HR 71 | Temp 98.4°F

## 2012-09-24 DIAGNOSIS — D649 Anemia, unspecified: Secondary | ICD-10-CM

## 2012-09-24 LAB — CBC WITH DIFFERENTIAL/PLATELET
BASO%: 0.2 % (ref 0.0–2.0)
Basophils Absolute: 0 10*3/uL (ref 0.0–0.1)
EOS%: 2.5 % (ref 0.0–7.0)
Eosinophils Absolute: 0.1 10*3/uL (ref 0.0–0.5)
HCT: 26.2 % — ABNORMAL LOW (ref 34.8–46.6)
HGB: 8.7 g/dL — ABNORMAL LOW (ref 11.6–15.9)
LYMPH%: 36.2 % (ref 14.0–49.7)
MCH: 31.3 pg (ref 25.1–34.0)
MCHC: 33.2 g/dL (ref 31.5–36.0)
MCV: 94.2 fL (ref 79.5–101.0)
MONO#: 0.2 10*3/uL (ref 0.1–0.9)
MONO%: 5.4 % (ref 0.0–14.0)
NEUT#: 2.5 10*3/uL (ref 1.5–6.5)
NEUT%: 55.7 % (ref 38.4–76.8)
Platelets: 130 10*3/uL — ABNORMAL LOW (ref 145–400)
RBC: 2.78 10*6/uL — ABNORMAL LOW (ref 3.70–5.45)
RDW: 13.2 % (ref 11.2–14.5)
WBC: 4.5 10*3/uL (ref 3.9–10.3)
lymph#: 1.6 10*3/uL (ref 0.9–3.3)
nRBC: 0 % (ref 0–0)

## 2012-09-24 MED ORDER — DARBEPOETIN ALFA-POLYSORBATE 500 MCG/ML IJ SOLN
300.0000 ug | Freq: Once | INTRAMUSCULAR | Status: AC
  Administered 2012-09-24: 300 ug via SUBCUTANEOUS
  Filled 2012-09-24: qty 1

## 2012-09-25 ENCOUNTER — Telehealth: Payer: Self-pay | Admitting: *Deleted

## 2012-09-25 NOTE — Telephone Encounter (Signed)
Patient's daughter-n-law, [name] called asking if the aranesp injection yesterday causes diarrhea or if we have a nora virus present.  Spoke with patient who repors drinking coffee here before injection which caused stomach to churn, draw and make noise before the injection was given.  After injection received she had loose stool and soiled herself.  Reports two stools yesterday and five brown watery stools today.  Denies use of laxatives, stool softeners or any diet changes.  Asked to avoid fried, spicy foods.  Has imodium on hand to use if needed.

## 2012-10-01 ENCOUNTER — Other Ambulatory Visit: Payer: Self-pay

## 2012-10-01 NOTE — Telephone Encounter (Signed)
Message copied by Kallie Locks on Wed Oct 01, 2012  9:56 AM ------      Message from: HA, Raliegh Ip T      Created: Mon Sep 29, 2012  3:26 PM      Regarding: RE: "Flu Shot"      Contact: [phone]       She can get it anytime.  Either now with her PCP, local pharmacy, or at her next injeciton appiontment.  Thanks.             ----- Message -----         From: Marcell Barlow, RN         Sent: 09/29/2012   3:08 PM           To: Exie Parody, MD      Subject: "Flu Shot"                                               Okay for patient to have influenza vaccine here? Patient currently has an injection appointment for 10/22/12, but she doesn't see you until 11/13.            Thanks.

## 2012-10-22 ENCOUNTER — Other Ambulatory Visit: Payer: Self-pay | Admitting: Oncology

## 2012-10-22 ENCOUNTER — Other Ambulatory Visit (HOSPITAL_BASED_OUTPATIENT_CLINIC_OR_DEPARTMENT_OTHER): Payer: Medicaid Other

## 2012-10-22 ENCOUNTER — Other Ambulatory Visit: Payer: Medicaid Other | Admitting: Lab

## 2012-10-22 ENCOUNTER — Ambulatory Visit: Payer: Medicaid Other

## 2012-10-22 ENCOUNTER — Ambulatory Visit (HOSPITAL_BASED_OUTPATIENT_CLINIC_OR_DEPARTMENT_OTHER): Payer: Medicaid Other

## 2012-10-22 VITALS — BP 135/64 | HR 69 | Temp 97.2°F

## 2012-10-22 DIAGNOSIS — Z23 Encounter for immunization: Secondary | ICD-10-CM

## 2012-10-22 DIAGNOSIS — D472 Monoclonal gammopathy: Secondary | ICD-10-CM

## 2012-10-22 DIAGNOSIS — D649 Anemia, unspecified: Secondary | ICD-10-CM

## 2012-10-22 LAB — CBC WITH DIFFERENTIAL/PLATELET
BASO%: 0.5 % (ref 0.0–2.0)
EOS%: 1.9 % (ref 0.0–7.0)
MCH: 32.5 pg (ref 25.1–34.0)
MCHC: 32.6 g/dL (ref 31.5–36.0)
RDW: 14.2 % (ref 11.2–14.5)
lymph#: 1.4 10*3/uL (ref 0.9–3.3)

## 2012-10-22 LAB — COMPREHENSIVE METABOLIC PANEL
Alkaline Phosphatase: 117 U/L (ref 39–117)
Glucose, Bld: 301 mg/dL — ABNORMAL HIGH (ref 70–99)
Sodium: 140 mEq/L (ref 135–145)
Total Bilirubin: 0.1 mg/dL — ABNORMAL LOW (ref 0.3–1.2)
Total Protein: 6.6 g/dL (ref 6.0–8.3)

## 2012-10-22 MED ORDER — INFLUENZA VIRUS VACC SPLIT PF IM SUSP: 0.5000 mL | Freq: Once | INTRAMUSCULAR | Status: DC

## 2012-10-22 MED ORDER — DARBEPOETIN ALFA-POLYSORBATE 300 MCG/0.6ML IJ SOLN
300.0000 ug | Freq: Once | INTRAMUSCULAR | Status: AC
  Administered 2012-10-22: 300 ug via SUBCUTANEOUS
  Filled 2012-10-22: qty 0.6

## 2012-10-22 MED ORDER — INFLUENZA VIRUS VACC SPLIT PF IM SUSP
0.5000 mL | Freq: Once | INTRAMUSCULAR | Status: AC
  Administered 2012-10-22: 0.5 mL via INTRAMUSCULAR
  Filled 2012-10-22: qty 0.5

## 2012-10-22 NOTE — Addendum Note (Signed)
Addended by: Neita Goodnight on: 10/22/2012 04:48 PM   Modules accepted: Orders, Medications

## 2012-10-24 LAB — PROTEIN ELECTROPHORESIS, SERUM
Albumin ELP: 64.9 % (ref 55.8–66.1)
Alpha-1-Globulin: 3.5 % (ref 2.9–4.9)
Total Protein, Serum Electrophoresis: 6.4 g/dL (ref 6.0–8.3)

## 2012-10-24 LAB — KAPPA/LAMBDA LIGHT CHAINS
Kappa free light chain: 2.57 mg/dL — ABNORMAL HIGH (ref 0.33–1.94)
Kappa:Lambda Ratio: 2.11 — ABNORMAL HIGH (ref 0.26–1.65)

## 2012-11-06 ENCOUNTER — Telehealth: Payer: Self-pay | Admitting: Oncology

## 2012-11-06 NOTE — Telephone Encounter (Signed)
returned pt's call and rescheduled pt's appt....s/w the pt...pt aware

## 2012-11-10 ENCOUNTER — Ambulatory Visit: Payer: Medicaid Other | Admitting: Oncology

## 2012-11-10 ENCOUNTER — Other Ambulatory Visit: Payer: Medicaid Other | Admitting: Lab

## 2012-11-12 ENCOUNTER — Ambulatory Visit: Payer: Medicaid Other | Admitting: Oncology

## 2012-11-12 ENCOUNTER — Other Ambulatory Visit: Payer: Medicaid Other | Admitting: Lab

## 2012-11-17 ENCOUNTER — Other Ambulatory Visit: Payer: Self-pay | Admitting: Lab

## 2012-11-19 ENCOUNTER — Ambulatory Visit (HOSPITAL_BASED_OUTPATIENT_CLINIC_OR_DEPARTMENT_OTHER): Payer: Medicaid Other | Admitting: Oncology

## 2012-11-19 ENCOUNTER — Other Ambulatory Visit (HOSPITAL_BASED_OUTPATIENT_CLINIC_OR_DEPARTMENT_OTHER): Payer: Medicaid Other | Admitting: Lab

## 2012-11-19 ENCOUNTER — Telehealth: Payer: Self-pay | Admitting: Oncology

## 2012-11-19 VITALS — BP 138/66 | HR 72 | Temp 96.9°F | Resp 20 | Ht 63.0 in | Wt 121.1 lb

## 2012-11-19 DIAGNOSIS — Z1231 Encounter for screening mammogram for malignant neoplasm of breast: Secondary | ICD-10-CM

## 2012-11-19 DIAGNOSIS — D696 Thrombocytopenia, unspecified: Secondary | ICD-10-CM

## 2012-11-19 DIAGNOSIS — D649 Anemia, unspecified: Secondary | ICD-10-CM

## 2012-11-19 DIAGNOSIS — D472 Monoclonal gammopathy: Secondary | ICD-10-CM

## 2012-11-19 DIAGNOSIS — N189 Chronic kidney disease, unspecified: Secondary | ICD-10-CM

## 2012-11-19 LAB — CBC WITH DIFFERENTIAL/PLATELET
Basophils Absolute: 0 10*3/uL (ref 0.0–0.1)
Eosinophils Absolute: 0.2 10*3/uL (ref 0.0–0.5)
HCT: 40.6 % (ref 34.8–46.6)
HGB: 13 g/dL (ref 11.6–15.9)
LYMPH%: 41.5 % (ref 14.0–49.7)
MCV: 97.4 fL (ref 79.5–101.0)
MONO#: 0.3 10*3/uL (ref 0.1–0.9)
MONO%: 7 % (ref 0.0–14.0)
NEUT#: 2.1 10*3/uL (ref 1.5–6.5)
NEUT%: 47.4 % (ref 38.4–76.8)
Platelets: 122 10*3/uL — ABNORMAL LOW (ref 145–400)
WBC: 4.4 10*3/uL (ref 3.9–10.3)

## 2012-11-19 NOTE — Telephone Encounter (Signed)
Gave pt appt for lab and injection until next September 2014, see ML on May 2014 with labs and injection

## 2012-11-19 NOTE — Patient Instructions (Addendum)
1.  Issues:   *  Chronic anemia:  Response with Aranesp injection every 4 weeks for Hemoglobin <11.  *  [name]:  No evidence of progression into active myeloma.  Continue observation for this.

## 2012-11-20 NOTE — Progress Notes (Signed)
[facility] OFFICE PROGRESS NOTE  DIAGNOSES:   1.  Monoclonal gammopathy of undetermined significance (MGUS). 2.  Chronic normocytic anemia due to anemia of chronic kidney disease.   CURRENT TREATMENT:. 1.  MGUS:  Watchful observation. 2.  Anemia of chronic kidney disease:  Aranesp injection q4wk for Hgb <11.  INTERVAL HISTORY: [name] 61 y.o. female returns for regular follow up with her daughter.  She complained of intermittent scalp irritation.  She denied new shampoo.  She used warm and not hot water to rinse her hair.  She reports chronic mild fatigue; however, she is still able to live by herself and is independent of all activities of daily living.  Patient denies fever, anorexia, weight loss, headache, visual changes, confusion, drenching night sweats, palpable lymph node swelling, mucositis, odynophagia, dysphagia, nausea vomiting, jaundice, chest pain, palpitation, shortness of breath, dyspnea on exertion, productive cough, gum bleeding, epistaxis, hematemesis, hemoptysis, abdominal pain, abdominal swelling, early satiety, melena, hematochezia, hematuria, skin rash, spontaneous bleeding, joint swelling, joint pain, heat or cold intolerance, bowel bladder incontinence, back pain, focal motor weakness, paresthesia, depression, suicidal or homicidal ideation, feeling hopelessness.    MEDICATIONS: Current Outpatient Prescriptions  Medication Sig Dispense Refill  . ALPRAZolam (XANAX) 1 MG tablet 1 tab by mouth 3 times a day as needed for anxiety  90 tablet  0  . aspirin 81 MG tablet Take 81 mg by mouth daily.        . Cholecalciferol (VITAMIN D-3 PO) Take 5,000 Int'l Units by mouth daily.        . clopidogrel (PLAVIX) 75 MG tablet Take 75 mg by mouth daily.        Marland Kitchen gabapentin (NEURONTIN) 300 MG capsule Take 300 mg by mouth 3 (three) times daily.        . hydrochlorothiazide (HYDRODIURIL) 12.5 MG tablet Take 12.5 mg by mouth daily.        . insulin aspart  (NOVOLOG) 100 UNIT/ML injection Inject 15 Units into the skin 3 (three) times daily before meals.        . nitroGLYCERIN (NITROSTAT) 0.4 MG SL tablet Place 0.4 mg under the tongue every 5 (five) minutes as needed.        . promethazine (PHENERGAN) 25 MG tablet Take 1/2 to 1 tablet by mouth every six hours as needed for nausea  30 tablet  0  . RABEprazole (ACIPHEX) 20 MG tablet Take 20 mg by mouth daily.        . sertraline (ZOLOFT) 25 MG tablet Take 25 mg by mouth daily.        . simvastatin (ZOCOR) 20 MG tablet Take 20 mg by mouth at bedtime.        . traMADol (ULTRAM) 50 MG tablet Take 50 mg by mouth 4 (four) times daily as needed. Maximum dose= 8 tablets per day      . zolpidem (AMBIEN) 5 MG tablet Take 5 mg by mouth at bedtime as needed.        . insulin glargine (LANTUS) 100 UNIT/ML injection Inject 15 Units into the skin every morning.  10 mL  0    ALLERGIES:  is allergic to bisphosphonates; codeine phosphate; hydralazine hcl; penicillins; and sulfa antibiotics.  REVIEW OF SYSTEMS:  The rest of the 14-point review of system was negative.   Filed Vitals:   11/19/12 1502  BP: 138/66  Pulse: 72  Temp: 96.9 F (36.1 C)  Resp: 20   Wt Readings from Last 3  Encounters:  11/19/12 121 lb 1.6 oz (54.931 kg)  03/12/12 117 lb 12.8 oz (53.434 kg)  11/21/11 120 lb 14.4 oz (54.84 kg)   ECOG Performance status: 1  PHYSICAL EXAMINATION:   General:  Thin-appearing woman in no acute distress.  Eyes:  no scleral icterus.  ENT:  There were no oropharyngeal lesions.  Neck was without thyromegaly.  Lymphatics:  Negative cervical, supraclavicular or axillary adenopathy.  Respiratory: lungs were clear bilaterally without wheezing or crackles.  Cardiovascular:  Regular rate and rhythm, S1/S2, without murmur, rub or gallop.  There was no pedal edema.  GI:  abdomen was soft, flat, nontender, nondistended, without organomegaly.  Muscoloskeletal:  no spinal tenderness of palpation of vertebral spine.  Skin  exam was without echymosis, petichae.  Neuro exam was nonfocal.  Patient was able to get on and off exam table without assistance.  Gait was normal.  Patient was alerted and oriented.  Attention was good.   Language was appropriate.  Mood was normal without depression.  Speech was not pressured.  Thought content was not tangential.   LABORATORY/RADIOLOGY DATA:  Lab Results  Component Value Date   WBC 4.4 11/19/2012   HGB 13.0 11/19/2012   HCT 40.6 11/19/2012   PLT 122* 11/19/2012   GLUCOSE 301* 10/22/2012   CHOL 145 12/28/2009   TRIG 148.0 12/28/2009   HDL 55.80 12/28/2009   LDLCALC 60 12/28/2009   ALT 11 10/22/2012   AST 15 10/22/2012   NA 140 10/22/2012   K 4.6 10/22/2012   CL 109 10/22/2012   CREATININE 1.31* 10/22/2012   BUN 39* 10/22/2012   CO2 22 10/22/2012   TSH 1.13 03/01/2010   INR 0.9 09/16/2009   HGBA1C 7.7* 12/28/2009    ASSESSMENT AND PLAN:  1.  Anemia:  Most likely due to anemia of chronic kidney disease.  Extensive work up in the past including a bone marrow biopsy was nonrevealing.  I again discussed with patient and her daughter the risks and benefits of Aranesp. She has been receiving Arenesp on a routine basis the past few months without missing appointment.  He now has normalization of her Hgb.  She has not side effects from Aranesp.  She and her daughter were aware of the slight increased risk of thrombosis with Aranesp.  They elected to continue with this treatment.    2.  Thrombocytopenia: chronic and stable.  Work up in the past was negative.  In the future, if she has significantly worsened pancytopenia, then I may consider repeating bone marrow biopsy.  3.  MGUS:  Has been stable without sign of progression to active myeloma.  She does have CKD that is most likely due to DM, HTN.  If she has worsening M-spike or renal insufficiency or hypercalcemia, then I may repeat bone marrow biopsy.  4.  Hyperlipidemia: on simvastatin per PCP.  5.  HTN:  She is on HCTZ per  PCP.  6.  Depression: on Sertraline per PCP.  I advised her to see another psychiatrist or therapist as her mood is still depressed.  7.  Anxiety: on Xanax per PCP prn.  8.  Type II diabetes mellitus: on insulin per PCP.  9.  Chronic renal insufficiency:  Most likely due to HTN, and DM.  Stable Cr.  10.   Follow up:  Monthly CBC and Aranesp injection for Hgb <11.

## 2012-12-16 ENCOUNTER — Other Ambulatory Visit: Payer: Self-pay | Admitting: Oncology

## 2012-12-16 DIAGNOSIS — D649 Anemia, unspecified: Secondary | ICD-10-CM

## 2012-12-17 ENCOUNTER — Ambulatory Visit: Payer: Medicaid Other

## 2012-12-17 ENCOUNTER — Other Ambulatory Visit: Payer: Medicaid Other | Admitting: Lab

## 2012-12-17 ENCOUNTER — Other Ambulatory Visit: Payer: Self-pay | Admitting: Oncology

## 2013-01-14 ENCOUNTER — Ambulatory Visit: Payer: Medicaid Other

## 2013-01-14 ENCOUNTER — Other Ambulatory Visit: Payer: Medicaid Other | Admitting: Lab

## 2013-02-09 ENCOUNTER — Telehealth: Payer: Self-pay | Admitting: Oncology

## 2013-02-09 ENCOUNTER — Telehealth: Payer: Self-pay | Admitting: *Deleted

## 2013-02-09 NOTE — Telephone Encounter (Signed)
Vm received from pt's sister on Firday 2/07 to r/s pt's appts.  It appears it has already been done.

## 2013-02-09 NOTE — Telephone Encounter (Signed)
pt needed to r/s appt due to weather.....Done °

## 2013-02-11 ENCOUNTER — Other Ambulatory Visit: Payer: Medicaid Other | Admitting: Lab

## 2013-02-11 ENCOUNTER — Ambulatory Visit: Payer: Medicaid Other

## 2013-02-16 ENCOUNTER — Other Ambulatory Visit: Payer: Medicaid Other | Admitting: Lab

## 2013-02-16 ENCOUNTER — Ambulatory Visit: Payer: Medicaid Other

## 2013-02-17 ENCOUNTER — Telehealth: Payer: Self-pay | Admitting: Oncology

## 2013-02-17 NOTE — Telephone Encounter (Signed)
pt called to r/s missed appt....Done °

## 2013-02-20 ENCOUNTER — Telehealth: Payer: Self-pay | Admitting: Oncology

## 2013-02-20 NOTE — Telephone Encounter (Signed)
r.s appt to Tuesday per daughter in law

## 2013-02-23 ENCOUNTER — Other Ambulatory Visit: Payer: Medicaid Other | Admitting: Lab

## 2013-02-23 ENCOUNTER — Ambulatory Visit: Payer: Medicaid Other

## 2013-02-24 ENCOUNTER — Ambulatory Visit (HOSPITAL_BASED_OUTPATIENT_CLINIC_OR_DEPARTMENT_OTHER): Payer: Medicaid Other

## 2013-02-24 ENCOUNTER — Other Ambulatory Visit (HOSPITAL_BASED_OUTPATIENT_CLINIC_OR_DEPARTMENT_OTHER): Payer: Medicaid Other | Admitting: Lab

## 2013-02-24 ENCOUNTER — Telehealth: Payer: Self-pay | Admitting: Oncology

## 2013-02-24 VITALS — BP 164/75 | HR 88 | Temp 97.2°F

## 2013-02-24 DIAGNOSIS — N189 Chronic kidney disease, unspecified: Secondary | ICD-10-CM

## 2013-02-24 DIAGNOSIS — D649 Anemia, unspecified: Secondary | ICD-10-CM

## 2013-02-24 DIAGNOSIS — D631 Anemia in chronic kidney disease: Secondary | ICD-10-CM

## 2013-02-24 LAB — CBC WITH DIFFERENTIAL/PLATELET
BASO%: 0.2 % (ref 0.0–2.0)
HCT: 24.6 % — ABNORMAL LOW (ref 34.8–46.6)
LYMPH%: 17.6 % (ref 14.0–49.7)
MCH: 32.8 pg (ref 25.1–34.0)
MCHC: 33.9 g/dL (ref 31.5–36.0)
MCV: 96.8 fL (ref 79.5–101.0)
MONO#: 0.3 10*3/uL (ref 0.1–0.9)
MONO%: 6.4 % (ref 0.0–14.0)
NEUT%: 73.7 % (ref 38.4–76.8)
Platelets: 148 10*3/uL (ref 145–400)
RBC: 2.54 10*6/uL — ABNORMAL LOW (ref 3.70–5.45)
WBC: 4.6 10*3/uL (ref 3.9–10.3)

## 2013-02-24 MED ORDER — DARBEPOETIN ALFA-POLYSORBATE 300 MCG/0.6ML IJ SOLN
300.0000 ug | Freq: Once | INTRAMUSCULAR | Status: AC
  Administered 2013-02-24: 300 ug via SUBCUTANEOUS
  Filled 2013-02-24: qty 0.6

## 2013-02-24 NOTE — Telephone Encounter (Signed)
, °

## 2013-03-11 ENCOUNTER — Other Ambulatory Visit: Payer: Medicaid Other | Admitting: Lab

## 2013-03-11 ENCOUNTER — Ambulatory Visit: Payer: Medicaid Other

## 2013-03-24 ENCOUNTER — Other Ambulatory Visit: Payer: Medicaid Other | Admitting: Lab

## 2013-03-24 ENCOUNTER — Ambulatory Visit: Payer: Medicaid Other

## 2013-04-08 ENCOUNTER — Ambulatory Visit: Payer: Medicaid Other

## 2013-04-08 ENCOUNTER — Other Ambulatory Visit: Payer: Medicaid Other | Admitting: Lab

## 2013-04-21 ENCOUNTER — Ambulatory Visit: Payer: Medicaid Other

## 2013-04-21 ENCOUNTER — Telehealth: Payer: Self-pay | Admitting: Oncology

## 2013-04-21 ENCOUNTER — Other Ambulatory Visit: Payer: Medicaid Other | Admitting: Lab

## 2013-05-06 ENCOUNTER — Ambulatory Visit: Payer: Medicaid Other

## 2013-05-06 ENCOUNTER — Other Ambulatory Visit: Payer: Medicaid Other | Admitting: Lab

## 2013-05-06 ENCOUNTER — Ambulatory Visit: Payer: Medicaid Other | Admitting: Oncology

## 2013-05-19 ENCOUNTER — Other Ambulatory Visit: Payer: Medicaid Other | Admitting: Lab

## 2013-05-19 ENCOUNTER — Ambulatory Visit: Payer: Medicaid Other

## 2013-05-19 ENCOUNTER — Ambulatory Visit: Payer: Medicaid Other | Admitting: Oncology

## 2013-06-03 ENCOUNTER — Other Ambulatory Visit: Payer: Medicaid Other | Admitting: Lab

## 2013-06-03 ENCOUNTER — Ambulatory Visit: Payer: Medicaid Other

## 2013-06-16 ENCOUNTER — Ambulatory Visit: Payer: Medicaid Other

## 2013-06-16 ENCOUNTER — Other Ambulatory Visit: Payer: Medicaid Other | Admitting: Lab

## 2013-06-16 ENCOUNTER — Telehealth: Payer: Self-pay | Admitting: *Deleted

## 2013-06-16 NOTE — Telephone Encounter (Signed)
Pt left 2 long voicemails requesting letter from Dr. Gaylyn Rong to Medicaid stating it is a "life and death situation" for her to get Aranesp.  She states she has $10,000 bill from [place] for Aranesp that is not being covered by Medicaid.  She says Medicaid requests a letter from Dr. Gaylyn Rong to pay for the Aranesp.  States she cannot keep her Aranesp appts because she can't afford to pay for this.   Message given to Raquel in Financial Counseling office to investigate.

## 2013-07-01 ENCOUNTER — Ambulatory Visit: Payer: Medicaid Other

## 2013-07-01 ENCOUNTER — Other Ambulatory Visit: Payer: Medicaid Other | Admitting: Lab

## 2013-07-14 ENCOUNTER — Ambulatory Visit (HOSPITAL_BASED_OUTPATIENT_CLINIC_OR_DEPARTMENT_OTHER): Payer: Medicaid Other

## 2013-07-14 ENCOUNTER — Ambulatory Visit: Payer: Medicaid Other | Admitting: Lab

## 2013-07-14 ENCOUNTER — Other Ambulatory Visit: Payer: Self-pay | Admitting: Oncology

## 2013-07-14 ENCOUNTER — Other Ambulatory Visit: Payer: Medicaid Other | Admitting: Lab

## 2013-07-14 ENCOUNTER — Ambulatory Visit: Payer: Medicaid Other

## 2013-07-14 ENCOUNTER — Telehealth: Payer: Self-pay | Admitting: Oncology

## 2013-07-14 ENCOUNTER — Encounter (HOSPITAL_COMMUNITY)
Admission: RE | Admit: 2013-07-14 | Discharge: 2013-07-14 | Disposition: A | Discharge status: Home / Self Care | Payer: Medicaid Other | Source: Ambulatory Visit | Attending: Oncology | Admitting: Oncology

## 2013-07-14 ENCOUNTER — Other Ambulatory Visit (HOSPITAL_BASED_OUTPATIENT_CLINIC_OR_DEPARTMENT_OTHER): Payer: Medicaid Other | Admitting: Lab

## 2013-07-14 ENCOUNTER — Other Ambulatory Visit: Payer: Self-pay | Admitting: *Deleted

## 2013-07-14 VITALS — BP 135/79 | HR 74 | Temp 97.5°F

## 2013-07-14 DIAGNOSIS — D649 Anemia, unspecified: Secondary | ICD-10-CM

## 2013-07-14 DIAGNOSIS — D631 Anemia in chronic kidney disease: Secondary | ICD-10-CM

## 2013-07-14 DIAGNOSIS — N189 Chronic kidney disease, unspecified: Secondary | ICD-10-CM

## 2013-07-14 LAB — CBC WITH DIFFERENTIAL/PLATELET
BASO%: 0.2 % (ref 0.0–2.0)
EOS%: 4.3 % (ref 0.0–7.0)
HCT: 21 % — ABNORMAL LOW (ref 34.8–46.6)
LYMPH%: 29 % (ref 14.0–49.7)
MCH: 31.3 pg (ref 25.1–34.0)
MCHC: 32.4 g/dL (ref 31.5–36.0)
MCV: 96.8 fL (ref 79.5–101.0)
MONO%: 6.9 % (ref 0.0–14.0)
NEUT%: 59.6 % (ref 38.4–76.8)
Platelets: 202 10*3/uL (ref 145–400)
RBC: 2.17 10*6/uL — ABNORMAL LOW (ref 3.70–5.45)
WBC: 4.6 10*3/uL (ref 3.9–10.3)
nRBC: 0 % (ref 0–0)

## 2013-07-14 MED ORDER — DARBEPOETIN ALFA-POLYSORBATE 500 MCG/ML IJ SOLN
300.0000 ug | Freq: Once | INTRAMUSCULAR | Status: AC
  Administered 2013-07-14: 300 ug via SUBCUTANEOUS
  Filled 2013-07-14: qty 1

## 2013-07-14 NOTE — Telephone Encounter (Signed)
gv and printed appt sched and avs forlpt. sent pt back to lab

## 2013-07-16 ENCOUNTER — Ambulatory Visit (HOSPITAL_BASED_OUTPATIENT_CLINIC_OR_DEPARTMENT_OTHER): Payer: Medicaid Other

## 2013-07-16 VITALS — BP 185/82 | HR 77 | Temp 98.2°F | Resp 18

## 2013-07-16 DIAGNOSIS — D649 Anemia, unspecified: Secondary | ICD-10-CM

## 2013-07-16 MED ORDER — ACETAMINOPHEN 325 MG PO TABS
650.0000 mg | ORAL_TABLET | Freq: Once | ORAL | Status: AC
  Administered 2013-07-16: 650 mg via ORAL

## 2013-07-16 MED ORDER — SODIUM CHLORIDE 0.9 % IV SOLN
250.0000 mL | Freq: Once | INTRAVENOUS | Status: AC
  Administered 2013-07-16: 250 mL via INTRAVENOUS

## 2013-07-16 MED ORDER — DIPHENHYDRAMINE HCL 25 MG PO CAPS
25.0000 mg | ORAL_CAPSULE | Freq: Once | ORAL | Status: AC
  Administered 2013-07-16: 25 mg via ORAL

## 2013-07-16 NOTE — Progress Notes (Signed)
1750 Post 2nd unit of blood BP 200/80 P-76 then ten minutes later BP 185/82 P-77 Pt reports no chest pain.  Reported VS to Dr. Truett Perna, OK to discharge pt per MD.  Instructed pt to take BP meds tonight.  Pt verbalized understanding and states "this happens sometimes"

## 2013-07-16 NOTE — Patient Instructions (Signed)
Blood Transfusion Information WHAT IS A BLOOD TRANSFUSION? A transfusion is the replacement of blood or some of its parts. Blood is made up of multiple cells which provide different functions.  Red blood cells carry oxygen and are used for blood loss replacement.  White blood cells fight against infection.  Platelets control bleeding.  Plasma helps clot blood.  Other blood products are available for specialized needs, such as hemophilia or other clotting disorders. BEFORE THE TRANSFUSION  Who gives blood for transfusions?   You may be able to donate blood to be used at a later date on yourself (autologous donation).  Relatives can be asked to donate blood. This is generally not any safer than if you have received blood from a stranger. The same precautions are taken to ensure safety when a relative's blood is donated.  Healthy volunteers who are fully evaluated to make sure their blood is safe. This is blood bank blood. Transfusion therapy is the safest it has ever been in the practice of medicine. Before blood is taken from a donor, a complete history is taken to make sure that person has no history of diseases nor engages in risky social behavior (examples are intravenous drug use or sexual activity with multiple partners). The donor's travel history is screened to minimize risk of transmitting infections, such as malaria. The donated blood is tested for signs of infectious diseases, such as HIV and hepatitis. The blood is then tested to be sure it is compatible with you in order to minimize the chance of a transfusion reaction. If you or a relative donates blood, this is often done in anticipation of surgery and is not appropriate for emergency situations. It takes many days to process the donated blood. RISKS AND COMPLICATIONS Although transfusion therapy is very safe and saves many lives, the main dangers of transfusion include:   Getting an infectious disease.  Developing a  transfusion reaction. This is an allergic reaction to something in the blood you were given. Every precaution is taken to prevent this. The decision to have a blood transfusion has been considered carefully by your caregiver before blood is given. Blood is not given unless the benefits outweigh the risks. AFTER THE TRANSFUSION  Right after receiving a blood transfusion, you will usually feel much better and more energetic. This is especially true if your red blood cells have gotten low (anemic). The transfusion raises the level of the red blood cells which carry oxygen, and this usually causes an energy increase.  The nurse administering the transfusion will monitor you carefully for complications. HOME CARE INSTRUCTIONS  No special instructions are needed after a transfusion. You may find your energy is better. Speak with your caregiver about any limitations on activity for underlying diseases you may have. SEEK MEDICAL CARE IF:   Your condition is not improving after your transfusion.  You develop redness or irritation at the intravenous (IV) site. SEEK IMMEDIATE MEDICAL CARE IF:  Any of the following symptoms occur over the next 12 hours:  Shaking chills.  You have a temperature by mouth above 102 F (38.9 C), not controlled by medicine.  Chest, back, or muscle pain.  People around you feel you are not acting correctly or are confused.  Shortness of breath or difficulty breathing.  Dizziness and fainting.  You get a rash or develop hives.  You have a decrease in urine output.  Your urine turns a dark color or changes to pink, red, or brown. Any of the following   symptoms occur over the next 10 days:  You have a temperature by mouth above 102 F (38.9 C), not controlled by medicine.  Shortness of breath.  Weakness after normal activity.  The white part of the eye turns yellow (jaundice).  You have a decrease in the amount of urine or are urinating less often.  Your  urine turns a dark color or changes to pink, red, or brown. Document Released: 12/14/2000 Document Revised: 03/10/2012 Document Reviewed: 08/02/2008 Exit[employer] Patient Information 2014 [employer].  

## 2013-07-17 LAB — TYPE AND SCREEN
ABO/RH(D): O POS
Unit division: 0

## 2013-07-23 ENCOUNTER — Telehealth: Payer: Self-pay | Admitting: *Deleted

## 2013-07-23 NOTE — Telephone Encounter (Signed)
Pt s/w Triage RN earlier today (see note).  She c/o tightness and warmth in right leg and feeling tired.   Pt confirmed these complaints.  Instructed pt to contact PCP or go to Urgent Care or ED if needed to r/o blood clot in her leg.  She says she has a call into her PCP and waiting for return call.  Instructed pt to call again if needed.  Advised that she got aranesp and transfusion here last week for the anemia and has appt here next month to have her anemia re checked.  Instructed pt to keep appts here as scheduled so we can continue to manage her anemia.  She verbalized understanding.

## 2013-07-23 NOTE — Telephone Encounter (Signed)
PT.COMPLAINS OF FEELING WEAK AND WANTS TO SLEEP ALL THE TIME. ALSO SHE COMPLAINS OF CRAMPING AND PAIN IN HER RIGHT CALF. PT. IS HAVING DIFFICULTY WALKING DUE TO THE PAIN. SHE STATES THE RIGHT LEG IS TIGHTER AND WARMER THAN THE LEFT LEG. PT. DOES NOT NOTICE ANY REDNESS OF THE RIGHT LEG. SHE IS SCHEDULED FOR A COLONOSCOPYSCOPY ON 08/05/13. PT. IS AFRAID SHE MIGHT HAVE A BLOOD CLOT OR "BLEED TO DEATH" DUE TO HER ANEMIA. THIS NOTE WAS ROUTED AND GIVEN TO CAMEO PORTER,RN.

## 2013-07-29 ENCOUNTER — Ambulatory Visit: Payer: Medicaid Other

## 2013-07-29 ENCOUNTER — Other Ambulatory Visit: Payer: Medicaid Other | Admitting: Lab

## 2013-08-11 ENCOUNTER — Other Ambulatory Visit (HOSPITAL_BASED_OUTPATIENT_CLINIC_OR_DEPARTMENT_OTHER): Payer: Medicaid Other | Admitting: Lab

## 2013-08-11 ENCOUNTER — Ambulatory Visit: Payer: Medicaid Other

## 2013-08-11 DIAGNOSIS — D649 Anemia, unspecified: Secondary | ICD-10-CM

## 2013-08-11 LAB — CBC WITH DIFFERENTIAL/PLATELET
Basophils Absolute: 0 10*3/uL (ref 0.0–0.1)
Eosinophils Absolute: 0.2 10*3/uL (ref 0.0–0.5)
HCT: 38.4 % (ref 34.8–46.6)
HGB: 12.5 g/dL (ref 11.6–15.9)
MCV: 96.7 fL (ref 79.5–101.0)
MONO%: 5.2 % (ref 0.0–14.0)
NEUT#: 2.2 10*3/uL (ref 1.5–6.5)
NEUT%: 52.4 % (ref 38.4–76.8)
RDW: 13.6 % (ref 11.2–14.5)
lymph#: 1.5 10*3/uL (ref 0.9–3.3)

## 2013-08-11 MED ORDER — DARBEPOETIN ALFA-POLYSORBATE 500 MCG/ML IJ SOLN: 300.0000 ug | Freq: Once | INTRAMUSCULAR | Status: DC

## 2013-08-26 ENCOUNTER — Other Ambulatory Visit: Payer: Medicaid Other | Admitting: Lab

## 2013-08-26 ENCOUNTER — Ambulatory Visit: Payer: Medicaid Other

## 2013-09-07 ENCOUNTER — Other Ambulatory Visit: Payer: Self-pay | Admitting: Hematology and Oncology

## 2013-09-07 DIAGNOSIS — D649 Anemia, unspecified: Secondary | ICD-10-CM

## 2013-09-08 ENCOUNTER — Telehealth: Payer: Self-pay | Admitting: *Deleted

## 2013-09-08 ENCOUNTER — Ambulatory Visit: Payer: Medicaid Other

## 2013-09-08 ENCOUNTER — Other Ambulatory Visit: Payer: Medicaid Other | Admitting: Lab

## 2013-09-08 NOTE — Telephone Encounter (Signed)
Called patient about missed appointment.  No answer at home phone.

## 2013-09-23 ENCOUNTER — Other Ambulatory Visit: Payer: Medicaid Other | Admitting: Lab

## 2013-09-23 ENCOUNTER — Ambulatory Visit: Payer: Medicaid Other

## 2013-10-05 ENCOUNTER — Other Ambulatory Visit: Payer: Self-pay | Admitting: *Deleted

## 2013-10-05 DIAGNOSIS — D649 Anemia, unspecified: Secondary | ICD-10-CM

## 2013-10-05 DIAGNOSIS — D472 Monoclonal gammopathy: Secondary | ICD-10-CM

## 2013-10-06 ENCOUNTER — Ambulatory Visit (HOSPITAL_BASED_OUTPATIENT_CLINIC_OR_DEPARTMENT_OTHER): Payer: Medicaid Other

## 2013-10-06 ENCOUNTER — Other Ambulatory Visit (HOSPITAL_BASED_OUTPATIENT_CLINIC_OR_DEPARTMENT_OTHER): Payer: Medicaid Other | Admitting: Lab

## 2013-10-06 VITALS — BP 141/45 | HR 90 | Temp 98.3°F

## 2013-10-06 DIAGNOSIS — D649 Anemia, unspecified: Secondary | ICD-10-CM

## 2013-10-06 DIAGNOSIS — D631 Anemia in chronic kidney disease: Secondary | ICD-10-CM

## 2013-10-06 DIAGNOSIS — D472 Monoclonal gammopathy: Secondary | ICD-10-CM

## 2013-10-06 DIAGNOSIS — N189 Chronic kidney disease, unspecified: Secondary | ICD-10-CM

## 2013-10-06 LAB — CBC WITH DIFFERENTIAL/PLATELET
Eosinophils Absolute: 0.1 10*3/uL (ref 0.0–0.5)
HCT: 27.5 % — ABNORMAL LOW (ref 34.8–46.6)
LYMPH%: 27 % (ref 14.0–49.7)
MCV: 90.8 fL (ref 79.5–101.0)
MONO#: 0.3 10*3/uL (ref 0.1–0.9)
MONO%: 5.6 % (ref 0.0–14.0)
NEUT#: 3.2 10*3/uL (ref 1.5–6.5)
NEUT%: 64.9 % (ref 38.4–76.8)
Platelets: 187 10*3/uL (ref 145–400)
WBC: 4.9 10*3/uL (ref 3.9–10.3)

## 2013-10-06 MED ORDER — DARBEPOETIN ALFA-POLYSORBATE 300 MCG/0.6ML IJ SOLN
300.0000 ug | Freq: Once | INTRAMUSCULAR | Status: AC
  Administered 2013-10-06: 300 ug via SUBCUTANEOUS
  Filled 2013-10-06: qty 0.6

## 2013-11-02 ENCOUNTER — Other Ambulatory Visit: Payer: Self-pay | Admitting: Hematology and Oncology

## 2013-11-02 DIAGNOSIS — D649 Anemia, unspecified: Secondary | ICD-10-CM

## 2013-11-02 DIAGNOSIS — D472 Monoclonal gammopathy: Secondary | ICD-10-CM | POA: Insufficient documentation

## 2013-11-03 ENCOUNTER — Other Ambulatory Visit: Payer: Medicaid Other | Admitting: Lab

## 2013-11-03 ENCOUNTER — Ambulatory Visit: Payer: Medicaid Other

## 2013-11-03 ENCOUNTER — Other Ambulatory Visit: Payer: Self-pay | Admitting: *Deleted

## 2013-11-03 ENCOUNTER — Telehealth: Payer: Self-pay | Admitting: *Deleted

## 2013-11-03 NOTE — Telephone Encounter (Signed)
Informed pt of new orders placed for additional lab work, bone scan and office visit by Dr. Bertis Ruddy.  No aranesp injection until seen by Dr. Bertis Ruddy.  Pt verbalized understanding.  States cannot come in for lab tomorrow as scheduled though due to transportation problems..  Asked her to call scheduler to get lab r/s and xry and office visit scheduled.  Explained labs and xray need to be done at least one week prior to office visit so Dr. Bertis Ruddy will have all results prior to visit.  Pt verbalized understanding.

## 2013-11-04 ENCOUNTER — Telehealth: Payer: Self-pay | Admitting: Hematology and Oncology

## 2013-11-11 ENCOUNTER — Telehealth: Payer: Self-pay | Admitting: Hematology and Oncology

## 2013-11-11 NOTE — Telephone Encounter (Signed)
, °

## 2013-11-12 ENCOUNTER — Telehealth: Payer: Self-pay | Admitting: Hematology and Oncology

## 2013-11-17 ENCOUNTER — Telehealth: Payer: Self-pay | Admitting: Hematology and Oncology

## 2013-11-17 ENCOUNTER — Ambulatory Visit (HOSPITAL_COMMUNITY): Payer: Medicaid Other

## 2013-11-17 ENCOUNTER — Other Ambulatory Visit: Payer: Medicaid Other

## 2013-11-17 NOTE — Telephone Encounter (Signed)
pt called wanted to cancel 11/18 and 11/19 appts stating her blood sugar today in excess of 540 and she cannot drive States she will call later to [facility] sh[facility]

## 2013-11-18 ENCOUNTER — Ambulatory Visit: Payer: Medicaid Other | Admitting: Hematology and Oncology

## 2013-12-01 ENCOUNTER — Ambulatory Visit: Payer: Medicaid Other

## 2013-12-01 ENCOUNTER — Other Ambulatory Visit: Payer: Medicaid Other | Admitting: Lab

## 2013-12-29 ENCOUNTER — Ambulatory Visit: Payer: Medicaid Other

## 2013-12-29 ENCOUNTER — Other Ambulatory Visit: Payer: Medicaid Other | Admitting: Lab

## 2014-01-19 ENCOUNTER — Telehealth: Payer: Self-pay | Admitting: Hematology and Oncology

## 2014-01-19 NOTE — Telephone Encounter (Signed)
returned pt called and advised on 1.26.15 appt per MD staff

## 2014-01-25 ENCOUNTER — Other Ambulatory Visit (HOSPITAL_BASED_OUTPATIENT_CLINIC_OR_DEPARTMENT_OTHER): Payer: Medicaid Other

## 2014-01-25 ENCOUNTER — Ambulatory Visit: Payer: Medicaid Other

## 2014-01-25 ENCOUNTER — Ambulatory Visit (HOSPITAL_BASED_OUTPATIENT_CLINIC_OR_DEPARTMENT_OTHER): Payer: Medicaid Other | Admitting: Hematology and Oncology

## 2014-01-25 ENCOUNTER — Non-Acute Institutional Stay (HOSPITAL_COMMUNITY)
Admission: AD | Admit: 2014-01-25 | Discharge: 2014-01-25 | Disposition: A | Discharge status: Home / Self Care | Payer: Medicaid Other | Source: Ambulatory Visit | Attending: Hematology and Oncology | Admitting: Hematology and Oncology

## 2014-01-25 ENCOUNTER — Ambulatory Visit (HOSPITAL_COMMUNITY)
Admission: RE | Admit: 2014-01-25 | Discharge: 2014-01-25 | Disposition: A | Discharge status: Home / Self Care | Payer: Medicaid Other | Source: Ambulatory Visit | Attending: Hematology and Oncology | Admitting: Hematology and Oncology

## 2014-01-25 ENCOUNTER — Encounter: Payer: Self-pay | Admitting: Hematology and Oncology

## 2014-01-25 VITALS — BP 123/48 | HR 94 | Temp 97.5°F | Resp 20 | Ht 63.0 in | Wt 121.0 lb

## 2014-01-25 DIAGNOSIS — D472 Monoclonal gammopathy: Secondary | ICD-10-CM

## 2014-01-25 DIAGNOSIS — N179 Acute kidney failure, unspecified: Secondary | ICD-10-CM | POA: Insufficient documentation

## 2014-01-25 DIAGNOSIS — R1013 Epigastric pain: Secondary | ICD-10-CM | POA: Insufficient documentation

## 2014-01-25 DIAGNOSIS — D631 Anemia in chronic kidney disease: Secondary | ICD-10-CM | POA: Insufficient documentation

## 2014-01-25 DIAGNOSIS — D649 Anemia, unspecified: Secondary | ICD-10-CM

## 2014-01-25 DIAGNOSIS — N189 Chronic kidney disease, unspecified: Secondary | ICD-10-CM

## 2014-01-25 DIAGNOSIS — I2510 Atherosclerotic heart disease of native coronary artery without angina pectoris: Secondary | ICD-10-CM | POA: Insufficient documentation

## 2014-01-25 DIAGNOSIS — Z8673 Personal history of transient ischemic attack (TIA), and cerebral infarction without residual deficits: Secondary | ICD-10-CM | POA: Insufficient documentation

## 2014-01-25 DIAGNOSIS — E119 Type 2 diabetes mellitus without complications: Secondary | ICD-10-CM | POA: Insufficient documentation

## 2014-01-25 DIAGNOSIS — K921 Melena: Secondary | ICD-10-CM | POA: Insufficient documentation

## 2014-01-25 HISTORY — DX: Acute kidney failure, unspecified: N17.9

## 2014-01-25 HISTORY — DX: Acute kidney failure, unspecified: N18.9

## 2014-01-25 LAB — CBC & DIFF AND RETIC
BASO%: 0.2 % (ref 0.0–2.0)
BASOS ABS: 0 10*3/uL (ref 0.0–0.1)
EOS ABS: 0.1 10*3/uL (ref 0.0–0.5)
EOS%: 2.5 % (ref 0.0–7.0)
HCT: 24.3 % — ABNORMAL LOW (ref 34.8–46.6)
HEMOGLOBIN: 7.8 g/dL — AB (ref 11.6–15.9)
IMMATURE RETIC FRACT: 6.5 % (ref 1.60–10.00)
LYMPH#: 1.3 10*3/uL (ref 0.9–3.3)
LYMPH%: 28.7 % (ref 14.0–49.7)
MCH: 31 pg (ref 25.1–34.0)
MCHC: 32.1 g/dL (ref 31.5–36.0)
MCV: 96.4 fL (ref 79.5–101.0)
MONO#: 0.3 10*3/uL (ref 0.1–0.9)
MONO%: 6.2 % (ref 0.0–14.0)
NEUT%: 62.4 % (ref 38.4–76.8)
NEUTROS ABS: 2.7 10*3/uL (ref 1.5–6.5)
Platelets: 190 10*3/uL (ref 145–400)
RBC: 2.52 10*6/uL — AB (ref 3.70–5.45)
RDW: 12.8 % (ref 11.2–14.5)
RETIC %: 2 % (ref 0.70–2.10)
RETIC CT ABS: 50.4 10*3/uL (ref 33.70–90.70)
WBC: 4.4 10*3/uL (ref 3.9–10.3)

## 2014-01-25 LAB — IRON AND TIBC CHCC
%SAT: 36 % (ref 21–57)
Iron: 104 ug/dL (ref 41–142)
TIBC: 286 ug/dL (ref 236–444)
UIBC: 182 ug/dL (ref 120–384)

## 2014-01-25 LAB — COMPREHENSIVE METABOLIC PANEL (CC13)
ALBUMIN: 3.8 g/dL (ref 3.5–5.0)
ALK PHOS: 67 U/L (ref 40–150)
ALT: 20 U/L (ref 0–55)
AST: 18 U/L (ref 5–34)
Anion Gap: 8 mEq/L (ref 3–11)
BUN: 45.5 mg/dL — ABNORMAL HIGH (ref 7.0–26.0)
CALCIUM: 9.1 mg/dL (ref 8.4–10.4)
CHLORIDE: 107 meq/L (ref 98–109)
CO2: 21 mEq/L — ABNORMAL LOW (ref 22–29)
CREATININE: 2.1 mg/dL — AB (ref 0.6–1.1)
Glucose: 449 mg/dl — ABNORMAL HIGH (ref 70–140)
POTASSIUM: 4.5 meq/L (ref 3.5–5.1)
Sodium: 136 mEq/L (ref 136–145)
Total Bilirubin: 0.26 mg/dL (ref 0.20–1.20)
Total Protein: 6.4 g/dL (ref 6.4–8.3)

## 2014-01-25 LAB — PREPARE RBC (CROSSMATCH)

## 2014-01-25 LAB — FERRITIN CHCC: FERRITIN: 775 ng/mL — AB (ref 9–269)

## 2014-01-25 LAB — LACTATE DEHYDROGENASE (CC13): LDH: 213 U/L (ref 125–245)

## 2014-01-25 MED ORDER — HEPARIN SOD (PORK) LOCK FLUSH 100 UNIT/ML IV SOLN: 250.0000 [IU] | INTRAVENOUS | Status: DC | PRN

## 2014-01-25 MED ORDER — HEPARIN SOD (PORK) LOCK FLUSH 100 UNIT/ML IV SOLN: 500.0000 [IU] | Freq: Every day | INTRAVENOUS | Status: DC | PRN

## 2014-01-25 MED ORDER — SODIUM CHLORIDE 0.9 % IJ SOLN: 3.0000 mL | INTRAMUSCULAR | Status: DC | PRN

## 2014-01-25 MED ORDER — DARBEPOETIN ALFA-POLYSORBATE 300 MCG/0.6ML IJ SOLN
300.0000 ug | Freq: Once | INTRAMUSCULAR | Status: DC
  Filled 2014-01-25: qty 0.6

## 2014-01-25 MED ORDER — SODIUM CHLORIDE 0.9 % IJ SOLN: 10.0000 mL | INTRAMUSCULAR | Status: DC | PRN

## 2014-01-25 MED ORDER — SODIUM CHLORIDE 0.9 % IV SOLN
250.0000 mL | Freq: Once | INTRAVENOUS | Status: AC
  Administered 2014-01-25: 250 mL via INTRAVENOUS

## 2014-01-25 NOTE — Progress Notes (Signed)
[name] OFFICE PROGRESS NOTE  Latoya Norris, [name], had chronic kidney disease, monoclonal gammopathy of unknown significance, [name], [name], she had received blood transfusion for severe anemia INTERVAL HISTORY: [name] 63 y.o. female returns for further followup. She has not been seen for over a year but have been receiving erythropoietin stimulating agents up until 10/06/2013. Over the past month, [name], past surgical history, [name]; codeine phosphate; hydralazine hcl; penicillins; [name],000 Int'l Units by mouth daily.        . Choline  Fenofibrate 135 MG capsule Take 135 mg by mouth daily.      . ciprofloxacin-dexamethasone (CIPRODEX) otic suspension 3 drops 2 (two) times daily.      . clopidogrel (PLAVIX) 75 MG tablet Take 75 mg by mouth daily.        Marland Kitchen dicyclomine (BENTYL) 10 MG capsule Take 10 mg by mouth 2 (two) times daily at 10 AM and 5 PM.      . etodolac (LODINE) 400 MG tablet Take 400 mg by mouth 2 (two) times daily.      Marland Kitchen FLUoxetine (PROZAC) 20 MG capsule Take 20 mg by mouth daily.      Marland Kitchen gabapentin (NEURONTIN) 300 MG capsule Take 600 mg by mouth 3 (three) times daily.       . hydrochlorothiazide (HYDRODIURIL) 12.5 MG tablet Take 25 mg by mouth daily.       . insulin aspart (NOVOLOG) 100 UNIT/ML injection Inject 15 Units into the skin 3 (three) times daily before meals.        . nitroGLYCERIN (NITROSTAT) 0.4 MG SL tablet Place 0.4 mg under the tongue every 5 (five) minutes as needed.        . pantoprazole (PROTONIX) 40 MG tablet Take 40 mg by mouth daily.      . promethazine (PHENERGAN) 25 MG tablet Take 1/2 to 1 tablet by mouth every six hours as needed for nausea  30 tablet  0  . simvastatin (ZOCOR) 20 MG tablet Take 20 mg by mouth at bedtime.        . topiramate (TOPAMAX)  100 MG tablet Take 100 mg by mouth 2 (two) times daily.       . traMADol (ULTRAM) 50 MG tablet Take 50 mg by mouth every 8 (eight) hours as needed. Maximum dose= 8 tablets per day      . insulin glargine (LANTUS) 100 UNIT/ML injection Inject 15 Units into the skin every morning.  10 mL  0   No current facility-administered medications for this visit.     REVIEW OF SYSTEMS:   Constitutional: Denies fevers, [name], nose, mouth, throat, [name], [name], [name], [name], no new changes  All other systems were reviewed with the patient and are negative.  PHYSICAL EXAMINATION: ECOG PERFORMANCE STATUS: 1 - Symptomatic but completely ambulatory  Filed Vitals:   01/25/14 1403  BP: 123/48  Pulse: 94  Temp: 97.5 F (36.4 C)  Resp: 20   Filed Weights   01/25/14 1403  Weight: 121 lb (54.885 kg)    GENERAL:alert, [name], texture, turgor are normal, [name], [name], [name], no erythema and lips, buccal mucosa, and tongue normal  NECK: supple, thyroid normal size, non-tender, [name], [name], [name], no focal motor/sensory deficits  LABORATORY DATA:  I have reviewed the data as listed Results for orders placed in visit on 01/25/14 (from the past 48 hour(s))  CBC & DIFF AND RETIC     Status: Abnormal   Collection Time    01/25/14  1:33 PM      Result Value Range   WBC 4.4  3.9 - 10.3 10e3/uL   NEUT# 2.7  1.5 - 6.5 10e3/uL   HGB 7.8 (*) 11.6 - 15.9 g/dL   HCT 24.3 (*) 34.8 - 46.6 %   Platelets 190  145 - 400 10e3/uL   MCV 96.4  79.5 - 101.0 fL   MCH 31.0  25.1 - 34.0 pg   MCHC 32.1  31.5 - 36.0 g/dL   RBC 2.52 (*) 3.70 - 5.45 10e6/uL   RDW 12.8  11.2 - 14.5 %   lymph# 1.3  0.9 - 3.3 10e3/uL   MONO# 0.3  0.1 - 0.9 10e3/uL   Eosinophils Absolute 0.1  0.0 - 0.5 10e3/uL   Basophils Absolute 0.0  0.0 -  0.1 10e3/uL   NEUT% 62.4  38.4 - 76.8 %   LYMPH% 28.7  14.0 - 49.7 %   MONO% 6.2  0.0 - 14.0 %   EOS% 2.5  0.0 - 7.0 %   BASO% 0.2  0.0 - 2.0 %   Retic % 2.00  0.70 - 2.10 %   Retic Ct Abs 50.40  33.70 - 90.70 10e3/uL   Immature Retic Fract 6.50  1.60 - 10.00 %  LACTATE DEHYDROGENASE (CC13)     Status: None  Collection Time    01/25/14  1:33 PM      Result Value Range   LDH 213  125 - 245 U/L  COMPREHENSIVE METABOLIC PANEL (OF75)     Status: Abnormal   Collection Time    01/25/14  1:33 PM      Result Value Range   Sodium 136  136 - 145 mEq/L   Potassium 4.5  3.5 - 5.1 mEq/L   Chloride 107  98 - 109 mEq/L   CO2 21 (*) 22 - 29 mEq/L   Glucose 449 (*) 70 - 140 mg/dl   BUN 45.5 (*) 7.0 - 26.0 mg/dL   Creatinine 2.1 (*) 0.6 - 1.1 mg/dL   Total Bilirubin 0.26  0.20 - 1.20 mg/dL   Alkaline Phosphatase 67  40 - 150 U/L   AST 18  5 - 34 U/L   ALT 20  0 - 55 U/L   Total Protein 6.4  6.4 - 8.3 g/dL   Albumin 3.8  3.5 - 5.0 g/dL   Calcium 9.1  8.4 - 10.4 mg/dL   Anion Gap 8  3 - 11 mEq/L  FERRITIN [facility]     Status: Abnormal   Collection Time    01/25/14  1:33 PM      Result Value Range   Ferritin 775 (*) 9 - 269 ng/ml  IRON AND TIBC [facility]     Status: None   Collection Time    01/25/14  1:33 PM      Result Value Range   Iron 104  41 - 142 ug/dL   TIBC 286  236 - 444 ug/dL   UIBC 182  120 - 384 ug/dL   %SAT 36  21 - 57 %    Lab Results  Component Value Date   WBC 4.4 01/25/2014   HGB 7.8* 01/25/2014   HCT 24.3* 01/25/2014   MCV 96.4 01/25/2014   PLT 190 01/25/2014   ASSESSMENT & PLAN:  #1 severe anemia Iron studies is high. I suspect the cause of her acute anemia is due to a GI bleed given her recent epigastric pain, melena, as well as significant nonsteroidal anti-inflammatory medications and antiplatelet agents. There is also a component of anemia chronic disease from chronic kidney disease.  We discussed some of the risks, benefits, and alternatives of blood transfusions. The  patient is symptomatic from anemia and the hemoglobin level is critically low.  Some of the side-effects to be expected including risks of transfusion reactions, chills, infection, syndrome of volume overload and risk of hospitalization from various reasons and the patient is willing to proceed and went ahead to sign consent today. We'll proceed with one unit of blood transfusion I will hold of giving her erythropoietin stimulating agents until I see her back next time #2 recent melena and epigastric pain I'm going to refer her to GI service for further evaluation to rule out GI bleed #3 history of coronary artery disease and TIA  the patient is on both aspirin and Plavix. I recommend cardiology evaluation to determine whether she indeed need to remain on both antiplatelet agents #4 acute renal failure This is acute on chronic in nature. Her diabetes appears to be poorly controlled. Further testing for myeloma is pending. I suspect this could be due to poor perfusion to the kidneys. I recommend blood transfusion and we'll recheck her blood next visit #5 monoclonal gammopathy of unknown significance Further testing is still pending. #6 poorly controlled diabetes Her blood sugars over  400. The patient is complaining of peripheral neuropathy. She has not seen her internist for over 2 years. I recommend referral to another internist as the patient is determined not to return to her prior physician. All questions were answered. The patient knows to call the clinic with any problems, questions or concerns. No barriers to learning was detected.  I spent 40 minutes counseling the patient face to face. The total time spent in the appointment was 60 minutes and more than 50% was on counseling.     [facility], [place], MD 01/25/2014 3:03 PM

## 2014-01-25 NOTE — Discharge Instructions (Signed)

## 2014-01-25 NOTE — Progress Notes (Addendum)
[facility]  Procedure Note  [name] YEM:[id number] DOB: 1951/03/15 DOA: 01/25/2014   PCP: Arnette Norris, MD   Associated Diagnosis: Anemia  Procedure Note: 1 Unit PRBC given.    Condition During Procedure: Tolerated well. VSS.    Condition at Discharge: Patient tolerated well. No complaints. Patient in no apparent distress. Educated patient to seek medical attention if any changes in her condition or concerns. Patient instructed to follow up with primary provider. Patient acknowledges. Patient escorted by nursing staff with belongings via wheelchair to front lobby to meet family member.    Wendie Simmer, RN  [facility]

## 2014-01-25 NOTE — Discharge Summary (Signed)
This patient is transferred to the sickle cell unit to receive blood transfusion. Please see the note from the office visit today for further details.

## 2014-01-26 ENCOUNTER — Telehealth: Payer: Self-pay | Admitting: *Deleted

## 2014-01-26 LAB — TYPE AND SCREEN
ABO/RH(D): O POS
Antibody Screen: NEGATIVE
Unit division: 0

## 2014-01-26 NOTE — Telephone Encounter (Signed)
Pt reports was unable to start her 24 hr urine collection due to having to run errands outside of the house today.  She has appt tomorrow w/ GI MD and does not think it is a good time to try to collect urine tomorrow either.  She may try to stay home on Thursday and do the collection and bring jug back to our lab this Friday.  Encouraged pt to try to plan for this. Otherwise she will need to do next week.  She verbalized understanding.

## 2014-01-27 ENCOUNTER — Ambulatory Visit: Payer: Medicaid Other | Admitting: Gastroenterology

## 2014-01-27 LAB — VITAMIN B12: Vitamin B-12: 858 pg/mL (ref 211–911)

## 2014-01-27 LAB — SPEP & IFE WITH QIG
ALPHA-1-GLOBULIN: 4.6 % (ref 2.9–4.9)
ALPHA-2-GLOBULIN: 7.2 % (ref 7.1–11.8)
Albumin ELP: 64.7 % (ref 55.8–66.1)
BETA GLOBULIN: 5.6 % (ref 4.7–7.2)
Beta 2: 3.6 % (ref 3.2–6.5)
Gamma Globulin: 14.3 % (ref 11.1–18.8)
IGG (IMMUNOGLOBIN G), SERUM: 1030 mg/dL (ref 690–1700)
IgA: 63 mg/dL — ABNORMAL LOW (ref 69–380)
IgM, Serum: 54 mg/dL (ref 52–322)
M-SPIKE, %: 0.35 g/dL
Total Protein, Serum Electrophoresis: 6.1 g/dL (ref 6.0–8.3)

## 2014-01-27 LAB — KAPPA/LAMBDA LIGHT CHAINS
KAPPA FREE LGHT CHN: 6.67 mg/dL — AB (ref 0.33–1.94)
KAPPA LAMBDA RATIO: 3.51 — AB (ref 0.26–1.65)
Lambda Free Lght Chn: 1.9 mg/dL (ref 0.57–2.63)

## 2014-01-27 LAB — ERYTHROPOIETIN: ERYTHROPOIETIN: 18.8 m[IU]/mL — AB (ref 2.6–18.5)

## 2014-01-27 LAB — DIRECT ANTIGLOBULIN TEST (NOT AT ARMC)
DAT (Complement): NEGATIVE
DAT IGG: NEGATIVE

## 2014-01-27 LAB — SEDIMENTATION RATE: Sed Rate: 5 mm/hr (ref 0–22)

## 2014-01-27 LAB — BETA 2 MICROGLOBULIN, SERUM: BETA 2 MICROGLOBULIN: 3.92 mg/L — AB (ref 1.01–1.73)

## 2014-01-29 ENCOUNTER — Telehealth: Payer: Self-pay | Admitting: Hematology and Oncology

## 2014-01-29 NOTE — Telephone Encounter (Signed)
Gave all appt to pt, cardiology and GI appt, [facility] does not accept medicaid for Primary doctors appt, suggested to pt to try other doctors office , schedulers dont refer to primary app

## 2014-02-01 ENCOUNTER — Ambulatory Visit (INDEPENDENT_AMBULATORY_CARE_PROVIDER_SITE_OTHER): Payer: Medicaid Other | Admitting: Gastroenterology

## 2014-02-01 ENCOUNTER — Encounter: Payer: Self-pay | Admitting: Gastroenterology

## 2014-02-01 VITALS — BP 110/60 | HR 88 | Ht 63.0 in | Wt 120.0 lb

## 2014-02-01 DIAGNOSIS — D649 Anemia, unspecified: Secondary | ICD-10-CM

## 2014-02-01 DIAGNOSIS — R1013 Epigastric pain: Secondary | ICD-10-CM

## 2014-02-01 DIAGNOSIS — R195 Other fecal abnormalities: Secondary | ICD-10-CM

## 2014-02-01 DIAGNOSIS — K921 Melena: Secondary | ICD-10-CM

## 2014-02-01 NOTE — Patient Instructions (Addendum)
You have been scheduled for an endoscopy with propofol. Please follow written instructions given to you at your visit today. If you use inhalers (even only as needed), please bring them with you on the day of your procedure.   You have been given hemoccult cards to complete.  Follow the instructions and mail them back.  You will be contacted by our office prior to your procedure for directions on holding your Plavix.  If you do not hear from our office 1 week prior to your scheduled procedure, please call [phone] to discuss.

## 2014-02-02 ENCOUNTER — Encounter: Payer: Self-pay | Admitting: Gastroenterology

## 2014-02-02 DIAGNOSIS — D649 Anemia, unspecified: Secondary | ICD-10-CM | POA: Insufficient documentation

## 2014-02-02 DIAGNOSIS — K921 Melena: Secondary | ICD-10-CM | POA: Insufficient documentation

## 2014-02-02 DIAGNOSIS — R1013 Epigastric pain: Secondary | ICD-10-CM | POA: Insufficient documentation

## 2014-02-02 NOTE — Progress Notes (Signed)
Reviewed and agree.

## 2014-02-02 NOTE — Progress Notes (Signed)
02/02/2014 [name] [id number] [birth year]   HISTORY OF PRESENT ILLNESS:  This is a 63 year old female who is somewhat of a poor historian, with past medical history of anemia, coronary artery disease, TIAs (on plavix), monoclonal gammopathy of unknown significance, poorly controlled diabetes, and chronic kidney disease. She has been referred here by her hematologist, Dr. Alvy Bimler, for evaluation of her anemia. The patient tells me that she has experienced issues with anemia for quite some time. She has followed with hematology and had been on iron supplements without success in the past. She's required blood transfusions in the past, most recently she received one unit on January 26 for a hemoglobin of 7.8 grams. She is due for repeat lab work at the end of this week.  She has also received erythropoietin stimulating agent over the years as well. Her most recent iron studies are normal.  She does take chronic NSAIDs in the form of Etodolac twice daily for arthritis.  She has experienced occasional episodes of dark stools recently. She also relates that she had some episodes of nose-bleeding a couple of weeks ago as well.  It states in her notes from hematology that she has a history of peptic ulcer disease, but I do not believe that if so; she has a history of having some rectal ulcers that were thought to be due to ischemia as seen on colonoscopy in 2010.  She is on Protonix 40 mg daily. She says that she does have some epigastric abdominal pain but that has been present for quite some time and she in fact has some abdominal discomfort all over.  She denies seeing any red blood in her stool or rectal discomfort.  She says that ever since her hospitalization in 2010 that she has difficulty swallowing and was evaluated in the hospital at that time; was told that she needed to tuck her chin when she swallows food and has needed to perform that maneuver since that time.  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 Biopsies revealed fragments of benign colorectal mucosa with fibrinopurulent exudate consistent with ulcer. It was recommended she have a repeat colonoscopy in 10 years from that time.   Past Medical History  Diagnosis Date  . DIABETES MELLITUS, II, COMPLICATIONS 0/25/8527  . HYPERLIPIDEMIA 02/27/2007  . 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 ANEMIA, OTHER, UNSPECIFIED 02/27/2007  . DEPRESSIVE DISORDER, NOS 02/27/2007  . HYPERTENSION, BENIGN SYSTEMIC 02/27/2007  . CORONARY, ARTERIOSCLEROSIS 02/27/2007  . TIA 05/17/2009  . GASTROESOPHAGEAL REFLUX, NO ESOPHAGITIS 02/27/2007  . RENAL INSUFFICIENCY, CHRONIC 12/28/2009  . Renal failure, acute on chronic 01/25/2014   Past Surgical History  Procedure Laterality Date  . Partial hysterectomy    . Rectocele repair    . Bladder surgery      Tack    reports that she has never smoked. She has never used smokeless tobacco. She reports that she does not drink alcohol or use illicit drugs. family history includes Cancer in her mother and sister. Allergies  Allergen Reactions  . Bisphosphonates   . Codeine Phosphate     REACTION: Nausea and vomiting  . Hydralazine Hcl     REACTION: swelling  . Penicillins     REACTION: Itching  . Sulfa Antibiotics Itching      Outpatient Encounter Prescriptions as of 02/01/2014  Medication Sig  . ALPRAZolam (XANAX) 1 MG tablet 1 tab by mouth 3 times a day as needed for anxiety  . aspirin 81 MG tablet Take 81 mg by mouth daily.    . Cholecalciferol (VITAMIN D-3 PO) Take 5,000 Int'l Units by mouth daily.    . clopidogrel (PLAVIX) 75 MG tablet Take 75 mg by mouth daily.    Marland Kitchen dicyclomine (BENTYL) 10 MG capsule Take 10 mg by mouth 2 (two) times daily at 10 AM and 5 PM.  . FLUoxetine (PROZAC) 20 MG capsule Take 20 mg by mouth  daily.  Marland Kitchen gabapentin (NEURONTIN) 300 MG capsule Take 600 mg by mouth 3 (three) times daily.   . hydrochlorothiazide (HYDRODIURIL) 12.5 MG tablet Take 25 mg by mouth daily.   . insulin aspart (NOVOLOG) 100 UNIT/ML injection Inject into the skin. Sliding scale  . insulin glargine (LANTUS) 100 UNIT/ML injection Inject 14 Units into the skin every morning.  Marland Kitchen lisinopril (PRINIVIL,ZESTRIL) 2.5 MG tablet Take 2.5 mg by mouth daily.  . nitroGLYCERIN (NITROSTAT) 0.4 MG SL tablet Place 0.4 mg under the tongue every 5 (five) minutes as needed.    . pantoprazole (PROTONIX) 40 MG tablet Take 40 mg by mouth daily.  . promethazine (PHENERGAN) 25 MG tablet Take 25 mg by mouth. by mouth every six hours as needed for nausea  . simvastatin (ZOCOR) 20 MG tablet Take 20 mg by mouth at bedtime.   . topiramate (TOPAMAX) 100 MG tablet Take 100 mg by mouth 2 (two) times daily.   . traMADol (ULTRAM) 50 MG tablet Take 50 mg by mouth every 8 (eight) hours as needed. Maximum dose= 8 tablets per day  . zolpidem (AMBIEN) 10 MG tablet Take 10 mg by mouth at bedtime as needed for sleep.  . [DISCONTINUED] insulin glargine (LANTUS) 100 UNIT/ML injection Inject 15 Units into the skin every morning.  . [DISCONTINUED] promethazine (PHENERGAN) 25 MG tablet Take 1/2 to 1 tablet by mouth every six hours as needed for nausea  . [DISCONTINUED] alendronate (FOSAMAX) 70 MG tablet Take 70 mg by mouth every 7 (seven) days. Take with a full glass of water on an empty stomach.  . [DISCONTINUED] Choline Fenofibrate 135 MG capsule Take 135 mg by mouth daily.  . [DISCONTINUED] ciprofloxacin-dexamethasone (CIPRODEX) otic suspension 3 drops 2 (two) times daily.  . [DISCONTINUED] etodolac (LODINE) 400 MG tablet Take 400 mg by mouth 2 (two) times daily.     REVIEW OF SYSTEMS  : All other systems reviewed and negative except where noted in the History of Present Illness.   PHYSICAL EXAM: BP 110/60  Pulse 88  Ht 5\' 3"  (1.6 m)  Wt 120 lb  (54.432 kg)  BMI 21.26 kg/m2 General: Well developed white female in no acute distress Head: Normocephalic and atraumatic Eyes:  Sclerae anicteric, conjunctiva pink. Ears: Normal auditory acuity.  Lungs: Clear throughout to auscultation Heart: Regular rate and rhythm Abdomen: Soft, non-distended.  BS present.  Mild epigastric TTP without R/R/G. Musculoskeletal: Symmetrical with no gross deformities  Skin: No lesions on visible extremities Extremities: No edema  Neurological: Alert oriented x 4, grossly non-focal Psychological:  Alert and cooperative. Normal mood and affect  ASSESSMENT AND PLAN: -Anemia:  Long-standing issues with this and follows with hematology. Apparently had been on iron supplements in the past without success. Has required blood transfusions in the past as well. Recently received one unit of packed red blood cells 01/25/2014. Her most recent iron studies are normal. She also receives erythropoietin  stimulating agents. -Black stools:  Intermittent. Reports issues with epistaxis couple of weeks ago as well. -Epigastric abdominal pain:  This seems to be an ongoing chronic issue as well.  She is on pantoprazole 40 mg daily. -NSAID use   *We will schedule her for an EGD for further evaluation to rule out peptic ulcer disease at the request of her hematologist. I'm not totally convinced that her anemia is related to GI blood loss.  The risks, benefits, and alternatives were discussed with the patient and she consents to proceed.  She is on Plavix and we will need to get the okay to hold that. Her PCP is Dr. Brunetta Genera, but she is seeing her new cardiologist next week to get a plan on whether she needs to continue the Plavix. We will wait to contact him after that visit.  *We will also check stool for occult blood x 3.  If they are negative, then likely will not need any further GI evaluation after completion of EGD.

## 2014-02-05 ENCOUNTER — Telehealth: Payer: Self-pay | Admitting: *Deleted

## 2014-02-05 ENCOUNTER — Other Ambulatory Visit (HOSPITAL_BASED_OUTPATIENT_CLINIC_OR_DEPARTMENT_OTHER): Payer: Medicaid Other

## 2014-02-05 DIAGNOSIS — D472 Monoclonal gammopathy: Secondary | ICD-10-CM

## 2014-02-05 DIAGNOSIS — N179 Acute kidney failure, unspecified: Secondary | ICD-10-CM

## 2014-02-05 DIAGNOSIS — N189 Chronic kidney disease, unspecified: Secondary | ICD-10-CM

## 2014-02-05 DIAGNOSIS — D649 Anemia, unspecified: Secondary | ICD-10-CM

## 2014-02-05 LAB — BASIC METABOLIC PANEL (CC13)
Anion Gap: 8 mEq/L (ref 3–11)
BUN: 52.1 mg/dL — ABNORMAL HIGH (ref 7.0–26.0)
CALCIUM: 9.3 mg/dL (ref 8.4–10.4)
CO2: 24 mEq/L (ref 22–29)
Chloride: 110 mEq/L — ABNORMAL HIGH (ref 98–109)
Creatinine: 1.9 mg/dL — ABNORMAL HIGH (ref 0.6–1.1)
GLUCOSE: 267 mg/dL — AB (ref 70–140)
POTASSIUM: 4.6 meq/L (ref 3.5–5.1)
Sodium: 142 mEq/L (ref 136–145)

## 2014-02-05 LAB — CBC WITH DIFFERENTIAL/PLATELET
BASO%: 0.3 % (ref 0.0–2.0)
BASOS ABS: 0 10*3/uL (ref 0.0–0.1)
EOS ABS: 0.1 10*3/uL (ref 0.0–0.5)
EOS%: 3.4 % (ref 0.0–7.0)
HCT: 27.9 % — ABNORMAL LOW (ref 34.8–46.6)
HEMOGLOBIN: 9.4 g/dL — AB (ref 11.6–15.9)
LYMPH%: 26.9 % (ref 14.0–49.7)
MCH: 31.9 pg (ref 25.1–34.0)
MCHC: 33.8 g/dL (ref 31.5–36.0)
MCV: 94.5 fL (ref 79.5–101.0)
MONO#: 0.3 10*3/uL (ref 0.1–0.9)
MONO%: 7.8 % (ref 0.0–14.0)
NEUT%: 61.6 % (ref 38.4–76.8)
NEUTROS ABS: 2.6 10*3/uL (ref 1.5–6.5)
Platelets: 170 10*3/uL (ref 145–400)
RBC: 2.95 10*6/uL — ABNORMAL LOW (ref 3.70–5.45)
RDW: 13.3 % (ref 11.2–14.5)
WBC: 4.2 10*3/uL (ref 3.9–10.3)
lymph#: 1.1 10*3/uL (ref 0.9–3.3)

## 2014-02-05 LAB — HOLD TUBE, BLOOD BANK

## 2014-02-05 NOTE — Telephone Encounter (Signed)
Dr. Alvy Bimler reviewed pt's CBC results and instructs no aranesp today,  Come for visit next week as scheduled.  S/w pt in lobby and informed of results and return for MD visit on Monday 2/09 as scheduled. She verbalized understanding.

## 2014-02-08 ENCOUNTER — Other Ambulatory Visit: Payer: Medicaid Other

## 2014-02-08 ENCOUNTER — Encounter: Payer: Self-pay | Admitting: Hematology and Oncology

## 2014-02-08 ENCOUNTER — Ambulatory Visit (HOSPITAL_BASED_OUTPATIENT_CLINIC_OR_DEPARTMENT_OTHER): Payer: Medicaid Other | Admitting: Hematology and Oncology

## 2014-02-08 VITALS — BP 153/75 | HR 77 | Temp 96.8°F | Resp 20 | Wt 122.3 lb

## 2014-02-08 DIAGNOSIS — N189 Chronic kidney disease, unspecified: Secondary | ICD-10-CM

## 2014-02-08 DIAGNOSIS — N039 Chronic nephritic syndrome with unspecified morphologic changes: Secondary | ICD-10-CM

## 2014-02-08 DIAGNOSIS — D631 Anemia in chronic kidney disease: Secondary | ICD-10-CM

## 2014-02-08 DIAGNOSIS — D649 Anemia, unspecified: Secondary | ICD-10-CM

## 2014-02-08 DIAGNOSIS — IMO0001 Reserved for inherently not codable concepts without codable children: Secondary | ICD-10-CM

## 2014-02-08 DIAGNOSIS — D472 Monoclonal gammopathy: Secondary | ICD-10-CM

## 2014-02-08 MED ORDER — DARBEPOETIN ALFA-POLYSORBATE 300 MCG/0.6ML IJ SOLN
300.0000 ug | Freq: Once | INTRAMUSCULAR | Status: AC
  Administered 2014-02-08: 300 ug via SUBCUTANEOUS
  Filled 2014-02-08: qty 0.6

## 2014-02-09 ENCOUNTER — Encounter: Payer: Self-pay | Admitting: Cardiovascular Disease

## 2014-02-09 ENCOUNTER — Ambulatory Visit (INDEPENDENT_AMBULATORY_CARE_PROVIDER_SITE_OTHER): Payer: Medicaid Other | Admitting: Cardiovascular Disease

## 2014-02-09 VITALS — BP 158/80 | HR 85 | Ht 63.0 in | Wt 121.8 lb

## 2014-02-09 DIAGNOSIS — R0989 Other specified symptoms and signs involving the circulatory and respiratory systems: Secondary | ICD-10-CM

## 2014-02-09 DIAGNOSIS — R0600 Dyspnea, unspecified: Secondary | ICD-10-CM

## 2014-02-09 DIAGNOSIS — I2510 Atherosclerotic heart disease of native coronary artery without angina pectoris: Secondary | ICD-10-CM

## 2014-02-09 DIAGNOSIS — R079 Chest pain, unspecified: Secondary | ICD-10-CM

## 2014-02-09 DIAGNOSIS — R0609 Other forms of dyspnea: Secondary | ICD-10-CM

## 2014-02-09 NOTE — Progress Notes (Signed)
History of Present Illness: 63 yo female with history of CAD, DM, HLD, TIA, GERD, chronic kidney disease, MGUS, anemia here today as new patient to establish cardiology care. She has had recent issues with anemia. She is followed in hematology. Possible recent GI bleeding but most likely her anemia is due to anemia of chronic disease with renal insufficiency and MGUS. She has been on Plavix. Cardiac cath 08/05/08 with 50% proximal LAD stenosis, patent stent Circumflex into OM, 30% proximal RCA stenosis. No follow up in our cardiology office since her cath in 2009.   She is here today with the question of need for long term Plavix therapy. She tells me that she has been having chest pains several times per week. This occurs at rest and with exertion. She also has been having dyspnea.   Primary Care Physician: [facility])  Last Lipid Profile:Lipid Panel     Component Value Date/Time   CHOL 145 12/28/2009 1030   TRIG 148.0 12/28/2009 1030   HDL 55.80 12/28/2009 1030   CHOLHDL 3 12/28/2009 1030   VLDL 29.6 12/28/2009 1030   LDLCALC 60 12/28/2009 1030    Past Medical History  Diagnosis Date  . DIABETES MELLITUS, II, COMPLICATIONS 02/27/2007  . HYPERLIPIDEMIA 02/27/2007  . 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 ANEMIA, OTHER, UNSPECIFIED 02/27/2007  . DEPRESSIVE DISORDER, NOS 02/27/2007  . HYPERTENSION, BENIGN SYSTEMIC 02/27/2007  . CORONARY, ARTERIOSCLEROSIS 02/27/2007  . TIA 05/17/2009  . GASTROESOPHAGEAL REFLUX, NO ESOPHAGITIS 02/27/2007  . RENAL INSUFFICIENCY, CHRONIC 12/28/2009  . Renal failure, acute on chronic 01/25/2014    Past Surgical History  Procedure Laterality Date  . Partial hysterectomy    . Rectocele repair    . Bladder surgery      Tack    Current Outpatient Prescriptions  Medication Sig Dispense Refill  . ALPRAZolam (XANAX) 1 MG tablet 1 tab by mouth 3 times a day as needed for anxiety  90 tablet  0  . aspirin 81 MG tablet Take 81 mg by mouth daily.        .  Cholecalciferol (VITAMIN D-3 PO) Take 5,000 Int'l Units by mouth daily.        . clopidogrel (PLAVIX) 75 MG tablet Take 75 mg by mouth daily.        Marland Kitchen dicyclomine (BENTYL) 10 MG capsule Take 10 mg by mouth 2 (two) times daily at 10 AM and 5 PM.      . FLUoxetine (PROZAC) 20 MG capsule Take 20 mg by mouth daily.      Marland Kitchen gabapentin (NEURONTIN) 300 MG capsule Take 600 mg by mouth 3 (three) times daily.       . hydrochlorothiazide (HYDRODIURIL) 12.5 MG tablet Take 25 mg by mouth daily.       . insulin aspart (NOVOLOG) 100 UNIT/ML injection Inject into the skin. Sliding scale      . insulin glargine (LANTUS) 100 UNIT/ML injection Inject 14 Units into the skin every morning.      Marland Kitchen lisinopril (PRINIVIL,ZESTRIL) 2.5 MG tablet Take 2.5 mg by mouth daily.      . nitroGLYCERIN (NITROSTAT) 0.4 MG SL tablet Place 0.4 mg under the tongue every 5 (five) minutes as needed.        . pantoprazole (PROTONIX) 40 MG tablet Take 40 mg by mouth daily.      . promethazine (PHENERGAN) 25 MG tablet Take 25 mg by mouth. by mouth every six hours as needed for nausea      .  simvastatin (ZOCOR) 20 MG tablet Take 20 mg by mouth at bedtime.       . topiramate (TOPAMAX) 100 MG tablet Take 100 mg by mouth 2 (two) times daily.       . traMADol (ULTRAM) 50 MG tablet Take 50 mg by mouth every 8 (eight) hours as needed. Maximum dose= 8 tablets per day      . zolpidem (AMBIEN) 10 MG tablet Take 10 mg by mouth at bedtime as needed for sleep.       No current facility-administered medications for this visit.    Allergies  Allergen Reactions  . Bisphosphonates   . Codeine Phosphate     REACTION: Nausea and vomiting  . Hydralazine Hcl     REACTION: swelling  . Penicillins     REACTION: Itching  . Sulfa Antibiotics Itching    History   Social History  . Marital Status: Divorced    Spouse Name: N/A    Number of Children: 2  . Years of Education: N/A   Occupational History  . Disabled    Social History Main Topics  .  Smoking status: Never Smoker   . Smokeless tobacco: Never Used  . Alcohol Use: No  . Drug Use: No  . Sexual Activity: Not on file   Other Topics Concern  . Not on file   Social History Narrative  . No narrative on file    Family History  Problem Relation Age of Onset  . Cancer Mother     unknown  . Cancer Sister     unknown cancer ?breast ca  . CAD Mother     Review of Systems:  As stated in the HPI and otherwise negative.   BP 158/80  Pulse 85  Ht 5\' 3"  (1.6 m)  Wt 121 lb 12.8 oz (55.248 kg)  BMI 21.58 kg/m2  Physical Examination: General: Well developed, well nourished, NAD HEENT: OP clear, mucus membranes moist SKIN: warm, dry. No rashes. Neuro: No focal deficits Musculoskeletal: Muscle strength 5/5 all ext Psychiatric: Mood and affect normal Neck: No JVD, no carotid bruits, no thyromegaly, no lymphadenopathy. Lungs:Clear bilaterally, no wheezes, rhonci, crackles Cardiovascular: Regular rate and rhythm. No murmurs, gallops or rubs. Abdomen:Soft. Bowel sounds present. [name].  Extremities: No lower extremity edema. Pulses are 2 + in the bilateral DP/PT.  Cardiac cath 08/05/08: The left main coronary artery. The left main coronary artery  is free of any significant disease.  Left anterior descending artery. Left anterior descending artery gave  rise to a diagonal branch and two septal perforators as well as a small  diagonal branch. There was 50% proximal stenosis in the LAD.  The circumflex artery. The circumflex artery gave rise to a large  marginal branch and a small AV branch. There was a stent beginning in  the proximal circumflex artery and extending into the marginal branch.  This had less than 10% stenosis. There was 40% narrowing in the  proximal edge of the stent.  The right coronary artery. The right coronary artery has a large  dominant vessel that gave rise to two right ventricular branch, the  posterior descending branch, and three  posterolateral branches. There  was 30% narrowing in the proximal right coronary artery. Thus, the  vessel is free of significant disease.   EKG: Sinus, rate 85 bpm.   Assessment and Plan:   1. CAD: She has known CAD with prior stenting of Circumflex in 2005 and most recent cath in 2009 with moderate  LAD disease. She has not seen a cardiologist in 5 years. She is now having chest pain and SOB concerning for angina. Could be related to anemia. Will arrange Lexiscan stress myoview to exclude ischemia and echo to exclude structural heart disease and assess LVEF. Continue ASA, Plavix and statin for now. Hold on EGD until cardiac workup is complete.   2. Chest pain: Stress test as above.   3. Dyspnea: Echo as above.

## 2014-02-09 NOTE — Telephone Encounter (Signed)
sw pt gv appts for 03/02/14 w/ labs@ 8 and inj@ 8:30am. i also gv the rest of the appts however the pt stated that her cousin would have to call back and reschedule....then pt called right back and stated to cancel all the appts and she will call back at a later time to rs...td

## 2014-02-09 NOTE — Patient Instructions (Signed)
Your physician recommends that you schedule a follow-up appointment in:  About 4 weeks.   Your physician has requested that you have an echocardiogram. Echocardiography is a painless test that uses sound waves to create images of your heart. It provides your doctor with information about the size and shape of your heart and how well your heart's chambers and valves are working. This procedure takes approximately one hour. There are no restrictions for this procedure.   Your physician has requested that you have a lexiscan myoview. For further information please visit [email or web address]. Please follow instruction sheet, as given.

## 2014-02-09 NOTE — Progress Notes (Signed)
[place] OFFICE PROGRESS NOTE  Latoya Market, MD DIAGNOSIS:  Chronic anemia, anemia due to chronic renal failure and monoclonal gammopathy of unknown significance  SUMMARY OF HEMATOLOGIC HISTORY: This is a pleasant 63 year old lady, had chronic kidney disease, monoclonal gammopathy of unknown significance, [name], [name] & 2015, she had received blood transfusion for severe anemia INTERVAL HISTORY: [name] 63 y.o. female returns for further followup. She does not feel better after her last blood transfusion. She has been seen by GI service with plans for upper endoscopy. Previously she was having some epigastric pain and melana. She also had history of epistaxis. Since the last time I saw her, she has no further recurrence of bleeding.  I have reviewed the past medical history, past surgical history, [name]; codeine phosphate; hydralazine hcl; penicillins; and sulfa antibiotics.  MEDICATIONS:  Current Outpatient Prescriptions  Medication Sig Dispense Refill  . ALPRAZolam (XANAX) 1 MG tablet 1 tab by mouth 3 times a day as needed for anxiety  90 tablet  0  . aspirin 81 MG tablet Take 81 mg by mouth daily.        . Cholecalciferol (VITAMIN D-3 PO) Take 5,000 Int'l Units by mouth daily.        . clopidogrel (PLAVIX) 75 MG tablet Take 75 mg by mouth daily.        Marland Kitchen dicyclomine (BENTYL) 10 MG capsule Take 10 mg by mouth 2 (two) times daily at 10 AM and 5 PM.       . FLUoxetine (PROZAC) 20 MG capsule Take 20 mg by mouth daily.      Marland Kitchen gabapentin (NEURONTIN) 300 MG capsule Take 600 mg by mouth 3 (three) times daily.       . hydrochlorothiazide (HYDRODIURIL) 12.5 MG tablet Take 25 mg by mouth daily.       . insulin aspart (NOVOLOG) 100 UNIT/ML injection Inject into the skin. Sliding scale      . lisinopril (PRINIVIL,ZESTRIL) 2.5 MG tablet Take 2.5 mg by mouth daily.      . nitroGLYCERIN (NITROSTAT) 0.4 MG SL tablet Place 0.4 mg under the tongue every 5 (five) minutes as needed.        . pantoprazole (PROTONIX) 40 MG tablet Take 40 mg by mouth daily.      . promethazine (PHENERGAN) 25 MG tablet Take 25 mg by mouth. by mouth every six hours as needed for nausea      . simvastatin (ZOCOR) 20 MG tablet Take 20 mg by mouth at bedtime.       . topiramate (TOPAMAX) 100 MG tablet Take 100 mg by mouth 2 (two) times daily.       . traMADol (ULTRAM) 50 MG tablet Take 50 mg by mouth every 8 (eight) hours as needed. Maximum dose= 8 tablets per day      . zolpidem (AMBIEN) 10 MG tablet Take 10 mg by mouth at bedtime as needed for sleep.      . insulin glargine (LANTUS) 100 UNIT/ML injection Inject 14 Units into the skin  every morning.       No current facility-administered medications for this visit.     REVIEW OF SYSTEMS:   Constitutional: Denies fevers, [name], nose, mouth, throat, [name], [name], chest discomfort or lower extremity swelling Gastrointestinal:  Denies nausea, heartburn or change in bowel habits Skin: Denies abnormal skin rashes Lymphatics: Denies new lymphadenopathy or easy bruising Neurological:Denies numbness, [name], no new changes  All other systems were reviewed with the patient and are negative.  PHYSICAL EXAMINATION: ECOG  PERFORMANCE STATUS: 0 - Asymptomatic  Filed Vitals:   02/08/14 1617  BP: 153/75  Pulse:   Temp:   Resp:    Filed Weights   02/08/14 1616  Weight: 122 lb 5 oz (55.481 kg)    GENERAL:alert, no distress and comfortable SKIN: skin color, texture, turgor are normal, [name], Conjunctiva are pink and non-injected, sclera clear Musculoskeletal:no cyanosis of digits and no clubbing  NEURO: alert & oriented x 3 with fluent speech, no focal motor/sensory deficits  LABORATORY DATA:  I have reviewed the data as listed No results found for this or any previous visit (from the past 48 hour(s)).  Lab Results  Component Value Date   WBC 4.2 02/05/2014   HGB 9.4* 02/05/2014   HCT 27.9* 02/05/2014   MCV 94.5 02/05/2014   PLT 170 02/05/2014   ASSESSMENT & PLAN:  #1 anemia chronic disease #2 chronic kidney disease #3 monoclonal gammopathy of unknown significance Her recent M spike appears to be stable. I suspect the anemia chronic disease is related to chronic kidney disease. Previously, she has been receiving Aranesp, 300 mcg every 5 weeks and that seemed to work well and prevent the need for blood transfusion. I recommend we resume erythropoietin stimulating agents today and to have it done every 5 weeks. The goal is to keep a hemoglobin above 10. #4 recent melena #5 history of peptic ulcer disease As do think she have recent upper GI bleed. I recommend she followup with gastroenterology for upper endoscopy to rule out peptic ulcer disease All questions were answered. The patient knows to call the clinic with any problems, questions or concerns. No barriers to learning was detected.  I spent 25 minutes counseling the patient face to face. The total time spent in the appointment was 40 minutes and more than 50% was on counseling.     [place], [place], MD 02/09/2014 7:42 AM

## 2014-02-10 ENCOUNTER — Telehealth: Payer: Self-pay | Admitting: Cardiovascular Disease

## 2014-02-10 NOTE — Telephone Encounter (Signed)
ROI faxed to [facility] at [phone]

## 2014-02-12 ENCOUNTER — Telehealth: Payer: Self-pay | Admitting: Cardiovascular Disease

## 2014-02-12 NOTE — Telephone Encounter (Signed)
Records rec From [facility], gave to Operator Room/ Jari Sportsman

## 2014-02-15 ENCOUNTER — Other Ambulatory Visit (INDEPENDENT_AMBULATORY_CARE_PROVIDER_SITE_OTHER): Payer: Medicaid Other

## 2014-02-15 DIAGNOSIS — D649 Anemia, unspecified: Secondary | ICD-10-CM

## 2014-02-15 LAB — HEMOCCULT SLIDES (X 3 CARDS)
FECAL OCCULT BLD: NEGATIVE
OCCULT 1: NEGATIVE
OCCULT 2: NEGATIVE
OCCULT 3: NEGATIVE
OCCULT 4: NEGATIVE
OCCULT 5: NEGATIVE

## 2014-02-17 ENCOUNTER — Telehealth: Payer: Self-pay | Admitting: *Deleted

## 2014-02-17 NOTE — Telephone Encounter (Signed)
I have spoken to Alonza Bogus, PA-C who saw patient on 02/01/14 and scheduled an endoscopy for patient. Per Dr Camillia Herter note on 02/09/14, patient should hold off on endoscopy until cardiology work up has been completed. Therefore, Janett Billow has asked that procedure be cancelled at this time and patient contact us once cardiology workup has been completed. I have attempted to contact patient. However, there is no response and no voicemail at the number listed in her chart. I will attempt to contact her again at a later time.

## 2014-02-17 NOTE — Telephone Encounter (Signed)
Message copied by Larina Bras on Wed Feb 17, 2014 11:35 AM ------      Message from: Audrea Muscat      Created: Tue Feb 02, 2014  9:02 AM       Send anticoag letter to Dr. Milus Height ------

## 2014-02-18 NOTE — Telephone Encounter (Signed)
Patient states that her cousin, Bernadene Bell takes care of all of her appointments. She would like me to contact her. I have attempted to contact  Katharine Look but got no answer and no voicemail. I have contacted Ms Childrey again and have asked that she have Katharine Look to call back (her phone is [phone]).

## 2014-02-22 ENCOUNTER — Ambulatory Visit (HOSPITAL_COMMUNITY): Payer: Medicaid Other | Attending: Cardiovascular Disease | Admitting: Radiology

## 2014-02-22 DIAGNOSIS — R072 Precordial pain: Secondary | ICD-10-CM

## 2014-02-22 DIAGNOSIS — R0602 Shortness of breath: Secondary | ICD-10-CM

## 2014-02-22 DIAGNOSIS — I2510 Atherosclerotic heart disease of native coronary artery without angina pectoris: Secondary | ICD-10-CM | POA: Insufficient documentation

## 2014-02-22 DIAGNOSIS — R079 Chest pain, unspecified: Secondary | ICD-10-CM | POA: Insufficient documentation

## 2014-02-22 DIAGNOSIS — E119 Type 2 diabetes mellitus without complications: Secondary | ICD-10-CM | POA: Insufficient documentation

## 2014-02-22 DIAGNOSIS — R0609 Other forms of dyspnea: Secondary | ICD-10-CM | POA: Insufficient documentation

## 2014-02-22 DIAGNOSIS — R0989 Other specified symptoms and signs involving the circulatory and respiratory systems: Secondary | ICD-10-CM | POA: Insufficient documentation

## 2014-02-22 DIAGNOSIS — R0600 Dyspnea, unspecified: Secondary | ICD-10-CM

## 2014-02-22 DIAGNOSIS — E785 Hyperlipidemia, unspecified: Secondary | ICD-10-CM | POA: Insufficient documentation

## 2014-02-22 DIAGNOSIS — Z8673 Personal history of transient ischemic attack (TIA), and cerebral infarction without residual deficits: Secondary | ICD-10-CM | POA: Insufficient documentation

## 2014-02-22 NOTE — Progress Notes (Signed)
Echocardiogram performed.  

## 2014-02-23 NOTE — Telephone Encounter (Signed)
I have spoken to patient's caregiver, Katharine Look and have advised her of the below information. She verbalizes understanding that endoscopy on 03/17/14 will be cancelled for now and that they should call back once cardiac workup has been completed so we can reschedule.

## 2014-02-26 ENCOUNTER — Encounter (HOSPITAL_COMMUNITY): Payer: Medicaid Other

## 2014-02-26 ENCOUNTER — Ambulatory Visit: Payer: Self-pay | Admitting: Podiatry

## 2014-03-02 ENCOUNTER — Other Ambulatory Visit: Payer: Medicaid Other

## 2014-03-02 ENCOUNTER — Ambulatory Visit: Payer: Medicaid Other

## 2014-03-03 ENCOUNTER — Telehealth: Payer: Self-pay | Admitting: Cardiovascular Disease

## 2014-03-03 NOTE — Telephone Encounter (Signed)
Spoke with pt and reviewed echo results with her.

## 2014-03-03 NOTE — Telephone Encounter (Signed)
New message ° ° ° ° ° °Pt want echo results °

## 2014-03-04 ENCOUNTER — Telehealth: Payer: Self-pay | Admitting: Hematology and Oncology

## 2014-03-04 NOTE — Telephone Encounter (Signed)
Pt called and made appt for 3/11 for aranesp, ok per Dr. Alvy Bimler, pt did not make any md appt for June due to ride

## 2014-03-09 ENCOUNTER — Ambulatory Visit: Payer: Medicaid Other | Admitting: Cardiovascular Disease

## 2014-03-10 ENCOUNTER — Other Ambulatory Visit: Payer: Medicaid Other

## 2014-03-10 ENCOUNTER — Ambulatory Visit: Payer: Medicaid Other

## 2014-03-16 ENCOUNTER — Encounter: Payer: Self-pay | Admitting: *Deleted

## 2014-03-17 ENCOUNTER — Encounter: Payer: Medicaid Other | Admitting: Internal Medicine

## 2014-03-17 ENCOUNTER — Encounter: Payer: Self-pay | Admitting: Internal Medicine

## 2014-04-02 NOTE — Telephone Encounter (Signed)
ERROR-SCHEDULER DID NOT CX APPT/YF

## 2014-04-06 ENCOUNTER — Encounter (HOSPITAL_COMMUNITY): Payer: Medicaid Other

## 2014-04-06 ENCOUNTER — Other Ambulatory Visit: Payer: Medicaid Other

## 2014-04-06 ENCOUNTER — Ambulatory Visit: Payer: Medicaid Other

## 2014-04-13 ENCOUNTER — Ambulatory Visit: Payer: Medicaid Other | Admitting: Cardiovascular Disease

## 2014-04-16 ENCOUNTER — Ambulatory Visit: Payer: Medicaid Other | Admitting: Cardiovascular Disease

## 2014-05-04 ENCOUNTER — Emergency Department: Payer: Self-pay | Admitting: Emergency Medicine

## 2014-05-04 LAB — URINALYSIS, COMPLETE
Bilirubin,UR: NEGATIVE
Glucose,UR: >=500 mg/dL (ref 0–75)
KETONE: NEGATIVE
NITRITE: NEGATIVE
Ph: 5 (ref 4.5–8.0)
Protein: NEGATIVE
Specific Gravity: 1.01 (ref 1.003–1.030)
Squamous Epithelial: NONE SEEN

## 2014-05-04 LAB — COMPREHENSIVE METABOLIC PANEL
Albumin: 3.2 g/dL — ABNORMAL LOW (ref 3.4–5.0)
Alkaline Phosphatase: 44 U/L — ABNORMAL LOW
Anion Gap: 7 (ref 7–16)
BILIRUBIN TOTAL: 0.4 mg/dL (ref 0.2–1.0)
BUN: 48 mg/dL — AB (ref 7–18)
CHLORIDE: 109 mmol/L — AB (ref 98–107)
CREATININE: 2.01 mg/dL — AB (ref 0.60–1.30)
Calcium, Total: 8.9 mg/dL (ref 8.5–10.1)
Co2: 23 mmol/L (ref 21–32)
EGFR (African American): 30 — ABNORMAL LOW
EGFR (Non-African Amer.): 26 — ABNORMAL LOW
Glucose: 240 mg/dL — ABNORMAL HIGH (ref 65–99)
Osmolality: 298 (ref 275–301)
POTASSIUM: 4.1 mmol/L (ref 3.5–5.1)
SGOT(AST): 15 U/L (ref 15–37)
SGPT (ALT): 17 U/L (ref 12–78)
Sodium: 139 mmol/L (ref 136–145)
Total Protein: 6.3 g/dL — ABNORMAL LOW (ref 6.4–8.2)

## 2014-05-04 LAB — CBC
HCT: 23.4 % — ABNORMAL LOW (ref 35.0–47.0)
HGB: 7.7 g/dL — ABNORMAL LOW (ref 12.0–16.0)
MCH: 32.3 pg (ref 26.0–34.0)
MCHC: 33 g/dL (ref 32.0–36.0)
MCV: 98 fL (ref 80–100)
Platelet: 153 10*3/uL (ref 150–440)
RBC: 2.39 10*6/uL — AB (ref 3.80–5.20)
RDW: 13.5 % (ref 11.5–14.5)
WBC: 7.2 10*3/uL (ref 3.6–11.0)

## 2014-05-10 ENCOUNTER — Encounter: Payer: Self-pay | Admitting: Surgery

## 2014-05-11 ENCOUNTER — Other Ambulatory Visit: Payer: Medicaid Other

## 2014-05-11 ENCOUNTER — Ambulatory Visit: Payer: Medicaid Other

## 2014-05-13 ENCOUNTER — Ambulatory Visit: Payer: Medicaid Other | Admitting: Podiatrist

## 2014-05-14 LAB — WOUND AEROBIC CULTURE

## 2014-05-25 ENCOUNTER — Telehealth: Payer: Self-pay | Admitting: *Deleted

## 2014-05-25 ENCOUNTER — Other Ambulatory Visit: Payer: Self-pay | Admitting: Hematology and Oncology

## 2014-05-25 ENCOUNTER — Telehealth: Payer: Self-pay | Admitting: Hematology and Oncology

## 2014-05-25 DIAGNOSIS — D649 Anemia, unspecified: Secondary | ICD-10-CM

## 2014-05-25 DIAGNOSIS — N189 Chronic kidney disease, unspecified: Secondary | ICD-10-CM

## 2014-05-25 NOTE — Telephone Encounter (Signed)
, °

## 2014-05-25 NOTE — Telephone Encounter (Signed)
I placed POF for lab and inj tomorrow and same see me in 1 month We will order blood depending on what blood counts look like Make sure she is here early tomorrow Thanks

## 2014-05-25 NOTE — Telephone Encounter (Signed)
Pt reports she is feeling dizzy, weak, nausea and blurry vision.  She states had her blood checked at [place] last month and her hgb was 7.4.  Says she did not get transfused at that time and blood has not been checked since.  She has missed her last 3 months appts for lab and injection here. She canceled d/t lack of transportation.  She also has been going to wound center often for diabetic wound/ infection in her foot.  She asks if she can come here on Wed this week for lab/transfusion?

## 2014-05-26 ENCOUNTER — Telehealth: Payer: Self-pay | Admitting: *Deleted

## 2014-05-26 ENCOUNTER — Other Ambulatory Visit: Payer: Medicaid Other

## 2014-05-26 ENCOUNTER — Ambulatory Visit: Payer: Medicaid Other

## 2014-05-26 NOTE — Telephone Encounter (Signed)
Pt called to report she doesn't think she will make her appt today for lab/injection.  She does not have any transportation. Pt c/o feeling very fatigued, faint, dizzy and her hands and feet are tingling. She is concerned stating her Hgb last month at [facility] was 7.4.  Pt thinks she needs a transfusion. She is still trying to find a way to get here.  Instructed pt she may need to call 911 and go to ED for these symptoms and concern for anemia.  Instructed her to call us back if she finds a ride and can get to our lab by 4 pm or call 911 to go to ED.[name] She verbalized understanding.

## 2014-05-31 ENCOUNTER — Telehealth: Payer: Self-pay | Admitting: Hematology and Oncology

## 2014-05-31 NOTE — Telephone Encounter (Signed)
FAXED [facility] [phone]

## 2014-06-03 ENCOUNTER — Ambulatory Visit: Payer: Self-pay | Admitting: Oncology

## 2014-06-03 LAB — CBC CANCER CENTER
BASOS ABS: 0 x10 3/mm (ref 0.0–0.1)
BASOS PCT: 0.6 %
EOS PCT: 3.4 %
Eosinophil #: 0.2 x10 3/mm (ref 0.0–0.7)
HCT: 23.2 % — ABNORMAL LOW (ref 35.0–47.0)
HGB: 7.5 g/dL — ABNORMAL LOW (ref 12.0–16.0)
LYMPHS ABS: 1.3 x10 3/mm (ref 1.0–3.6)
Lymphocyte %: 30.1 %
MCH: 31.9 pg (ref 26.0–34.0)
MCHC: 32.5 g/dL (ref 32.0–36.0)
MCV: 98 fL (ref 80–100)
Monocyte #: 0.3 x10 3/mm (ref 0.2–0.9)
Monocyte %: 5.8 %
NEUTROS PCT: 60.1 %
Neutrophil #: 2.7 x10 3/mm (ref 1.4–6.5)
Platelet: 146 x10 3/mm — ABNORMAL LOW (ref 150–440)
RBC: 2.36 10*6/uL — AB (ref 3.80–5.20)
RDW: 13.6 % (ref 11.5–14.5)
WBC: 4.4 x10 3/mm (ref 3.6–11.0)

## 2014-06-03 LAB — IRON AND TIBC
IRON SATURATION: 40 %
Iron Bind.Cap.(Total): 345 ug/dL (ref 250–450)
Iron: 137 ug/dL (ref 50–170)
UNBOUND IRON-BIND. CAP.: 208 ug/dL

## 2014-06-03 LAB — RETICULOCYTES
Absolute Retic Count: 0.0496 10*6/uL (ref 0.019–0.186)
Reticulocyte: 2.11 % (ref 0.4–3.1)

## 2014-06-03 LAB — FERRITIN: Ferritin (ARMC): 659 ng/mL — ABNORMAL HIGH (ref 8–388)

## 2014-06-03 LAB — LACTATE DEHYDROGENASE: LDH: 212 U/L (ref 81–246)

## 2014-06-03 LAB — FOLATE: Folic Acid: 19.9 ng/mL (ref 3.1–100.0)

## 2014-06-07 LAB — PROT IMMUNOELECTROPHORES(ARMC)

## 2014-06-10 ENCOUNTER — Ambulatory Visit (INDEPENDENT_AMBULATORY_CARE_PROVIDER_SITE_OTHER): Payer: Medicaid Other | Admitting: Podiatrist

## 2014-06-10 ENCOUNTER — Telehealth: Payer: Self-pay | Admitting: Podiatrist

## 2014-06-10 ENCOUNTER — Encounter: Payer: Self-pay | Admitting: Podiatrist

## 2014-06-10 VITALS — BP 110/45 | HR 77 | Resp 16

## 2014-06-10 DIAGNOSIS — G589 Mononeuropathy, unspecified: Secondary | ICD-10-CM

## 2014-06-10 DIAGNOSIS — M216X9 Other acquired deformities of unspecified foot: Secondary | ICD-10-CM

## 2014-06-10 DIAGNOSIS — Q72899 Other reduction defects of unspecified lower limb: Secondary | ICD-10-CM

## 2014-06-10 DIAGNOSIS — Q828 Other specified congenital malformations of skin: Secondary | ICD-10-CM

## 2014-06-10 DIAGNOSIS — E1140 Type 2 diabetes mellitus with diabetic neuropathy, unspecified: Secondary | ICD-10-CM

## 2014-06-10 DIAGNOSIS — M79609 Pain in unspecified limb: Secondary | ICD-10-CM

## 2014-06-10 DIAGNOSIS — B351 Tinea unguium: Secondary | ICD-10-CM

## 2014-06-10 DIAGNOSIS — G629 Polyneuropathy, unspecified: Secondary | ICD-10-CM

## 2014-06-10 DIAGNOSIS — E1149 Type 2 diabetes mellitus with other diabetic neurological complication: Secondary | ICD-10-CM

## 2014-06-10 MED ORDER — NAFTIFINE HCL 2 % EX CREA: 1.0000 "application " | TOPICAL_CREAM | CUTANEOUS | Status: DC

## 2014-06-10 NOTE — Progress Notes (Signed)
   Subjective:    Patient ID: [name], female    DOB: Sep 12, 1951, 63 y.o.   MRN: [id number]  HPI Comments: i need my toenails trimmed and my callus. My feet are dry. Toenails and callus hurt. Hurts to walk. My feet have been like this for 2 - 3 yrs and longer. i trim my toenails and callus.     Review of Systems  Constitutional: Positive for activity change, appetite change and fatigue.  HENT: Positive for hearing loss, sinus pressure, sore throat and trouble swallowing.        Ringing in ears  Eyes: Positive for itching and visual disturbance.  Respiratory: Positive for cough.        Difficulty breathing  Cardiovascular: Positive for chest pain.  Gastrointestinal: Positive for nausea, vomiting and diarrhea.  Skin:       Change in nails  Hematological: Bruises/bleeds easily.  All other systems reviewed and are negative.      Objective:   Physical Exam Patient is awake, alert, and oriented x 3.  In no acute distress.  Vascular status is intact with palpable pedal pulses at 2/4 DP and PT bilateral and capillary refill time within normal limits. Neurological sensation is also intact bilaterally via Semmes Weinstein monofilament at 5/5 sites. Light touch, vibratory sensation, Achilles tendon reflex is intact.  Musculature intact with dorsiflexion, plantarflexion, inversion, eversion.  Hallux nails are fungal.  She complains of an odor to her feet due to the nails and cramped toes.  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  Ulceration submet 1 of the left foot appears stable and healing.  Callus present submet 5 left foot.     Assessment & Plan:  Symptomatic onychomycosis, prominent metatarsals, porokeratotic lesion,  Ulcer, brachymetatarsia bilateral  Plan:  Debridement of lesions carried out today without complication.  She will be seen back for routine care.  rx for naftin cream written to help with foot odor and fungus.

## 2014-06-10 NOTE — Patient Instructions (Signed)
Your insurance will not pay for the topical nail medication for your big toes-- I recommend you use over the counter Kerasal or Funginail.

## 2014-06-10 NOTE — Telephone Encounter (Signed)
PT CALLED TO STATE THE THE PHARM STATED THAT THEY DIDN'T RECEIVE BOTH CREAM RX'S. THEY NEEDED THE SECOND CREAM TO BE CALLED IN. PT DIDN'T KNOW WHICH ONE IT WAS. PLS CALL PATIENT TO LET HER KNOW THAT THE CREAM HAS BEEN CALLED IN.

## 2014-06-15 ENCOUNTER — Ambulatory Visit: Payer: Medicaid Other

## 2014-06-15 ENCOUNTER — Ambulatory Visit: Payer: Medicaid Other | Admitting: Hematology and Oncology

## 2014-06-15 ENCOUNTER — Other Ambulatory Visit: Payer: Medicaid Other

## 2014-06-24 ENCOUNTER — Other Ambulatory Visit: Payer: Medicaid Other

## 2014-06-24 ENCOUNTER — Ambulatory Visit: Payer: Medicaid Other

## 2014-06-24 ENCOUNTER — Ambulatory Visit: Payer: Medicaid Other | Admitting: Hematology and Oncology

## 2014-06-25 ENCOUNTER — Other Ambulatory Visit: Payer: Self-pay | Admitting: Podiatrist

## 2014-06-25 MED ORDER — KETOCONAZOLE 2 % EX CREA: 1.0000 "application " | TOPICAL_CREAM | Freq: Every day | CUTANEOUS | Status: DC

## 2014-06-30 ENCOUNTER — Ambulatory Visit: Payer: Self-pay | Admitting: Oncology

## 2014-07-01 LAB — CBC CANCER CENTER
Basophil #: 0 x10 3/mm (ref 0.0–0.1)
Basophil %: 0.4 %
EOS ABS: 0.1 x10 3/mm (ref 0.0–0.7)
Eosinophil %: 3 %
HCT: 26.1 % — ABNORMAL LOW (ref 35.0–47.0)
HGB: 8.6 g/dL — AB (ref 12.0–16.0)
Lymphocyte #: 0.8 x10 3/mm — ABNORMAL LOW (ref 1.0–3.6)
Lymphocyte %: 25.2 %
MCH: 32.9 pg (ref 26.0–34.0)
MCHC: 32.9 g/dL (ref 32.0–36.0)
MCV: 100 fL (ref 80–100)
MONOS PCT: 5.8 %
Monocyte #: 0.2 x10 3/mm (ref 0.2–0.9)
NEUTROS ABS: 2.2 x10 3/mm (ref 1.4–6.5)
Neutrophil %: 65.6 %
Platelet: 144 x10 3/mm — ABNORMAL LOW (ref 150–440)
RBC: 2.6 10*6/uL — AB (ref 3.80–5.20)
RDW: 12.8 % (ref 11.5–14.5)
WBC: 3.3 x10 3/mm — ABNORMAL LOW (ref 3.6–11.0)

## 2014-07-31 ENCOUNTER — Ambulatory Visit: Payer: Self-pay | Admitting: Oncology

## 2014-09-28 ENCOUNTER — Ambulatory Visit: Payer: Self-pay | Admitting: Oncology

## 2014-09-28 LAB — BASIC METABOLIC PANEL
Anion Gap: 5 — ABNORMAL LOW (ref 7–16)
BUN: 36 mg/dL — ABNORMAL HIGH (ref 7–18)
Calcium, Total: 8.2 mg/dL — ABNORMAL LOW (ref 8.5–10.1)
Chloride: 112 mmol/L — ABNORMAL HIGH (ref 98–107)
Co2: 24 mmol/L (ref 21–32)
Creatinine: 2 mg/dL — ABNORMAL HIGH (ref 0.60–1.30)
EGFR (African American): 32 — ABNORMAL LOW
GFR CALC NON AF AMER: 27 — AB
Glucose: 355 mg/dL — ABNORMAL HIGH (ref 65–99)
OSMOLALITY: 304 (ref 275–301)
POTASSIUM: 4.9 mmol/L (ref 3.5–5.1)
Sodium: 141 mmol/L (ref 136–145)

## 2014-09-28 LAB — CBC CANCER CENTER
BASOS ABS: 0 x10 3/mm (ref 0.0–0.1)
BASOS PCT: 0.7 %
EOS ABS: 0.2 x10 3/mm (ref 0.0–0.7)
Eosinophil %: 4 %
HCT: 21.7 % — ABNORMAL LOW (ref 35.0–47.0)
HGB: 7 g/dL — ABNORMAL LOW (ref 12.0–16.0)
LYMPHS PCT: 23.5 %
Lymphocyte #: 1 x10 3/mm (ref 1.0–3.6)
MCH: 31.8 pg (ref 26.0–34.0)
MCHC: 32.2 g/dL (ref 32.0–36.0)
MCV: 99 fL (ref 80–100)
MONO ABS: 0.3 x10 3/mm (ref 0.2–0.9)
MONOS PCT: 7.3 %
NEUTROS PCT: 64.5 %
Neutrophil #: 2.6 x10 3/mm (ref 1.4–6.5)
Platelet: 185 x10 3/mm (ref 150–440)
RBC: 2.2 10*6/uL — AB (ref 3.80–5.20)
RDW: 16.2 % — ABNORMAL HIGH (ref 11.5–14.5)
WBC: 4.1 x10 3/mm (ref 3.6–11.0)

## 2014-09-30 ENCOUNTER — Ambulatory Visit: Payer: Self-pay | Admitting: Oncology

## 2014-10-28 LAB — CBC CANCER CENTER
BASOS ABS: 0 x10 3/mm (ref 0.0–0.1)
Basophil %: 0.5 %
EOS ABS: 0.1 x10 3/mm (ref 0.0–0.7)
Eosinophil %: 3.8 %
HCT: 27.4 % — ABNORMAL LOW (ref 35.0–47.0)
HGB: 8.8 g/dL — ABNORMAL LOW (ref 12.0–16.0)
Lymphocyte #: 0.7 x10 3/mm — ABNORMAL LOW (ref 1.0–3.6)
Lymphocyte %: 20.4 %
MCH: 32.3 pg (ref 26.0–34.0)
MCHC: 32.2 g/dL (ref 32.0–36.0)
MCV: 100 fL (ref 80–100)
MONO ABS: 0.2 x10 3/mm (ref 0.2–0.9)
Monocyte %: 6.1 %
NEUTROS PCT: 69.2 %
Neutrophil #: 2.4 x10 3/mm (ref 1.4–6.5)
PLATELETS: 121 x10 3/mm — AB (ref 150–440)
RBC: 2.73 10*6/uL — AB (ref 3.80–5.20)
RDW: 14.3 % (ref 11.5–14.5)
WBC: 3.5 x10 3/mm — AB (ref 3.6–11.0)

## 2014-10-31 ENCOUNTER — Ambulatory Visit: Payer: Self-pay | Admitting: Oncology

## 2015-02-03 ENCOUNTER — Ambulatory Visit: Payer: Self-pay | Admitting: Oncology

## 2015-02-03 DIAGNOSIS — F419 Anxiety disorder, unspecified: Secondary | ICD-10-CM | POA: Diagnosis not present

## 2015-02-03 DIAGNOSIS — R531 Weakness: Secondary | ICD-10-CM | POA: Diagnosis not present

## 2015-02-03 DIAGNOSIS — G629 Polyneuropathy, unspecified: Secondary | ICD-10-CM | POA: Diagnosis not present

## 2015-02-03 DIAGNOSIS — I2510 Atherosclerotic heart disease of native coronary artery without angina pectoris: Secondary | ICD-10-CM | POA: Diagnosis not present

## 2015-02-03 DIAGNOSIS — R079 Chest pain, unspecified: Secondary | ICD-10-CM | POA: Diagnosis not present

## 2015-02-03 DIAGNOSIS — F329 Major depressive disorder, single episode, unspecified: Secondary | ICD-10-CM | POA: Diagnosis not present

## 2015-02-03 DIAGNOSIS — Z794 Long term (current) use of insulin: Secondary | ICD-10-CM | POA: Diagnosis not present

## 2015-02-03 DIAGNOSIS — R0602 Shortness of breath: Secondary | ICD-10-CM | POA: Diagnosis not present

## 2015-02-03 DIAGNOSIS — I129 Hypertensive chronic kidney disease with stage 1 through stage 4 chronic kidney disease, or unspecified chronic kidney disease: Secondary | ICD-10-CM | POA: Diagnosis not present

## 2015-02-03 DIAGNOSIS — Z7982 Long term (current) use of aspirin: Secondary | ICD-10-CM | POA: Diagnosis not present

## 2015-02-03 DIAGNOSIS — R5383 Other fatigue: Secondary | ICD-10-CM | POA: Diagnosis not present

## 2015-02-03 DIAGNOSIS — Z79899 Other long term (current) drug therapy: Secondary | ICD-10-CM | POA: Diagnosis not present

## 2015-02-03 DIAGNOSIS — D472 Monoclonal gammopathy: Secondary | ICD-10-CM | POA: Diagnosis not present

## 2015-02-03 DIAGNOSIS — N189 Chronic kidney disease, unspecified: Secondary | ICD-10-CM | POA: Diagnosis not present

## 2015-02-03 DIAGNOSIS — E119 Type 2 diabetes mellitus without complications: Secondary | ICD-10-CM | POA: Diagnosis not present

## 2015-02-03 DIAGNOSIS — K219 Gastro-esophageal reflux disease without esophagitis: Secondary | ICD-10-CM | POA: Diagnosis not present

## 2015-02-03 DIAGNOSIS — E780 Pure hypercholesterolemia: Secondary | ICD-10-CM | POA: Diagnosis not present

## 2015-02-03 DIAGNOSIS — D631 Anemia in chronic kidney disease: Secondary | ICD-10-CM | POA: Diagnosis not present

## 2015-02-03 LAB — CBC CANCER CENTER
Basophil #: 0 x10 3/mm (ref 0.0–0.1)
Basophil %: 0.4 %
EOS ABS: 0.1 x10 3/mm (ref 0.0–0.7)
EOS PCT: 3.6 %
HCT: 22.9 % — AB (ref 35.0–47.0)
HGB: 7.5 g/dL — AB (ref 12.0–16.0)
LYMPHS PCT: 22.8 %
Lymphocyte #: 0.9 x10 3/mm — ABNORMAL LOW (ref 1.0–3.6)
MCH: 31.7 pg (ref 26.0–34.0)
MCHC: 32.5 g/dL (ref 32.0–36.0)
MCV: 98 fL (ref 80–100)
MONOS PCT: 6.6 %
Monocyte #: 0.3 x10 3/mm (ref 0.2–0.9)
Neutrophil #: 2.7 x10 3/mm (ref 1.4–6.5)
Neutrophil %: 66.6 %
Platelet: 180 x10 3/mm (ref 150–440)
RBC: 2.35 10*6/uL — AB (ref 3.80–5.20)
RDW: 14.7 % — ABNORMAL HIGH (ref 11.5–14.5)
WBC: 4 x10 3/mm (ref 3.6–11.0)

## 2015-02-03 LAB — IRON AND TIBC
IRON SATURATION: 32 %
Iron Bind.Cap.(Total): 299 ug/dL (ref 250–450)
Iron: 97 ug/dL (ref 50–170)
Unbound Iron-Bind.Cap.: 202 ug/dL

## 2015-02-03 LAB — FERRITIN: Ferritin (ARMC): 844 ng/mL — ABNORMAL HIGH (ref 8–388)

## 2015-02-03 LAB — FOLATE: Folic Acid: 12.2 ng/mL (ref 3.1–17.5)

## 2015-02-08 DIAGNOSIS — I2510 Atherosclerotic heart disease of native coronary artery without angina pectoris: Secondary | ICD-10-CM | POA: Diagnosis not present

## 2015-02-08 DIAGNOSIS — E780 Pure hypercholesterolemia: Secondary | ICD-10-CM | POA: Diagnosis not present

## 2015-02-08 DIAGNOSIS — R079 Chest pain, unspecified: Secondary | ICD-10-CM | POA: Diagnosis not present

## 2015-02-08 DIAGNOSIS — Z794 Long term (current) use of insulin: Secondary | ICD-10-CM | POA: Diagnosis not present

## 2015-02-08 DIAGNOSIS — Z7982 Long term (current) use of aspirin: Secondary | ICD-10-CM | POA: Diagnosis not present

## 2015-02-08 DIAGNOSIS — F419 Anxiety disorder, unspecified: Secondary | ICD-10-CM | POA: Diagnosis not present

## 2015-02-08 DIAGNOSIS — E119 Type 2 diabetes mellitus without complications: Secondary | ICD-10-CM | POA: Diagnosis not present

## 2015-02-08 DIAGNOSIS — I129 Hypertensive chronic kidney disease with stage 1 through stage 4 chronic kidney disease, or unspecified chronic kidney disease: Secondary | ICD-10-CM | POA: Diagnosis not present

## 2015-02-08 DIAGNOSIS — R531 Weakness: Secondary | ICD-10-CM | POA: Diagnosis not present

## 2015-02-08 DIAGNOSIS — R5383 Other fatigue: Secondary | ICD-10-CM | POA: Diagnosis not present

## 2015-02-08 DIAGNOSIS — Z79899 Other long term (current) drug therapy: Secondary | ICD-10-CM | POA: Diagnosis not present

## 2015-02-08 DIAGNOSIS — D472 Monoclonal gammopathy: Secondary | ICD-10-CM | POA: Diagnosis not present

## 2015-02-08 DIAGNOSIS — R0602 Shortness of breath: Secondary | ICD-10-CM | POA: Diagnosis not present

## 2015-02-08 DIAGNOSIS — D631 Anemia in chronic kidney disease: Secondary | ICD-10-CM | POA: Diagnosis not present

## 2015-02-08 DIAGNOSIS — G629 Polyneuropathy, unspecified: Secondary | ICD-10-CM | POA: Diagnosis not present

## 2015-02-08 DIAGNOSIS — N189 Chronic kidney disease, unspecified: Secondary | ICD-10-CM | POA: Diagnosis not present

## 2015-02-08 DIAGNOSIS — F329 Major depressive disorder, single episode, unspecified: Secondary | ICD-10-CM | POA: Diagnosis not present

## 2015-02-08 DIAGNOSIS — K219 Gastro-esophageal reflux disease without esophagitis: Secondary | ICD-10-CM | POA: Diagnosis not present

## 2015-02-28 ENCOUNTER — Telehealth: Payer: Self-pay | Admitting: Internal Medicine

## 2015-02-28 NOTE — Telephone Encounter (Signed)
Noted, thank you

## 2015-02-28 NOTE — Telephone Encounter (Signed)
She will need an appt for med refill prior to new pt appt. 30 minute appt

## 2015-02-28 NOTE — Telephone Encounter (Signed)
When I called patient I found out she has Medicaid [place] Access.  I told her we won't be able to see her and suggested she go to Urgent Care for her refills or back to Dr.Neimeyer.

## 2015-02-28 NOTE — Telephone Encounter (Signed)
Patient has a new patient appointment with you on 05/05/15.  Patient said her doctor won't refill her medication for another month.  Patient will run out of her medication this week.  Patient's on blood thinner,blood pressure medication and several others.  Patient wants to know if you can refill her medication.

## 2015-03-01 ENCOUNTER — Ambulatory Visit
Admit: 2015-03-01 | Disposition: A | Discharge status: Home / Self Care | Payer: Self-pay | Attending: Oncology | Admitting: Oncology

## 2015-03-02 ENCOUNTER — Ambulatory Visit: Payer: Medicaid Other | Admitting: Internal Medicine

## 2015-03-25 DIAGNOSIS — R32 Unspecified urinary incontinence: Secondary | ICD-10-CM | POA: Diagnosis not present

## 2015-03-25 DIAGNOSIS — E1310 Other specified diabetes mellitus with ketoacidosis without coma: Secondary | ICD-10-CM | POA: Diagnosis not present

## 2015-03-25 DIAGNOSIS — I259 Chronic ischemic heart disease, unspecified: Secondary | ICD-10-CM | POA: Diagnosis not present

## 2015-03-25 DIAGNOSIS — E139 Other specified diabetes mellitus without complications: Secondary | ICD-10-CM | POA: Diagnosis not present

## 2015-03-31 DIAGNOSIS — E780 Pure hypercholesterolemia: Secondary | ICD-10-CM | POA: Diagnosis not present

## 2015-03-31 DIAGNOSIS — R5383 Other fatigue: Secondary | ICD-10-CM | POA: Diagnosis not present

## 2015-03-31 DIAGNOSIS — Z0000 Encounter for general adult medical examination without abnormal findings: Secondary | ICD-10-CM | POA: Diagnosis not present

## 2015-03-31 DIAGNOSIS — E119 Type 2 diabetes mellitus without complications: Secondary | ICD-10-CM | POA: Diagnosis not present

## 2015-03-31 DIAGNOSIS — N390 Urinary tract infection, site not specified: Secondary | ICD-10-CM | POA: Diagnosis not present

## 2015-03-31 DIAGNOSIS — E784 Other hyperlipidemia: Secondary | ICD-10-CM | POA: Diagnosis not present

## 2015-03-31 DIAGNOSIS — E559 Vitamin D deficiency, unspecified: Secondary | ICD-10-CM | POA: Diagnosis not present

## 2015-03-31 DIAGNOSIS — I10 Essential (primary) hypertension: Secondary | ICD-10-CM | POA: Diagnosis not present

## 2015-04-05 ENCOUNTER — Ambulatory Visit
Admit: 2015-04-05 | Disposition: A | Discharge status: Home / Self Care | Payer: Self-pay | Attending: Oncology | Admitting: Oncology

## 2015-05-03 ENCOUNTER — Other Ambulatory Visit: Payer: Medicaid Other

## 2015-05-03 ENCOUNTER — Ambulatory Visit: Payer: Medicaid Other

## 2015-05-05 ENCOUNTER — Ambulatory Visit: Payer: Medicaid Other | Admitting: Internal Medicine

## 2015-05-16 ENCOUNTER — Other Ambulatory Visit: Payer: Self-pay | Admitting: Oncology

## 2015-05-17 ENCOUNTER — Other Ambulatory Visit: Payer: Medicaid Other

## 2015-05-17 ENCOUNTER — Inpatient Hospital Stay: Payer: Medicaid Other

## 2015-05-26 ENCOUNTER — Inpatient Hospital Stay: Payer: Commercial Managed Care - HMO | Attending: Oncology

## 2015-05-26 ENCOUNTER — Inpatient Hospital Stay: Payer: Commercial Managed Care - HMO

## 2015-05-26 ENCOUNTER — Encounter (INDEPENDENT_AMBULATORY_CARE_PROVIDER_SITE_OTHER): Payer: Self-pay

## 2015-05-26 DIAGNOSIS — D631 Anemia in chronic kidney disease: Secondary | ICD-10-CM

## 2015-05-26 DIAGNOSIS — N179 Acute kidney failure, unspecified: Secondary | ICD-10-CM

## 2015-05-26 DIAGNOSIS — D649 Anemia, unspecified: Secondary | ICD-10-CM

## 2015-05-26 DIAGNOSIS — N189 Chronic kidney disease, unspecified: Secondary | ICD-10-CM | POA: Insufficient documentation

## 2015-05-26 LAB — HEMOGLOBIN: Hemoglobin: 7.3 g/dL — ABNORMAL LOW (ref 12.0–16.0)

## 2015-05-26 MED ORDER — EPOETIN ALFA 40000 UNIT/ML IJ SOLN: 40000.0000 [IU] | Freq: Once | INTRAMUSCULAR | Status: DC

## 2015-05-27 ENCOUNTER — Inpatient Hospital Stay: Payer: Commercial Managed Care - HMO

## 2015-05-27 VITALS — BP 103/65 | HR 78

## 2015-05-27 DIAGNOSIS — D631 Anemia in chronic kidney disease: Secondary | ICD-10-CM | POA: Diagnosis not present

## 2015-05-27 DIAGNOSIS — N189 Chronic kidney disease, unspecified: Principal | ICD-10-CM

## 2015-05-27 DIAGNOSIS — D649 Anemia, unspecified: Secondary | ICD-10-CM

## 2015-05-27 DIAGNOSIS — N179 Acute kidney failure, unspecified: Secondary | ICD-10-CM

## 2015-05-27 MED ORDER — EPOETIN ALFA 40000 UNIT/ML IJ SOLN
40000.0000 [IU] | Freq: Once | INTRAMUSCULAR | Status: AC
  Administered 2015-05-27: 40000 [IU] via SUBCUTANEOUS
  Filled 2015-05-27: qty 1

## 2015-05-31 ENCOUNTER — Other Ambulatory Visit: Payer: Medicaid Other

## 2015-05-31 ENCOUNTER — Ambulatory Visit: Payer: Medicaid Other

## 2015-05-31 ENCOUNTER — Ambulatory Visit: Payer: Medicaid Other | Admitting: Oncology

## 2015-06-20 ENCOUNTER — Other Ambulatory Visit: Payer: Self-pay

## 2015-06-20 ENCOUNTER — Emergency Department
Admission: EM | Admit: 2015-06-20 | Discharge: 2015-06-20 | Disposition: A | Discharge status: Home / Self Care | Payer: Commercial Managed Care - HMO | Attending: Emergency Medicine | Admitting: Emergency Medicine

## 2015-06-20 ENCOUNTER — Emergency Department: Payer: Commercial Managed Care - HMO

## 2015-06-20 DIAGNOSIS — E119 Type 2 diabetes mellitus without complications: Secondary | ICD-10-CM | POA: Diagnosis not present

## 2015-06-20 DIAGNOSIS — I129 Hypertensive chronic kidney disease with stage 1 through stage 4 chronic kidney disease, or unspecified chronic kidney disease: Secondary | ICD-10-CM | POA: Diagnosis not present

## 2015-06-20 DIAGNOSIS — N390 Urinary tract infection, site not specified: Secondary | ICD-10-CM | POA: Diagnosis not present

## 2015-06-20 DIAGNOSIS — I951 Orthostatic hypotension: Secondary | ICD-10-CM | POA: Diagnosis not present

## 2015-06-20 DIAGNOSIS — Z794 Long term (current) use of insulin: Secondary | ICD-10-CM | POA: Diagnosis not present

## 2015-06-20 DIAGNOSIS — Z7982 Long term (current) use of aspirin: Secondary | ICD-10-CM | POA: Insufficient documentation

## 2015-06-20 DIAGNOSIS — R42 Dizziness and giddiness: Secondary | ICD-10-CM | POA: Diagnosis present

## 2015-06-20 DIAGNOSIS — N189 Chronic kidney disease, unspecified: Secondary | ICD-10-CM | POA: Insufficient documentation

## 2015-06-20 DIAGNOSIS — Z79899 Other long term (current) drug therapy: Secondary | ICD-10-CM | POA: Diagnosis not present

## 2015-06-20 HISTORY — DX: Anemia, unspecified: D64.9

## 2015-06-20 HISTORY — DX: Encounter for other specified aftercare: Z51.89

## 2015-06-20 LAB — CBC
HEMATOCRIT: 25.8 % — AB (ref 35.0–47.0)
Hemoglobin: 8.3 g/dL — ABNORMAL LOW (ref 12.0–16.0)
MCH: 32.9 pg (ref 26.0–34.0)
MCHC: 32.2 g/dL (ref 32.0–36.0)
MCV: 102.2 fL — ABNORMAL HIGH (ref 80.0–100.0)
Platelets: 163 10*3/uL (ref 150–440)
RBC: 2.52 MIL/uL — ABNORMAL LOW (ref 3.80–5.20)
RDW: 14.2 % (ref 11.5–14.5)
WBC: 3.6 10*3/uL (ref 3.6–11.0)

## 2015-06-20 LAB — URINALYSIS COMPLETE WITH MICROSCOPIC (ARMC ONLY)
BILIRUBIN URINE: NEGATIVE
GLUCOSE, UA: NEGATIVE mg/dL
Hgb urine dipstick: NEGATIVE
Ketones, ur: NEGATIVE mg/dL
NITRITE: NEGATIVE
Protein, ur: NEGATIVE mg/dL
Specific Gravity, Urine: 1.008 (ref 1.005–1.030)
pH: 7 (ref 5.0–8.0)

## 2015-06-20 LAB — TYPE AND SCREEN
ABO/RH(D): O POS
Antibody Screen: NEGATIVE

## 2015-06-20 LAB — BASIC METABOLIC PANEL
Anion gap: 5 (ref 5–15)
BUN: 43 mg/dL — AB (ref 6–20)
CHLORIDE: 110 mmol/L (ref 101–111)
CO2: 24 mmol/L (ref 22–32)
Calcium: 8.8 mg/dL — ABNORMAL LOW (ref 8.9–10.3)
Creatinine, Ser: 2.04 mg/dL — ABNORMAL HIGH (ref 0.44–1.00)
GFR calc Af Amer: 28 mL/min — ABNORMAL LOW (ref >60)
GFR, EST NON AFRICAN AMERICAN: 24 mL/min — AB (ref >60)
Glucose, Bld: 125 mg/dL — ABNORMAL HIGH (ref 65–99)
Potassium: 4.7 mmol/L (ref 3.5–5.1)
Sodium: 139 mmol/L (ref 135–145)

## 2015-06-20 LAB — ABO/RH: ABO/RH(D): O POS

## 2015-06-20 LAB — TROPONIN I

## 2015-06-20 LAB — GLUCOSE, CAPILLARY
GLUCOSE-CAPILLARY: 40 mg/dL — AB (ref 65–99)
Glucose-Capillary: 122 mg/dL — ABNORMAL HIGH (ref 65–99)

## 2015-06-20 MED ORDER — LEVOFLOXACIN 750 MG PO TABS
ORAL_TABLET | ORAL | Status: AC
  Filled 2015-06-20: qty 1

## 2015-06-20 MED ORDER — LEVOFLOXACIN 750 MG PO TABS
750.0000 mg | ORAL_TABLET | Freq: Once | ORAL | Status: AC
  Administered 2015-06-20: 750 mg via ORAL

## 2015-06-20 MED ORDER — SODIUM CHLORIDE 0.9 % IV BOLUS (SEPSIS)
1000.0000 mL | Freq: Once | INTRAVENOUS | Status: AC
  Administered 2015-06-20: 1000 mL via INTRAVENOUS

## 2015-06-20 MED ORDER — LEVOFLOXACIN 750 MG PO TABS: 750.0000 mg | ORAL_TABLET | Freq: Every day | ORAL | Status: DC

## 2015-06-20 NOTE — Discharge Instructions (Signed)
You were evaluated for dizziness as feeling off-balance, and your exam and evaluation are reassuring today except your blood pressure drop on standing which we call orthostatic hypotension. This may be an indication of some dehydration. Make sure you drink plenty fluids. If symptoms did not seem classic or obvious for a stroke, and your head CT showed normal, however within the first 24 hours it may underestimate signs of a stroke. You need to see your doctor within 1-2 days for reevaluation. If you have persistent symptoms, you may need an additional brain imaging, MRI.  Return to the emergency department for any new or worsening condition including any passing out, altered mental status, one-sided weakness or numbness, fever, chest pain and/or shortness breath or any other symptoms concerning to you.  Dizziness Dizziness is a common problem. It is a feeling of unsteadiness or light-headedness. You may feel like you are about to faint. Dizziness can lead to injury if you stumble or fall. A person of any age group can suffer from dizziness, but dizziness is more common in older adults. CAUSES  Dizziness can be caused by many different things, including:  Middle ear problems.  Standing for too long.  Infections.  An allergic reaction.  Aging.  An emotional response to something, such as the sight of blood.  Side effects of medicines.  Tiredness.  Problems with circulation or blood pressure.  Excessive use of alcohol or medicines, or illegal drug use.  Breathing too fast (hyperventilation).  An irregular heart rhythm (arrhythmia).  A low red blood cell count (anemia).  Pregnancy.  Vomiting, diarrhea, fever, or other illnesses that cause body fluid loss (dehydration).  Diseases or conditions such as Parkinson's disease, high blood pressure (hypertension), diabetes, and thyroid problems.  Exposure to extreme heat. DIAGNOSIS  Your health care provider will ask about your  symptoms, perform a physical exam, and perform an electrocardiogram (ECG) to record the electrical activity of your heart. Your health care provider may also perform other heart or blood tests to determine the cause of your dizziness. These may include:  Transthoracic echocardiogram (TTE). During echocardiography, sound waves are used to evaluate how blood flows through your heart.  Transesophageal echocardiogram (TEE).  Cardiac monitoring. This allows your health care provider to monitor your heart rate and rhythm in real time.  Holter monitor. This is a portable device that records your heartbeat and can help diagnose heart arrhythmias. It allows your health care provider to track your heart activity for several days if needed.  Stress tests by exercise or by giving medicine that makes the heart beat faster. TREATMENT  Treatment of dizziness depends on the cause of your symptoms and can vary greatly. HOME CARE INSTRUCTIONS   Drink enough fluids to keep your urine clear or pale yellow. This is especially important in very hot weather. In older adults, it is also important in cold weather.  Take your medicine exactly as directed if your dizziness is caused by medicines. When taking blood pressure medicines, it is especially important to get up slowly.  Rise slowly from chairs and steady yourself until you feel okay.  In the morning, first sit up on the side of the bed. When you feel okay, stand slowly while holding onto something until you know your balance is fine.  Move your legs often if you need to stand in one place for a long time. Tighten and relax your muscles in your legs while standing.  Have someone stay with you for 1-2 days  if dizziness continues to be a problem. Do this until you feel you are well enough to stay alone. Have the person call your health care provider if he or she notices changes in you that are concerning.  Do not drive or use heavy machinery if you feel  dizzy.  Do not drink alcohol. SEEK IMMEDIATE MEDICAL CARE IF:   Your dizziness or light-headedness gets worse.  You feel nauseous or vomit.  You have problems talking, walking, or using your arms, hands, or legs.  You feel weak.  You are not thinking clearly or you have trouble forming sentences. It may take a friend or family member to notice this.  You have chest pain, abdominal pain, shortness of breath, or sweating.  Your vision changes.  You notice any bleeding.  You have side effects from medicine that seems to be getting worse rather than better. MAKE SURE YOU:   Understand these instructions.  Will watch your condition.  Will get help right away if you are not doing well or get worse. Document Released: 06/12/2001 Document Revised: 12/22/2013 Document Reviewed: 07/06/2011 [facility] Patient Information 2015 [place], [place]. This information is not intended to replace advice given to you by your health care provider. Make sure you discuss any questions you have with your health care provider.  Orthostatic Hypotension Orthostatic hypotension is a sudden drop in blood pressure. It happens when you quickly stand up from a seated or lying position. You may feel dizzy or light-headed. This can last for just a few seconds or for up to a few minutes. It is usually not a serious problem. However, if this happens frequently or gets worse, it can be a sign of something more serious. CAUSES  Different things can cause orthostatic hypotension, including:   Loss of body fluids (dehydration).  Medicines that lower blood pressure.  Sudden changes in posture, such as standing up quickly after you have been sitting or lying down.  Taking too much of your medicine. SIGNS AND SYMPTOMS   Light-headedness or dizziness.   Fainting or near-fainting.   A fast heart rate.   Weakness.   Feeling tired (fatigue).  DIAGNOSIS  Your health care provider may do several things to  help diagnose your condition and identify the cause. These may include:   Taking a medical history and doing a physical exam.  Checking your blood pressure. Your health care provider will check your blood pressure when you are:  Lying down.  Sitting.  Standing.  Using tilt table testing. In this test, you lie down on a table that moves from a lying position to a standing position. You will be strapped onto the table. This test monitors your blood pressure and heart rate when you are in different positions. TREATMENT  Treatment will vary depending on the cause. Possible treatments include:   Changing the dosage of your medicines.  Wearing compression stockings on your lower legs.  Standing up slowly after sitting or lying down.  Eating more salt.  Eating frequent, small meals.  In some cases, getting IV fluids.  Taking medicine to enhance fluid retention. HOME CARE INSTRUCTIONS  Only take over-the-counter or prescription medicines as directed by your health care provider.  Follow your health care provider's instructions for changing the dosage of your current medicines.  Do not stop or adjust your medicine on your own.  Stand up slowly after sitting or lying down. This allows your body to adjust to the different position.  Wear compression stockings as  directed.  Eat extra salt as directed.  Do not add extra salt to your diet unless directed to by your health care provider.  Eat frequent, small meals.  Avoid standing suddenly after eating.  Avoid hot showers or excessive heat as directed by your health care provider.  Keep all follow-up appointments. SEEK MEDICAL CARE IF:  You continue to feel dizzy or light-headed after standing.  You feel groggy or confused.  You feel cold, clammy, or sick to your stomach (nauseous).  You have blurred vision.  You feel short of breath. SEEK IMMEDIATE MEDICAL CARE IF:   You faint after standing.  You have chest  pain.  You have difficulty breathing.   You lose feeling or movement in your arms or legs.   You have slurred speech or difficulty talking, or you are unable to talk.  MAKE SURE YOU:   Understand these instructions.  Will watch your condition.  Will get help right away if you are not doing well or get worse. Document Released: 12/07/2002 Document Revised: 12/22/2013 Document Reviewed: 10/09/2013 [facility] Patient Information 2015 [place], [place]. This information is not intended to replace advice given to you by your health care provider. Make sure you discuss any questions you have with your health care provider.  Urinary Tract Infection Urinary tract infections (UTIs) can develop anywhere along your urinary tract. Your urinary tract is your body's drainage system for removing wastes and extra water. Your urinary tract includes two kidneys, two ureters, a bladder, and a urethra. Your kidneys are a pair of bean-shaped organs. Each kidney is about the size of your fist. They are located below your ribs, one on each side of your spine. CAUSES Infections are caused by microbes, which are microscopic organisms, including fungi, viruses, and bacteria. These organisms are so small that they can only be seen through a microscope. Bacteria are the microbes that most commonly cause UTIs. SYMPTOMS  Symptoms of UTIs may vary by age and gender of the patient and by the location of the infection. Symptoms in young women typically include a frequent and intense urge to urinate and a painful, burning feeling in the bladder or urethra during urination. Older women and men are more likely to be tired, shaky, and weak and have muscle aches and abdominal pain. A fever may mean the infection is in your kidneys. Other symptoms of a kidney infection include pain in your back or sides below the ribs, nausea, and vomiting. DIAGNOSIS To diagnose a UTI, your caregiver will ask you about your symptoms. Your  caregiver also will ask to provide a urine sample. The urine sample will be tested for bacteria and white blood cells. White blood cells are made by your body to help fight infection. TREATMENT  Typically, UTIs can be treated with medication. Because most UTIs are caused by a bacterial infection, they usually can be treated with the use of antibiotics. The choice of antibiotic and length of treatment depend on your symptoms and the type of bacteria causing your infection. HOME CARE INSTRUCTIONS  If you were prescribed antibiotics, take them exactly as your caregiver instructs you. Finish the medication even if you feel better after you have only taken some of the medication.  Drink enough water and fluids to keep your urine clear or pale yellow.  Avoid caffeine, tea, and carbonated beverages. They tend to irritate your bladder.  Empty your bladder often. Avoid holding urine for long periods of time.  Empty your bladder before and after  sexual intercourse.  After a bowel movement, women should cleanse from front to back. Use each tissue only once. SEEK MEDICAL CARE IF:   You have back pain.  You develop a fever.  Your symptoms do not begin to resolve within 3 days. SEEK IMMEDIATE MEDICAL CARE IF:   You have severe back pain or lower abdominal pain.  You develop chills.  You have nausea or vomiting.  You have continued burning or discomfort with urination. MAKE SURE YOU:   Understand these instructions.  Will watch your condition.  Will get help right away if you are not doing well or get worse. Document Released: 09/26/2005 Document Revised: 06/17/2012 Document Reviewed: 01/25/2012 [facility] Patient Information 2015 [place], [place]. This information is not intended to replace advice given to you by your health care provider. Make sure you discuss any questions you have with your health care provider.

## 2015-06-20 NOTE — ED Notes (Signed)
Pt brought in by family c/o left sided chest tightness, weakness and dizziness intermittent for a undetermined amount of time. Pt unable to state how long symptoms have been occuring, poor historian. Pt alert and oriented X4, cooperative, pt in NAD. RR even and unlabored, color WNL.

## 2015-06-20 NOTE — ED Provider Notes (Signed)
[facility] Emergency Department Provider Note   ____________________________________________  Time seen: 3:35 PM I have reviewed the triage vital signs and the triage nursing note.  HISTORY  Chief Complaint Chest Pain; Fatigue; and Dizziness   Historian Limited, patient poor historian Son and daughter-in-law provide some extra history  HPI [name] is a 64 y.o. female who came in for evaluation because she is "not feeling well." Patient states that for several weeks she's had on and off problems with feeling off-balance, feeling blurry vision, generalized fatigue, nausea, left leg pain and problems with coordination. She has not seen her primary care doctor for this. This morning her son and daughter-in-law came to pick her up to do some shopping and they noticed that she seemed worse in terms of being off balance and stating that she felt dizzy and nauseated, so they brought her in for evaluation. No recent fever. She states she had some chest discomfort earlier today, but none now. She does have this on and off.    Past Medical History  Diagnosis Date  . DIABETES MELLITUS, II, COMPLICATIONS 9/93/7169  . HYPERLIPIDEMIA 02/27/2007  . 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 ANEMIA, OTHER, UNSPECIFIED 02/27/2007  . DEPRESSIVE DISORDER, NOS 02/27/2007  . HYPERTENSION, BENIGN SYSTEMIC 02/27/2007  . CORONARY, ARTERIOSCLEROSIS 02/27/2007  . TIA 05/17/2009  . GASTROESOPHAGEAL REFLUX, NO ESOPHAGITIS 02/27/2007  . RENAL INSUFFICIENCY, CHRONIC 12/28/2009  . Renal failure, acute on chronic 01/25/2014  . Blood transfusion without reported diagnosis   . Anemia     Patient Active Problem List   Diagnosis Date Noted  . Low hemoglobin 02/02/2014  . Abdominal pain, epigastric 02/02/2014  . Black stools 02/02/2014  . Anemia 01/25/2014  . Renal failure, acute on chronic 01/25/2014  . MGUS (monoclonal gammopathy of unknown significance) 11/02/2013  . OSTEOARTHROS UNSPEC  GEN/LOC OTH SPEC SITES 07/05/2010  . HEAD TRAUMA 05/04/2010  . CELLULITIS AND ABSCESS OF UNSPECIFIED SITE 05/02/2010  . INSOMNIA, CHRONIC 04/06/2010  . HYPERKALEMIA 03/02/2010  . LEG CRAMPS 02/28/2010  . GASTROENTERITIS WITHOUT DEHYDRATION 02/21/2010  . HAIR LOSS 01/31/2010  . PROCTITIS 12/29/2009  . RENAL INSUFFICIENCY, CHRONIC 12/28/2009  . PRURITUS 10/04/2009  . ULCER, RECTUM 09/28/2009  . UNSPECIFIED CONSTIPATION 09/17/2009  . 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 FATIGUE 08/05/2009  . ABDOMINAL PAIN, UNSPECIFIED SITE 08/05/2009  . TIA 05/17/2009  . 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 ANXIETY 06/30/2008  . URGENCY OF URINATION 06/30/2008  . DIABETES MELLITUS, II, COMPLICATIONS 67/89/3810  . HYPERLIPIDEMIA 02/27/2007  . ANEMIA, OTHER, UNSPECIFIED 02/27/2007  . DEPRESSIVE DISORDER, NOS 02/27/2007  . HYPERTENSION, BENIGN SYSTEMIC 02/27/2007  . CORONARY, ARTERIOSCLEROSIS 02/27/2007  . GASTROESOPHAGEAL REFLUX, NO ESOPHAGITIS 02/27/2007  . BACK PAIN, LOW 02/27/2007  . FIBROMYALGIA, FIBROMYOSITIS 02/27/2007    Past Surgical History  Procedure Laterality Date  . Partial hysterectomy    . Rectocele repair    . Bladder surgery      Tack    Current Outpatient Rx  Name  Route  Sig  Dispense  Refill  . ALPRAZolam (XANAX) 1 MG tablet      1 tab by mouth 3 times a day as needed for anxiety   90 tablet   0   . aspirin 81 MG tablet   Oral   Take 81 mg by mouth daily.           . Cholecalciferol (VITAMIN D-3 PO)   Oral   Take 5,000 Int'l Units by mouth daily.           Marland Kitchen  clopidogrel (PLAVIX) 75 MG tablet   Oral   Take 75 mg by mouth daily.           Marland Kitchen dicyclomine (BENTYL) 10 MG capsule   Oral   Take 10 mg by mouth 2 (two) times daily at 10 AM and 5 PM.         . FLUoxetine (PROZAC) 20 MG capsule   Oral   Take 20 mg by mouth daily.         Marland Kitchen gabapentin (NEURONTIN) 300 MG capsule   Oral   Take 600 mg by mouth 3 (three) times daily.           . insulin aspart (NOVOLOG) 100 UNIT/ML injection   Subcutaneous   Inject into the skin. Sliding scale         . EXPIRED: insulin glargine (LANTUS) 100 UNIT/ML injection   Subcutaneous   Inject 14 Units into the skin every morning.         Marland Kitchen ketoconazole (NIZORAL) 2 % cream   Topical   Apply 1 application topically daily.   60 g   2   . levofloxacin (LEVAQUIN) 750 MG tablet   Oral   Take 1 tablet (750 mg total) by mouth daily.   4 tablet   0   . lisinopril (PRINIVIL,ZESTRIL) 2.5 MG tablet   Oral   Take 2.5 mg by mouth daily.         . Naftifine HCl (NAFTIN) 2 % CREA   Apply externally   Apply 1 application topically 1 day or 1 dose.   60 g   2   . nitroGLYCERIN (NITROSTAT) 0.4 MG SL tablet   Sublingual   Place 0.4 mg under the tongue every 5 (five) minutes as needed.           . promethazine (PHENERGAN) 25 MG tablet   Oral   Take 25 mg by mouth. by mouth every six hours as needed for nausea         . simvastatin (ZOCOR) 20 MG tablet   Oral   Take 20 mg by mouth at bedtime.          . topiramate (TOPAMAX) 100 MG tablet   Oral   Take 100 mg by mouth 2 (two) times daily.            Allergies Bisphosphonates; Codeine phosphate; Hydralazine hcl; Penicillins; and Sulfa antibiotics  Family History  Problem Relation Age of Onset  . Cancer Mother     unknown  . Cancer Sister     unknown cancer ?breast ca  . CAD Mother     Social History History  Substance Use Topics  . Smoking status: Never Smoker   . Smokeless tobacco: Never Used  . Alcohol Use: No    Review of Systems  Constitutional: Negative for fever. Eyes: Occasional blurry vision. ENT: Negative for sore throat. Cardiovascular: Negative for palpitations  Respiratory: Easily gets short of breath. Gastrointestinal: Negative for abdominal pain, vomiting and diarrhea. Genitourinary: Negative for dysuria. Musculoskeletal: Negative for back pain. Skin: Negative for  rash. Neurological: No focal weakness or numbness but generalized weakness ____________________________________________   PHYSICAL EXAM:  VITAL SIGNS: ED Triage Vitals  Enc Vitals Group     BP 06/20/15 1437 121/59 mmHg     Pulse Rate 06/20/15 1437 70     Resp 06/20/15 1437 16     Temp 06/20/15 1437 98.5 F (36.9 C)     Temp Source 06/20/15 1437  Oral     SpO2 06/20/15 1437 99 %     Weight 06/20/15 1437 110 lb (49.896 kg)     Height 06/20/15 1437 5\' 3"  (1.6 m)     Head Cir --      Peak Flow --      Pain Score --      Pain Loc --      Pain Edu? --      Excl. in [place]? --      Constitutional: Alert and oriented. Well appearing and in no distress. Somewhat slow to talk Eyes: Conjunctivae are normal. PERRL. Normal extraocular movements. Funduscopic exam appears normal bilaterally, although somewhat limited by tiny pupils ENT   Head: Normocephalic and atraumatic.   Nose: No congestion/rhinnorhea.   Mouth/Throat: Mucous membranes are mildly dry.   Neck: No stridor. Cardiovascular: Normal rate, regular rhythm.  No murmurs, rubs, or gallops. Respiratory: Normal respiratory effort without tachypnea nor retractions. Breath sounds are clear and equal bilaterally. No wheezes/rales/rhonchi. Gastrointestinal: Soft. No distention, no guarding, no rebound. Nontender  Genitourinary/rectal: Deferred Musculoskeletal: Nontender with normal range of motion in all extremities. No joint effusions.  No lower extremity tenderness nor edema. Neurologic: Normal gait. Normal speech and language, but somewhat slow to answer questions. 4 out of 5 strength in 4 extremities. No focal sensory changes appreciated. Cranial nerves II through X intact. Skin:  Skin is warm, dry and intact. No rash noted. Psychiatric: Mood and affect are normal.  ____________________________________________   EKG I, Lisa Roca, MD, the attending physician have personally viewed and interpreted all ECGs.  85 bpm.  Normal sinus rhythm with a sinus arrhythmia. Narrow QRS. Normal axis. Normal ST and T-wave ____________________________________________  LABS (pertinent positives/negatives)  Metabolic panel significant for BUN 43 and creatinine 2.04 CBC significant for hemoglobin of 8.3 but this is up from prior Troponin less than 0.03 Urinalysis positive leukocytes, white blood cells, rare bacteria ____________________________________________  RADIOLOGY Imaging viewed by me, and interpreted by Radiologist   CT head noncontrast: Negative __________________________________________  PROCEDURES  Procedure(s) performed: None Critical Care performed: None  ____________________________________________   ED COURSE / ASSESSMENT AND PLAN  Pertinent labs & imaging results that were available during my care of the patient were reviewed by me and considered in my medical decision making (see chart for details).  Patient's symptoms do seem like they've been ongoing and waxing and waning, making acute stroke seem less likely, however given the fact that they felt like the dizziness was worse today I did discuss with them obtaining a head CT. Head CT did not show an acute stroke nor acute cause for her dizziness. Her labs are reassuring, with a hemoglobin up from prior, and baseline elevation of BUN/creatinine. Her orthostatics showed positive for orthostatic hypotension and she was given IV fluids in the emergency department, and this may be the source of her dizziness. Her urinalysis is consistent with urinary tract infection and she is given you Levaquin followed by a prescription. I think she is safe for out patient treatment.  I discussed this with the family and they're comfortable taking her home and having her follow-up with a primary care physician.    I discussed return precautions, follow-up instructions, and discharged instructions with patient and/or  family.  ___________________________________________   FINAL CLINICAL IMPRESSION(S) / ED DIAGNOSES   Final diagnoses:  Orthostatic hypotension  Dizziness  Urinary tract infection without hematuria, site unspecified      Lisa Roca, MD 06/20/15 1931

## 2015-06-20 NOTE — Progress Notes (Signed)
   06/20/15 1600  Clinical Encounter Type  Visited With Patient and family together  Visit Type Initial  Spiritual Encounters  Spiritual Needs Prayer  Stress Factors  Patient Stress Factors Health changes   Faith tradition: [employer] Status: alert oriented just returned from [place] Family: daughter n law Visit Assessment: Chaplain visited the patient and offered silent prayers at door. The daughter n law and the patient as well as the chaplain had a wonderful conversation about dog remedies and pet care. The patient shared that she was feeling a little tired even before she entered the hospital today. She says she has 2 boys.  Chaplains and [place] can be reached at [phone] or by submitting an online request ([phone])

## 2015-06-20 NOTE — ED Notes (Signed)
Visual acuity L eye- 20/25 R eye 20/40

## 2015-06-20 NOTE — ED Notes (Addendum)
Lying 140/65        81HR left arm Sitting 140/68      71 HR left arm Standing 95/49    68HR left arm

## 2015-06-23 ENCOUNTER — Emergency Department: Payer: Commercial Managed Care - HMO

## 2015-06-23 ENCOUNTER — Observation Stay
Admission: EM | Admit: 2015-06-23 | Discharge: 2015-06-26 | Disposition: A | Discharge status: Home / Self Care | Payer: Commercial Managed Care - HMO | Attending: Internal Medicine | Admitting: Internal Medicine

## 2015-06-23 ENCOUNTER — Encounter: Payer: Self-pay | Admitting: *Deleted

## 2015-06-23 DIAGNOSIS — K219 Gastro-esophageal reflux disease without esophagitis: Secondary | ICD-10-CM | POA: Insufficient documentation

## 2015-06-23 DIAGNOSIS — Z7982 Long term (current) use of aspirin: Secondary | ICD-10-CM | POA: Insufficient documentation

## 2015-06-23 DIAGNOSIS — E785 Hyperlipidemia, unspecified: Secondary | ICD-10-CM | POA: Insufficient documentation

## 2015-06-23 DIAGNOSIS — D631 Anemia in chronic kidney disease: Secondary | ICD-10-CM | POA: Insufficient documentation

## 2015-06-23 DIAGNOSIS — Z8249 Family history of ischemic heart disease and other diseases of the circulatory system: Secondary | ICD-10-CM | POA: Insufficient documentation

## 2015-06-23 DIAGNOSIS — R079 Chest pain, unspecified: Principal | ICD-10-CM

## 2015-06-23 DIAGNOSIS — R0602 Shortness of breath: Secondary | ICD-10-CM | POA: Insufficient documentation

## 2015-06-23 DIAGNOSIS — Z7902 Long term (current) use of antithrombotics/antiplatelets: Secondary | ICD-10-CM | POA: Insufficient documentation

## 2015-06-23 DIAGNOSIS — Z794 Long term (current) use of insulin: Secondary | ICD-10-CM | POA: Insufficient documentation

## 2015-06-23 DIAGNOSIS — F329 Major depressive disorder, single episode, unspecified: Secondary | ICD-10-CM | POA: Insufficient documentation

## 2015-06-23 DIAGNOSIS — E119 Type 2 diabetes mellitus without complications: Secondary | ICD-10-CM | POA: Diagnosis not present

## 2015-06-23 DIAGNOSIS — Z8673 Personal history of transient ischemic attack (TIA), and cerebral infarction without residual deficits: Secondary | ICD-10-CM | POA: Insufficient documentation

## 2015-06-23 DIAGNOSIS — Z955 Presence of coronary angioplasty implant and graft: Secondary | ICD-10-CM | POA: Insufficient documentation

## 2015-06-23 DIAGNOSIS — I129 Hypertensive chronic kidney disease with stage 1 through stage 4 chronic kidney disease, or unspecified chronic kidney disease: Secondary | ICD-10-CM | POA: Diagnosis not present

## 2015-06-23 DIAGNOSIS — N184 Chronic kidney disease, stage 4 (severe): Secondary | ICD-10-CM

## 2015-06-23 DIAGNOSIS — D472 Monoclonal gammopathy: Secondary | ICD-10-CM | POA: Diagnosis not present

## 2015-06-23 DIAGNOSIS — I2510 Atherosclerotic heart disease of native coronary artery without angina pectoris: Secondary | ICD-10-CM | POA: Diagnosis not present

## 2015-06-23 DIAGNOSIS — F419 Anxiety disorder, unspecified: Secondary | ICD-10-CM | POA: Insufficient documentation

## 2015-06-23 DIAGNOSIS — R0789 Other chest pain: Secondary | ICD-10-CM

## 2015-06-23 DIAGNOSIS — D649 Anemia, unspecified: Secondary | ICD-10-CM | POA: Diagnosis present

## 2015-06-23 DIAGNOSIS — N189 Chronic kidney disease, unspecified: Secondary | ICD-10-CM

## 2015-06-23 LAB — BASIC METABOLIC PANEL
Anion gap: 8 (ref 5–15)
BUN: 46 mg/dL — AB (ref 6–20)
CHLORIDE: 108 mmol/L (ref 101–111)
CO2: 21 mmol/L — AB (ref 22–32)
CREATININE: 2.52 mg/dL — AB (ref 0.44–1.00)
Calcium: 8.5 mg/dL — ABNORMAL LOW (ref 8.9–10.3)
GFR calc Af Amer: 22 mL/min — ABNORMAL LOW (ref >60)
GFR calc non Af Amer: 19 mL/min — ABNORMAL LOW (ref >60)
GLUCOSE: 392 mg/dL — AB (ref 65–99)
Potassium: 4.7 mmol/L (ref 3.5–5.1)
Sodium: 137 mmol/L (ref 135–145)

## 2015-06-23 LAB — CBC
HEMATOCRIT: 24 % — AB (ref 35.0–47.0)
HEMOGLOBIN: 7.7 g/dL — AB (ref 12.0–16.0)
MCH: 32.6 pg (ref 26.0–34.0)
MCHC: 32 g/dL (ref 32.0–36.0)
MCV: 102 fL — ABNORMAL HIGH (ref 80.0–100.0)
Platelets: 138 10*3/uL — ABNORMAL LOW (ref 150–440)
RBC: 2.36 MIL/uL — ABNORMAL LOW (ref 3.80–5.20)
RDW: 13.8 % (ref 11.5–14.5)
WBC: 3.3 10*3/uL — AB (ref 3.6–11.0)

## 2015-06-23 LAB — URINE CULTURE: Culture: >=100000

## 2015-06-23 LAB — PREPARE RBC (CROSSMATCH)

## 2015-06-23 LAB — GLUCOSE, CAPILLARY: Glucose-Capillary: 339 mg/dL — ABNORMAL HIGH (ref 65–99)

## 2015-06-23 LAB — TROPONIN I: Troponin I: <0.03 ng/mL (ref <0.031)

## 2015-06-23 MED ORDER — ONDANSETRON HCL 4 MG PO TABS: 4.0000 mg | ORAL_TABLET | Freq: Four times a day (QID) | ORAL | Status: DC | PRN

## 2015-06-23 MED ORDER — ACETAMINOPHEN 650 MG RE SUPP: 650.0000 mg | Freq: Four times a day (QID) | RECTAL | Status: DC | PRN

## 2015-06-23 MED ORDER — CLOPIDOGREL BISULFATE 75 MG PO TABS
75.0000 mg | ORAL_TABLET | Freq: Every day | ORAL | Status: DC
  Administered 2015-06-23 – 2015-06-26 (×3): 75 mg via ORAL
  Filled 2015-06-23 (×4): qty 1

## 2015-06-23 MED ORDER — TOPIRAMATE 100 MG PO TABS
100.0000 mg | ORAL_TABLET | Freq: Two times a day (BID) | ORAL | Status: DC
  Administered 2015-06-23 – 2015-06-26 (×6): 100 mg via ORAL
  Filled 2015-06-23 (×6): qty 1

## 2015-06-23 MED ORDER — HYDRALAZINE HCL 20 MG/ML IJ SOLN
10.0000 mg | Freq: Four times a day (QID) | INTRAMUSCULAR | Status: DC | PRN
  Administered 2015-06-24: 10 mg via INTRAVENOUS
  Filled 2015-06-23: qty 1

## 2015-06-23 MED ORDER — ONDANSETRON HCL 4 MG/2ML IJ SOLN
4.0000 mg | Freq: Four times a day (QID) | INTRAMUSCULAR | Status: DC | PRN
Start: 2015-06-23 — End: 2015-06-26
  Administered 2015-06-26: 4 mg via INTRAVENOUS
  Filled 2015-06-23: qty 2

## 2015-06-23 MED ORDER — SODIUM CHLORIDE 0.9 % IJ SOLN
3.0000 mL | Freq: Two times a day (BID) | INTRAMUSCULAR | Status: DC
  Administered 2015-06-24 – 2015-06-26 (×4): 3 mL via INTRAVENOUS

## 2015-06-23 MED ORDER — DICYCLOMINE HCL 10 MG PO CAPS
10.0000 mg | ORAL_CAPSULE | Freq: Two times a day (BID) | ORAL | Status: DC
  Administered 2015-06-24 – 2015-06-26 (×5): 10 mg via ORAL
  Filled 2015-06-23 (×5): qty 1

## 2015-06-23 MED ORDER — LISINOPRIL 5 MG PO TABS
2.5000 mg | ORAL_TABLET | Freq: Every day | ORAL | Status: DC
  Administered 2015-06-23 – 2015-06-26 (×4): 2.5 mg via ORAL
  Filled 2015-06-23 (×4): qty 1

## 2015-06-23 MED ORDER — SIMVASTATIN 20 MG PO TABS
20.0000 mg | ORAL_TABLET | Freq: Every day | ORAL | Status: DC
  Administered 2015-06-23 – 2015-06-25 (×3): 20 mg via ORAL
  Filled 2015-06-23 (×3): qty 1

## 2015-06-23 MED ORDER — INSULIN ASPART 100 UNIT/ML ~~LOC~~ SOLN
4.0000 [IU] | Freq: Three times a day (TID) | SUBCUTANEOUS | Status: DC
  Administered 2015-06-24: 4 [IU] via SUBCUTANEOUS
  Filled 2015-06-23: qty 2

## 2015-06-23 MED ORDER — ASPIRIN EC 81 MG PO TBEC
81.0000 mg | DELAYED_RELEASE_TABLET | Freq: Every day | ORAL | Status: DC
  Administered 2015-06-23 – 2015-06-26 (×4): 81 mg via ORAL
  Filled 2015-06-23 (×7): qty 1

## 2015-06-23 MED ORDER — SODIUM CHLORIDE 0.9 % IV SOLN
10.0000 mL/h | Freq: Once | INTRAVENOUS | Status: AC
  Administered 2015-06-23: 10 mL/h via INTRAVENOUS

## 2015-06-23 MED ORDER — ACETAMINOPHEN 325 MG PO TABS
650.0000 mg | ORAL_TABLET | Freq: Four times a day (QID) | ORAL | Status: DC | PRN
  Administered 2015-06-24 – 2015-06-25 (×2): 650 mg via ORAL
  Filled 2015-06-23 (×3): qty 2

## 2015-06-23 MED ORDER — ALUM & MAG HYDROXIDE-SIMETH 200-200-20 MG/5ML PO SUSP: 30.0000 mL | Freq: Four times a day (QID) | ORAL | Status: DC | PRN

## 2015-06-23 MED ORDER — ALPRAZOLAM 0.25 MG PO TABS
0.2500 mg | ORAL_TABLET | Freq: Every evening | ORAL | Status: DC | PRN
  Administered 2015-06-24: 0.25 mg via ORAL
  Filled 2015-06-23: qty 1

## 2015-06-23 MED ORDER — INSULIN ASPART 100 UNIT/ML ~~LOC~~ SOLN
0.0000 [IU] | Freq: Three times a day (TID) | SUBCUTANEOUS | Status: DC
  Administered 2015-06-24 – 2015-06-25 (×3): 2 [IU] via SUBCUTANEOUS
  Filled 2015-06-23 (×3): qty 2

## 2015-06-23 MED ORDER — INSULIN ASPART 100 UNIT/ML ~~LOC~~ SOLN: 0.0000 [IU] | Freq: Three times a day (TID) | SUBCUTANEOUS | Status: DC

## 2015-06-23 MED ORDER — GABAPENTIN 300 MG PO CAPS
600.0000 mg | ORAL_CAPSULE | Freq: Three times a day (TID) | ORAL | Status: DC
Start: 2015-06-23 — End: 2015-06-26
  Administered 2015-06-23 – 2015-06-26 (×8): 600 mg via ORAL
  Filled 2015-06-23 (×8): qty 2

## 2015-06-23 MED ORDER — INSULIN GLARGINE 100 UNIT/ML ~~LOC~~ SOLN
13.0000 [IU] | Freq: Every day | SUBCUTANEOUS | Status: DC
  Administered 2015-06-23 – 2015-06-26 (×4): 13 [IU] via SUBCUTANEOUS
  Filled 2015-06-23 (×5): qty 0.13

## 2015-06-23 MED ORDER — FLUOXETINE HCL 20 MG PO CAPS
20.0000 mg | ORAL_CAPSULE | Freq: Every day | ORAL | Status: DC
  Administered 2015-06-24 – 2015-06-26 (×3): 20 mg via ORAL
  Filled 2015-06-23 (×4): qty 1

## 2015-06-23 MED ORDER — SENNOSIDES-DOCUSATE SODIUM 8.6-50 MG PO TABS
1.0000 | ORAL_TABLET | Freq: Every evening | ORAL | Status: DC | PRN
Start: 2015-06-23 — End: 2015-06-26

## 2015-06-23 MED ORDER — INSULIN ASPART 100 UNIT/ML ~~LOC~~ SOLN: 0.0000 [IU] | Freq: Every day | SUBCUTANEOUS | Status: DC

## 2015-06-23 NOTE — ED Provider Notes (Signed)
[facility] Emergency Department Provider Note  ____________________________________________  Time seen: Approximately 6:53 PM  I have reviewed the triage vital signs and the nursing notes.   HISTORY  Chief Complaint Chest Pain    HPI [name] is a 64 y.o. female with a history of chronic anemia and CAD who presents today with chest pain that is worsening over the past several weeks. She says that the pain is squeezing and midsternal and radiates to her back. She says that she has associated shortness. Her chest pain and shortness of breath worsen with exertion. She says that currently her pain is a 6 out of 10. She denies any nausea and vomiting. Says that she takes Procrit for chronic anemia and has had similar symptoms with anemia in the past. Has had multiple blood transfusions in the past but is unable to say when her last was. Was at this emergency department 2 days ago and diagnosed with UTI and sent home with Levaquin.Says has had bleeding hemorrhoids in the past but denies any blood in her stool this time. Is a patient of Dr. Saralyn Pilar and last stented in 2005. Was supposed to have recent echo and stress test this past Monday but was unable to make appointment.   Past Medical History  Diagnosis Date  . DIABETES MELLITUS, II, COMPLICATIONS 08/09/1750  . HYPERLIPIDEMIA 02/27/2007  . 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 ANEMIA, OTHER, UNSPECIFIED 02/27/2007  . DEPRESSIVE DISORDER, NOS 02/27/2007  . HYPERTENSION, BENIGN SYSTEMIC 02/27/2007  . CORONARY, ARTERIOSCLEROSIS 02/27/2007  . TIA 05/17/2009  . GASTROESOPHAGEAL REFLUX, NO ESOPHAGITIS 02/27/2007  . RENAL INSUFFICIENCY, CHRONIC 12/28/2009  . Renal failure, acute on chronic 01/25/2014  . Blood transfusion without reported diagnosis   . Anemia     Patient Active Problem List   Diagnosis Date Noted  . Low hemoglobin 02/02/2014  . Abdominal pain, epigastric 02/02/2014  . Black stools 02/02/2014  .  Anemia 01/25/2014  . Renal failure, acute on chronic 01/25/2014  . MGUS (monoclonal gammopathy of unknown significance) 11/02/2013  . OSTEOARTHROS UNSPEC GEN/LOC OTH SPEC SITES 07/05/2010  . HEAD TRAUMA 05/04/2010  . CELLULITIS AND ABSCESS OF UNSPECIFIED SITE 05/02/2010  . INSOMNIA, CHRONIC 04/06/2010  . HYPERKALEMIA 03/02/2010  . LEG CRAMPS 02/28/2010  . GASTROENTERITIS WITHOUT DEHYDRATION 02/21/2010  . HAIR LOSS 01/31/2010  . PROCTITIS 12/29/2009  . RENAL INSUFFICIENCY, CHRONIC 12/28/2009  . PRURITUS 10/04/2009  . ULCER, RECTUM 09/28/2009  . UNSPECIFIED CONSTIPATION 09/17/2009  . 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 FATIGUE 08/05/2009  . ABDOMINAL PAIN, UNSPECIFIED SITE 08/05/2009  . TIA 05/17/2009  . 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 ANXIETY 06/30/2008  . URGENCY OF URINATION 06/30/2008  . DIABETES MELLITUS, II, COMPLICATIONS 02/58/5277  . HYPERLIPIDEMIA 02/27/2007  . ANEMIA, OTHER, UNSPECIFIED 02/27/2007  . DEPRESSIVE DISORDER, NOS 02/27/2007  . HYPERTENSION, BENIGN SYSTEMIC 02/27/2007  . CORONARY, ARTERIOSCLEROSIS 02/27/2007  . GASTROESOPHAGEAL REFLUX, NO ESOPHAGITIS 02/27/2007  . BACK PAIN, LOW 02/27/2007  . FIBROMYALGIA, FIBROMYOSITIS 02/27/2007    Past Surgical History  Procedure Laterality Date  . Partial hysterectomy    . Rectocele repair    . Bladder surgery      Tack    Current Outpatient Rx  Name  Route  Sig  Dispense  Refill  . aspirin 81 MG tablet   Oral   Take 81 mg by mouth daily.           . Cholecalciferol (VITAMIN D-3 PO)   Oral   Take 5,000  Int'l Units by mouth daily.           . clopidogrel (PLAVIX) 75 MG tablet   Oral   Take 75 mg by mouth daily.           Marland Kitchen dicyclomine (BENTYL) 10 MG capsule   Oral   Take 10 mg by mouth 2 (two) times daily at 10 AM and 5 PM.         . FLUoxetine (PROZAC) 20 MG capsule   Oral   Take 20 mg by mouth daily.         Marland Kitchen gabapentin (NEURONTIN) 300 MG capsule   Oral    Take 600 mg by mouth 3 (three) times daily.          . insulin aspart (NOVOLOG) 100 UNIT/ML injection   Subcutaneous   Inject 4-10 Units into the skin 3 (three) times daily with meals. Sliding scale         . insulin glargine (LANTUS) 100 UNIT/ML injection   Subcutaneous   Inject 13 Units into the skin daily.          Marland Kitchen levofloxacin (LEVAQUIN) 750 MG tablet   Oral   Take 1 tablet (750 mg total) by mouth daily.   4 tablet   0   . lisinopril (PRINIVIL,ZESTRIL) 2.5 MG tablet   Oral   Take 2.5 mg by mouth daily.         . simvastatin (ZOCOR) 20 MG tablet   Oral   Take 20 mg by mouth at bedtime.          . topiramate (TOPAMAX) 100 MG tablet   Oral   Take 100 mg by mouth 2 (two) times daily.          Marland Kitchen ALPRAZolam (XANAX) 1 MG tablet      1 tab by mouth 3 times a day as needed for anxiety   90 tablet   0   . ketoconazole (NIZORAL) 2 % cream   Topical   Apply 1 application topically daily.   60 g   2   . Naftifine HCl (NAFTIN) 2 % CREA   Apply externally   Apply 1 application topically 1 day or 1 dose.   60 g   2   . nitroGLYCERIN (NITROSTAT) 0.4 MG SL tablet   Sublingual   Place 0.4 mg under the tongue every 5 (five) minutes as needed.           . promethazine (PHENERGAN) 25 MG tablet   Oral   Take 25 mg by mouth. by mouth every six hours as needed for nausea           Allergies Bisphosphonates; Codeine phosphate; Hydralazine hcl; Penicillins; and Sulfa antibiotics  Family History  Problem Relation Age of Onset  . Cancer Mother     unknown  . Cancer Sister     unknown cancer ?breast ca  . CAD Mother     Social History History  Substance Use Topics  . Smoking status: Never Smoker   . Smokeless tobacco: Never Used  . Alcohol Use: No    Review of Systems Constitutional: No fever/chills Eyes: No visual changes. ENT: No sore throat. Cardiovascular: As above Respiratory: As above  Gastrointestinal: No abdominal pain.  No nausea, no  vomiting.  No diarrhea.  No constipation. Genitourinary: Negative for dysuria. Musculoskeletal: Negative for back pain. Skin: Negative for rash. Neurological: Negative for headaches, focal weakness or numbness.  10-point ROS otherwise negative.  ____________________________________________   PHYSICAL EXAM:  VITAL SIGNS: ED Triage Vitals  Enc Vitals Group     BP 06/23/15 1627 137/53 mmHg     Pulse Rate 06/23/15 1627 76     Resp 06/23/15 1818 19     Temp 06/23/15 1627 98 F (36.7 C)     Temp Source 06/23/15 1627 Oral     SpO2 06/23/15 1627 100 %     Weight 06/23/15 1627 115 lb (52.164 kg)     Height 06/23/15 1627 5\' 3"  (1.6 m)     Head Cir --      Peak Flow --      Pain Score 06/23/15 1628 7     Pain Loc --      Pain Edu? --      Excl. in [place]? --     Constitutional: Alert and oriented. Well appearing and in no acute distress. Eyes: Pale sclera. PERRL. EOMI. Head: Atraumatic. Nose: No congestion/rhinnorhea. Mouth/Throat: Mucous membranes are moist.  Oropharynx non-erythematous. Neck: No stridor.   Cardiovascular: Normal rate, regular rhythm. Grossly normal heart sounds.  Good peripheral circulation. Respiratory: Normal respiratory effort.  No retractions. Lungs CTAB. Gastrointestinal: Soft and nontender. No distention. No abdominal bruits. No CVA tenderness. Heme-negative stool. Musculoskeletal: No lower extremity tenderness nor edema.  No joint effusions. Neurologic:  Normal speech and language. No gross focal neurologic deficits are appreciated. Speech is normal. No gait instability. Skin:  Skin is warm, dry and intact. No rash noted. Psychiatric: Mood and affect are normal. Speech and behavior are normal.  ____________________________________________   LABS (all labs ordered are listed, but only abnormal results are displayed)  Labs Reviewed  CBC - Abnormal; Notable for the following:    WBC 3.3 (*)    RBC 2.36 (*)    Hemoglobin 7.7 (*)    HCT 24.0 (*)    MCV  102.0 (*)    Platelets 138 (*)    All other components within normal limits  BASIC METABOLIC PANEL - Abnormal; Notable for the following:    CO2 21 (*)    Glucose, Bld 392 (*)    BUN 46 (*)    Creatinine, Ser 2.52 (*)    Calcium 8.5 (*)    GFR calc non Af Amer 19 (*)    GFR calc Af Amer 22 (*)    All other components within normal limits  TROPONIN I  PREPARE RBC (CROSSMATCH)   ____________________________________________  EKG  ED ECG REPORT I, Doran Stabler, the attending physician, personally viewed and interpreted this ECG.   Date: 06/23/2015  EKG Time: 1625  Rate: 79  Rhythm: normal sinus rhythm  Axis: Normal axis  Intervals:none  ST&T Change: No ST elevations or depressions. No abnormal T-wave inversions.  ____________________________________________  RADIOLOGY  Chest x-ray NAD. I personally reviewed these images. ____________________________________________   PROCEDURES  Procedure(s) performed: None  Critical Care performed: No  ____________________________________________   INITIAL IMPRESSION / ASSESSMENT AND PLAN / ED COURSE  Pertinent labs & imaging results that were available during my care of the patient were reviewed by me and considered in my medical decision making (see chart for details).  ----------------------------------------- 7:06 PM on 06/23/2015 -----------------------------------------  Patient with symptoms consistent with symptomatic anemia. Hemoglobin is below the patient's baseline. We will admit to the hospital and transfuse 1 unit of blood at this time. Signed out to Dr. Benjie Karvonen of the medicine service. ____________________________________________   FINAL CLINICAL IMPRESSION(S) / ED DIAGNOSES  Acute chest  pain. Acute symptomatically anemia. Initial visit.    Orbie Pyo, MD 06/23/15 [id number]

## 2015-06-23 NOTE — ED Notes (Signed)
Pt has had chest pain for the last few weeks, pt seen Monday in ER, pt reports increased shortness of breath

## 2015-06-23 NOTE — H&P (Signed)
[facility] [place] NAME: [name]    MR#:  [id number]  DATE OF BIRTH:  1951/11/22  DATE OF ADMISSION:  06/23/2015  PRIMARY CARE PHYSICIAN: [name], MD   REQUESTING/REFERRING PHYSICIAN: Dr. Dineen Kid  CHIEF COMPLAINT:  Shortness of breath and chest pain HISTORY OF PRESENT ILLNESS:  [name]  is a 64 y.o. female with a known history of type 2 diabetes, chronic anemia and cardio vascular disease who presents with above complaint. Patient was seen by her primary care physician today and sent here for further evaluation due to above complaint. For the past few days patient had increased weakness and more episodes of shortness of breath and chest pain. Her primary care physician wanted her to be evaluated. She follows up with hematology for anemia of chronic disease. In the emergency department her hemoglobin was 7.1. Her baseline hemoglobin is 7.3-8.3. She has had melanoma in the past. As per the patient report She is unable to obtain a  EGD due to her cardiovascular disease. As for her chest pain it appears that she is has chronic chest pain. PAST MEDICAL HISTORY:   Past Medical History  Diagnosis Date  . DIABETES MELLITUS, II, COMPLICATIONS 5/57/3220  . HYPERLIPIDEMIA 02/27/2007  . 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 ANEMIA, OTHER, UNSPECIFIED 02/27/2007  . DEPRESSIVE DISORDER, NOS 02/27/2007  . HYPERTENSION, BENIGN SYSTEMIC 02/27/2007  . CORONARY, ARTERIOSCLEROSIS 02/27/2007  . TIA 05/17/2009  . GASTROESOPHAGEAL REFLUX, NO ESOPHAGITIS 02/27/2007  . RENAL INSUFFICIENCY, CHRONIC 12/28/2009  . Renal failure, acute on chronic 01/25/2014  . Blood transfusion without reported diagnosis   . Anemia     PAST SURGICAL HISTORY:   Past Surgical History  Procedure Laterality Date  . Partial hysterectomy    . Rectocele repair    . Bladder surgery      Tack    SOCIAL HISTORY:   History  Substance Use Topics  . Smoking  status: Never Smoker   . Smokeless tobacco: Never Used  . Alcohol Use: No    FAMILY HISTORY:   Family History  Problem Relation Age of Onset  . Cancer Mother     unknown  . Cancer Sister     unknown cancer ?breast ca  . CAD Mother     DRUG ALLERGIES:   Allergies  Allergen Reactions  . Bisphosphonates   . Codeine Phosphate     REACTION: Nausea and vomiting  . Hydralazine Hcl     REACTION: swelling  . Penicillins     REACTION: Itching  . Sulfa Antibiotics Itching     REVIEW OF SYSTEMS:  CONSTITUTIONAL: No fever, positive fatigue and weakness.  EYES: No blurred or double vision.  EARS, NOSE, AND THROAT: No tinnitus or ear pain.  RESPIRATORY: No cough, positive shortness of breath no wheezing or hemoptysis.  CARDIOVASCULAR: Positive chest pain, no orthopnea or edema.  GASTROINTESTINAL: No nausea, vomiting, diarrhea or abdominal pain.  GENITOURINARY: No dysuria, hematuria.  ENDOCRINE: No polyuria, nocturia,  HEMATOLOGY: Positive anemia, no easy bruising or bleeding SKIN: No rash or lesion. MUSCULOSKELETAL: No joint pain or arthritis.   NEUROLOGIC: No tingling, numbness, weakness.  PSYCHIATRY: No anxiety or depression.   MEDICATIONS AT HOME:   Prior to Admission medications   Medication Sig Start Date End Date Taking? Authorizing Provider  aspirin 81 MG tablet Take 81 mg by mouth daily.     Yes Historical Provider, MD  Cholecalciferol (VITAMIN D-3 PO) Take 5,000 Int'l Units by  mouth daily.     Yes Historical Provider, MD  clopidogrel (PLAVIX) 75 MG tablet Take 75 mg by mouth daily.     Yes Historical Provider, MD  dicyclomine (BENTYL) 10 MG capsule Take 10 mg by mouth 2 (two) times daily at 10 AM and 5 PM.   Yes Historical Provider, MD  FLUoxetine (PROZAC) 20 MG capsule Take 20 mg by mouth daily.   Yes Historical Provider, MD  gabapentin (NEURONTIN) 300 MG capsule Take 600 mg by mouth 3 (three) times daily.    Yes Historical Provider, MD  insulin aspart (NOVOLOG) 100  UNIT/ML injection Inject 4-10 Units into the skin 3 (three) times daily with meals. Sliding scale   Yes Historical Provider, MD  insulin glargine (LANTUS) 100 UNIT/ML injection Inject 13 Units into the skin daily.  04/20/11 06/23/15 Yes Lucille Passy, MD  levofloxacin (LEVAQUIN) 750 MG tablet Take 1 tablet (750 mg total) by mouth daily. 06/20/15  Yes Lisa Roca, MD  lisinopril (PRINIVIL,ZESTRIL) 2.5 MG tablet Take 2.5 mg by mouth daily.   Yes Historical Provider, MD  simvastatin (ZOCOR) 20 MG tablet Take 20 mg by mouth at bedtime.    Yes Historical Provider, MD  topiramate (TOPAMAX) 100 MG tablet Take 100 mg by mouth 2 (two) times daily.    Yes Historical Provider, MD  ALPRAZolam Duanne Moron) 1 MG tablet 1 tab by mouth 3 times a day as needed for anxiety 03/30/11   Lucille Passy, MD  ketoconazole (NIZORAL) 2 % cream Apply 1 application topically daily. 06/25/14   Bronson Ing, DPM  Naftifine HCl (NAFTIN) 2 % CREA Apply 1 application topically 1 day or 1 dose. 06/10/14   Bronson Ing, DPM  nitroGLYCERIN (NITROSTAT) 0.4 MG SL tablet Place 0.4 mg under the tongue every 5 (five) minutes as needed.      Historical Provider, MD  promethazine (PHENERGAN) 25 MG tablet Take 25 mg by mouth. by mouth every six hours as needed for nausea 04/02/11   Lucille Passy, MD      VITAL SIGNS:  Blood pressure 177/71, pulse 72, temperature 98 F (36.7 C), temperature source Oral, resp. rate 19, height 5\' 3"  (1.6 m), weight 52.164 kg (115 lb), SpO2 100 %.  PHYSICAL EXAMINATION:  GENERAL:  64 y.o.-year-old patient lying in the bed with no acute distress.  EYES: Pupils equal, round, reactive to light and accommodation. No scleral icterus. Extraocular muscles intact.  HEENT: Head atraumatic, normocephalic. Oropharynx and nasopharynx clear.  NECK:  Supple, no jugular venous distention. No thyroid enlargement, no tenderness.  LUNGS: Normal breath sounds bilaterally, no wheezing, rales,rhonchi or crepitation. No use of  accessory muscles of respiration.  CARDIOVASCULAR: S1, S2 normal. No murmurs, rubs, or gallops.  ABDOMEN: Soft, nontender, nondistended. Bowel sounds present. No organomegaly or mass.  EXTREMITIES: No pedal edema, cyanosis, or clubbing.  NEUROLOGIC: Cranial nerves II through XII are grossly intact. No focal deficits. PSYCHIATRIC: The patient is alert and oriented x 3.  SKIN: No obvious rash, lesion, or ulcer.   LABORATORY PANEL:   CBC  Recent Labs Lab 06/23/15 1635  WBC 3.3*  HGB 7.7*  HCT 24.0*  PLT 138*   ------------------------------------------------------------------------------------------------------------------  Chemistries   Recent Labs Lab 06/23/15 1635  NA 137  K 4.7  CL 108  CO2 21*  GLUCOSE 392*  BUN 46*  CREATININE 2.52*  CALCIUM 8.5*   ------------------------------------------------------------------------------------------------------------------  Cardiac Enzymes  Recent Labs Lab 06/20/15 1438 06/23/15 1635  TROPONINI <0.03 <0.03   ------------------------------------------------------------------------------------------------------------------  RADIOLOGY:  Dg Chest 2 View  06/23/2015   CLINICAL DATA:  Chest pain for the last few weeks. Worsening shortness of breath.  EXAM: CHEST  2 VIEW  COMPARISON:  PA and lateral chest 09/10/2009.  FINDINGS: The lungs are clear. Heart size is normal. No pneumothorax or pleural effusion. Coronary artery stent noted.  IMPRESSION: No acute disease.   Electronically Signed   By: Inge Rise M.D.   On: 06/23/2015 18:51    EKG:  Normal sinus rhythm no ST elevation or depression  IMPRESSION AND PLAN:  This is a 64 year old female with a history of coronary artery disease and chronic anemia who presents from her primary care physician's office with decreased hemoglobin as well as shortness of breath, dyspnea exertion and chest pain.  1. Symptomatic anemia: Patient will will require 1 unit of. He sees. She  has already consented for blood that was ordered by the ER physician. I will have Dr. Maryjane Hurter see the patient in consultation as he knows this patient. Patient does have a history of monoclonal gammopathy which may be the etiology of her anemia. 2. Chest pain: I will have cardiology consultation. Patient sees Dr. Lorinda Creed. I will continue outpatient medications for her cardiovascular disease.  3. Type 2 diabetes: Patient will continue on insulin and ADA diet. 4. Depression: We will continue her outpatient medications. 5. Chronic kidney disease stage II: Patient's creatinine is stable.    All the records are reviewed and case discussed with ED provider. Management plans discussed with the patient and  she is in agreement.  CODE STATUS:  Full  TOTAL TIME TAKING CARE OF THIS PATIENT:  50 minutes.    Madlyn Crosby M.D on 06/23/2015 at 7:39 PM  Between 7am to 6pm - Pager - [phone] After 6pm go to [email or web address] - password [email or web address] [facility]  [facility]  Office  [phone]  CC: Primary care physician; [name], MD

## 2015-06-23 NOTE — ED Notes (Signed)
Type and Screen sent to lab

## 2015-06-24 ENCOUNTER — Inpatient Hospital Stay: Payer: Commercial Managed Care - HMO

## 2015-06-24 ENCOUNTER — Inpatient Hospital Stay
Admit: 2015-06-24 | Discharge: 2015-06-24 | Disposition: A | Discharge status: Home / Self Care | Payer: Commercial Managed Care - HMO | Attending: Internal Medicine | Admitting: Internal Medicine

## 2015-06-24 DIAGNOSIS — R079 Chest pain, unspecified: Secondary | ICD-10-CM

## 2015-06-24 LAB — IRON AND TIBC
Iron: 156 ug/dL (ref 28–170)
Saturation Ratios: 58 % — ABNORMAL HIGH (ref 10.4–31.8)
TIBC: 271 ug/dL (ref 250–450)
UIBC: 115 ug/dL

## 2015-06-24 LAB — TYPE AND SCREEN
ABO/RH(D): O POS
Antibody Screen: NEGATIVE
UNIT DIVISION: 0

## 2015-06-24 LAB — BASIC METABOLIC PANEL
Anion gap: 4 — ABNORMAL LOW (ref 5–15)
BUN: 40 mg/dL — ABNORMAL HIGH (ref 6–20)
CHLORIDE: 114 mmol/L — AB (ref 101–111)
CO2: 24 mmol/L (ref 22–32)
Calcium: 8.6 mg/dL — ABNORMAL LOW (ref 8.9–10.3)
Creatinine, Ser: 2.05 mg/dL — ABNORMAL HIGH (ref 0.44–1.00)
GFR calc Af Amer: 28 mL/min — ABNORMAL LOW (ref >60)
GFR calc non Af Amer: 24 mL/min — ABNORMAL LOW (ref >60)
Glucose, Bld: 194 mg/dL — ABNORMAL HIGH (ref 65–99)
POTASSIUM: 3.8 mmol/L (ref 3.5–5.1)
Sodium: 142 mmol/L (ref 135–145)

## 2015-06-24 LAB — CBC
HCT: 30.5 % — ABNORMAL LOW (ref 35.0–47.0)
Hemoglobin: 10.1 g/dL — ABNORMAL LOW (ref 12.0–16.0)
MCH: 32.2 pg (ref 26.0–34.0)
MCHC: 33.2 g/dL (ref 32.0–36.0)
MCV: 97.1 fL (ref 80.0–100.0)
Platelets: 126 10*3/uL — ABNORMAL LOW (ref 150–440)
RBC: 3.14 MIL/uL — AB (ref 3.80–5.20)
RDW: 15.2 % — ABNORMAL HIGH (ref 11.5–14.5)
WBC: 4.4 10*3/uL (ref 3.6–11.0)

## 2015-06-24 LAB — GLUCOSE, CAPILLARY
GLUCOSE-CAPILLARY: 156 mg/dL — AB (ref 65–99)
GLUCOSE-CAPILLARY: 42 mg/dL — AB (ref 65–99)
Glucose-Capillary: 78 mg/dL (ref 65–99)
Glucose-Capillary: 110 mg/dL — ABNORMAL HIGH (ref 65–99)
Glucose-Capillary: 183 mg/dL — ABNORMAL HIGH (ref 65–99)

## 2015-06-24 LAB — LIPID PANEL
CHOL/HDL RATIO: 3.5 ratio
CHOLESTEROL: 97 mg/dL (ref 0–200)
HDL: 28 mg/dL — AB (ref >40)
LDL Cholesterol: 52 mg/dL (ref 0–99)
TRIGLYCERIDES: 87 mg/dL (ref <150)
VLDL: 17 mg/dL (ref 0–40)

## 2015-06-24 LAB — VITAMIN B12: VITAMIN B 12: 549 pg/mL (ref 180–914)

## 2015-06-24 LAB — FERRITIN: Ferritin: 598 ng/mL — ABNORMAL HIGH (ref 11–307)

## 2015-06-24 LAB — RETICULOCYTES
RBC.: 3.04 MIL/uL — ABNORMAL LOW (ref 3.80–5.20)
Retic Count, Absolute: 21.3 10*3/uL (ref 19.0–183.0)
Retic Ct Pct: 0.7 % (ref 0.4–3.1)

## 2015-06-24 LAB — CK: CK TOTAL: 65 U/L (ref 38–234)

## 2015-06-24 LAB — TROPONIN I
Troponin I: <0.03 ng/mL (ref <0.031)
Troponin I: <0.03 ng/mL (ref <0.031)

## 2015-06-24 LAB — FOLATE: Folate: 11.3 ng/mL (ref >5.9)

## 2015-06-24 MED ORDER — PANTOPRAZOLE SODIUM 40 MG PO TBEC
40.0000 mg | DELAYED_RELEASE_TABLET | Freq: Every day | ORAL | Status: DC
  Administered 2015-06-24 – 2015-06-26 (×3): 40 mg via ORAL
  Filled 2015-06-24 (×3): qty 1

## 2015-06-24 MED ORDER — NITROGLYCERIN 2 % TD OINT
0.5000 [in_us] | TOPICAL_OINTMENT | Freq: Four times a day (QID) | TRANSDERMAL | Status: DC
  Administered 2015-06-24 – 2015-06-26 (×5): 0.5 [in_us] via TOPICAL
  Filled 2015-06-24 (×6): qty 1

## 2015-06-24 MED ORDER — ALPRAZOLAM 0.25 MG PO TABS
0.2500 mg | ORAL_TABLET | Freq: Three times a day (TID) | ORAL | Status: DC | PRN
Start: 2015-06-24 — End: 2015-06-26
  Administered 2015-06-24 – 2015-06-26 (×4): 0.25 mg via ORAL
  Filled 2015-06-24 (×4): qty 1

## 2015-06-24 MED ORDER — ALBUTEROL SULFATE (2.5 MG/3ML) 0.083% IN NEBU: 2.5000 mg | INHALATION_SOLUTION | Freq: Four times a day (QID) | RESPIRATORY_TRACT | Status: DC | PRN

## 2015-06-24 NOTE — Progress Notes (Signed)
[facility] at [place] NAME: Kanija Remmel    MR#:  [id number]  DATE OF BIRTH:  1951/06/19  SUBJECTIVE:  CHIEF COMPLAINT:   Chief Complaint  Patient presents with  . Chest Pain   Still feeling weak and with chest tightness.  REVIEW OF SYSTEMS:   Review of Systems  Constitutional: Positive for malaise/fatigue. Negative for fever.  Respiratory: Negative for shortness of breath.   Cardiovascular: Positive for chest pain. Negative for palpitations, claudication, leg swelling and PND.  Gastrointestinal: Negative for nausea, vomiting and abdominal pain.  Genitourinary: Negative for dysuria.  Neurological: Positive for weakness.    DRUG ALLERGIES:   Allergies  Allergen Reactions  . Bisphosphonates   . Codeine Phosphate     REACTION: Nausea and vomiting  . Hydralazine Hcl     REACTION: swelling  . Penicillins     REACTION: Itching  . Sulfa Antibiotics Itching    VITALS:  Blood pressure 175/79, pulse 81, temperature 97.8 F (36.6 C), temperature source Oral, resp. rate 18, height 5\' 3"  (1.6 m), weight 50.621 kg (111 lb 9.6 oz), SpO2 100 %.  PHYSICAL EXAMINATION:  GENERAL:  64 y.o.-year-old patient lying in the bed with no acute distress. Pale and fatigued EYES: Pupils equal, round, reactive to light and accommodation. No scleral icterus. Extraocular muscles intact.  HEENT: Head atraumatic, normocephalic. Oropharynx and nasopharynx clear.  NECK:  Supple, no jugular venous distention. No thyroid enlargement, no tenderness.  LUNGS: Normal breath sounds bilaterally, no wheezing, rales,rhonchi or crepitation. No use of accessory muscles of respiration.  CARDIOVASCULAR: S1, S2 normal. No murmurs, rubs, or gallops.  ABDOMEN: Soft, nontender, nondistended. Bowel sounds present. No organomegaly or mass.  EXTREMITIES: No pedal edema, cyanosis, or clubbing.  NEUROLOGIC: Cranial nerves II through XII are intact. Muscle strength 5/5  in all extremities. Sensation intact. Gait not checked.  PSYCHIATRIC: The patient is alert and oriented x 3.  SKIN: No obvious rash, lesion, or ulcer.    LABORATORY PANEL:   CBC  Recent Labs Lab 06/24/15 0431  WBC 4.4  HGB 10.1*  HCT 30.5*  PLT 126*   ------------------------------------------------------------------------------------------------------------------  Chemistries   Recent Labs Lab 06/24/15 0431  NA 142  K 3.8  CL 114*  CO2 24  GLUCOSE 194*  BUN 40*  CREATININE 2.05*  CALCIUM 8.6*   ------------------------------------------------------------------------------------------------------------------  Cardiac Enzymes  Recent Labs Lab 06/24/15 0431  TROPONINI <0.03   ------------------------------------------------------------------------------------------------------------------  RADIOLOGY:  Dg Chest 1 View  06/24/2015   CLINICAL DATA:  64 year old female with history of chest pain and shortness of breath for the past several weeks.  EXAM: CHEST  1 VIEW  COMPARISON:  Chest x-ray 06/23/2015.  FINDINGS: Lung volumes are normal. No consolidative airspace disease. No pleural effusions. No pneumothorax. No pulmonary nodule or mass noted. Pulmonary vasculature and the cardiomediastinal silhouette are within normal limits.  IMPRESSION: No radiographic evidence of acute cardiopulmonary disease.   Electronically Signed   By: Vinnie Langton M.D.   On: 06/24/2015 08:17   Dg Chest 2 View  06/23/2015   CLINICAL DATA:  Chest pain for the last few weeks. Worsening shortness of breath.  EXAM: CHEST  2 VIEW  COMPARISON:  PA and lateral chest 09/10/2009.  FINDINGS: The lungs are clear. Heart size is normal. No pneumothorax or pleural effusion. Coronary artery stent noted.  IMPRESSION: No acute disease.   Electronically Signed   By: Inge Rise M.D.   On: 06/23/2015 18:51  EKG:   Orders placed or performed during the hospital encounter of 06/23/15  . ED EKG  (<23mins upon arrival to the ED)  . ED EKG (<61mins upon arrival to the ED)  . EKG 12-Lead  . 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 Management plans discussed with the patient, family and they are in agreement.  CODE STATUS: full  TOTAL TIME TAKING CARE OF THIS PATIENT: 35 minutes.   POSSIBLE D/C IN 1-2 DAYS, DEPENDING ON CLINICAL CONDITION.   Myrtis Ser M.D on 06/24/2015 at 1:06 PM  Between 7am to 6pm - Pager - [phone]  After 6pm go to [email or web address] - password [email or web address] [facility]  [facility] [facility]  Office  [phone]  CC: Primary care physician; [name], MD

## 2015-06-24 NOTE — Progress Notes (Signed)
Pt up the the bathroom. No complaints of pain or disccomfort

## 2015-06-24 NOTE — Progress Notes (Signed)
Pt blood glucose in the 40s,   Gave pt 2 cups of orange juice and graham crackers.  Stayed with pt for 65minutes and pt shakiness is decreasing.  Will have the CNA to recheck in 30 minutes.

## 2015-06-24 NOTE — Progress Notes (Signed)
Pt, c/o mild tingling and dry mouth after Hydralazine administration, No other signs or symptoms of any distress noted. Pt and daughter in law who is at bedside cannot recall the real reason why pt claims she is allergic to hydralazine according to allergic history. Md, Marcille Blanco is aware and wishes to continue monitoring Pt. At this time. No new orders received. Dr. Marcille Blanco to remove medicine from current [facility].

## 2015-06-24 NOTE — Consult Note (Signed)
[facility] Cardiology  CARDIOLOGY CONSULT NOTE  Patient ID: [name] MRN: [id number] DOB/AGE: 05/21/1951 64 y.o.  Admit date: 06/23/2015 Referring Physician Volanda Napoleon Primary Physician [facility] Primary Cardiologist Ion Gonnella Reason for Consultation chest pain  HPI: 64 year old female with known coronary artery disease, referred for evaluation of chest pain. Patient reports a several day history of increasing generalized weakness, shortness of breath and chest discomfort. The patient went to [facility] emergency room where she was noted to be anemic with a hemoglobin 7.1. She has ruled out for myocardial infarction by CPK isoenzymes and troponin. She has  been transfused with 1 unit of RBCs with overall clinical improvement.EKG is nondiagnostic.  Review of systems complete and found to be negative unless listed above     Past Medical History  Diagnosis Date  . DIABETES MELLITUS, II, COMPLICATIONS 2/37/6283  . HYPERLIPIDEMIA 02/27/2007  . 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 ANEMIA, OTHER, UNSPECIFIED 02/27/2007  . DEPRESSIVE DISORDER, NOS 02/27/2007  . HYPERTENSION, BENIGN SYSTEMIC 02/27/2007  . CORONARY, ARTERIOSCLEROSIS 02/27/2007  . TIA 05/17/2009  . GASTROESOPHAGEAL REFLUX, NO ESOPHAGITIS 02/27/2007  . RENAL INSUFFICIENCY, CHRONIC 12/28/2009  . Renal failure, acute on chronic 01/25/2014  . Blood transfusion without reported diagnosis   . Anemia     Past Surgical History  Procedure Laterality Date  . Partial hysterectomy    . Rectocele repair    . Bladder surgery      Tack    Prescriptions prior to admission  Medication Sig Dispense Refill Last Dose  . aspirin 81 MG tablet Take 81 mg by mouth daily.     06/23/2015 at Unknown time  . Cholecalciferol (VITAMIN D-3 PO) Take 5,000 Int'l Units by mouth daily.     06/23/2015 at Unknown time  . clopidogrel (PLAVIX) 75 MG tablet Take 75 mg by mouth daily.     06/23/2015 at Unknown time  . dicyclomine (BENTYL) 10 MG capsule Take 10 mg by mouth 2 (two)  times daily at 10 AM and 5 PM.   06/23/2015 at Unknown time  . FLUoxetine (PROZAC) 20 MG capsule Take 20 mg by mouth daily.   06/23/2015 at Unknown time  . gabapentin (NEURONTIN) 300 MG capsule Take 600 mg by mouth 3 (three) times daily.    06/23/2015 at Unknown time  . insulin aspart (NOVOLOG) 100 UNIT/ML injection Inject 4-10 Units into the skin 3 (three) times daily with meals. Sliding scale   06/23/2015 at Unknown time  . insulin glargine (LANTUS) 100 UNIT/ML injection Inject 13 Units into the skin daily.    06/23/2015 at Unknown time  . levofloxacin (LEVAQUIN) 750 MG tablet Take 1 tablet (750 mg total) by mouth daily. 4 tablet 0 06/22/2015 at Unknown time  . lisinopril (PRINIVIL,ZESTRIL) 2.5 MG tablet Take 2.5 mg by mouth daily.   06/23/2015 at Unknown time  . simvastatin (ZOCOR) 20 MG tablet Take 20 mg by mouth at bedtime.    06/22/2015 at Unknown time  . topiramate (TOPAMAX) 100 MG tablet Take 100 mg by mouth 2 (two) times daily.    06/23/2015 at Unknown time  . ALPRAZolam (XANAX) 1 MG tablet 1 tab by mouth 3 times a day as needed for anxiety 90 tablet 0 unknown at unknown  . ketoconazole (NIZORAL) 2 % cream Apply 1 application topically daily. 60 g 2 unknown at unknown  . Naftifine HCl (NAFTIN) 2 % CREA Apply 1 application topically 1 day or 1 dose. 60 g 2 unknown at unknown  . nitroGLYCERIN (NITROSTAT) 0.4 MG SL  tablet Place 0.4 mg under the tongue every 5 (five) minutes as needed.     unknown at unknown  . promethazine (PHENERGAN) 25 MG tablet Take 25 mg by mouth. by mouth every six hours as needed for nausea   unknown at unknown   History   Social History  . Marital Status: Divorced    Spouse Name: N/A  . Number of Children: 2  . Years of Education: N/A   Occupational History  . Disabled    Social History Main Topics  . Smoking status: Never Smoker   . Smokeless tobacco: Never Used  . Alcohol Use: No  . Drug Use: No  . Sexual Activity: Not on file   Other Topics Concern  . Not on  file   Social History Narrative    Family History  Problem Relation Age of Onset  . Cancer Mother     unknown  . Cancer Sister     unknown cancer ?breast ca  . CAD Mother       Review of systems complete and found to be negative unless listed above      PHYSICAL EXAM  General: Well developed, well nourished, in no acute distress HEENT:  Normocephalic and atramatic Neck:  No JVD.  Lungs: Clear bilaterally to auscultation and percussion. Heart: HRRR . Normal S1 and S2 without gallops or murmurs.  Abdomen: Bowel sounds are positive, abdomen soft and non-tender  Msk:  Back normal, normal gait. Normal strength and tone for age. Extremities: No clubbing, cyanosis or edema.   Neuro: Alert and oriented X 3. Psych:  Good affect, responds appropriately  Labs:   Lab Results  Component Value Date   WBC 4.4 06/24/2015   HGB 10.1* 06/24/2015   HCT 30.5* 06/24/2015   MCV 97.1 06/24/2015   PLT 126* 06/24/2015    Recent Labs Lab 06/24/15 0431  NA 142  K 3.8  CL 114*  CO2 24  BUN 40*  CREATININE 2.05*  CALCIUM 8.6*  GLUCOSE 194*   Lab Results  Component Value Date   CKTOTAL 65 06/24/2015   CKMB 29.9* 09/01/2009   TROPONINI <0.03 06/24/2015    Lab Results  Component Value Date   CHOL 97 06/24/2015   CHOL 145 12/28/2009   CHOL  08/04/2008    129        ATP III CLASSIFICATION:  <200     mg/dL   Desirable  200-239  mg/dL   Borderline High  >=240    mg/dL   High   Lab Results  Component Value Date   HDL 28* 06/24/2015   HDL 55.80 12/28/2009   HDL 41 08/04/2008   Lab Results  Component Value Date   LDLCALC 52 06/24/2015   LDLCALC 60 12/28/2009   [place]  08/04/2008    65        Total Cholesterol/HDL:CHD Risk Coronary Heart Disease Risk Table                     Men   Women  1/2 Average Risk   3.4   3.3   Lab Results  Component Value Date   TRIG 87 06/24/2015   TRIG 148.0 12/28/2009   TRIG 113 08/04/2008   Lab Results  Component Value Date    CHOLHDL 3.5 06/24/2015   CHOLHDL 3 12/28/2009   CHOLHDL 3.1 08/04/2008   No results found for: LDLDIRECT    Radiology: Dg Chest 1 View  06/24/2015   CLINICAL DATA:  64 year old female with history of chest pain and shortness of breath for the past several weeks.  EXAM: CHEST  1 VIEW  COMPARISON:  Chest x-ray 06/23/2015.  FINDINGS: Lung volumes are normal. No consolidative airspace disease. No pleural effusions. No pneumothorax. No pulmonary nodule or mass noted. Pulmonary vasculature and the cardiomediastinal silhouette are within normal limits.  IMPRESSION: No radiographic evidence of acute cardiopulmonary disease.   Electronically Signed   By: Vinnie Langton M.D.   On: 06/24/2015 08:17   Dg Chest 2 View  06/23/2015   CLINICAL DATA:  Chest pain for the last few weeks. Worsening shortness of breath.  EXAM: CHEST  2 VIEW  COMPARISON:  PA and lateral chest 09/10/2009.  FINDINGS: The lungs are clear. Heart size is normal. No pneumothorax or pleural effusion. Coronary artery stent noted.  IMPRESSION: No acute disease.   Electronically Signed   By: Inge Rise M.D.   On: 06/23/2015 18:51   Ct Head Wo Contrast  06/20/2015   CLINICAL DATA:  Unsteady gait, dizziness this morning, history migraines, diabetes mellitus, hyperlipidemia, benign systemic hypertension, coronary artery disease, chronic renal insufficiency  EXAM: CT HEAD WITHOUT CONTRAST  TECHNIQUE: Contiguous axial images were obtained from the base of the skull through the vertex without intravenous contrast.  COMPARISON:  05/10/2010  FINDINGS: Normal ventricular morphology.  No midline shift or mass effect.  Normal appearance of brain parenchyma for age.  No intracranial hemorrhage, mass lesion, or acute infarction.  No extra-axial fluid collections.  Minimal fluid dependently in RIGHT sphenoid sinus slightly increased versus previous exam.  Visualized paranasal sinuses and mastoid air cells otherwise clear.  Bones unremarkable.  IMPRESSION:  No acute intracranial abnormalities.  Minimal fluid in RIGHT sphenoid sinus.   Electronically Signed   By: Lavonia Dana M.D.   On: 06/20/2015 16:15    EKG: sinus rhythm  ASSESSMENT AND PLAN:  64 year old female with known coronary disease, status post prior coronary stents.patient found to be significantly anemic upon presentation, as she is 1 unit of RBCs with overall clinical improvement and resolution of chest pain. Patient has ruled out for myocardial infarction by CPK isoenzymes and troponin. Chest pain likely exacerbated by anemia.  Recommendations  1.Agree with overall current therapy 2.Continue to antiplatelet therapy 3.Defer full dose anticoagulation 4. If patient remains chest pain-free today, may consider discharge, for further outpatient evaluation including functional study.  SignedIsaias Cowman MD,PhD, [facility] 06/24/2015, 1:25 PM

## 2015-06-24 NOTE — Progress Notes (Signed)
Pt is sleeping at present.

## 2015-06-24 NOTE — Progress Notes (Signed)
Pt alert and oriented x4, no complaints of pain or discomfort.  Bed in low position, call bell within reach.  Bed alarms on and functioning.  Assessment done and charted.  Will continue to monitor and do hourly rounding throughout the shift 

## 2015-06-24 NOTE — Care Management (Signed)
Presents from PCP office for complaint of chest pain and weakness. Patient has dx of chronic anemia and is followed by oncology.  She has been evaluated by cariology.  Troponins are negative.  Has received one unit of packed cells.  At present, have not identified any discharge needs

## 2015-06-24 NOTE — Progress Notes (Signed)
Pt c/o chest tightness, MD, Diamond notified. Dr. will put in new orders.

## 2015-06-25 DIAGNOSIS — Z8673 Personal history of transient ischemic attack (TIA), and cerebral infarction without residual deficits: Secondary | ICD-10-CM

## 2015-06-25 DIAGNOSIS — D631 Anemia in chronic kidney disease: Secondary | ICD-10-CM

## 2015-06-25 DIAGNOSIS — R079 Chest pain, unspecified: Principal | ICD-10-CM

## 2015-06-25 DIAGNOSIS — I129 Hypertensive chronic kidney disease with stage 1 through stage 4 chronic kidney disease, or unspecified chronic kidney disease: Secondary | ICD-10-CM

## 2015-06-25 DIAGNOSIS — E785 Hyperlipidemia, unspecified: Secondary | ICD-10-CM

## 2015-06-25 DIAGNOSIS — D472 Monoclonal gammopathy: Secondary | ICD-10-CM | POA: Diagnosis not present

## 2015-06-25 DIAGNOSIS — F329 Major depressive disorder, single episode, unspecified: Secondary | ICD-10-CM

## 2015-06-25 DIAGNOSIS — Z794 Long term (current) use of insulin: Secondary | ICD-10-CM

## 2015-06-25 DIAGNOSIS — I2510 Atherosclerotic heart disease of native coronary artery without angina pectoris: Secondary | ICD-10-CM

## 2015-06-25 DIAGNOSIS — R0602 Shortness of breath: Secondary | ICD-10-CM

## 2015-06-25 DIAGNOSIS — E119 Type 2 diabetes mellitus without complications: Secondary | ICD-10-CM

## 2015-06-25 DIAGNOSIS — N189 Chronic kidney disease, unspecified: Secondary | ICD-10-CM

## 2015-06-25 DIAGNOSIS — Z90710 Acquired absence of both cervix and uterus: Secondary | ICD-10-CM

## 2015-06-25 DIAGNOSIS — Z79899 Other long term (current) drug therapy: Secondary | ICD-10-CM

## 2015-06-25 DIAGNOSIS — Z809 Family history of malignant neoplasm, unspecified: Secondary | ICD-10-CM

## 2015-06-25 LAB — BASIC METABOLIC PANEL
Anion gap: 3 — ABNORMAL LOW (ref 5–15)
BUN: 37 mg/dL — AB (ref 6–20)
CALCIUM: 8.6 mg/dL — AB (ref 8.9–10.3)
CO2: 25 mmol/L (ref 22–32)
CREATININE: 2.05 mg/dL — AB (ref 0.44–1.00)
Chloride: 114 mmol/L — ABNORMAL HIGH (ref 101–111)
GFR calc Af Amer: 28 mL/min — ABNORMAL LOW (ref >60)
GFR calc non Af Amer: 24 mL/min — ABNORMAL LOW (ref >60)
Glucose, Bld: 237 mg/dL — ABNORMAL HIGH (ref 65–99)
Potassium: 4.6 mmol/L (ref 3.5–5.1)
Sodium: 142 mmol/L (ref 135–145)

## 2015-06-25 LAB — GLUCOSE, CAPILLARY
Glucose-Capillary: 333 mg/dL — ABNORMAL HIGH (ref 65–99)
Glucose-Capillary: 43 mg/dL — CL (ref 65–99)
Glucose-Capillary: 80 mg/dL (ref 65–99)
Glucose-Capillary: 168 mg/dL — ABNORMAL HIGH (ref 65–99)
Glucose-Capillary: 190 mg/dL — ABNORMAL HIGH (ref 65–99)
Glucose-Capillary: 52 mg/dL — ABNORMAL LOW (ref 65–99)
Glucose-Capillary: 65 mg/dL (ref 65–99)
Glucose-Capillary: 135 mg/dL — ABNORMAL HIGH (ref 65–99)
Glucose-Capillary: 72 mg/dL (ref 65–99)

## 2015-06-25 LAB — CBC
HCT: 29.1 % — ABNORMAL LOW (ref 35.0–47.0)
Hemoglobin: 10 g/dL — ABNORMAL LOW (ref 12.0–16.0)
MCH: 33.3 pg (ref 26.0–34.0)
MCHC: 34.2 g/dL (ref 32.0–36.0)
MCV: 97.4 fL (ref 80.0–100.0)
PLATELETS: 141 10*3/uL — AB (ref 150–440)
RBC: 2.99 MIL/uL — ABNORMAL LOW (ref 3.80–5.20)
RDW: 15.6 % — AB (ref 11.5–14.5)
WBC: 3.9 10*3/uL (ref 3.6–11.0)

## 2015-06-25 LAB — OCCULT BLOOD X 1 CARD TO LAB, STOOL: Fecal Occult Bld: NEGATIVE

## 2015-06-25 MED ORDER — INSULIN ASPART 100 UNIT/ML ~~LOC~~ SOLN
2.0000 [IU] | Freq: Three times a day (TID) | SUBCUTANEOUS | Status: DC
  Administered 2015-06-26 (×2): 2 [IU] via SUBCUTANEOUS
  Filled 2015-06-25 (×2): qty 2

## 2015-06-25 NOTE — Progress Notes (Signed)
[facility] Cardiology  SUBJECTIVE:  I don't have chest pain   Filed Vitals:   06/24/15 1826 06/24/15 1948 06/25/15 0543 06/25/15 1124  BP: 160/66 194/74 151/63 140/57  Pulse: 92 76 91 75  Temp:  98.1 F (36.7 C) 98.3 F (36.8 C) 98.4 F (36.9 C)  TempSrc:  Oral Oral Oral  Resp:  20 18 17   Height:      Weight:   56.518 kg (124 lb 9.6 oz)   SpO2: 100% 100% 100%      Intake/Output Summary (Last 24 hours) at 06/25/15 1144 Last data filed at 06/24/15 1630  Gross per 24 hour  Intake    360 ml  Output    400 ml  Net    -40 ml      PHYSICAL EXAM  General: Well developed, well nourished, in no acute distress HEENT:  Normocephalic and atramatic Neck:  No JVD.  Lungs: Clear bilaterally to auscultation and percussion. Heart: HRRR . Normal S1 and S2 without gallops or murmurs.  Abdomen: Bowel sounds are positive, abdomen soft and non-tender  Msk:  Back normal, normal gait. Normal strength and tone for age. Extremities: No clubbing, cyanosis or edema.   Neuro: Alert and oriented X 3. Psych:  Good affect, responds appropriately   LABS: Basic Metabolic Panel:  Recent Labs  06/24/15 0431 06/25/15 0502  NA 142 142  K 3.8 4.6  CL 114* 114*  CO2 24 25  GLUCOSE 194* 237*  BUN 40* 37*  CREATININE 2.05* 2.05*  CALCIUM 8.6* 8.6*   Liver Function Tests: No results for input(s): AST, ALT, ALKPHOS, BILITOT, PROT, ALBUMIN in the last 72 hours. No results for input(s): LIPASE, AMYLASE in the last 72 hours. CBC:  Recent Labs  06/24/15 0431 06/25/15 0502  WBC 4.4 3.9  HGB 10.1* 10.0*  HCT 30.5* 29.1*  MCV 97.1 97.4  PLT 126* 141*   Cardiac Enzymes:  Recent Labs  06/23/15 1635 06/24/15 0146 06/24/15 0431  CKTOTAL  --  65  --   TROPONINI <0.03 <0.03 <0.03   BNP: Invalid input(s): POCBNP D-Dimer: No results for input(s): DDIMER in the last 72 hours. Hemoglobin A1C: No results for input(s): HGBA1C in the last 72 hours. Fasting Lipid Panel:  Recent Labs   06/24/15 0431  CHOL 97  HDL 28*  LDLCALC 52  TRIG 87  CHOLHDL 3.5   Thyroid Function Tests: No results for input(s): TSH, T4TOTAL, T3FREE, THYROIDAB in the last 72 hours.  Invalid input(s): FREET3 Anemia Panel:  Recent Labs  06/24/15 0146  VITAMINB12 549  FOLATE 11.3  FERRITIN 598*  TIBC 271  IRON 156  RETICCTPCT 0.7    Dg Chest 1 View  06/24/2015   CLINICAL DATA:  64 year old female with history of chest pain and shortness of breath for the past several weeks.  EXAM: CHEST  1 VIEW  COMPARISON:  Chest x-ray 06/23/2015.  FINDINGS: Lung volumes are normal. No consolidative airspace disease. No pleural effusions. No pneumothorax. No pulmonary nodule or mass noted. Pulmonary vasculature and the cardiomediastinal silhouette are within normal limits.  IMPRESSION: No radiographic evidence of acute cardiopulmonary disease.   Electronically Signed   By: Vinnie Langton M.D.   On: 06/24/2015 08:17   Dg Chest 2 View  06/23/2015   CLINICAL DATA:  Chest pain for the last few weeks. Worsening shortness of breath.  EXAM: CHEST  2 VIEW  COMPARISON:  PA and lateral chest 09/10/2009.  FINDINGS: The lungs are clear. Heart size is normal.  No pneumothorax or pleural effusion. Coronary artery stent noted.  IMPRESSION: No acute disease.   Electronically Signed   By: Inge Rise M.D.   On: 06/23/2015 18:51     Echo pending  TELEMETRY: Sinus rhythm:  ASSESSMENT AND PLAN:  Principal Problem:   Chest pain with high risk for cardiac etiology Active Problems:   Anemia    64 year old female, with known coronary disease, with chest pain, related to anemia, improved after blood transfusion, with negative troponin.  Recommendations  1. Agree with overall current therapy 2. Continue dual antiplatelet therapy 3. May discharge home for follow-up in one week   Acie Custis, MD, PhD, [facility] 06/25/2015 11:44 AM

## 2015-06-25 NOTE — Consult Note (Signed)
Consultation  Referring Provider: Dr. Volanda Napoleon  Primary Care Physician:  [name], MD Consulting  Gastroenterologist:  Dr. Lucita Lora       Reason for Consultation: anemia, rectal bleeding             HPI:   [name] is a 64 y.o. female  Who has anemia and chest pain suggestive of angina.  Past Medical History  Diagnosis Date  . DIABETES MELLITUS, II, COMPLICATIONS 08/17/2992  . HYPERLIPIDEMIA 02/27/2007  . 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 ANEMIA, OTHER, UNSPECIFIED 02/27/2007  . DEPRESSIVE DISORDER, NOS 02/27/2007  . HYPERTENSION, BENIGN SYSTEMIC 02/27/2007  . CORONARY, ARTERIOSCLEROSIS 02/27/2007  . TIA 05/17/2009  . GASTROESOPHAGEAL REFLUX, NO ESOPHAGITIS 02/27/2007  . RENAL INSUFFICIENCY, CHRONIC 12/28/2009  . Renal failure, acute on chronic 01/25/2014  . Blood transfusion without reported diagnosis   . Anemia     Past Surgical History  Procedure Laterality Date  . Partial hysterectomy    . Rectocele repair    . Bladder surgery      Tack    Family History  Problem Relation Age of Onset  . Cancer Mother     unknown  . Cancer Sister     unknown cancer ?breast ca  . CAD Mother      History  Substance Use Topics  . Smoking status: Never Smoker   . Smokeless tobacco: Never Used  . Alcohol Use: No    Prior to Admission medications   Medication Sig Start Date End Date Taking? Authorizing Provider  aspirin 81 MG tablet Take 81 mg by mouth daily.     Yes Historical Provider, MD  Cholecalciferol (VITAMIN D-3 PO) Take 5,000 Int'l Units by mouth daily.     Yes Historical Provider, MD  clopidogrel (PLAVIX) 75 MG tablet Take 75 mg by mouth daily.     Yes Historical Provider, MD  dicyclomine (BENTYL) 10 MG capsule Take 10 mg by mouth 2 (two) times daily at 10 AM and 5 PM.   Yes Historical Provider, MD  FLUoxetine (PROZAC) 20 MG capsule Take 20 mg by mouth daily.   Yes Historical Provider, MD  gabapentin (NEURONTIN) 300 MG capsule Take 600 mg by mouth 3 (three) times  daily.    Yes Historical Provider, MD  insulin aspart (NOVOLOG) 100 UNIT/ML injection Inject 4-10 Units into the skin 3 (three) times daily with meals. Sliding scale   Yes Historical Provider, MD  insulin glargine (LANTUS) 100 UNIT/ML injection Inject 13 Units into the skin daily.  04/20/11 06/23/15 Yes Lucille Passy, MD  levofloxacin (LEVAQUIN) 750 MG tablet Take 1 tablet (750 mg total) by mouth daily. 06/20/15  Yes Lisa Roca, MD  lisinopril (PRINIVIL,ZESTRIL) 2.5 MG tablet Take 2.5 mg by mouth daily.   Yes Historical Provider, MD  simvastatin (ZOCOR) 20 MG tablet Take 20 mg by mouth at bedtime.    Yes Historical Provider, MD  topiramate (TOPAMAX) 100 MG tablet Take 100 mg by mouth 2 (two) times daily.    Yes Historical Provider, MD  ALPRAZolam Duanne Moron) 1 MG tablet 1 tab by mouth 3 times a day as needed for anxiety 03/30/11   Lucille Passy, MD  ketoconazole (NIZORAL) 2 % cream Apply 1 application topically daily. 06/25/14   Bronson Ing, DPM  Naftifine HCl (NAFTIN) 2 % CREA Apply 1 application topically 1 day or 1 dose. 06/10/14   Bronson Ing, DPM  nitroGLYCERIN (NITROSTAT) 0.4 MG SL tablet Place 0.4 mg under the tongue every 5 (  five) minutes as needed.      Historical Provider, MD  promethazine (PHENERGAN) 25 MG tablet Take 25 mg by mouth. by mouth every six hours as needed for nausea 04/02/11   Lucille Passy, MD    Current Facility-Administered Medications  Medication Dose Route Frequency Provider Last Rate Last Dose  . acetaminophen (TYLENOL) tablet 650 mg  650 mg Oral Q6H PRN Bettey Costa, MD   650 mg at 06/24/15 9381   Or  . acetaminophen (TYLENOL) suppository 650 mg  650 mg Rectal Q6H PRN Sital Mody, MD      . albuterol (PROVENTIL) (2.5 MG/3ML) 0.083% nebulizer solution 2.5 mg  2.5 mg Nebulization Q6H PRN Harrie Foreman, MD      . ALPRAZolam Duanne Moron) tablet 0.25 mg  0.25 mg Oral TID PRN Aldean Jewett, MD   0.25 mg at 06/25/15 1047  . alum & mag hydroxide-simeth (MAALOX/MYLANTA)  200-200-20 MG/5ML suspension 30 mL  30 mL Oral Q6H PRN Bettey Costa, MD      . aspirin EC tablet 81 mg  81 mg Oral Daily Bettey Costa, MD   81 mg at 06/25/15 0909  . clopidogrel (PLAVIX) tablet 75 mg  75 mg Oral Daily Bettey Costa, MD   75 mg at 06/25/15 0909  . dicyclomine (BENTYL) capsule 10 mg  10 mg Oral BID Bettey Costa, MD   10 mg at 06/25/15 0909  . FLUoxetine (PROZAC) capsule 20 mg  20 mg Oral Daily Bettey Costa, MD   20 mg at 06/25/15 0909  . gabapentin (NEURONTIN) capsule 600 mg  600 mg Oral TID Bettey Costa, MD   600 mg at 06/25/15 0909  . hydrALAZINE (APRESOLINE) injection 10 mg  10 mg Intravenous Q6H PRN Bettey Costa, MD   10 mg at 06/24/15 0175  . insulin aspart (novoLOG) injection 2 Units  2 Units Subcutaneous TID WC Theodoro Grist, MD      . insulin glargine (LANTUS) injection 13 Units  13 Units Subcutaneous Daily Bettey Costa, MD   13 Units at 06/25/15 0910  . lisinopril (PRINIVIL,ZESTRIL) tablet 2.5 mg  2.5 mg Oral Daily Bettey Costa, MD   2.5 mg at 06/25/15 0909  . nitroGLYCERIN (NITROGLYN) 2 % ointment 0.5 inch  0.5 inch Topical 4 times per day Harrie Foreman, MD   0.5 inch at 06/25/15 1323  . ondansetron (ZOFRAN) tablet 4 mg  4 mg Oral Q6H PRN Bettey Costa, MD       Or  . ondansetron (ZOFRAN) injection 4 mg  4 mg Intravenous Q6H PRN Sital Mody, MD      . pantoprazole (PROTONIX) EC tablet 40 mg  40 mg Oral Daily Aldean Jewett, MD   40 mg at 06/25/15 0909  . senna-docusate (Senokot-S) tablet 1 tablet  1 tablet Oral QHS PRN Bettey Costa, MD      . simvastatin (ZOCOR) tablet 20 mg  20 mg Oral QHS Bettey Costa, MD   20 mg at 06/24/15 2144  . sodium chloride 0.9 % injection 3 mL  3 mL Intravenous Q12H Bettey Costa, MD   3 mL at 06/25/15 0911  . topiramate (TOPAMAX) tablet 100 mg  100 mg Oral BID Bettey Costa, MD   100 mg at 06/25/15 1025    Allergies as of 06/23/2015 - Review Complete 06/23/2015  Allergen Reaction Noted  . Bisphosphonates  03/08/2008  . Codeine phosphate    . Hydralazine hcl   04/13/2010  . Penicillins    . Sulfa antibiotics  Itching 11/16/2011     Review of Systems:    A 12 system review was obtained and all negative except where noted in HPI. Patient with chest pressure causing SOB when her blood count gets too low.  She has had multiple transfusions in the past, was followed in [place] and now at [facility] cancer center, has diagnosis of monoclonal gammopathy and anemia. ROS: positive for dysphagia, SOB, chest pain, diarrhea but not nocturnal, some bleeding with bowels, some weight loss, no hematemesis, no melena, no dysuria, no hematuria, 40 year history of diabetes, hx of TIA 4 years ago.  Denies alcohol or cigarettes.    Physical Exam:  Vital signs in last 24 hours: Temp:  [98.1 F (36.7 C)-98.4 F (36.9 C)] 98.4 F (36.9 C) (06/25 1124) Pulse Rate:  [75-92] 75 (06/25 1124) Resp:  [17-20] 17 (06/25 1124) BP: (140-194)/(57-74) 140/57 mmHg (06/25 1124) SpO2:  [100 %] 100 % (06/25 1124) Weight:  [56.518 kg (124 lb 9.6 oz)] 56.518 kg (124 lb 9.6 oz) (06/25 0543) Last BM Date: 06/24/15  General:  Thin WF with some memory deficit for past history Head:  Head without obvious abnormality, atraumatic  Eyes:   Conjunctiva pink, sclera anicteric   ENT:   Mouth free of lesions, mucosa moist, tongue pink, no thrush noted,  Neck:   Supple w/o thyromegaly or mass, trachea midline, no adenopathy  Lungs: Clear to auscultation bilaterally, respirations unlabored Heart:     Normal S1S2, no rubs, murmurs, gallops. Abdomen: Soft, some tenderness in LLQ, no hepatosplenomegaly, hernia, or mass and BS normal normal, Rectal: Deferred Lymph:  No cervical or supraclavicular adenopathy. Extremities:   No edema, cyanosis, or clubbing Skin  Skin color, texture, turgor normal, no rashes or lesions Neuro:  A&O x 3. CNII-XII intact, normal strength Psych:  Appropriate mood and affect.  Data Reviewed:  LAB RESULTS:  Recent Labs  06/24/15 0431 06/25/15 0502  WBC 4.4 3.9   HGB 10.1* 10.0*  HCT 30.5* 29.1*  PLT 126* 141*   BMET  Recent Labs  06/24/15 0431 06/25/15 0502  NA 142 142  K 3.8 4.6  CL 114* 114*  CO2 24 25  GLUCOSE 194* 237*  BUN 40* 37*  CREATININE 2.05* 2.05*  CALCIUM 8.6* 8.6*   LFT No results for input(s): PROT, ALBUMIN, AST, ALT, ALKPHOS, BILITOT, BILIDIR, IBILI in the last 72 hours. PT/INR No results for input(s): LABPROT, INR in the last 72 hours.  STUDIES: Dg Chest 1 View  06/24/2015   CLINICAL DATA:  64 year old female with history of chest pain and shortness of breath for the past several weeks.  EXAM: CHEST  1 VIEW  COMPARISON:  Chest x-ray 06/23/2015.  FINDINGS: Lung volumes are normal. No consolidative airspace disease. No pleural effusions. No pneumothorax. No pulmonary nodule or mass noted. Pulmonary vasculature and the cardiomediastinal silhouette are within normal limits.  IMPRESSION: No radiographic evidence of acute cardiopulmonary disease.   Electronically Signed   By: Vinnie Langton M.D.   On: 06/24/2015 08:17   Dg Chest 2 View  06/23/2015   CLINICAL DATA:  Chest pain for the last few weeks. Worsening shortness of breath.  EXAM: CHEST  2 VIEW  COMPARISON:  PA and lateral chest 09/10/2009.  FINDINGS: The lungs are clear. Heart size is normal. No pneumothorax or pleural effusion. Coronary artery stent noted.  IMPRESSION: No acute disease.   Electronically Signed   By: Inge Rise M.D.   On: 06/23/2015 18:51     Assessment:  [name] is a 64 y.o. WF with hx of chronic anemia requiring multiple transfusions who gets chest pain suggestive of angina when hgb get low.  Also with rectal bleeding.  Plan: Follow up in my office after sees Dr. Saralyn Pilar a week after discharge.  Get records from [facility].  Needs colonoscopy and probably upper endo as well.  Gaylyn Cheers, MD   06/25/2015, 3:52 PM

## 2015-06-25 NOTE — Progress Notes (Signed)
[facility]  Date of admission:  06/23/2015  Inpatient day:  06/25/2015  Consulting physician: Dr. Theodoro Grist  Chief Complaint: [name] is a 64 y.o. female with anemia of chronic renal disease admitted with shortness of breath and chest pain.  HPI: The patient notes a long-standing history of anemia. She notes a history of being transfused with "gobs of blood". She previously saw physicians in [facility] as well as [facility]. She previously saw Dr Lamonte Sakai prior to being seen at [facility] by Dr. Grayland Ormond. She states that she has had 8 bone marrow aspirates and biopsies. The last was "a long time ago". Dr. Grayland Ormond has followed her in clinic for the past 2 years.  Dr. Grayland Ormond has done workup. She had a normal B12, folate, and iron studies. She has a monoclonal gammopathy of unknown significance (MGUS).  SPEP on 06/03/2014 revealed a 0.5 g/dL IgG monoclonal protein with kappa light chain specificity. Plan was for SPEP every 6 months. Patient last saw Dr. Grayland Ormond on 02/08/2015. She last received her monthly erythropoietin 40,000 units on 05/27/2015.  She presented to the emergency room with weakness, shortness of breath, and chest pain. Hemoglobin was 7.1. Hemoglobin typically ranges between 7.3 and 8.3. She received 1 unit of packed red blood cells. She states that her symptoms have resolved.  She notes that physicians at [facility] kept her hemoglobin greater than 10.   She had last had a colonoscopy and EGD in 2005. These studies were negative. For an unspecified period of time, she notes loose bowels, melena and hematochezia.  She states that she is unable to have an endoscopy per recommendations of her physician. States that she is seen by cardiology at [facility]. She is on Plavix and aspirin. She states that if she had these procedures she would "bleed to death" due to her chronic anemia and blood thinning.  Past Medical History  Diagnosis  Date  . DIABETES MELLITUS, II, COMPLICATIONS 8/75/6433  . HYPERLIPIDEMIA 02/27/2007  . 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 ANEMIA, OTHER, UNSPECIFIED 02/27/2007  . DEPRESSIVE DISORDER, NOS 02/27/2007  . HYPERTENSION, BENIGN SYSTEMIC 02/27/2007  . CORONARY, ARTERIOSCLEROSIS 02/27/2007  . TIA 05/17/2009  . GASTROESOPHAGEAL REFLUX, NO ESOPHAGITIS 02/27/2007  . RENAL INSUFFICIENCY, CHRONIC 12/28/2009  . Renal failure, acute on chronic 01/25/2014  . Blood transfusion without reported diagnosis   . Anemia     Past Surgical History  Procedure Laterality Date  . Partial hysterectomy    . Rectocele repair    . Bladder surgery      Tack    Family History  Problem Relation Age of Onset  . Cancer Mother     unknown  . Cancer Sister     unknown cancer ?breast ca  . CAD Mother     Social History:  reports that she has never smoked. She has never used smokeless tobacco. She reports that she does not drink alcohol or use illicit drugs.  She lives alone in [place].  She is alone today.  Allergies:  Allergies  Allergen Reactions  . Bisphosphonates   . Codeine Phosphate     REACTION: Nausea and vomiting  . Hydralazine Hcl     REACTION: swelling  . Penicillins     REACTION: Itching  . Sulfa Antibiotics Itching    Medications Prior to Admission  Medication Sig Dispense Refill  . aspirin 81 MG tablet Take 81 mg by mouth daily.      . Cholecalciferol (VITAMIN  D-3 PO) Take 5,000 Int'l Units by mouth daily.      . clopidogrel (PLAVIX) 75 MG tablet Take 75 mg by mouth daily.      Marland Kitchen dicyclomine (BENTYL) 10 MG capsule Take 10 mg by mouth 2 (two) times daily at 10 AM and 5 PM.    . FLUoxetine (PROZAC) 20 MG capsule Take 20 mg by mouth daily.    Marland Kitchen gabapentin (NEURONTIN) 300 MG capsule Take 600 mg by mouth 3 (three) times daily.     . insulin aspart (NOVOLOG) 100 UNIT/ML injection Inject 4-10 Units into the skin 3 (three) times daily with meals. Sliding scale    . insulin glargine (LANTUS) 100  UNIT/ML injection Inject 13 Units into the skin daily.     Marland Kitchen levofloxacin (LEVAQUIN) 750 MG tablet Take 1 tablet (750 mg total) by mouth daily. 4 tablet 0  . lisinopril (PRINIVIL,ZESTRIL) 2.5 MG tablet Take 2.5 mg by mouth daily.    . simvastatin (ZOCOR) 20 MG tablet Take 20 mg by mouth at bedtime.     . topiramate (TOPAMAX) 100 MG tablet Take 100 mg by mouth 2 (two) times daily.     Marland Kitchen ALPRAZolam (XANAX) 1 MG tablet 1 tab by mouth 3 times a day as needed for anxiety 90 tablet 0  . ketoconazole (NIZORAL) 2 % cream Apply 1 application topically daily. 60 g 2  . Naftifine HCl (NAFTIN) 2 % CREA Apply 1 application topically 1 day or 1 dose. 60 g 2  . nitroGLYCERIN (NITROSTAT) 0.4 MG SL tablet Place 0.4 mg under the tongue every 5 (five) minutes as needed.      . promethazine (PHENERGAN) 25 MG tablet Take 25 mg by mouth. by mouth every six hours as needed for nausea      Review of Systems: GENERAL:  Fatigue.  No fevers, sweats or weight loss.  At times, feels hot and flushed. PERFORMANCE STATUS (ECOG): 1-2 HEENT:  Runny nose.  No visual changes, sore throat, mouth sores or tenderness. Lungs: No shortness of breath or cough.  No hemoptysis. Cardiac:  Chest pain prompting admission. No palpitations, orthopnea, or PND. GI:  Three years of loose bowels ("at times slows down").  Melena and hematochezia.  No nausea, vomiting, or constipation. GU:  No urgency, frequency, dysuria, or hematuria. Musculoskeletal:  Back and neck pain.  Bulging disks.  No muscle tenderness. Extremities:  No pain or swelling. Skin:  No rashes or skin changes. Neuro:  Headache at times .  No numbness or weakness, balance or coordination issues. Endocrine:  No diabetes, thyroid issues, hot flashes or night sweats. Psych:  No mood changes, depression or anxiety. Pain:  No focal pain. Review of systems:  All other systems reviewed and found to be negative.   Physical Exam:  Blood pressure 140/57, pulse 75, temperature 98.4  F (36.9 C), temperature source Oral, resp. rate 17, height '5\' 3"'  (1.6 m), weight 124 lb 9.6 oz (56.518 kg), SpO2 100 %.  GENERAL:  Thin fatigued appearing woman sitting comfortably on the medical unit in no acute distress.  She is eating breakfast. MENTAL STATUS:  Alert and oriented to person, place and time. HEAD:  Long gray hair.  Normocephalic, atraumatic, face symmetric, no Cushingoid features. EYES:  Green/hazel eyes.  Pupils equal round and reactive to light and accomodation.  No conjunctivitis or scleral icterus. ENT:  Oropharynx clear without lesion.  Tongue normal. Mucous membranes moist.  RESPIRATORY:  Clear to auscultation without rales, wheezes or rhonchi.  CARDIOVASCULAR:  Regular rate and rhythm without murmur, rub or gallop. ABDOMEN:  Soft, non-tender, with active bowel sounds, and no hepatosplenomegaly.  No masses. SKIN:  No rashes, ulcers or lesions. EXTREMITIES: No edema, no skin discoloration or tenderness.  No palpable cords.  Scattered SQ nodules. LYMPH NODES: No palpable cervical, supraclavicular, axillary or inguinal adenopathy  NEUROLOGICAL: Unremarkable. PSYCH:  Appropriate.   Results for orders placed or performed during the hospital encounter of 06/23/15 (from the past 48 hour(s))  CBC     Status: Abnormal   Collection Time: 06/23/15  4:35 PM  Result Value Ref Range   WBC 3.3 (L) 3.6 - 11.0 K/uL   RBC 2.36 (L) 3.80 - 5.20 MIL/uL   Hemoglobin 7.7 (L) 12.0 - 16.0 g/dL   HCT 24.0 (L) 35.0 - 47.0 %   MCV 102.0 (H) 80.0 - 100.0 fL   MCH 32.6 26.0 - 34.0 pg   MCHC 32.0 32.0 - 36.0 g/dL   RDW 13.8 11.5 - 14.5 %   Platelets 138 (L) 150 - 440 K/uL  Basic metabolic panel     Status: Abnormal   Collection Time: 06/23/15  4:35 PM  Result Value Ref Range   Sodium 137 135 - 145 mmol/L   Potassium 4.7 3.5 - 5.1 mmol/L   Chloride 108 101 - 111 mmol/L   CO2 21 (L) 22 - 32 mmol/L   Glucose, Bld 392 (H) 65 - 99 mg/dL   BUN 46 (H) 6 - 20 mg/dL   Creatinine, Ser 2.52 (H)  0.44 - 1.00 mg/dL   Calcium 8.5 (L) 8.9 - 10.3 mg/dL   GFR calc non Af Amer 19 (L) >60 mL/min   GFR calc Af Amer 22 (L) >60 mL/min    Comment: (NOTE) The eGFR has been calculated using the CKD EPI equation. This calculation has not been validated in all clinical situations. eGFR's persistently <60 mL/min signify possible Chronic Kidney Disease.    Anion gap 8 5 - 15  Troponin I     Status: None   Collection Time: 06/23/15  4:35 PM  Result Value Ref Range   Troponin I <0.03 <0.031 ng/mL    Comment:        NO INDICATION OF MYOCARDIAL INJURY.   Prepare RBC     Status: None   Collection Time: 06/23/15  7:03 PM  Result Value Ref Range   Order Confirmation ORDER PROCESSED BY BLOOD BANK   Type and screen     Status: None   Collection Time: 06/23/15  7:03 PM  Result Value Ref Range   ABO/RH(D) O POS    Antibody Screen NEG    Sample Expiration 06/26/2015    Unit Number [id number]    Blood Component Type RED CELLS,LR    Unit division 00    Status of Unit ISSUED,FINAL    Transfusion Status OK TO TRANSFUSE    Crossmatch Result Compatible   Glucose, capillary     Status: Abnormal   Collection Time: 06/23/15  8:56 PM  Result Value Ref Range   Glucose-Capillary 339 (H) 65 - 99 mg/dL  CK     Status: None   Collection Time: 06/24/15  1:46 AM  Result Value Ref Range   Total CK 65 38 - 234 U/L  Troponin I     Status: None   Collection Time: 06/24/15  1:46 AM  Result Value Ref Range   Troponin I <0.03 <0.031 ng/mL    Comment:  NO INDICATION OF MYOCARDIAL INJURY.   Vitamin B12     Status: None   Collection Time: 06/24/15  1:46 AM  Result Value Ref Range   Vitamin B-12 549 180 - 914 pg/mL    Comment: (NOTE) This assay is not validated for testing neonatal or myeloproliferative syndrome specimens for Vitamin B12 levels. Performed at [facility]   Folate     Status: None   Collection Time: 06/24/15  1:46 AM  Result Value Ref Range   Folate 11.3 >5.9 ng/mL   Iron and TIBC     Status: Abnormal   Collection Time: 06/24/15  1:46 AM  Result Value Ref Range   Iron 156 28 - 170 ug/dL   TIBC 271 250 - 450 ug/dL   Saturation Ratios 58 (H) 10.4 - 31.8 %   UIBC 115 ug/dL  Ferritin     Status: Abnormal   Collection Time: 06/24/15  1:46 AM  Result Value Ref Range   Ferritin 598 (H) 11 - 307 ng/mL  Reticulocytes     Status: Abnormal   Collection Time: 06/24/15  1:46 AM  Result Value Ref Range   Retic Ct Pct 0.7 0.4 - 3.1 %   RBC. 3.04 (L) 3.80 - 5.20 MIL/uL   Retic Count, Manual 21.3 19.0 - 183.0 K/uL  Lipid panel     Status: Abnormal   Collection Time: 06/24/15  4:31 AM  Result Value Ref Range   Cholesterol 97 0 - 200 mg/dL   Triglycerides 87 <150 mg/dL   HDL 28 (L) >40 mg/dL   Total CHOL/HDL Ratio 3.5 RATIO   VLDL 17 0 - 40 mg/dL   LDL Cholesterol 52 0 - 99 mg/dL    Comment:        Total Cholesterol/HDL:CHD Risk Coronary Heart Disease Risk Table                     Men   Women  1/2 Average Risk   3.4   3.3  Average Risk       5.0   4.4  2 X Average Risk   9.6   7.1  3 X Average Risk  23.4   11.0        Use the calculated Patient Ratio above and the CHD Risk Table to determine the patient's CHD Risk.        ATP III CLASSIFICATION (LDL):  <100     mg/dL   Optimal  100-129  mg/dL   Near or Above                    Optimal  130-159  mg/dL   Borderline  160-189  mg/dL   High  >190     mg/dL   Very High   CBC     Status: Abnormal   Collection Time: 06/24/15  4:31 AM  Result Value Ref Range   WBC 4.4 3.6 - 11.0 K/uL   RBC 3.14 (L) 3.80 - 5.20 MIL/uL   Hemoglobin 10.1 (L) 12.0 - 16.0 g/dL   HCT 30.5 (L) 35.0 - 47.0 %   MCV 97.1 80.0 - 100.0 fL   MCH 32.2 26.0 - 34.0 pg   MCHC 33.2 32.0 - 36.0 g/dL   RDW 15.2 (H) 11.5 - 14.5 %   Platelets 126 (L) 150 - 440 K/uL  Basic metabolic panel     Status: Abnormal   Collection Time: 06/24/15  4:31 AM  Result Value Ref  Range   Sodium 142 135 - 145 mmol/L   Potassium 3.8 3.5 - 5.1 mmol/L    Chloride 114 (H) 101 - 111 mmol/L   CO2 24 22 - 32 mmol/L   Glucose, Bld 194 (H) 65 - 99 mg/dL   BUN 40 (H) 6 - 20 mg/dL   Creatinine, Ser 2.05 (H) 0.44 - 1.00 mg/dL   Calcium 8.6 (L) 8.9 - 10.3 mg/dL   GFR calc non Af Amer 24 (L) >60 mL/min   GFR calc Af Amer 28 (L) >60 mL/min    Comment: (NOTE) The eGFR has been calculated using the CKD EPI equation. This calculation has not been validated in all clinical situations. eGFR's persistently <60 mL/min signify possible Chronic Kidney Disease.    Anion gap 4 (L) 5 - 15  Troponin I     Status: None   Collection Time: 06/24/15  4:31 AM  Result Value Ref Range   Troponin I <0.03 <0.031 ng/mL    Comment:        NO INDICATION OF MYOCARDIAL INJURY.   Glucose, capillary     Status: None   Collection Time: 06/24/15  7:15 AM  Result Value Ref Range   Glucose-Capillary 78 65 - 99 mg/dL  Glucose, capillary     Status: Abnormal   Collection Time: 06/24/15 11:54 AM  Result Value Ref Range   Glucose-Capillary 156 (H) 65 - 99 mg/dL  Glucose, capillary     Status: Abnormal   Collection Time: 06/24/15  4:26 PM  Result Value Ref Range   Glucose-Capillary 42 (LL) 65 - 99 mg/dL  Glucose, capillary     Status: Abnormal   Collection Time: 06/24/15  5:07 PM  Result Value Ref Range   Glucose-Capillary 110 (H) 65 - 99 mg/dL  Glucose, capillary     Status: Abnormal   Collection Time: 06/24/15  7:51 PM  Result Value Ref Range   Glucose-Capillary 183 (H) 65 - 99 mg/dL  Glucose, capillary     Status: Abnormal   Collection Time: 06/25/15  2:22 AM  Result Value Ref Range   Glucose-Capillary 43 (LL) 65 - 99 mg/dL   Comment 1 Notify RN   Glucose, capillary     Status: None   Collection Time: 06/25/15  3:03 AM  Result Value Ref Range   Glucose-Capillary 80 65 - 99 mg/dL   Comment 1 Notify RN   Basic metabolic panel     Status: Abnormal   Collection Time: 06/25/15  5:02 AM  Result Value Ref Range   Sodium 142 135 - 145 mmol/L   Potassium 4.6 3.5  - 5.1 mmol/L   Chloride 114 (H) 101 - 111 mmol/L   CO2 25 22 - 32 mmol/L   Glucose, Bld 237 (H) 65 - 99 mg/dL   BUN 37 (H) 6 - 20 mg/dL   Creatinine, Ser 2.05 (H) 0.44 - 1.00 mg/dL   Calcium 8.6 (L) 8.9 - 10.3 mg/dL   GFR calc non Af Amer 24 (L) >60 mL/min   GFR calc Af Amer 28 (L) >60 mL/min    Comment: (NOTE) The eGFR has been calculated using the CKD EPI equation. This calculation has not been validated in all clinical situations. eGFR's persistently <60 mL/min signify possible Chronic Kidney Disease.    Anion gap 3 (L) 5 - 15  CBC     Status: Abnormal   Collection Time: 06/25/15  5:02 AM  Result Value Ref Range   WBC 3.9 3.6 - 11.0  K/uL   RBC 2.99 (L) 3.80 - 5.20 MIL/uL   Hemoglobin 10.0 (L) 12.0 - 16.0 g/dL   HCT 29.1 (L) 35.0 - 47.0 %   MCV 97.4 80.0 - 100.0 fL   MCH 33.3 26.0 - 34.0 pg   MCHC 34.2 32.0 - 36.0 g/dL   RDW 15.6 (H) 11.5 - 14.5 %   Platelets 141 (L) 150 - 440 K/uL  Glucose, capillary     Status: Abnormal   Collection Time: 06/25/15  7:32 AM  Result Value Ref Range   Glucose-Capillary 168 (H) 65 - 99 mg/dL   Comment 1 Notify RN   Occult blood card to lab, stool RN will collect     Status: None   Collection Time: 06/25/15  9:41 AM  Result Value Ref Range   Fecal Occult Bld NEGATIVE NEGATIVE  Glucose, capillary     Status: Abnormal   Collection Time: 06/25/15 11:45 AM  Result Value Ref Range   Glucose-Capillary 190 (H) 65 - 99 mg/dL   Comment 1 Notify RN    Dg Chest 1 View  06/24/2015   CLINICAL DATA:  64 year old female with history of chest pain and shortness of breath for the past several weeks.  EXAM: CHEST  1 VIEW  COMPARISON:  Chest x-ray 06/23/2015.  FINDINGS: Lung volumes are normal. No consolidative airspace disease. No pleural effusions. No pneumothorax. No pulmonary nodule or mass noted. Pulmonary vasculature and the cardiomediastinal silhouette are within normal limits.  IMPRESSION: No radiographic evidence of acute cardiopulmonary disease.    Electronically Signed   By: Vinnie Langton M.D.   On: 06/24/2015 08:17   Dg Chest 2 View  06/23/2015   CLINICAL DATA:  Chest pain for the last few weeks. Worsening shortness of breath.  EXAM: CHEST  2 VIEW  COMPARISON:  PA and lateral chest 09/10/2009.  FINDINGS: The lungs are clear. Heart size is normal. No pneumothorax or pleural effusion. Coronary artery stent noted.  IMPRESSION: No acute disease.   Electronically Signed   By: Inge Rise M.D.   On: 06/23/2015 18:51    Assessment:  The patient is a 64 y.o. woman with anermia of chronic renal disease who presented with symptomatic anemia. 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  She receives erythropoietin monthly (last 05/27/2015).  She notes a history of melena and hematochezia.  Last colonoscopy and EGD were in 2005.  She states that's she can not have an endoscopy because of cardiovascular reasons.  She likely requires a higher hematocrit (27-30) to remain asymptomatic.  Plan:   1)  Labs:  SPEP, kappa/lambda free light chains. 2)  Collect 24 hour urine for UPEP.  3)  Cardiology clearance for endoscopy as an outpatient.   Thank you for allowing me to participate in [name] 's care.  I will follow her closely with you while hospitalized.  After discharge, she will be followed by Dr. Grayland Ormond in the outpatient department.  Lequita Asal, MD  06/25/2015, 12:34 PM

## 2015-06-25 NOTE — Progress Notes (Signed)
Provided patient with apple juice, and graham crackers. Pt refused orange juice. Provider aware low blood sugar.

## 2015-06-25 NOTE — Progress Notes (Signed)
[facility] [place] NAME: [name]    MR#:  [id number]  DATE OF BIRTH:  1951/05/20  SUBJECTIVE:  CHIEF COMPLAINT:   Chief Complaint  Patient presents with  . Chest Pain   Presented with chest pains, however, has some discomfort and upper abdomen. Last EGD approximately 10 years ago which was unremarkable. Need for gastroenterologic workup. However, cardiology recommends outpatient workup including functional study. Dr. Mike Gip recommends 24-hour urine collection for evaluation of proteinuria for Multiple myeloma.   REVIEW OF SYSTEMS:   Review of Systems  Constitutional: Positive for malaise/fatigue. Negative for fever.  Respiratory: Negative for shortness of breath.   Cardiovascular: Positive for chest pain. Negative for palpitations, claudication, leg swelling and PND.  Gastrointestinal: Negative for nausea, vomiting and abdominal pain.  Genitourinary: Negative for dysuria.  Neurological: Positive for weakness.    DRUG ALLERGIES:   Allergies  Allergen Reactions  . Bisphosphonates   . Codeine Phosphate     REACTION: Nausea and vomiting  . Hydralazine Hcl     REACTION: swelling  . Penicillins     REACTION: Itching  . Sulfa Antibiotics Itching    VITALS:  Blood pressure 140/57, pulse 75, temperature 98.4 F (36.9 C), temperature source Oral, resp. rate 17, height '5\' 3"'  (1.6 m), weight 56.518 kg (124 lb 9.6 oz), SpO2 100 %.  PHYSICAL EXAMINATION:  GENERAL:  64 y.o.-year-old patient lying in the bed with no acute distress. Pale and fatigued EYES: Pupils equal, round, reactive to light and accommodation. No scleral icterus. Extraocular muscles intact.  HEENT: Head atraumatic, normocephalic. Oropharynx and nasopharynx clear.  NECK:  Supple, no jugular venous distention. No thyroid enlargement, no tenderness.  LUNGS: Normal breath sounds bilaterally, no wheezing, rales,rhonchi or crepitation. No use of accessory  muscles of respiration.  CARDIOVASCULAR: S1, S2 normal. No murmurs, rubs, or gallops.  ABDOMEN: Soft, mild discomfort in the upper abdomen with some voluntary guarding,  Mildly distended. Bowel sounds present. No organomegaly or mass.  EXTREMITIES: No pedal edema, cyanosis, or clubbing.  NEUROLOGIC: Cranial nerves II through XII are intact. Muscle strength 5/5 in all extremities. Sensation intact. Gait not checked.  PSYCHIATRIC: The patient is alert and oriented x 3.  SKIN: No obvious rash, lesion, or ulcer.    LABORATORY PANEL:   CBC  Recent Labs Lab 06/25/15 0502  WBC 3.9  HGB 10.0*  HCT 29.1*  PLT 141*   ------------------------------------------------------------------------------------------------------------------  Chemistries   Recent Labs Lab 06/25/15 0502  NA 142  K 4.6  CL 114*  CO2 25  GLUCOSE 237*  BUN 37*  CREATININE 2.05*  CALCIUM 8.6*   ------------------------------------------------------------------------------------------------------------------  Cardiac Enzymes  Recent Labs Lab 06/24/15 0431  TROPONINI <0.03   ------------------------------------------------------------------------------------------------------------------  RADIOLOGY:  Dg Chest 1 View  06/24/2015   CLINICAL DATA:  64 year old female with history of chest pain and shortness of breath for the past several weeks.  EXAM: CHEST  1 VIEW  COMPARISON:  Chest x-ray 06/23/2015.  FINDINGS: Lung volumes are normal. No consolidative airspace disease. No pleural effusions. No pneumothorax. No pulmonary nodule or mass noted. Pulmonary vasculature and the cardiomediastinal silhouette are within normal limits.  IMPRESSION: No radiographic evidence of acute cardiopulmonary disease.   Electronically Signed   By: Vinnie Langton M.D.   On: 06/24/2015 08:17   Dg Chest 2 View  06/23/2015   CLINICAL DATA:  Chest pain for the last few weeks. Worsening shortness of breath.  EXAM: CHEST  2 VIEW  COMPARISON:  PA and lateral chest 09/10/2009.  FINDINGS: The lungs are clear. Heart size is normal. No pneumothorax or pleural effusion. Coronary artery stent noted.  IMPRESSION: No acute disease.   Electronically Signed   By: Inge Rise M.D.   On: 06/23/2015 18:51    EKG:   Orders placed or performed during the hospital encounter of 06/23/15  . ED EKG (<74mns upon arrival to the ED)  . ED EKG (<174ms upon arrival to the ED)  . EKG 12-Lead  . EKG 12-Lead    ASSESSMENT AND PLAN:   Principal Problem:   Chest pain with high risk for cardiac etiology Active Problems:   Anemia  1) acute on chronic anemia - hematology consultation is appreciated. Further workup for multiple myeloma is initiated, including 24-hour urine collection, discussed is Dr. CoMike Gip patient's primary oncologist is Dr. 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 Getting gastroenterologist involved for further recommendations as well, possible outpatient EGD if cardiology is agreeable. Initiate and continue proton pump inhibitor, Protonix.   2) chest pain - persistant since admission - negative cardiac enzymes, no EKG change, no tele events - Continue PPI as she was not on one at home - [facility] pending[facility] recommended [facility] work[facility]  3) DM - SSI  4) depression and anxiety - continue home medications  5) CKD 4 - stable for past 6 months 6 MGUS, Dr. CoMike Gipecommends 24-hour urine collection for proteinuria evaluation  All the records are reviewed and case discussed with Care Management/Social Workerr. Management plans discussed with the patient, family and they are in agreement.  CODE STATUS: full  TOTAL TIME TAKING CARE OF THIS PATIENT: 40 minutes.   POSSIBLE D/C IN 1-2 DAYS, DEPENDING ON CLINICAL CONDITION.   VATheodoro Grist.D  on 06/25/2015 at 11:32 AM  Between 7am to 6pm - Pager - [phone]  After 6pm go to [email or web address] - password [email or web address] [facility]lement [facility]  Office  [phone]: Primary care physician; HEMaryland PinkMD

## 2015-06-25 NOTE — Progress Notes (Signed)
Paged to Dr. Clayton Bibles. Informed provider of pt's low blood sugar. Pt states home ss differs from hosptial ss. Provider to change ss. Will d/c current ss as instructed by provider.

## 2015-06-26 DIAGNOSIS — D631 Anemia in chronic kidney disease: Secondary | ICD-10-CM | POA: Diagnosis not present

## 2015-06-26 DIAGNOSIS — N184 Chronic kidney disease, stage 4 (severe): Secondary | ICD-10-CM

## 2015-06-26 DIAGNOSIS — I129 Hypertensive chronic kidney disease with stage 1 through stage 4 chronic kidney disease, or unspecified chronic kidney disease: Secondary | ICD-10-CM | POA: Diagnosis not present

## 2015-06-26 DIAGNOSIS — R079 Chest pain, unspecified: Secondary | ICD-10-CM | POA: Diagnosis not present

## 2015-06-26 DIAGNOSIS — N189 Chronic kidney disease, unspecified: Secondary | ICD-10-CM | POA: Diagnosis not present

## 2015-06-26 DIAGNOSIS — D472 Monoclonal gammopathy: Secondary | ICD-10-CM | POA: Diagnosis not present

## 2015-06-26 LAB — GLUCOSE, CAPILLARY
Glucose-Capillary: 327 mg/dL — ABNORMAL HIGH (ref 65–99)
Glucose-Capillary: 274 mg/dL — ABNORMAL HIGH (ref 65–99)

## 2015-06-26 MED ORDER — PANTOPRAZOLE SODIUM 40 MG PO TBEC: 40.0000 mg | DELAYED_RELEASE_TABLET | Freq: Every day | ORAL | Status: DC

## 2015-06-26 MED ORDER — INSULIN GLARGINE 100 UNIT/ML ~~LOC~~ SOLN: 13.0000 [IU] | Freq: Every day | SUBCUTANEOUS | Status: DC

## 2015-06-26 NOTE — Progress Notes (Signed)
[facility] Hematology/Oncology Progress Note  Date of admission: 06/23/2015  Hospital day:  06/26/2015  Chief Complaint: [name] is a 64 y.o. female with anemia of chronic renal disease admitted with shortness of breath and chest pain.  Subjective: The patient is doing well.  She denies any complaints.  Social History: The patient is accompanied by her son and daughter-in-law.  Allergies:  Allergies  Allergen Reactions  . Bisphosphonates   . Codeine Phosphate     REACTION: Nausea and vomiting  . Hydralazine Hcl     REACTION: swelling  . Penicillins     REACTION: Itching  . Sulfa Antibiotics Itching    Scheduled Medications: . aspirin EC  81 mg Oral Daily  . clopidogrel  75 mg Oral Daily  . dicyclomine  10 mg Oral BID  . FLUoxetine  20 mg Oral Daily  . gabapentin  600 mg Oral TID  . insulin aspart  2 Units Subcutaneous TID WC  . insulin glargine  13 Units Subcutaneous Daily  . lisinopril  2.5 mg Oral Daily  . nitroGLYCERIN  0.5 inch Topical 4 times per day  . pantoprazole  40 mg Oral Daily  . simvastatin  20 mg Oral QHS  . sodium chloride  3 mL Intravenous Q12H  . topiramate  100 mg Oral BID    Review of Systems: GENERAL: Fatigue. No fevers, sweats or weight loss. At times, feels hot and flushed. PERFORMANCE STATUS (ECOG): 1-2 HEENT: Runny nose. No visual changes, sore throat, mouth sores or tenderness. Lungs: No shortness of breath or cough. No hemoptysis. Cardiac: Chest pain prompting admission. No current chest pain.  No palpitations, orthopnea, or PND. GI: Three years of loose bowels ("at times slows down"). Melena and hematochezia. No nausea, vomiting, or constipation. GU: No urgency, frequency, dysuria, or hematuria. Musculoskeletal: Back and neck pain. Bulging disks. No muscle tenderness. Extremities: No pain or swelling. Skin: No rashes or skin changes. Neuro: No headache, numbness or weakness, balance or  coordination issues. Endocrine: No diabetes, thyroid issues, hot flashes or night sweats. Psych: No mood changes, depression or anxiety. Pain: No focal pain. Review of systems: All other systems reviewed and found to be negative  Physical Exam: Blood pressure 143/65, pulse 85, temperature 97.8 F (36.6 C), temperature source Oral, resp. rate 18, height '5\' 3"'  (1.6 m), weight 124 lb 9.6 oz (56.518 kg), SpO2 100 %.  GENERAL: Thin fatigued appearing woman sitting comfortably on the medical unit in no acute distress. MENTAL STATUS: Alert and oriented to person, place and time. HEAD: Long gray hair. Normocephalic, atraumatic, face symmetric, no Cushingoid features. EYES: Green/hazel eyes. Pupils equal round and reactive to light and accomodation. No conjunctivitis or scleral icterus. ENT: Oropharynx clear without lesion. Tongue normal. Mucous membranes moist.  RESPIRATORY: Clear to auscultation without rales, wheezes or rhonchi. CARDIOVASCULAR: Regular rate and rhythm without murmur, rub or gallop. ABDOMEN: Soft, non-tender, with active bowel sounds, and no hepatosplenomegaly. No masses. SKIN: No rashes, ulcers or lesions. EXTREMITIES: No edema, no skin discoloration or tenderness. No palpable cords. Scattered SQ nodules. NEUROLOGICAL: Unremarkable. PSYCH: Appropriate.   Results for orders placed or performed during the hospital encounter of 06/23/15 (from the past 48 hour(s))  Glucose, capillary     Status: Abnormal   Collection Time: 06/24/15  4:26 PM  Result Value Ref Range   Glucose-Capillary 42 (LL) 65 - 99 mg/dL  Glucose, capillary     Status: Abnormal   Collection Time: 06/24/15  5:07 PM  Result Value Ref Range   Glucose-Capillary 110 (H) 65 - 99 mg/dL  Glucose, capillary     Status: Abnormal   Collection Time: 06/24/15  7:51 PM  Result Value Ref Range   Glucose-Capillary 183 (H) 65 - 99 mg/dL  Glucose, capillary     Status: Abnormal   Collection Time:  06/25/15  2:22 AM  Result Value Ref Range   Glucose-Capillary 43 (LL) 65 - 99 mg/dL   Comment 1 Notify RN   Glucose, capillary     Status: None   Collection Time: 06/25/15  3:03 AM  Result Value Ref Range   Glucose-Capillary 80 65 - 99 mg/dL   Comment 1 Notify RN   Basic metabolic panel     Status: Abnormal   Collection Time: 06/25/15  5:02 AM  Result Value Ref Range   Sodium 142 135 - 145 mmol/L   Potassium 4.6 3.5 - 5.1 mmol/L   Chloride 114 (H) 101 - 111 mmol/L   CO2 25 22 - 32 mmol/L   Glucose, Bld 237 (H) 65 - 99 mg/dL   BUN 37 (H) 6 - 20 mg/dL   Creatinine, Ser 2.05 (H) 0.44 - 1.00 mg/dL   Calcium 8.6 (L) 8.9 - 10.3 mg/dL   GFR calc non Af Amer 24 (L) >60 mL/min   GFR calc Af Amer 28 (L) >60 mL/min    Comment: (NOTE) The eGFR has been calculated using the CKD EPI equation. This calculation has not been validated in all clinical situations. eGFR's persistently <60 mL/min signify possible Chronic Kidney Disease.    Anion gap 3 (L) 5 - 15  CBC     Status: Abnormal   Collection Time: 06/25/15  5:02 AM  Result Value Ref Range   WBC 3.9 3.6 - 11.0 K/uL   RBC 2.99 (L) 3.80 - 5.20 MIL/uL   Hemoglobin 10.0 (L) 12.0 - 16.0 g/dL   HCT 29.1 (L) 35.0 - 47.0 %   MCV 97.4 80.0 - 100.0 fL   MCH 33.3 26.0 - 34.0 pg   MCHC 34.2 32.0 - 36.0 g/dL   RDW 15.6 (H) 11.5 - 14.5 %   Platelets 141 (L) 150 - 440 K/uL  Glucose, capillary     Status: Abnormal   Collection Time: 06/25/15  7:32 AM  Result Value Ref Range   Glucose-Capillary 168 (H) 65 - 99 mg/dL   Comment 1 Notify RN   Occult blood card to lab, stool RN will collect     Status: None   Collection Time: 06/25/15  9:41 AM  Result Value Ref Range   Fecal Occult Bld NEGATIVE NEGATIVE  Glucose, capillary     Status: Abnormal   Collection Time: 06/25/15 11:45 AM  Result Value Ref Range   Glucose-Capillary 190 (H) 65 - 99 mg/dL   Comment 1 Notify RN   Glucose, capillary     Status: Abnormal   Collection Time: 06/25/15  2:29 PM   Result Value Ref Range   Glucose-Capillary 52 (L) 65 - 99 mg/dL  Glucose, capillary     Status: None   Collection Time: 06/25/15  4:57 PM  Result Value Ref Range   Glucose-Capillary 65 65 - 99 mg/dL   Comment 1 Notify RN   Glucose, capillary     Status: Abnormal   Collection Time: 06/25/15  8:14 PM  Result Value Ref Range   Glucose-Capillary 135 (H) 65 - 99 mg/dL  Glucose, capillary     Status: None   Collection  Time: 06/25/15 11:02 PM  Result Value Ref Range   Glucose-Capillary 72 65 - 99 mg/dL  Glucose, capillary     Status: Abnormal   Collection Time: 06/26/15  7:26 AM  Result Value Ref Range   Glucose-Capillary 327 (H) 65 - 99 mg/dL   Comment 1 Notify RN    Comment 2 Document in Chart   Glucose, capillary     Status: Abnormal   Collection Time: 06/26/15 11:52 AM  Result Value Ref Range   Glucose-Capillary 274 (H) 65 - 99 mg/dL   Comment 1 Notify RN    Comment 2 Document in Chart    No results found.  Assessment:  [name] is a 64 y.o. female with anermia of chronic renal disease who presented with symptomatic anemia. 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 She receives erythropoietin monthly (last 05/27/2015).  She notes a history of melena and hematochezia. Last colonoscopy and EGD were in 2005. She states that's she can not have an endoscopy because of cardiovascular reasons. She likely requires a higher hematocrit (27-30) to remain asymptomatic.  Plan: 1. Await results of SPEP and free light chain assay. 2. Collection of 24 hour urine for UPEP should be complete today at 1pm. 3. Await clearance by cardiology for outpatient EGD and colonoscopy. 4. Anticipate discharge soon.  Patient will follow-up with Dr. Grayland Ormond as previously scheduled.  Lequita Asal, MD  06/26/2015, 1:31 PM

## 2015-06-26 NOTE — Consult Note (Signed)
Patient denies chest pain.  I recommend she see cardiology and get evaluation and clearance before I do any GI evaluation.  She has diarrhea and off and on bleeding and has not had GI work up in 10-11 years.  Stool is heme neg at this time.  Please have her see me after her cardiac work up.

## 2015-06-26 NOTE — Progress Notes (Signed)
[place] [facility] [place] NAME: [name]    MR#:  [id number]  DATE OF BIRTH:  [birth year]  SUBJECTIVE:  CHIEF COMPLAINT:   Chief Complaint  Patient presents with  . Chest Pain   Presented with chest pains, however, has some discomfort and upper abdomen. Last EGD approximately 10 years ago which was unremarkable. Need for gastroenterologic workup. However, cardiology recommends outpatient workup including functional study. Dr. Mike Gip recommended 24-hour urine collection for  proteinuria for Multiple myeloma. Feels good today. Denies any significant chest pain, shortness of breath, abdominal pains or nausea, is ready to go home. Patient is going to finish her 24-hour urine collection at around 1-2 PM  REVIEW OF SYSTEMS:   Review of Systems  Constitutional: Positive for malaise/fatigue. Negative for fever.  Respiratory: Negative for shortness of breath.   Cardiovascular: Positive for chest pain. Negative for palpitations, claudication, leg swelling and PND.  Gastrointestinal: Negative for nausea, vomiting and abdominal pain.  Genitourinary: Negative for dysuria.  Neurological: Positive for weakness.    DRUG ALLERGIES:   Allergies  Allergen Reactions  . Bisphosphonates   . Codeine Phosphate     REACTION: Nausea and vomiting  . Hydralazine Hcl     REACTION: swelling  . Penicillins     REACTION: Itching  . Sulfa Antibiotics Itching    VITALS:  Blood pressure 141/61, pulse 84, temperature 98.1 F (36.7 C), temperature source Oral, resp. rate 16, height '5\' 3"'  (1.6 m), weight 56.518 kg (124 lb 9.6 oz), SpO2 99 %.  PHYSICAL EXAMINATION:  GENERAL:  64 y.o.-year-old patient lying in the bed with no acute distress. Pale and fatigued EYES: Pupils equal, round, reactive to light and accommodation. No scleral icterus. Extraocular muscles intact.  HEENT: Head atraumatic, normocephalic. Oropharynx and nasopharynx clear.  NECK:   Supple, no jugular venous distention. No thyroid enlargement, no tenderness.  LUNGS: Normal breath sounds bilaterally, no wheezing, rales,rhonchi or crepitation. No use of accessory muscles of respiration.  CARDIOVASCULAR: S1, S2 normal. No murmurs, rubs, or gallops.  ABDOMEN: Soft, mild discomfort in the upper abdomen with some voluntary guarding,  Mildly distended. Bowel sounds present. No organomegaly or mass.  EXTREMITIES: No pedal edema, cyanosis, or clubbing.  NEUROLOGIC: Cranial nerves II through XII are intact. Muscle strength 5/5 in all extremities. Sensation intact. Gait not checked.  PSYCHIATRIC: The patient is alert and oriented x 3.  SKIN: No obvious rash, lesion, or ulcer.    LABORATORY PANEL:   CBC  Recent Labs Lab 06/25/15 0502  WBC 3.9  HGB 10.0*  HCT 29.1*  PLT 141*   ------------------------------------------------------------------------------------------------------------------  Chemistries   Recent Labs Lab 06/25/15 0502  NA 142  K 4.6  CL 114*  CO2 25  GLUCOSE 237*  BUN 37*  CREATININE 2.05*  CALCIUM 8.6*   ------------------------------------------------------------------------------------------------------------------  Cardiac Enzymes  Recent Labs Lab 06/24/15 0431  TROPONINI <0.03   ------------------------------------------------------------------------------------------------------------------  RADIOLOGY:  No results found.  EKG:   Orders placed or performed during the hospital encounter of 06/23/15  . ED EKG (<53mns upon arrival to the ED)  . ED EKG (<147ms upon arrival to the ED)  . EKG 12-Lead  . EKG 12-Lead    ASSESSMENT AND PLAN:   Principal Problem:   Chest pain with high risk for cardiac etiology Active Problems:   Anemia  1) acute on chronic anemia - hematology consultation is appreciated. Further workup for multiple myeloma is to be finished in the next 1-2  hours, which includs 24-hour urine collection,  follow-up with patient's primary oncologist is Dr. 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 Appreciate gastroenterologist input for further recommendations as well, possible outpatient EGD when cardiology workup is finished. Continue proton pump inhibitor, Protonix.   2) chest pain - Resolving  - negative cardiac enzymes, no EKG change, no tele events - Continue PPI as she was not on one at home - cardiology consult appreciated,  recommended outpatient workup  3) DM - SSI  4) depression and anxiety - continue home medications  5) CKD 4 - stable for past 6 months 6 MGUS, patient is finishing 24-hour urine collection for proteinuria evaluation, follow-up with Dr. Grayland Ormond as outpatient  All the records are reviewed and case discussed with Care Management/Social Workerr. Management plans discussed with the patient, family and they are in agreement.  CODE STATUS: full  TOTAL TIME TAKING CARE OF THIS PATIENT: 66 minutes.   POSSIBLE D/C IN 1-2 DAYS, DEPENDING ON CLINICAL CONDITION.   Theodoro Grist M.D on 06/26/2015 at 10:49 AM  Between 7am to 6pm - Pager - [phone]  After 6pm go to [email or web address] - password [email or web address] [facility]  [facility]  Office  [phone]  CC: Primary care physician; [name], MD

## 2015-06-26 NOTE — Discharge Summary (Signed)
[facility] [place] NAME: [name]    MR#:  [id number]  DATE OF BIRTH:  06/04/1951  DATE OF ADMISSION:  06/23/2015 ADMITTING PHYSICIAN: Bettey Costa, MD  DATE OF DISCHARGE: No discharge date for patient encounter.  PRIMARY CARE PHYSICIAN: [name], MD     ADMISSION DIAGNOSIS:  Symptomatic anemia [D64.9] Chest pain, unspecified chest pain type [R07.9]  DISCHARGE DIAGNOSIS:  Principal Problem:   Chest pain with high risk for cardiac etiology Active Problems:   Anemia   Chronic kidney disease (CKD), stage IV (severe)   SECONDARY DIAGNOSIS:   Past Medical History  Diagnosis Date  . DIABETES MELLITUS, II, COMPLICATIONS 02/11/9416  . HYPERLIPIDEMIA 02/27/2007  . 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 ANEMIA, OTHER, UNSPECIFIED 02/27/2007  . DEPRESSIVE DISORDER, NOS 02/27/2007  . HYPERTENSION, BENIGN SYSTEMIC 02/27/2007  . CORONARY, ARTERIOSCLEROSIS 02/27/2007  . TIA 05/17/2009  . GASTROESOPHAGEAL REFLUX, NO ESOPHAGITIS 02/27/2007  . RENAL INSUFFICIENCY, CHRONIC 12/28/2009  . Renal failure, acute on chronic 01/25/2014  . Blood transfusion without reported diagnosis   . Anemia     .pro HOSPITAL COURSE:  Patient is 64 year old female with known history of diabetes mellitus, anemia, cardiovascular disease who presents to the hospital with complaints of shortness of breath as well as chest pain. In emergency room, she was noted to be anemic with hemoglobin level of 7.7. She was admitted to the hospital for further evaluation and cardiology consultation was obtained because of chest pains. Cardiologist, Dr. Saralyn Pilar saw the patient in consultation and felt that patient's chest pain was very likely due to anemia. Patient was transfused packed red blood cells and her hemoglobin level improved. Dr. Saralyn Pilar recommended outpatient cardiac workup, as patient's cardiac enzymes were normal and her chest pain resolved. Because of anemia,  consultations from oncologist as well as gastroenterologists were obtained. Dr. Vira Agar saw patient in consultation and felt that patient needs to get clearance from cardiology  to do any GI workup, and felt that patient would benefit from colonoscopy as well as possibly upper endoscopy. Meanwhile, he recommended to continue proton pump inhibitors. Dr. Mike Gip oncologist, saw the  patient in consultation, she recommended to get labs, such as SPEP as well as kapper lambda free light chains and collect 24 hour urine for UPEP and follow-up with Dr. Grayland Ormond as outpatient. Patient felt to satisfactory today on June 26 1015 and was ready to be discharged home. Discussion by problem 1) acute on chronic anemia - hematology consultation is appreciated. 24-hour urine collection is to be completed today, follow-up with patient's primary oncologist is Dr. 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 Appreciate gastroenterologist input for further recommendations as well, possible outpatient EGD/colonoscopy when cardiology workup is finished. Continue proton pump inhibitor, Protonix.   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 Grayland Ormond as outpatient   DISCHARGE CONDITIONS:   Fair  CONSULTS OBTAINED:  Treatment Team:  Isaias Cowman, MD  Lequita Asal, MD Manya Silvas, MD  DRUG ALLERGIES:   Allergies  Allergen Reactions  . Bisphosphonates   . Codeine  Phosphate     REACTION: Nausea and vomiting  . Hydralazine Hcl     REACTION: swelling  . Penicillins     REACTION: Itching  . Sulfa Antibiotics Itching    DISCHARGE MEDICATIONS:   Current Discharge Medication List    START taking these medications   Details  pantoprazole (PROTONIX) 40 MG tablet Take 1 tablet (40 mg total) by mouth daily. Qty: 30 tablet, Refills: 6      CONTINUE these medications which have CHANGED   Details  insulin glargine (LANTUS) 100 UNIT/ML injection Inject 0.13 mLs (13 Units total) into the skin daily. Qty: 10 mL, Refills: 11      CONTINUE these medications which have NOT CHANGED   Details  aspirin 81 MG tablet Take 81 mg by mouth daily.      Cholecalciferol (VITAMIN D-3 PO) Take 5,000 Int'l Units by mouth daily.      clopidogrel (PLAVIX) 75 MG tablet Take 75 mg by mouth daily.      dicyclomine (BENTYL) 10 MG capsule Take 10 mg by mouth 2 (two) times daily at 10 AM and 5 PM.    FLUoxetine (PROZAC) 20 MG capsule Take 20 mg by mouth daily.    gabapentin (NEURONTIN) 300 MG capsule Take 600 mg by mouth 3 (three) times daily.     insulin aspart (NOVOLOG) 100 UNIT/ML injection Inject 4-10 Units into the skin 3 (three) times daily with meals. Sliding scale    levofloxacin (LEVAQUIN) 750 MG tablet Take 1 tablet (750 mg total) by mouth daily. Qty: 4 tablet, Refills: 0    lisinopril (PRINIVIL,ZESTRIL) 2.5 MG tablet Take 2.5 mg by mouth daily.    simvastatin (ZOCOR) 20 MG tablet Take 20 mg by mouth at bedtime.     topiramate (TOPAMAX) 100 MG tablet Take 100 mg by mouth 2 (two) times daily.     ALPRAZolam (XANAX) 1 MG tablet 1 tab by mouth 3 times a day as needed for anxiety Qty: 90 tablet, Refills: 0    ketoconazole (NIZORAL) 2 % cream Apply 1 application topically daily. Qty: 60 g, Refills: 2    Naftifine HCl (NAFTIN) 2 % CREA Apply 1 application topically 1 day or 1 dose. Qty: 60 g, Refills: 2    nitroGLYCERIN (NITROSTAT) 0.4 MG SL tablet Place  0.4 mg under the tongue every 5 (five) minutes as needed.      promethazine (PHENERGAN) 25 MG tablet Take 25 mg by mouth. by mouth every six hours as needed for nausea         DISCHARGE INSTRUCTIONS:    Follow-up with Dr. Grayland Ormond, Dr. Saralyn Pilar  And primary care physician, Dr. Kary Kos in the next 1-2 days after discharge  If you experience worsening of your admission symptoms, develop shortness of breath, life threatening emergency, suicidal or homicidal thoughts you must seek medical attention immediately by calling 911 or calling your MD immediately  if symptoms less severe.  You Must read complete instructions/literature along with all the possible adverse reactions/side effects for all the Medicines you take and that have been prescribed to you. Take any new Medicines after you have completely understood and accept all the possible adverse reactions/side effects.   Please note  You were cared for by a hospitalist during your hospital stay. If you have any questions about your discharge medications or the care you  received while you were in the hospital after you are discharged, you can call the unit and asked to speak with the hospitalist on call if the hospitalist that took care of you is not available. Once you are discharged, your primary care physician will handle any further medical issues. Please note that NO REFILLS for any discharge medications will be authorized once you are discharged, as it is imperative that you return to your primary care physician (or establish a relationship with a primary care physician if you do not have one) for your aftercare needs so that they can reassess your need for medications and monitor your lab values.    Today   CHIEF COMPLAINT:   Chief Complaint  Patient presents with  . Chest Pain    HISTORY OF PRESENT ILLNESS:  [name]  is a 64 y.o. female with a known history of  diabetes mellitus, anemia, cardiovascular disease who  presents to the hospital with complaints of shortness of breath as well as chest pain. In emergency room, she was noted to be anemic with hemoglobin level of 7.7. She was admitted to the hospital for further evaluation and cardiology consultation was obtained because of chest pains. Cardiologist, Dr. Saralyn Pilar saw the patient in consultation and felt that patient's chest pain was very likely due to anemia. Patient was transfused packed red blood cells and her hemoglobin level improved. Dr. Saralyn Pilar recommended outpatient cardiac workup, as patient's cardiac enzymes were normal and her chest pain resolved. Because of anemia, consultations from oncologist as well as gastroenterologists were obtained. Dr. Vira Agar saw patient in consultation and felt that patient needs to get clearance from cardiology  to do any GI workup, and felt that patient would benefit from colonoscopy as well as possibly upper endoscopy. Meanwhile, he recommended to continue proton pump inhibitors. Dr. Mike Gip oncologist, saw the  patient in consultation, she recommended to get labs, such as SPEP as well as kapper lambda free light chains and collect 24 hour urine for UPEP and follow-up with Dr. Grayland Ormond as outpatient. Patient felt to satisfactory today on June 26 1015 and was ready to be discharged home. Discussion by problem 1) acute on chronic anemia - hematology consultation is appreciated. 24-hour urine collection is to be completed today, follow-up with patient's primary oncologist is Dr. 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 Appreciate gastroenterologist input for further recommendations as well, possible outpatient EGD/colonoscopy when cardiology workup is finished. Continue proton pump inhibitor, Protonix.   2) chest pain - Resolved  - negative cardiac  enzymes, no EKG change, no tele events - Continue PPI as she was not on one at home - cardiology consult appreciated, recommended outpatient workup  3) DM - SSI  4) depression and anxiety - continue home medications  5) CKD 4 - stable for past 6 months  6) MGUS, patient is completing 24-hour urine collection for proteinuria evaluation, follow-up with Dr. Grayland Ormond as outpatient     VITAL SIGNS:  Blood pressure 141/61, pulse 84, temperature 98.1 F (36.7 C), temperature source Oral, resp. rate 16, height 5\' 3"  (1.6 m), weight 56.518 kg (124 lb 9.6 oz), SpO2 99 %.  I/O:   Intake/Output Summary (Last 24 hours) at 06/26/15 1127 Last data filed at 06/26/15 1055  Gross per 24 hour  Intake    360 ml  Output   1650 ml  Net  -1290 ml    PHYSICAL EXAMINATION:  GENERAL:  64 y.o.-year-old patient lying in the bed with no acute distress.  EYES: Pupils equal, round, reactive to light and accommodation. No scleral icterus. Extraocular muscles intact.  HEENT: Head atraumatic, normocephalic. Oropharynx and nasopharynx clear.  NECK:  Supple, no jugular venous distention. No thyroid enlargement, no tenderness.  LUNGS: Normal breath sounds bilaterally, no wheezing, rales,rhonchi or crepitation. No use of accessory muscles of respiration.  CARDIOVASCULAR: S1, S2 normal. No murmurs, rubs, or gallops.  ABDOMEN: Soft, non-tender, non-distended. Bowel sounds present. No organomegaly or mass.  EXTREMITIES: No pedal edema, cyanosis, or clubbing.  NEUROLOGIC: Cranial nerves II through XII are intact. Muscle strength 5/5 in all extremities. Sensation intact. Gait not checked.  PSYCHIATRIC: The patient is alert and oriented x 3.  SKIN: No obvious rash, lesion, or ulcer.   DATA REVIEW:   CBC  Recent Labs Lab 06/25/15 0502  WBC 3.9  HGB 10.0*  HCT 29.1*  PLT 141*    Chemistries   Recent Labs Lab 06/25/15 0502  NA 142  K 4.6  CL 114*  CO2 25  GLUCOSE 237*  BUN 37*  CREATININE  2.05*  CALCIUM 8.6*    Cardiac Enzymes  Recent Labs Lab 06/24/15 0431  TROPONINI <0.03    Microbiology Results  Results for orders placed or performed during the hospital encounter of 06/20/15  Urine culture     Status: None   Collection Time: 06/20/15  6:47 PM  Result Value Ref Range Status   Specimen Description URINE, CLEAN CATCH  Final   Special Requests NONE  Final   Culture >=100,000 COLONIES/mL KLEBSIELLA PNEUMONIAE  Final   Report Status 06/23/2015 FINAL  Final   Organism ID, Bacteria KLEBSIELLA PNEUMONIAE  Final      Susceptibility   Klebsiella pneumoniae - MIC*    AMPICILLIN 16 RESISTANT Resistant     CEFAZOLIN <=4 SENSITIVE Sensitive     CEFTRIAXONE <=1 SENSITIVE Sensitive     CIPROFLOXACIN <=0.25 SENSITIVE Sensitive     GENTAMICIN <=1 SENSITIVE Sensitive     IMIPENEM <=0.25 SENSITIVE Sensitive     NITROFURANTOIN <=16 SENSITIVE Sensitive     TRIMETH/SULFA <=20 SENSITIVE Sensitive     CEFOXITIN <=4 SENSITIVE Sensitive     * >=100,000 COLONIES/mL KLEBSIELLA PNEUMONIAE    RADIOLOGY:  No results found.  EKG:   Orders placed or performed during the hospital encounter of 06/23/15  . ED EKG (<43mins upon arrival to the ED)  . ED EKG (<72mins upon arrival to the ED)  . EKG 12-Lead  . EKG 12-Lead      Management plans discussed with the patient, family and they are in agreement.  CODE STATUS:     Code Status Orders        Start     Ordered   06/23/15 2051  Full code   Continuous     06/23/15 2050      TOTAL TIME TAKING CARE OF THIS PATIENT: 40 minutes.    Theodoro Grist M.D on 06/26/2015 at 11:27 AM  Between 7am to 6pm - Pager - [phone]  After 6pm go to [email or web address] - password [email or web address] [facility]  [facility]  Office  [phone]  CC: Primary care physician; [name], MD

## 2015-06-27 LAB — PROTEIN ELECTROPHORESIS, SERUM
A/G Ratio: 1.6 (ref 0.7–1.7)
Albumin ELP: 3.1 g/dL (ref 2.9–4.4)
Alpha-1-Globulin: 0.2 g/dL (ref 0.0–0.4)
Alpha-2-Globulin: 0.3 g/dL — ABNORMAL LOW (ref 0.4–1.0)
Beta Globulin: 0.5 g/dL — ABNORMAL LOW (ref 0.7–1.3)
Gamma Globulin: 0.9 g/dL (ref 0.4–1.8)
Globulin, Total: 1.9 g/dL — ABNORMAL LOW (ref 2.2–3.9)
M-Spike, %: 0.5 g/dL — ABNORMAL HIGH
Total Protein ELP: 5 g/dL — ABNORMAL LOW (ref 6.0–8.5)

## 2015-06-28 LAB — KAPPA/LAMBDA LIGHT CHAINS
Kappa free light chain: 120.9 mg/L — ABNORMAL HIGH (ref 3.30–19.40)
Kappa, lambda light chain ratio: 7.25 — ABNORMAL HIGH (ref 0.26–1.65)
Lambda free light chains: 16.67 mg/L (ref 5.71–26.30)

## 2015-06-28 LAB — UPEP/TP, 24-HR URINE
Albumin, U: 17.8 %
Alpha 1, Urine: 3.8 %
Alpha 2, Urine: 9.7 %
Beta, Urine: 28.2 %
Gamma Globulin, Urine: 40.6 %
M-Spike, mg/24 hr: 30.8 mg/24 hr — ABNORMAL HIGH
M-spike, %: 32.6 % — ABNORMAL HIGH
Total Protein, Urine-Ur/day: 94.5 mg/24 hr (ref 30.0–150.0)
Total Protein, Urine: 6.3 mg/dL
Total Volume: 1500

## 2015-06-30 LAB — IGG, IGA, IGM
IgA: 61 mg/dL — ABNORMAL LOW (ref 87–352)
IgG (Immunoglobin G), Serum: 966 mg/dL (ref 700–1600)
IgM, Serum: 52 mg/dL (ref 26–217)

## 2015-07-21 ENCOUNTER — Inpatient Hospital Stay: Payer: Commercial Managed Care - HMO

## 2015-07-21 ENCOUNTER — Inpatient Hospital Stay: Payer: Commercial Managed Care - HMO | Attending: Oncology | Admitting: Oncology

## 2015-07-21 VITALS — BP 113/67 | HR 77 | Temp 97.4°F | Resp 16 | Wt 110.0 lb

## 2015-07-21 DIAGNOSIS — Z8673 Personal history of transient ischemic attack (TIA), and cerebral infarction without residual deficits: Secondary | ICD-10-CM

## 2015-07-21 DIAGNOSIS — D472 Monoclonal gammopathy: Secondary | ICD-10-CM | POA: Diagnosis not present

## 2015-07-21 DIAGNOSIS — N189 Chronic kidney disease, unspecified: Principal | ICD-10-CM

## 2015-07-21 DIAGNOSIS — N179 Acute kidney failure, unspecified: Secondary | ICD-10-CM

## 2015-07-21 DIAGNOSIS — E785 Hyperlipidemia, unspecified: Secondary | ICD-10-CM | POA: Diagnosis not present

## 2015-07-21 DIAGNOSIS — I10 Essential (primary) hypertension: Secondary | ICD-10-CM | POA: Insufficient documentation

## 2015-07-21 DIAGNOSIS — E119 Type 2 diabetes mellitus without complications: Secondary | ICD-10-CM | POA: Insufficient documentation

## 2015-07-21 DIAGNOSIS — Z794 Long term (current) use of insulin: Secondary | ICD-10-CM | POA: Diagnosis not present

## 2015-07-21 DIAGNOSIS — D649 Anemia, unspecified: Secondary | ICD-10-CM

## 2015-07-21 DIAGNOSIS — Z7982 Long term (current) use of aspirin: Secondary | ICD-10-CM | POA: Diagnosis not present

## 2015-07-21 DIAGNOSIS — N289 Disorder of kidney and ureter, unspecified: Secondary | ICD-10-CM | POA: Insufficient documentation

## 2015-07-21 DIAGNOSIS — F329 Major depressive disorder, single episode, unspecified: Secondary | ICD-10-CM | POA: Insufficient documentation

## 2015-07-21 DIAGNOSIS — R252 Cramp and spasm: Secondary | ICD-10-CM

## 2015-07-21 DIAGNOSIS — I2510 Atherosclerotic heart disease of native coronary artery without angina pectoris: Secondary | ICD-10-CM | POA: Diagnosis not present

## 2015-07-21 DIAGNOSIS — R0602 Shortness of breath: Secondary | ICD-10-CM | POA: Insufficient documentation

## 2015-07-21 DIAGNOSIS — Z79899 Other long term (current) drug therapy: Secondary | ICD-10-CM | POA: Diagnosis not present

## 2015-07-21 DIAGNOSIS — R079 Chest pain, unspecified: Secondary | ICD-10-CM | POA: Diagnosis not present

## 2015-07-21 DIAGNOSIS — K219 Gastro-esophageal reflux disease without esophagitis: Secondary | ICD-10-CM | POA: Insufficient documentation

## 2015-07-21 DIAGNOSIS — D509 Iron deficiency anemia, unspecified: Secondary | ICD-10-CM | POA: Insufficient documentation

## 2015-07-21 DIAGNOSIS — D631 Anemia in chronic kidney disease: Secondary | ICD-10-CM

## 2015-07-21 LAB — HEMOGLOBIN: Hemoglobin: 9.5 g/dL — ABNORMAL LOW (ref 12.0–16.0)

## 2015-07-22 ENCOUNTER — Telehealth: Payer: Self-pay | Admitting: *Deleted

## 2015-07-22 ENCOUNTER — Inpatient Hospital Stay: Payer: Commercial Managed Care - HMO

## 2015-07-22 NOTE — Telephone Encounter (Signed)
Appointment was for Procrit injection today.  OK to reschedule but can't be more than 7 days from last lab results.

## 2015-07-22 NOTE — Telephone Encounter (Signed)
Patient will not be coming today for her injection.  Can this be rescheduled for the first of next week?

## 2015-07-26 ENCOUNTER — Inpatient Hospital Stay: Payer: Commercial Managed Care - HMO

## 2015-07-26 VITALS — BP 146/75 | HR 73 | Temp 97.2°F | Resp 16

## 2015-07-26 DIAGNOSIS — D509 Iron deficiency anemia, unspecified: Secondary | ICD-10-CM | POA: Diagnosis not present

## 2015-07-26 DIAGNOSIS — D649 Anemia, unspecified: Secondary | ICD-10-CM

## 2015-07-26 DIAGNOSIS — N189 Chronic kidney disease, unspecified: Principal | ICD-10-CM

## 2015-07-26 DIAGNOSIS — N179 Acute kidney failure, unspecified: Secondary | ICD-10-CM

## 2015-07-26 MED ORDER — EPOETIN ALFA 40000 UNIT/ML IJ SOLN
40000.0000 [IU] | Freq: Once | INTRAMUSCULAR | Status: AC
  Administered 2015-07-26: 40000 [IU] via SUBCUTANEOUS
  Filled 2015-07-26: qty 1

## 2015-07-31 NOTE — Progress Notes (Signed)
[name]  [phone] [phone]  ID: [name] OB: 12/21/1951  MR#: [id number]  CHE#:[id number]  Patient Care Team: [name], MD as PCP - General (Family Medicine)  CHIEF COMPLAINT:  Chief Complaint  Patient presents with  . Follow-up    Pt c/o severe cramping in her lower legs and SOB...    INTERVAL HISTORY: Patient returns to clinic today for further evaluation and repeat laboratory work. She continues to have chronic weakness and fatigue. She also has increasing shortness of breath. Finally, she has occasional "severe" cramping in her lower legs. She also has multiple medical problems and complaints which are chronic and unchanged as well.  She denies any recent fevers. She has a good appetite and denies weight loss. She denies any nausea, vomiting, constipation, or diarrhea. She denies any melena or hematochezia.  She has no urinary complaints. Patient offers no further specific complaints today.   REVIEW OF SYSTEMS:   Review of Systems  Constitutional: Positive for malaise/fatigue. Negative for fever.  Respiratory: Positive for shortness of breath.   Cardiovascular: Positive for chest pain and leg swelling.  Musculoskeletal: Positive for joint pain.  Neurological: Positive for weakness.    As per HPI. Otherwise, a complete review of systems is negatve.  PAST MEDICAL HISTORY: Past Medical History  Diagnosis Date  . DIABETES MELLITUS, II, COMPLICATIONS 5/36/1443  . HYPERLIPIDEMIA 02/27/2007  . 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 ANEMIA, OTHER, UNSPECIFIED 02/27/2007  . DEPRESSIVE DISORDER, NOS 02/27/2007  . HYPERTENSION, BENIGN SYSTEMIC 02/27/2007  . CORONARY, ARTERIOSCLEROSIS 02/27/2007  . TIA 05/17/2009  . GASTROESOPHAGEAL REFLUX, NO ESOPHAGITIS 02/27/2007  . RENAL INSUFFICIENCY, CHRONIC 12/28/2009  . Renal failure, acute on chronic 01/25/2014  . Blood transfusion without reported diagnosis   . Anemia     PAST  SURGICAL HISTORY: Past Surgical History  Procedure Laterality Date  . Partial hysterectomy    . Rectocele repair    . Bladder surgery      Tack    FAMILY HISTORY Family History  Problem Relation Age of Onset  . Cancer Mother     unknown  . Cancer Sister     unknown cancer ?breast ca  . CAD Mother        ADVANCED DIRECTIVES:    HEALTH MAINTENANCE: History  Substance Use Topics  . Smoking status: Never Smoker   . Smokeless tobacco: Never Used  . Alcohol Use: No     Colonoscopy:  PAP:  Bone density:  Lipid panel:  Allergies  Allergen Reactions  . Bisphosphonates   . Codeine Phosphate     REACTION: Nausea and vomiting  . Hydralazine Hcl     REACTION: swelling  . Penicillins     REACTION: Itching  . Sulfa Antibiotics Itching    Current Outpatient Prescriptions  Medication Sig Dispense Refill  . ALPRAZolam (XANAX) 1 MG tablet 1 tab by mouth 3 times a day as needed for anxiety 90 tablet 0  . aspirin 81 MG tablet Take 81 mg by mouth daily.      . Cholecalciferol (VITAMIN D-3 PO) Take 5,000 Int'l Units by mouth daily.      . clopidogrel (PLAVIX) 75 MG tablet Take 75 mg by mouth daily.      Marland Kitchen dicyclomine (BENTYL) 10 MG capsule Take 10 mg by mouth 2 (two) times daily at 10 AM and 5 PM.    . FLUoxetine (PROZAC) 20 MG capsule Take 20 mg by mouth daily.    Marland Kitchen gabapentin (NEURONTIN)  300 MG capsule Take 600 mg by mouth 3 (three) times daily.     . insulin aspart (NOVOLOG) 100 UNIT/ML injection Inject 4-10 Units into the skin 3 (three) times daily with meals. Sliding scale    . insulin glargine (LANTUS) 100 UNIT/ML injection Inject 0.13 mLs (13 Units total) into the skin daily. 10 mL 11  . ketoconazole (NIZORAL) 2 % cream Apply 1 application topically daily. 60 g 2  . lisinopril (PRINIVIL,ZESTRIL) 2.5 MG tablet Take 2.5 mg by mouth daily.    . Naftifine HCl (NAFTIN) 2 % CREA Apply 1 application topically 1 day or 1 dose. 60 g 2  . nitroGLYCERIN (NITROSTAT) 0.4 MG SL tablet  Place 0.4 mg under the tongue every 5 (five) minutes as needed.      . pantoprazole (PROTONIX) 40 MG tablet Take 1 tablet (40 mg total) by mouth daily. 30 tablet 6  . promethazine (PHENERGAN) 25 MG tablet Take 25 mg by mouth. by mouth every six hours as needed for nausea    . simvastatin (ZOCOR) 20 MG tablet Take 20 mg by mouth at bedtime.     . topiramate (TOPAMAX) 100 MG tablet Take 100 mg by mouth 2 (two) times daily.     Marland Kitchen levofloxacin (LEVAQUIN) 750 MG tablet Take 1 tablet (750 mg total) by mouth daily. (Patient not taking: Reported on 07/21/2015) 4 tablet 0   No current facility-administered medications for this visit.    OBJECTIVE: Filed Vitals:   07/21/15 1504  BP: 113/67  Pulse: 77  Temp: 97.4 F (36.3 C)  Resp: 16     Body mass index is 19.49 kg/(m^2).    ECOG FS:2 - Symptomatic, <50% confined to bed  General: Well-developed, well-nourished, no acute distress. Eyes: Pink conjunctiva, anicteric sclera. Lungs: Clear to auscultation bilaterally. Heart: Regular rate and rhythm. No rubs, murmurs, or gallops. Abdomen: Soft, nontender, nondistended. No organomegaly noted, normoactive bowel sounds. Musculoskeletal: No edema, cyanosis, or clubbing. Neuro: Alert, answering all questions appropriately. Cranial nerves grossly intact. Skin: No rashes or petechiae noted. Psych: Normal affect.   LAB RESULTS:  Lab Results  Component Value Date   NA 142 06/25/2015   K 4.6 06/25/2015   CL 114* 06/25/2015   CO2 25 06/25/2015   GLUCOSE 237* 06/25/2015   BUN 37* 06/25/2015   CREATININE 2.05* 06/25/2015   CALCIUM 8.6* 06/25/2015   PROT 6.3* 05/04/2014   ALBUMIN 3.2* 05/04/2014   AST 15 05/04/2014   ALT 17 05/04/2014   ALKPHOS 44* 05/04/2014   BILITOT 0.4 05/04/2014   GFRNONAA 24* 06/25/2015   GFRAA 28* 06/25/2015    Lab Results  Component Value Date   WBC 3.9 06/25/2015   NEUTROABS 2.7 02/03/2015   HGB 9.5* 07/21/2015   HCT 29.1* 06/25/2015   MCV 97.4 06/25/2015   PLT  141* 06/25/2015     STUDIES: No results found.  ASSESSMENT: Anemia secondary to chronic renal insufficiency, MGUS.  PLAN:    1. Anemia: Patient's hemoglobin has improved with Procrit, although it is still less than 10.0. She will benefit from 40,000 units subcutaneous Procrit today. Return to clinic every 4 weeks for consideration of Procrit and then in 4 months for further evaluation. Iron stores, B12, and folate are all within normal limits. Continue follow-up with GI as indicated. 2. MGUS: Patient has an M spike of 0.4. Continue to monitor every 6 months. 3. Chest pain/shortness breath: Patient states her symptoms are improving. Continue follow-up with cardiology. 4. Leg cramping: Likely multifactorial.  Monitor. 5. Renal insufficiency: Patient's creatinine appears to be approximately her baseline.  Patient expressed understanding and was in agreement with this plan. She also understands that She can call clinic at any time with any questions, concerns, or complaints.    Lloyd Huger, MD   07/31/2015 1:12 PM

## 2015-08-18 ENCOUNTER — Inpatient Hospital Stay: Payer: Commercial Managed Care - HMO

## 2015-08-18 ENCOUNTER — Inpatient Hospital Stay: Payer: Commercial Managed Care - HMO | Attending: Oncology

## 2015-08-30 ENCOUNTER — Inpatient Hospital Stay: Payer: Commercial Managed Care - HMO

## 2015-09-08 ENCOUNTER — Inpatient Hospital Stay: Payer: Commercial Managed Care - HMO | Attending: Oncology

## 2015-09-08 ENCOUNTER — Inpatient Hospital Stay: Payer: Commercial Managed Care - HMO

## 2015-09-08 VITALS — BP 118/68 | HR 92 | Resp 18

## 2015-09-08 DIAGNOSIS — Z79899 Other long term (current) drug therapy: Secondary | ICD-10-CM | POA: Diagnosis not present

## 2015-09-08 DIAGNOSIS — N189 Chronic kidney disease, unspecified: Principal | ICD-10-CM

## 2015-09-08 DIAGNOSIS — D509 Iron deficiency anemia, unspecified: Secondary | ICD-10-CM | POA: Insufficient documentation

## 2015-09-08 DIAGNOSIS — D649 Anemia, unspecified: Secondary | ICD-10-CM

## 2015-09-08 DIAGNOSIS — D631 Anemia in chronic kidney disease: Secondary | ICD-10-CM | POA: Insufficient documentation

## 2015-09-08 DIAGNOSIS — N179 Acute kidney failure, unspecified: Secondary | ICD-10-CM

## 2015-09-08 LAB — HEMOGLOBIN: Hemoglobin: 7.3 g/dL — ABNORMAL LOW (ref 12.0–16.0)

## 2015-09-08 MED ORDER — EPOETIN ALFA 40000 UNIT/ML IJ SOLN
40000.0000 [IU] | Freq: Once | INTRAMUSCULAR | Status: AC
  Administered 2015-09-08: 40000 [IU] via SUBCUTANEOUS
  Filled 2015-09-08: qty 1

## 2015-09-29 ENCOUNTER — Inpatient Hospital Stay: Payer: Commercial Managed Care - HMO

## 2015-10-06 ENCOUNTER — Inpatient Hospital Stay: Payer: Commercial Managed Care - HMO

## 2015-10-06 ENCOUNTER — Inpatient Hospital Stay: Payer: Commercial Managed Care - HMO | Attending: Oncology

## 2015-11-21 ENCOUNTER — Inpatient Hospital Stay: Payer: Commercial Managed Care - HMO | Admitting: Oncology

## 2015-11-21 ENCOUNTER — Ambulatory Visit: Payer: Commercial Managed Care - HMO

## 2015-11-21 ENCOUNTER — Other Ambulatory Visit: Payer: Commercial Managed Care - HMO

## 2015-12-08 ENCOUNTER — Inpatient Hospital Stay: Payer: Commercial Managed Care - HMO | Admitting: Oncology

## 2015-12-08 ENCOUNTER — Inpatient Hospital Stay: Payer: Commercial Managed Care - HMO

## 2015-12-14 ENCOUNTER — Inpatient Hospital Stay (HOSPITAL_BASED_OUTPATIENT_CLINIC_OR_DEPARTMENT_OTHER): Payer: Commercial Managed Care - HMO | Admitting: Oncology

## 2015-12-14 ENCOUNTER — Inpatient Hospital Stay: Payer: Commercial Managed Care - HMO | Attending: Oncology

## 2015-12-14 ENCOUNTER — Inpatient Hospital Stay: Payer: Commercial Managed Care - HMO

## 2015-12-14 VITALS — BP 137/79 | HR 94 | Resp 18

## 2015-12-14 VITALS — BP 172/90 | HR 98 | Temp 97.5°F | Resp 16 | Wt 113.5 lb

## 2015-12-14 DIAGNOSIS — K219 Gastro-esophageal reflux disease without esophagitis: Secondary | ICD-10-CM | POA: Diagnosis not present

## 2015-12-14 DIAGNOSIS — Z808 Family history of malignant neoplasm of other organs or systems: Secondary | ICD-10-CM | POA: Diagnosis not present

## 2015-12-14 DIAGNOSIS — E119 Type 2 diabetes mellitus without complications: Secondary | ICD-10-CM | POA: Diagnosis not present

## 2015-12-14 DIAGNOSIS — R0602 Shortness of breath: Secondary | ICD-10-CM

## 2015-12-14 DIAGNOSIS — N189 Chronic kidney disease, unspecified: Secondary | ICD-10-CM

## 2015-12-14 DIAGNOSIS — Z79899 Other long term (current) drug therapy: Secondary | ICD-10-CM

## 2015-12-14 DIAGNOSIS — R079 Chest pain, unspecified: Secondary | ICD-10-CM | POA: Insufficient documentation

## 2015-12-14 DIAGNOSIS — R531 Weakness: Secondary | ICD-10-CM | POA: Diagnosis not present

## 2015-12-14 DIAGNOSIS — I2510 Atherosclerotic heart disease of native coronary artery without angina pectoris: Secondary | ICD-10-CM

## 2015-12-14 DIAGNOSIS — Z8673 Personal history of transient ischemic attack (TIA), and cerebral infarction without residual deficits: Secondary | ICD-10-CM | POA: Diagnosis not present

## 2015-12-14 DIAGNOSIS — I129 Hypertensive chronic kidney disease with stage 1 through stage 4 chronic kidney disease, or unspecified chronic kidney disease: Secondary | ICD-10-CM | POA: Diagnosis not present

## 2015-12-14 DIAGNOSIS — D649 Anemia, unspecified: Secondary | ICD-10-CM

## 2015-12-14 DIAGNOSIS — Z794 Long term (current) use of insulin: Secondary | ICD-10-CM | POA: Diagnosis not present

## 2015-12-14 DIAGNOSIS — Z7982 Long term (current) use of aspirin: Secondary | ICD-10-CM | POA: Insufficient documentation

## 2015-12-14 DIAGNOSIS — E785 Hyperlipidemia, unspecified: Secondary | ICD-10-CM | POA: Diagnosis not present

## 2015-12-14 DIAGNOSIS — D631 Anemia in chronic kidney disease: Secondary | ICD-10-CM

## 2015-12-14 DIAGNOSIS — D472 Monoclonal gammopathy: Secondary | ICD-10-CM | POA: Diagnosis not present

## 2015-12-14 DIAGNOSIS — N179 Acute kidney failure, unspecified: Secondary | ICD-10-CM

## 2015-12-14 DIAGNOSIS — R252 Cramp and spasm: Secondary | ICD-10-CM

## 2015-12-14 DIAGNOSIS — R5383 Other fatigue: Secondary | ICD-10-CM

## 2015-12-14 DIAGNOSIS — F329 Major depressive disorder, single episode, unspecified: Secondary | ICD-10-CM | POA: Diagnosis not present

## 2015-12-14 DIAGNOSIS — I10 Essential (primary) hypertension: Secondary | ICD-10-CM

## 2015-12-14 DIAGNOSIS — M2550 Pain in unspecified joint: Secondary | ICD-10-CM | POA: Diagnosis not present

## 2015-12-14 LAB — HEMOGLOBIN: Hemoglobin: 7.8 g/dL — ABNORMAL LOW (ref 12.0–16.0)

## 2015-12-14 MED ORDER — EPOETIN ALFA 40000 UNIT/ML IJ SOLN
40000.0000 [IU] | Freq: Once | INTRAMUSCULAR | Status: AC
  Administered 2015-12-14: 40000 [IU] via SUBCUTANEOUS
  Filled 2015-12-14: qty 1

## 2015-12-14 NOTE — Progress Notes (Signed)
Patient denies any problems in regards to her anemia.

## 2015-12-31 NOTE — Progress Notes (Signed)
[facility]  [phone] [phone]  ID: [name] OB: 1951/04/18  MR#: [id number]  [id number]  Patient Care Team: [name], MD as PCP - General (Family Medicine)  CHIEF COMPLAINT:  Chief Complaint  Patient presents with  . Anemia    INTERVAL HISTORY: Patient returns to clinic today for further evaluation and repeat laboratory work. She continues to have chronic weakness and fatigue. She has multiple medical problems and complaints which are chronic and unchanged as well.  She has no neurologic complaints. She denies any recent fevers. She has a good appetite and denies weight loss. She denies any nausea, vomiting, constipation, or diarrhea. She denies any melena or hematochezia.  She has no urinary complaints. Patient offers no further specific complaints today.   REVIEW OF SYSTEMS:   Review of Systems  Constitutional: Positive for malaise/fatigue. Negative for fever.  Respiratory: Positive for shortness of breath.   Cardiovascular: Positive for chest pain and leg swelling.  Musculoskeletal: Positive for joint pain.  Neurological: Positive for weakness.    As per HPI. Otherwise, a complete review of systems is negatve.  PAST MEDICAL HISTORY: Past Medical History  Diagnosis Date  . DIABETES MELLITUS, II, COMPLICATIONS 99991111  . HYPERLIPIDEMIA 02/27/2007  . 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 ANEMIA, OTHER, UNSPECIFIED 02/27/2007  . DEPRESSIVE DISORDER, NOS 02/27/2007  . HYPERTENSION, BENIGN SYSTEMIC 02/27/2007  . CORONARY, ARTERIOSCLEROSIS 02/27/2007  . TIA 05/17/2009  . GASTROESOPHAGEAL REFLUX, NO ESOPHAGITIS 02/27/2007  . RENAL INSUFFICIENCY, CHRONIC 12/28/2009  . Renal failure, acute on chronic 01/25/2014  . Blood transfusion without reported diagnosis   . Anemia     PAST SURGICAL HISTORY: Past Surgical History  Procedure Laterality Date  . Partial hysterectomy    . Rectocele repair    . Bladder surgery     Tack    FAMILY HISTORY Family History  Problem Relation Age of Onset  . Cancer Mother     unknown  . Cancer Sister     unknown cancer ?breast ca  . CAD Mother        ADVANCED DIRECTIVES:    HEALTH MAINTENANCE: Social History  Substance Use Topics  . Smoking status: Never Smoker   . Smokeless tobacco: Never Used  . Alcohol Use: No     Colonoscopy:  PAP:  Bone density:  Lipid panel:  Allergies  Allergen Reactions  . Bisphosphonates   . Codeine Phosphate     REACTION: Nausea and vomiting  . Hydralazine Hcl     REACTION: swelling  . Penicillins     REACTION: Itching  . Sulfa Antibiotics Itching    Current Outpatient Prescriptions  Medication Sig Dispense Refill  . ALPRAZolam (XANAX) 1 MG tablet 1 tab by mouth 3 times a day as needed for anxiety 90 tablet 0  . aspirin 81 MG tablet Take 81 mg by mouth daily.      . Cholecalciferol (VITAMIN D-3 PO) Take 5,000 Int'l Units by mouth daily.      . clopidogrel (PLAVIX) 75 MG tablet Take 75 mg by mouth daily.      Marland Kitchen dicyclomine (BENTYL) 10 MG capsule Take 10 mg by mouth 2 (two) times daily at 10 AM and 5 PM.    . FLUoxetine (PROZAC) 20 MG capsule Take 20 mg by mouth daily.    Marland Kitchen gabapentin (NEURONTIN) 300 MG capsule Take 600 mg by mouth 3 (three) times daily.     . insulin aspart (NOVOLOG) 100 UNIT/ML injection Inject 4-10 Units into  the skin 3 (three) times daily with meals. Sliding scale    . insulin glargine (LANTUS) 100 UNIT/ML injection Inject 0.13 mLs (13 Units total) into the skin daily. 10 mL 11  . ketoconazole (NIZORAL) 2 % cream Apply 1 application topically daily. 60 g 2  . lisinopril (PRINIVIL,ZESTRIL) 2.5 MG tablet Take 2.5 mg by mouth daily.    . Naftifine HCl (NAFTIN) 2 % CREA Apply 1 application topically 1 day or 1 dose. 60 g 2  . nitroGLYCERIN (NITROSTAT) 0.4 MG SL tablet Place 0.4 mg under the tongue every 5 (five) minutes as needed.      . pantoprazole (PROTONIX) 40 MG tablet Take 1 tablet (40 mg total)  by mouth daily. 30 tablet 6  . promethazine (PHENERGAN) 25 MG tablet Take 25 mg by mouth. by mouth every six hours as needed for nausea    . simvastatin (ZOCOR) 20 MG tablet Take 20 mg by mouth at bedtime.     . topiramate (TOPAMAX) 100 MG tablet Take 100 mg by mouth 2 (two) times daily.     Marland Kitchen levofloxacin (LEVAQUIN) 750 MG tablet Take 1 tablet (750 mg total) by mouth daily. (Patient not taking: Reported on 07/21/2015) 4 tablet 0   No current facility-administered medications for this visit.    OBJECTIVE: Filed Vitals:   12/14/15 1353  BP: 172/90  Pulse: 98  Temp: 97.5 F (36.4 C)  Resp: 16     Body mass index is 20.12 kg/(m^2).    ECOG FS:2 - Symptomatic, <50% confined to bed  General: Well-developed, well-nourished, no acute distress. Eyes: Pink conjunctiva, anicteric sclera. Lungs: Clear to auscultation bilaterally. Heart: Regular rate and rhythm. No rubs, murmurs, or gallops. Abdomen: Soft, nontender, nondistended. No organomegaly noted, normoactive bowel sounds. Musculoskeletal: No edema, cyanosis, or clubbing. Neuro: Alert, answering all questions appropriately. Cranial nerves grossly intact. Skin: No rashes or petechiae noted. Psych: Normal affect.   LAB RESULTS:  Lab Results  Component Value Date   NA 142 06/25/2015   K 4.6 06/25/2015   CL 114* 06/25/2015   CO2 25 06/25/2015   GLUCOSE 237* 06/25/2015   BUN 37* 06/25/2015   CREATININE 2.05* 06/25/2015   CALCIUM 8.6* 06/25/2015   PROT 6.3* 05/04/2014   ALBUMIN 3.2* 05/04/2014   AST 15 05/04/2014   ALT 17 05/04/2014   ALKPHOS 44* 05/04/2014   BILITOT 0.4 05/04/2014   GFRNONAA 24* 06/25/2015   GFRAA 28* 06/25/2015    Lab Results  Component Value Date   WBC 3.9 06/25/2015   NEUTROABS 2.7 02/03/2015   HGB 7.8* 12/14/2015   HCT 29.1* 06/25/2015   MCV 97.4 06/25/2015   PLT 141* 06/25/2015     STUDIES: No results found.  ASSESSMENT: Anemia secondary to chronic renal insufficiency, MGUS.  PLAN:    1.  Anemia: Patient's hemoglobin has improved with Procrit, although it is still less than 10.0. She will benefit from 40,000 units subcutaneous Procrit today. Return to clinic every 4 weeks for consideration of Procrit and then in 4 months for further evaluation. Iron stores, B12, and folate are all within normal limits, repeat iron stores at next visit. Continue follow-up with GI as indicated. 2. MGUS: Patient has an M spike of 0.4. Continue to monitor every 6 months. 3. Chest pain/shortness breath: Patient states her symptoms are improving. Continue follow-up with cardiology. 4. Leg cramping: Likely multifactorial. Monitor. 5. Renal insufficiency: Patient's creatinine appears to be approximately her baseline.  Patient expressed understanding and was in agreement with  this plan. She also understands that She can call clinic at any time with any questions, concerns, or complaints.    Lloyd Huger, MD   12/31/2015 11:22 AM

## 2016-01-11 ENCOUNTER — Inpatient Hospital Stay: Payer: Commercial Managed Care - HMO

## 2016-02-07 ENCOUNTER — Encounter: Payer: Self-pay | Admitting: Emergency Medicine

## 2016-02-07 ENCOUNTER — Inpatient Hospital Stay
Admission: EM | Admit: 2016-02-07 | Discharge: 2016-02-11 | DRG: 281 | Disposition: A | Discharge status: Home / Self Care | Payer: Commercial Managed Care - HMO | Attending: Internal Medicine | Admitting: Internal Medicine

## 2016-02-07 ENCOUNTER — Emergency Department: Payer: Commercial Managed Care - HMO

## 2016-02-07 DIAGNOSIS — G8929 Other chronic pain: Secondary | ICD-10-CM | POA: Diagnosis not present

## 2016-02-07 DIAGNOSIS — I409 Acute myocarditis, unspecified: Secondary | ICD-10-CM

## 2016-02-07 DIAGNOSIS — R1310 Dysphagia, unspecified: Secondary | ICD-10-CM | POA: Diagnosis not present

## 2016-02-07 DIAGNOSIS — I214 Non-ST elevation (NSTEMI) myocardial infarction: Secondary | ICD-10-CM | POA: Diagnosis present

## 2016-02-07 DIAGNOSIS — I129 Hypertensive chronic kidney disease with stage 1 through stage 4 chronic kidney disease, or unspecified chronic kidney disease: Secondary | ICD-10-CM | POA: Diagnosis present

## 2016-02-07 DIAGNOSIS — K219 Gastro-esophageal reflux disease without esophagitis: Secondary | ICD-10-CM | POA: Diagnosis present

## 2016-02-07 DIAGNOSIS — N184 Chronic kidney disease, stage 4 (severe): Secondary | ICD-10-CM | POA: Diagnosis present

## 2016-02-07 DIAGNOSIS — Z880 Allergy status to penicillin: Secondary | ICD-10-CM | POA: Diagnosis not present

## 2016-02-07 DIAGNOSIS — I2510 Atherosclerotic heart disease of native coronary artery without angina pectoris: Secondary | ICD-10-CM | POA: Diagnosis present

## 2016-02-07 DIAGNOSIS — D631 Anemia in chronic kidney disease: Secondary | ICD-10-CM | POA: Diagnosis not present

## 2016-02-07 DIAGNOSIS — Z794 Long term (current) use of insulin: Secondary | ICD-10-CM | POA: Diagnosis not present

## 2016-02-07 DIAGNOSIS — E11649 Type 2 diabetes mellitus with hypoglycemia without coma: Secondary | ICD-10-CM | POA: Diagnosis present

## 2016-02-07 DIAGNOSIS — Z886 Allergy status to analgesic agent status: Secondary | ICD-10-CM | POA: Diagnosis not present

## 2016-02-07 DIAGNOSIS — Z888 Allergy status to other drugs, medicaments and biological substances status: Secondary | ICD-10-CM | POA: Diagnosis not present

## 2016-02-07 DIAGNOSIS — Z23 Encounter for immunization: Secondary | ICD-10-CM | POA: Diagnosis not present

## 2016-02-07 DIAGNOSIS — E782 Mixed hyperlipidemia: Secondary | ICD-10-CM | POA: Diagnosis not present

## 2016-02-07 DIAGNOSIS — Z882 Allergy status to sulfonamides status: Secondary | ICD-10-CM

## 2016-02-07 DIAGNOSIS — Z8249 Family history of ischemic heart disease and other diseases of the circulatory system: Secondary | ICD-10-CM

## 2016-02-07 DIAGNOSIS — E1122 Type 2 diabetes mellitus with diabetic chronic kidney disease: Secondary | ICD-10-CM | POA: Diagnosis not present

## 2016-02-07 DIAGNOSIS — Z809 Family history of malignant neoplasm, unspecified: Secondary | ICD-10-CM | POA: Diagnosis not present

## 2016-02-07 DIAGNOSIS — Z955 Presence of coronary angioplasty implant and graft: Secondary | ICD-10-CM

## 2016-02-07 DIAGNOSIS — N179 Acute kidney failure, unspecified: Secondary | ICD-10-CM

## 2016-02-07 DIAGNOSIS — Z8673 Personal history of transient ischemic attack (TIA), and cerebral infarction without residual deficits: Secondary | ICD-10-CM | POA: Diagnosis not present

## 2016-02-07 DIAGNOSIS — Z9889 Other specified postprocedural states: Secondary | ICD-10-CM

## 2016-02-07 DIAGNOSIS — Z90711 Acquired absence of uterus with remaining cervical stump: Secondary | ICD-10-CM | POA: Diagnosis not present

## 2016-02-07 DIAGNOSIS — Z79899 Other long term (current) drug therapy: Secondary | ICD-10-CM

## 2016-02-07 DIAGNOSIS — N189 Chronic kidney disease, unspecified: Secondary | ICD-10-CM | POA: Diagnosis present

## 2016-02-07 DIAGNOSIS — I219 Acute myocardial infarction, unspecified: Secondary | ICD-10-CM

## 2016-02-07 LAB — URINALYSIS COMPLETE WITH MICROSCOPIC (ARMC ONLY)
Bilirubin Urine: NEGATIVE
Glucose, UA: >500 mg/dL — AB
Ketones, ur: NEGATIVE mg/dL
Nitrite: NEGATIVE
PH: 5 (ref 5.0–8.0)
Protein, ur: NEGATIVE mg/dL
SPECIFIC GRAVITY, URINE: 1.015 (ref 1.005–1.030)
SQUAMOUS EPITHELIAL / LPF: NONE SEEN

## 2016-02-07 LAB — BASIC METABOLIC PANEL
Anion gap: 10 (ref 5–15)
BUN: 73 mg/dL — AB (ref 6–20)
CO2: 18 mmol/L — ABNORMAL LOW (ref 22–32)
CREATININE: 2.91 mg/dL — AB (ref 0.44–1.00)
Calcium: 9.4 mg/dL (ref 8.9–10.3)
Chloride: 106 mmol/L (ref 101–111)
GFR calc Af Amer: 18 mL/min — ABNORMAL LOW (ref >60)
GFR calc non Af Amer: 16 mL/min — ABNORMAL LOW (ref >60)
Glucose, Bld: 405 mg/dL — ABNORMAL HIGH (ref 65–99)
Potassium: 3.7 mmol/L (ref 3.5–5.1)
SODIUM: 134 mmol/L — AB (ref 135–145)

## 2016-02-07 LAB — CBC WITH DIFFERENTIAL/PLATELET
BASOS PCT: 0 %
Basophils Absolute: 0 10*3/uL (ref 0–0.1)
EOS ABS: 0 10*3/uL (ref 0–0.7)
Eosinophils Relative: 1 %
HEMATOCRIT: 26.9 % — AB (ref 35.0–47.0)
Hemoglobin: 8.9 g/dL — ABNORMAL LOW (ref 12.0–16.0)
LYMPHS ABS: 1.1 10*3/uL (ref 1.0–3.6)
Lymphocytes Relative: 16 %
MCH: 32.1 pg (ref 26.0–34.0)
MCHC: 33 g/dL (ref 32.0–36.0)
MCV: 97.1 fL (ref 80.0–100.0)
MONO ABS: 0.3 10*3/uL (ref 0.2–0.9)
MONOS PCT: 5 %
NEUTROS ABS: 5.1 10*3/uL (ref 1.4–6.5)
Neutrophils Relative %: 78 %
PLATELETS: 160 10*3/uL (ref 150–440)
RBC: 2.77 MIL/uL — ABNORMAL LOW (ref 3.80–5.20)
RDW: 13.5 % (ref 11.5–14.5)
WBC: 6.6 10*3/uL (ref 3.6–11.0)

## 2016-02-07 LAB — PREPARE RBC (CROSSMATCH)

## 2016-02-07 LAB — TROPONIN I
TROPONIN I: 2.72 ng/mL — AB (ref <0.031)
Troponin I: 1.86 ng/mL — ABNORMAL HIGH (ref <0.031)

## 2016-02-07 LAB — GLUCOSE, CAPILLARY
GLUCOSE-CAPILLARY: 392 mg/dL — AB (ref 65–99)
Glucose-Capillary: 358 mg/dL — ABNORMAL HIGH (ref 65–99)

## 2016-02-07 MED ORDER — PROMETHAZINE HCL 25 MG PO TABS
25.0000 mg | ORAL_TABLET | Freq: Four times a day (QID) | ORAL | Status: DC | PRN
  Filled 2016-02-07: qty 1

## 2016-02-07 MED ORDER — VITAMIN D 1000 UNITS PO TABS
5000.0000 [IU] | ORAL_TABLET | Freq: Every day | ORAL | Status: DC
  Administered 2016-02-08 – 2016-02-11 (×4): 5000 [IU] via ORAL
  Filled 2016-02-07 (×4): qty 5

## 2016-02-07 MED ORDER — PNEUMOCOCCAL VAC POLYVALENT 25 MCG/0.5ML IJ INJ
0.5000 mL | INJECTION | INTRAMUSCULAR | Status: AC
  Administered 2016-02-08: 0.5 mL via INTRAMUSCULAR
  Filled 2016-02-07: qty 0.5

## 2016-02-07 MED ORDER — CLOPIDOGREL BISULFATE 75 MG PO TABS
75.0000 mg | ORAL_TABLET | Freq: Every day | ORAL | Status: DC
  Administered 2016-02-08 – 2016-02-10 (×3): 75 mg via ORAL
  Filled 2016-02-07 (×4): qty 1

## 2016-02-07 MED ORDER — SIMVASTATIN 20 MG PO TABS
20.0000 mg | ORAL_TABLET | Freq: Every day | ORAL | Status: DC
  Administered 2016-02-08 – 2016-02-10 (×4): 20 mg via ORAL
  Filled 2016-02-07 (×4): qty 1

## 2016-02-07 MED ORDER — SODIUM CHLORIDE 0.9% FLUSH
3.0000 mL | Freq: Two times a day (BID) | INTRAVENOUS | Status: DC
  Administered 2016-02-07 – 2016-02-09 (×4): 3 mL via INTRAVENOUS

## 2016-02-07 MED ORDER — FENOFIBRIC ACID 135 MG PO CPDR: 135.0000 mg | DELAYED_RELEASE_CAPSULE | Freq: Every day | ORAL | Status: DC

## 2016-02-07 MED ORDER — NITROGLYCERIN 0.4 MG SL SUBL
0.4000 mg | SUBLINGUAL_TABLET | SUBLINGUAL | Status: DC | PRN
Start: 2016-02-07 — End: 2016-02-11
  Administered 2016-02-09: 0.4 mg via SUBLINGUAL
  Filled 2016-02-07: qty 1

## 2016-02-07 MED ORDER — TOPIRAMATE 100 MG PO TABS
100.0000 mg | ORAL_TABLET | Freq: Two times a day (BID) | ORAL | Status: DC
  Administered 2016-02-07 – 2016-02-11 (×8): 100 mg via ORAL
  Filled 2016-02-07 (×11): qty 1

## 2016-02-07 MED ORDER — SODIUM CHLORIDE 0.9 % IV SOLN
Freq: Once | INTRAVENOUS | Status: AC
  Administered 2016-02-08: via INTRAVENOUS

## 2016-02-07 MED ORDER — INFLUENZA VAC SPLIT QUAD 0.5 ML IM SUSY
0.5000 mL | PREFILLED_SYRINGE | INTRAMUSCULAR | Status: AC
  Administered 2016-02-08: 0.5 mL via INTRAMUSCULAR
  Filled 2016-02-07: qty 0.5

## 2016-02-07 MED ORDER — GABAPENTIN 300 MG PO CAPS
600.0000 mg | ORAL_CAPSULE | Freq: Three times a day (TID) | ORAL | Status: DC
  Administered 2016-02-08 – 2016-02-09 (×5): 600 mg via ORAL
  Filled 2016-02-07 (×6): qty 2

## 2016-02-07 MED ORDER — ONDANSETRON HCL 4 MG/2ML IJ SOLN
4.0000 mg | Freq: Once | INTRAMUSCULAR | Status: AC
  Administered 2016-02-07: 4 mg via INTRAVENOUS
  Filled 2016-02-07: qty 2

## 2016-02-07 MED ORDER — FLUOXETINE HCL 20 MG PO CAPS
20.0000 mg | ORAL_CAPSULE | Freq: Two times a day (BID) | ORAL | Status: DC
  Administered 2016-02-07 – 2016-02-11 (×8): 20 mg via ORAL
  Filled 2016-02-07 (×7): qty 1

## 2016-02-07 MED ORDER — DIPHENHYDRAMINE HCL 25 MG PO CAPS
25.0000 mg | ORAL_CAPSULE | Freq: Once | ORAL | Status: AC
  Administered 2016-02-08: 25 mg via ORAL
  Filled 2016-02-07 (×2): qty 1

## 2016-02-07 MED ORDER — MORPHINE SULFATE (PF) 4 MG/ML IV SOLN
4.0000 mg | Freq: Once | INTRAVENOUS | Status: AC
Start: 2016-02-07 — End: 2016-02-07
  Administered 2016-02-07: 4 mg via INTRAVENOUS
  Filled 2016-02-07: qty 1

## 2016-02-07 MED ORDER — PANTOPRAZOLE SODIUM 40 MG IV SOLR
40.0000 mg | Freq: Two times a day (BID) | INTRAVENOUS | Status: DC
  Administered 2016-02-08 – 2016-02-10 (×7): 40 mg via INTRAVENOUS
  Filled 2016-02-07 (×8): qty 40

## 2016-02-07 MED ORDER — ASPIRIN 81 MG PO CHEW
81.0000 mg | CHEWABLE_TABLET | Freq: Every day | ORAL | Status: DC
  Administered 2016-02-08 – 2016-02-10 (×4): 81 mg via ORAL
  Filled 2016-02-07 (×4): qty 1

## 2016-02-07 MED ORDER — FENOFIBRATE 145 MG PO TABS
145.0000 mg | ORAL_TABLET | Freq: Every day | ORAL | Status: DC
  Administered 2016-02-08 – 2016-02-11 (×4): 145 mg via ORAL
  Filled 2016-02-07 (×5): qty 1

## 2016-02-07 MED ORDER — ALPRAZOLAM 0.5 MG PO TABS
0.5000 mg | ORAL_TABLET | Freq: Three times a day (TID) | ORAL | Status: DC | PRN
  Administered 2016-02-10: 0.5 mg via ORAL
  Administered 2016-02-10 (×2): 1 mg via ORAL
  Filled 2016-02-07 (×2): qty 2
  Filled 2016-02-07 (×2): qty 1

## 2016-02-07 MED ORDER — INSULIN ASPART 100 UNIT/ML ~~LOC~~ SOLN
0.0000 [IU] | Freq: Three times a day (TID) | SUBCUTANEOUS | Status: DC
  Administered 2016-02-09: 7 [IU] via SUBCUTANEOUS
  Filled 2016-02-07: qty 7

## 2016-02-07 NOTE — H&P (Signed)
[facility] [place] NAME: [name]    MR#:  [id number]  DATE OF BIRTH:  1951/02/14  DATE OF ADMISSION:  02/07/2016  PRIMARY CARE PHYSICIAN: [name], MD   REQUESTING/REFERRING PHYSICIAN: [place]  CHIEF COMPLAINT:   Chief Complaint  Patient presents with  . Hyperglycemia  . Chest Pain  . Nausea    HISTORY OF PRESENT ILLNESS: [name]  is a 65 y.o. female with a known history of diabetes, hyperlipidemia, chronic kidney disease, anemia, coronary artery disease and status post stent, TIA, gastroesophageal reflux disease, chronic renal failure, chronic anemia requiring blood transfusion in the past- for last few months has gradually been getting weaker and weaker and not able to eat enough. For last 2-3 days has on and off complain of chest tightness which is pressure-like in Center radiating to her back, Concerned with this family brought her to emergency room.  She was noted to have elevated troponin, her creatinine is 2.9 which is almost at her baseline, hemoglobin is 9 and her stool was brown which was tested guaiac positive by ER physician.  ER physician spoke to cardiologist on-call doctor fath- because patient has guaiac positive stool he suggested not to start on heparin drip and as the symptoms are going on for 2-3 days he suggested just to monitor for now and do conservative management.   PAST MEDICAL HISTORY:   Past Medical History  Diagnosis Date  . DIABETES MELLITUS, II, COMPLICATIONS 99991111  . HYPERLIPIDEMIA 02/27/2007  . 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 ANEMIA, OTHER, UNSPECIFIED 02/27/2007  . DEPRESSIVE DISORDER, NOS 02/27/2007  . HYPERTENSION, BENIGN SYSTEMIC 02/27/2007  . CORONARY, ARTERIOSCLEROSIS 02/27/2007  . TIA 05/17/2009  . GASTROESOPHAGEAL REFLUX, NO ESOPHAGITIS 02/27/2007  . RENAL INSUFFICIENCY, CHRONIC 12/28/2009  . Renal failure, acute on chronic (HCC) 01/25/2014  . Blood transfusion  without reported diagnosis   . Anemia     PAST SURGICAL HISTORY: Past Surgical History  Procedure Laterality Date  . Partial hysterectomy    . Rectocele repair    . Bladder surgery      Tack    SOCIAL HISTORY:  Social History  Substance Use Topics  . Smoking status: Never Smoker   . Smokeless tobacco: Never Used  . Alcohol Use: No    FAMILY HISTORY:  Family History  Problem Relation Age of Onset  . Cancer Mother     unknown  . CAD Mother   . Cancer Sister     unknown cancer ?breast ca    DRUG ALLERGIES:  Allergies  Allergen Reactions  . Hydralazine Hcl Swelling  . Bisphosphonates Rash  . Codeine Phosphate Nausea And Vomiting and Rash  . Penicillins Itching, Rash and Other (See Comments)    Has patient had a PCN reaction causing immediate rash, facial/tongue/throat swelling, SOB or lightheadedness with hypotension: No Has patient had a PCN reaction causing severe rash involving mucus membranes or skin necrosis: No Has patient had a PCN reaction that required hospitalization No Has patient had a PCN reaction occurring within the last 10 years: No If all of the above answers are "NO", then may proceed with Cephalosporin use.  . Sulfa Antibiotics Itching and Rash    REVIEW OF SYSTEMS:   CONSTITUTIONAL: No fever,positive for fatigue or weakness.  EYES: No blurred or double vision.  EARS, NOSE, AND THROAT: No tinnitus or ear pain.  RESPIRATORY: No cough, shortness of breath, wheezing or hemoptysis.  CARDIOVASCULAR: positive for chest pain,no  orthopnea, edema.  GASTROINTESTINAL: No nausea, vomiting, diarrhea or abdominal pain.  GENITOURINARY: No dysuria, hematuria.  ENDOCRINE: No polyuria, nocturia,  HEMATOLOGY: No anemia, easy bruising or bleeding SKIN: No rash or lesion. MUSCULOSKELETAL: No joint pain or arthritis.   NEUROLOGIC: No tingling, numbness, weakness.  PSYCHIATRY: No anxiety or depression.   MEDICATIONS AT HOME:  Prior to Admission medications    Medication Sig Start Date End Date Taking? Authorizing Provider  ALPRAZolam Duanne Moron) 1 MG tablet Take 0.5-1 mg by mouth 3 (three) times daily as needed for anxiety.    Yes Historical Provider, MD  aspirin 81 MG chewable tablet Chew 81 mg by mouth at bedtime.   Yes Historical Provider, MD  Cholecalciferol (VITAMIN D3) 5000 units TABS Take 5,000 Units by mouth daily.   Yes Historical Provider, MD  Choline Fenofibrate (FENOFIBRIC ACID) 135 MG CPDR Take 135 mg by mouth daily.   Yes Historical Provider, MD  clopidogrel (PLAVIX) 75 MG tablet Take 75 mg by mouth at bedtime.    Yes Historical Provider, MD  FLUoxetine (PROZAC) 20 MG capsule Take 20 mg by mouth 2 (two) times daily.    Yes Historical Provider, MD  gabapentin (NEURONTIN) 300 MG capsule Take 600 mg by mouth 3 (three) times daily.    Yes Historical Provider, MD  insulin aspart (NOVOLOG) 100 UNIT/ML injection Inject 2-12 Units into the skin 3 (three) times daily with meals as needed for high blood sugar. Pt uses as needed per sliding scale.   Yes Historical Provider, MD  insulin glargine (LANTUS) 100 UNIT/ML injection Inject 12 Units into the skin daily.   Yes Historical Provider, MD  ketoconazole (NIZORAL) 2 % shampoo Apply 1 application topically 2 (two) times a week.   Yes Historical Provider, MD  lisinopril (PRINIVIL,ZESTRIL) 2.5 MG tablet Take 2.5 mg by mouth daily.   Yes Historical Provider, MD  nitroGLYCERIN (NITROSTAT) 0.4 MG SL tablet Place 0.4 mg under the tongue every 5 (five) minutes as needed for chest pain.    Yes Historical Provider, MD  pantoprazole (PROTONIX) 40 MG tablet Take 1 tablet (40 mg total) by mouth daily. 06/26/15  Yes Theodoro Grist, MD  promethazine (PHENERGAN) 25 MG tablet Take 25 mg by mouth every 6 (six) hours as needed for nausea or vomiting.    Yes Lucille Passy, MD  simvastatin (ZOCOR) 20 MG tablet Take 20 mg by mouth at bedtime.    Yes Historical Provider, MD  topiramate (TOPAMAX) 100 MG tablet Take 100 mg by mouth  2 (two) times daily.    Yes Historical Provider, MD      PHYSICAL EXAMINATION:   VITAL SIGNS: Blood pressure 120/52, pulse 85, temperature 97.5 F (36.4 C), temperature source Oral, resp. rate 14, height 5\' 3"  (1.6 m), weight 47.628 kg (105 lb), SpO2 100 %.  GENERAL:  65 y.o.-year-old thin patient lying in the bed with no acute distress.  EYES: Pupils equal, round, reactive to light and accommodation. No scleral icterus. Extraocular muscles intact. Conjunctiva pale. HEENT: Head atraumatic, normocephalic. Oropharynx and nasopharynx clear.  NECK:  Supple, no jugular venous distention. No thyroid enlargement, no tenderness.  LUNGS: Normal breath sounds bilaterally, no wheezing, rales,rhonchi or crepitation. No use of accessory muscles of respiration.  CARDIOVASCULAR: S1, S2 normal. No murmurs, rubs, or gallops.  ABDOMEN: Soft, nontender, nondistended. Bowel sounds present. No organomegaly or mass.  EXTREMITIES: No pedal edema, cyanosis, or clubbing.  NEUROLOGIC: Cranial nerves II through XII are intact. Muscle strength 4/5 in all extremities. Sensation  intact. Gait not checked.  PSYCHIATRIC: The patient is alert and oriented x 3.  SKIN: No obvious rash, lesion, or ulcer.   LABORATORY PANEL:   CBC  Recent Labs Lab 02/07/16 1708  WBC 6.6  HGB 8.9*  HCT 26.9*  PLT 160  MCV 97.1  MCH 32.1  MCHC 33.0  RDW 13.5  LYMPHSABS 1.1  MONOABS 0.3  EOSABS 0.0  BASOSABS 0.0   ------------------------------------------------------------------------------------------------------------------  Chemistries   Recent Labs Lab 02/07/16 1708  NA 134*  K 3.7  CL 106  CO2 18*  GLUCOSE 405*  BUN 73*  CREATININE 2.91*  CALCIUM 9.4   ------------------------------------------------------------------------------------------------------------------ estimated creatinine clearance is 14.3 mL/min (by C-G formula based on Cr of  2.91). ------------------------------------------------------------------------------------------------------------------ No results for input(s): TSH, T4TOTAL, T3FREE, THYROIDAB in the last 72 hours.  Invalid input(s): FREET3   Coagulation profile No results for input(s): INR, PROTIME in the last 168 hours. ------------------------------------------------------------------------------------------------------------------- No results for input(s): DDIMER in the last 72 hours. -------------------------------------------------------------------------------------------------------------------  Cardiac Enzymes  Recent Labs Lab 02/07/16 1708  TROPONINI 2.72*   ------------------------------------------------------------------------------------------------------------------ Invalid input(s): POCBNP  ---------------------------------------------------------------------------------------------------------------  Urinalysis    Component Value Date/Time   COLORURINE YELLOW* 02/07/2016 1905   COLORURINE Yellow 05/04/2014 0914   APPEARANCEUR CLEAR* 02/07/2016 1905   APPEARANCEUR Clear 05/04/2014 0914   LABSPEC 1.015 02/07/2016 1905   LABSPEC 1.010 05/04/2014 0914   LABSPEC 1.020 02/14/2011 1654   PHURINE 5.0 02/07/2016 1905   PHURINE 5.0 05/04/2014 0914   PHURINE 6.0 02/14/2011 1654   GLUCOSEU >500* 02/07/2016 1905   GLUCOSEU >=500 05/04/2014 0914   HGBUR 1+* 02/07/2016 1905   HGBUR 1+ 05/04/2014 0914   HGBUR Negative 02/14/2011 1654   HGBUR negative 05/17/2009 [place] 02/07/2016 1905   BILIRUBINUR Negative 05/04/2014 0914   BILIRUBINUR Negative 02/14/2011 1654   KETONESUR NEGATIVE 02/07/2016 1905   KETONESUR Negative 05/04/2014 0914   KETONESUR Negative 02/14/2011 1654   PROTEINUR NEGATIVE 02/07/2016 1905   PROTEINUR Negative 05/04/2014 0914   PROTEINUR Negative 02/14/2011 1654   UROBILINOGEN 0.2 09/17/2009 0233   NITRITE NEGATIVE 02/07/2016 1905    NITRITE Negative 05/04/2014 0914   NITRITE Negative 02/14/2011 1654   LEUKOCYTESUR 1+* 02/07/2016 1905   LEUKOCYTESUR 2+ 05/04/2014 0914   LEUKOCYTESUR Trace 02/14/2011 1654     RADIOLOGY: Dg Chest 2 View  02/07/2016  CLINICAL DATA:  Hyperglycemia and chest pain 1 day prior EXAM: CHEST  2 VIEW COMPARISON:  June 24, 2015 FINDINGS: There is no edema or consolidation. The heart size and pulmonary vascularity are normal. No adenopathy. There is a coronary stent in the circumflex region. IMPRESSION: No edema or consolidation. Electronically Signed   By: Lowella Grip III M.D.   On: 02/07/2016 17:44    EKG: Orders placed or performed during the hospital encounter of 02/07/16  . ED EKG within 10 minutes  . ED EKG within 10 minutes  . EKG 12-Lead  . EKG 12-Lead  . EKG 12-Lead  . EKG 12-Lead    IMPRESSION AND PLAN:  * Acute non-ST segment MI  Patient has history of coronary artery disease and 2 stents.  His already taking aspirin and Plavix at home.  In the past she had similar complaint but coronary syndrome was not done because of concern of renal failure.  Today ER physician spoke to cardiologist and he suggested conservative management as patient has guaiac positive stool and renal failure.  We will not start on heparin drip because of the above-mentioned reason as  suggested by cardiologist.  Monitor on telemetry and follow serial troponin.  * Chronic renal failure  Appears to be at baseline, we'll call nephrology consult.  * Anemia  Possibly due to chronic disease off all chronic kidney disease and GI blood loss  Stool guaiac is positive as per ER physician.  As she presented with acute MI hemoglobin is 9 but I will transfuse 1 unit of PRBC per  I discussed this with the family her son and daughter-in-law and they agree.  * Diabetes  As oriented to be limited for now so we'll just keep her on insulin sliding scale coverage but not an and basic long-acting insulin.  *  Hyperlipidemia   continue statin.  All the records are reviewed and case discussed with ED provider. Management plans discussed with the patient, family and they are in agreement.  CODE STATUS: full code  I asked patient about her Power of attorney, she did not decided yet about we will make decisions for her in this situation but I encouraged her strongly to think about it and let her other family members know.  Code Status History    Date Active Date Inactive Code Status Order ID Comments User Context   06/23/2015  8:50 PM 06/26/2015  5:10 PM Full Code [id number]  Bettey Costa, MD Inpatient       TOTAL TIME TAKING CARE OF THIS PATIENT: 50 critical care  minutes.  Condition is critical because of presence of non-ST elevation MI and GI bleed, I discussed with his family and explaining them about the available options and the treatment plan, they agree with that for now.   Vaughan Basta M.D on 02/07/2016   Between 7am to 6pm - Pager - [phone]  After 6pm go to [email or web address] - password [email or web address] [facility]  Office  [phone]  CC: Primary care physician; [name], MD   Note: This dictation was prepared with Dragon dictation along with smaller phrase technology. Any transcriptional errors that result from this process are unintentional.

## 2016-02-07 NOTE — ED Notes (Signed)
Critical Lab value 2.72 Troponin, MD made aware.

## 2016-02-07 NOTE — ED Notes (Signed)
Pt to ed from dr hedricks office for care,  Pt with elevated blood sugar of 500,  Also reports chest pain last night.  Denies chest pain now.  Reports nausea and vomiting since yesterday,  Denies sob.

## 2016-02-07 NOTE — ED Provider Notes (Addendum)
[facility] Emergency Department Provider Note  ____________________________________________  Time seen: Approximately [id number] PM  I have reviewed the triage vital signs and the nursing notes.   HISTORY  Chief Complaint Hyperglycemia; Chest Pain; and Nausea    HPI [name] is a 65 y.o. female with a history of diabetes and coronary artery disease was presenting today with 2 weeks of nausea vomiting diarrhea and body aches. She also says that she has been having chest pain which is been sharp and aching to the middle, left and left lower part of the chest over the past 2-3 days.  She denies any radiation of the pain. Says that she also is having back pain but says she has chronic back pain which has been worsening. She says that she also has a cough and runny nose. Denies any known sick contacts. Was seen at her primary care doctor's office today and sent to the emergency department for further evaluation.   Past Medical History  Diagnosis Date  . DIABETES MELLITUS, II, COMPLICATIONS 99991111  . HYPERLIPIDEMIA 02/27/2007  . 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 ANEMIA, OTHER, UNSPECIFIED 02/27/2007  . DEPRESSIVE DISORDER, NOS 02/27/2007  . HYPERTENSION, BENIGN SYSTEMIC 02/27/2007  . CORONARY, ARTERIOSCLEROSIS 02/27/2007  . TIA 05/17/2009  . GASTROESOPHAGEAL REFLUX, NO ESOPHAGITIS 02/27/2007  . RENAL INSUFFICIENCY, CHRONIC 12/28/2009  . Renal failure, acute on chronic (HCC) 01/25/2014  . Blood transfusion without reported diagnosis   . Anemia     Patient Active Problem List   Diagnosis Date Noted  . Chronic kidney disease (CKD), stage IV (severe) ([place]) 06/26/2015  . Chest pain with high risk for cardiac etiology 06/24/2015  . Low hemoglobin 02/02/2014  . Abdominal pain, epigastric 02/02/2014  . Black stools 02/02/2014  . Anemia 01/25/2014  . Renal failure, acute on chronic (HCC) 01/25/2014  . MGUS (monoclonal gammopathy of unknown significance) 11/02/2013   . OSTEOARTHROS UNSPEC GEN/LOC OTH SPEC SITES 07/05/2010  . HEAD TRAUMA 05/04/2010  . CELLULITIS AND ABSCESS OF UNSPECIFIED SITE 05/02/2010  . INSOMNIA, CHRONIC 04/06/2010  . HYPERKALEMIA 03/02/2010  . LEG CRAMPS 02/28/2010  . GASTROENTERITIS WITHOUT DEHYDRATION 02/21/2010  . HAIR LOSS 01/31/2010  . PROCTITIS 12/29/2009  . Chronic kidney disease 12/28/2009  . PRURITUS 10/04/2009  . ULCER, RECTUM 09/28/2009  . UNSPECIFIED CONSTIPATION 09/17/2009  . 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 FATIGUE 08/05/2009  . ABDOMINAL PAIN, UNSPECIFIED SITE 08/05/2009  . TIA 05/17/2009  . 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 ANXIETY 06/30/2008  . URGENCY OF URINATION 06/30/2008  . DIABETES MELLITUS, II, COMPLICATIONS [id number]  . HYPERLIPIDEMIA 02/27/2007  . Anemia 02/27/2007  . DEPRESSIVE DISORDER, NOS 02/27/2007  . HYPERTENSION, BENIGN SYSTEMIC 02/27/2007  . CORONARY, ARTERIOSCLEROSIS 02/27/2007  . GASTROESOPHAGEAL REFLUX, NO ESOPHAGITIS 02/27/2007  . BACK PAIN, LOW 02/27/2007  . FIBROMYALGIA, FIBROMYOSITIS 02/27/2007    Past Surgical History  Procedure Laterality Date  . Partial hysterectomy    . Rectocele repair    . Bladder surgery      Tack    Current Outpatient Rx  Name  Route  Sig  Dispense  Refill  . ALPRAZolam (XANAX) 1 MG tablet      1 tab by mouth 3 times a day as needed for anxiety   90 tablet   0   . aspirin 81 MG tablet   Oral   Take 81 mg by mouth daily.           . Cholecalciferol (VITAMIN D-3 PO)   Oral  Take 5,000 Int'l Units by mouth daily.           . clopidogrel (PLAVIX) 75 MG tablet   Oral   Take 75 mg by mouth daily.           Marland Kitchen dicyclomine (BENTYL) 10 MG capsule   Oral   Take 10 mg by mouth 2 (two) times daily at 10 AM and 5 PM.         . FLUoxetine (PROZAC) 20 MG capsule   Oral   Take 20 mg by mouth daily.         Marland Kitchen gabapentin (NEURONTIN) 300 MG capsule   Oral   Take 600 mg by mouth 3 (three) times daily.           . insulin aspart (NOVOLOG) 100 UNIT/ML injection   Subcutaneous   Inject 4-10 Units into the skin 3 (three) times daily with meals. Sliding scale         . insulin glargine (LANTUS) 100 UNIT/ML injection   Subcutaneous   Inject 0.13 mLs (13 Units total) into the skin daily.   10 mL   11   . ketoconazole (NIZORAL) 2 % cream   Topical   Apply 1 application topically daily.   60 g   2   . levofloxacin (LEVAQUIN) 750 MG tablet   Oral   Take 1 tablet (750 mg total) by mouth daily. Patient not taking: Reported on 07/21/2015   4 tablet   0   . lisinopril (PRINIVIL,ZESTRIL) 2.5 MG tablet   Oral   Take 2.5 mg by mouth daily.         . Naftifine HCl (NAFTIN) 2 % CREA   Apply externally   Apply 1 application topically 1 day or 1 dose.   60 g   2   . nitroGLYCERIN (NITROSTAT) 0.4 MG SL tablet   Sublingual   Place 0.4 mg under the tongue every 5 (five) minutes as needed.           . pantoprazole (PROTONIX) 40 MG tablet   Oral   Take 1 tablet (40 mg total) by mouth daily.   30 tablet   6   . promethazine (PHENERGAN) 25 MG tablet   Oral   Take 25 mg by mouth. by mouth every six hours as needed for nausea         . simvastatin (ZOCOR) 20 MG tablet   Oral   Take 20 mg by mouth at bedtime.          . topiramate (TOPAMAX) 100 MG tablet   Oral   Take 100 mg by mouth 2 (two) times daily.            Allergies Bisphosphonates; Codeine phosphate; Hydralazine hcl; Penicillins; and Sulfa antibiotics  Family History  Problem Relation Age of Onset  . Cancer Mother     unknown  . CAD Mother   . Cancer Sister     unknown cancer ?breast ca    Social History Social History  Substance Use Topics  . Smoking status: Never Smoker   . Smokeless tobacco: Never Used  . Alcohol Use: No    Review of Systems Constitutional: No fever/chills Eyes: No visual changes. ENT: No sore throat. Cardiovascular: As above Respiratory: As above Gastrointestinal: No  abdominal pain.  No constipation. Genitourinary: Negative for dysuria. Musculoskeletal: Chronic back pain to the left thoracic as well as left lower lumbar. Skin: Negative for rash. Neurological: Negative for headaches, focal  weakness or numbness.  10-point ROS otherwise negative.  ____________________________________________   PHYSICAL EXAM:  VITAL SIGNS: ED Triage Vitals  Enc Vitals Group     BP 02/07/16 1705 147/54 mmHg     Pulse Rate 02/07/16 1705 90     Resp 02/07/16 1705 18     Temp 02/07/16 1705 97.5 F (36.4 C)     Temp Source 02/07/16 1705 Oral     SpO2 02/07/16 1705 100 %     Weight 02/07/16 1705 105 lb (47.628 kg)     Height 02/07/16 1705 5\' 3"  (1.6 m)     Head Cir --      Peak Flow --      Pain Score 02/07/16 1706 0     Pain Loc --      Pain Edu? --      Excl. in [place]? --     Constitutional: Alert and oriented. Well appearing and in no acute distress. Eyes: Conjunctivae are normal. PERRL. EOMI. Head: Atraumatic. Nose: No congestion/rhinnorhea. Mouth/Throat: Mucous membranes are moist.  Oropharynx non-erythematous. Neck: No stridor.   Cardiovascular: Normal rate, regular rhythm. Grossly normal heart sounds.  Good peripheral circulation. Bilateral and equal radial as well as dorsalis pedis pulses. Respiratory: Normal respiratory effort.  No retractions. Lungs CTAB. Gastrointestinal: Soft and nontender. No distention. No abdominal bruits. No CVA tenderness. Patient with brown stool which is mild to moderately heme positive. No visible external hemorrhoids. Musculoskeletal: No lower extremity tenderness nor edema.  No joint effusions. Neurologic:  Normal speech and language. No gross focal neurologic deficits are appreciated. No gait instability. Skin:  Skin is warm, dry and intact. No rash noted. Psychiatric: Mood and affect are normal. Speech and behavior are normal.  ____________________________________________   LABS (all labs ordered are listed, but only  abnormal results are displayed)  Labs Reviewed  BASIC METABOLIC PANEL - Abnormal; Notable for the following:    Sodium 134 (*)    CO2 18 (*)    Glucose, Bld 405 (*)    BUN 73 (*)    Creatinine, Ser 2.91 (*)    GFR calc non Af Amer 16 (*)    GFR calc Af Amer 18 (*)    All other components within normal limits  TROPONIN I - Abnormal; Notable for the following:    Troponin I 2.72 (*)    All other components within normal limits  GLUCOSE, CAPILLARY - Abnormal; Notable for the following:    Glucose-Capillary 392 (*)    All other components within normal limits  URINALYSIS COMPLETEWITH MICROSCOPIC ([facility] ONLY)  CBC WITH DIFFERENTIAL/PLATELET  CBG MONITORING, ED   ____________________________________________  EKG  ED ECG REPORT I, Doran Stabler, the attending physician, personally viewed and interpreted this ECG.   Date: 02/07/2016  EKG Time: 1709  Rate: 76  Rhythm: normal sinus rhythm with sinus arrhythmia  Axis: Normal axis  Intervals:none  ST&T Change: No ST segment elevation or depression. No abnormal T-wave inversion.  ED ECG REPORT I, Doran Stabler, the attending physician, personally viewed and interpreted this ECG.   Date: 02/07/2016  EKG Time: 1839  Rate: 82  Rhythm: normal sinus rhythm with sinus arrhythmia.  Axis: Normal axis  Intervals: Borderline long QT  ST&T Change: No ST segment elevation or depression. No abnormal T-wave inversion.   ____________________________________________  RADIOLOGY  Chest x-ray without any edema or consolidation. ____________________________________________   PROCEDURES    ____________________________________________   INITIAL IMPRESSION / ASSESSMENT AND PLAN /  ED COURSE  Pertinent labs & imaging results that were available during my care of the patient were reviewed by me and considered in my medical decision making (see chart for details).  ----------------------------------------- 7:22 PM on  02/07/2016 -----------------------------------------  Patient with elevated troponin to 2.72. Discussed case with Dr. Ubaldo Glassing of cardiology. Says given length of symptoms as well as EKG findings that he is thinking this morning be a mild or pericarditis with a troponin leak. I discussed with him that the patient is heme positive rectally as well as has a history of chronic anemia and he advises against any anticoagulation at this time. I explained this plan to the patient as well as her daughter who is at the bedside. I also extended the patient will need to be admitted to the hospital. They're understanding of the plan and willing to comply. ____________________________________________   FINAL CLINICAL IMPRESSION(S) / ED DIAGNOSES  Myocarditis.    Orbie Pyo, MD 02/07/16 1924  Signed out to Dr. Wadie Lessen, MD 02/07/16 [phone]

## 2016-02-08 ENCOUNTER — Inpatient Hospital Stay: Payer: Commercial Managed Care - HMO | Attending: Oncology

## 2016-02-08 ENCOUNTER — Inpatient Hospital Stay: Payer: Commercial Managed Care - HMO

## 2016-02-08 DIAGNOSIS — I214 Non-ST elevation (NSTEMI) myocardial infarction: Secondary | ICD-10-CM | POA: Diagnosis not present

## 2016-02-08 LAB — GLUCOSE, CAPILLARY
GLUCOSE-CAPILLARY: 177 mg/dL — AB (ref 65–99)
GLUCOSE-CAPILLARY: 50 mg/dL — AB (ref 65–99)
GLUCOSE-CAPILLARY: 189 mg/dL — AB (ref 65–99)
GLUCOSE-CAPILLARY: 96 mg/dL (ref 65–99)
GLUCOSE-CAPILLARY: 180 mg/dL — AB (ref 65–99)
Glucose-Capillary: 72 mg/dL (ref 65–99)
Glucose-Capillary: 46 mg/dL — ABNORMAL LOW (ref 65–99)

## 2016-02-08 LAB — URINALYSIS COMPLETE WITH MICROSCOPIC (ARMC ONLY)
BILIRUBIN URINE: NEGATIVE
Glucose, UA: 150 mg/dL — AB
HGB URINE DIPSTICK: NEGATIVE
Ketones, ur: NEGATIVE mg/dL
Nitrite: NEGATIVE
PH: 5 (ref 5.0–8.0)
Protein, ur: NEGATIVE mg/dL
Specific Gravity, Urine: 1.014 (ref 1.005–1.030)

## 2016-02-08 LAB — CBC
HCT: 29.9 % — ABNORMAL LOW (ref 35.0–47.0)
HEMOGLOBIN: 9.9 g/dL — AB (ref 12.0–16.0)
MCH: 31.2 pg (ref 26.0–34.0)
MCHC: 33.2 g/dL (ref 32.0–36.0)
MCV: 93.9 fL (ref 80.0–100.0)
Platelets: 144 10*3/uL — ABNORMAL LOW (ref 150–440)
RBC: 3.19 MIL/uL — AB (ref 3.80–5.20)
RDW: 14.8 % — ABNORMAL HIGH (ref 11.5–14.5)
WBC: 5.4 10*3/uL (ref 3.6–11.0)

## 2016-02-08 LAB — TROPONIN I: TROPONIN I: 1.66 ng/mL — AB (ref <0.031)

## 2016-02-08 LAB — BASIC METABOLIC PANEL
ANION GAP: 6 (ref 5–15)
BUN: 59 mg/dL — ABNORMAL HIGH (ref 6–20)
CALCIUM: 8.7 mg/dL — AB (ref 8.9–10.3)
CO2: 22 mmol/L (ref 22–32)
Chloride: 109 mmol/L (ref 101–111)
Creatinine, Ser: 2.31 mg/dL — ABNORMAL HIGH (ref 0.44–1.00)
GFR, EST AFRICAN AMERICAN: 24 mL/min — AB (ref >60)
GFR, EST NON AFRICAN AMERICAN: 21 mL/min — AB (ref >60)
Glucose, Bld: 99 mg/dL (ref 65–99)
Potassium: 3.6 mmol/L (ref 3.5–5.1)
Sodium: 137 mmol/L (ref 135–145)

## 2016-02-08 MED ORDER — DEXTROSE 50 % IV SOLN
1.0000 | Freq: Once | INTRAVENOUS | Status: AC
  Administered 2016-02-08: 50 mL via INTRAVENOUS
  Filled 2016-02-08: qty 50

## 2016-02-08 MED ORDER — ACETAMINOPHEN 325 MG PO TABS
650.0000 mg | ORAL_TABLET | Freq: Four times a day (QID) | ORAL | Status: DC | PRN
  Administered 2016-02-08: 650 mg via ORAL
  Filled 2016-02-08: qty 2

## 2016-02-08 MED ORDER — TRAMADOL HCL 50 MG PO TABS
50.0000 mg | ORAL_TABLET | Freq: Four times a day (QID) | ORAL | Status: DC | PRN
  Administered 2016-02-09 – 2016-02-10 (×2): 50 mg via ORAL
  Filled 2016-02-08 (×2): qty 1

## 2016-02-08 MED ORDER — DEXTROSE 5 % IV SOLN
1.0000 g | INTRAVENOUS | Status: DC
  Administered 2016-02-09 – 2016-02-10 (×3): 1 g via INTRAVENOUS
  Filled 2016-02-08 (×4): qty 10

## 2016-02-08 MED ORDER — ISOSORBIDE MONONITRATE ER 30 MG PO TB24
30.0000 mg | ORAL_TABLET | Freq: Every day | ORAL | Status: DC
  Administered 2016-02-08 – 2016-02-09 (×2): 30 mg via ORAL
  Filled 2016-02-08 (×2): qty 1

## 2016-02-08 MED ORDER — INSULIN ASPART 100 UNIT/ML ~~LOC~~ SOLN
SUBCUTANEOUS | Status: AC
  Administered 2016-02-08: 5 [IU]
  Filled 2016-02-08: qty 5

## 2016-02-08 MED ORDER — MENTHOL 3 MG MT LOZG
1.0000 | LOZENGE | OROMUCOSAL | Status: DC | PRN
  Administered 2016-02-08: 3 mg via ORAL
  Filled 2016-02-08: qty 9

## 2016-02-08 NOTE — Progress Notes (Signed)
Patient has been resting in bed, family at bed side. Patient c/o of sore throat. Lozenges ordered. VSS.

## 2016-02-08 NOTE — Progress Notes (Signed)
[place] [facility] [place] NAME: [name]    MR#:  [id number]  DATE OF BIRTH:  1951/05/21  SUBJECTIVE:  CHIEF COMPLAINT:   Chief Complaint  Patient presents with  . Hyperglycemia  . Chest Pain  . Nausea      Found to have elevated troponin, guiac positive stool,and stable chronic renal failure, overall very weak.   Not a candidate for anticoagulant due to gi bleeding and anemia.  REVIEW OF SYSTEMS:   CONSTITUTIONAL: No fever,positive for fatigue or weakness.  EYES: No blurred or double vision.  EARS, NOSE, AND THROAT: No tinnitus or ear pain.  RESPIRATORY: No cough, shortness of breath, wheezing or hemoptysis.  CARDIOVASCULAR: positive for chest pain,no orthopnea, edema.  GASTROINTESTINAL: No nausea, vomiting, diarrhea or abdominal pain.  GENITOURINARY: No dysuria, hematuria.  ENDOCRINE: No polyuria, nocturia,  HEMATOLOGY: No anemia, easy bruising or bleeding SKIN: No rash or lesion. MUSCULOSKELETAL: No joint pain or arthritis.  NEUROLOGIC: No tingling, numbness, weakness.  PSYCHIATRY: No anxiety or depression.   ROS  DRUG ALLERGIES:   Allergies  Allergen Reactions  . Hydralazine Hcl Swelling  . Bisphosphonates Rash  . Codeine Phosphate Nausea And Vomiting and Rash  . Penicillins Itching, Rash and Other (See Comments)    Has patient had a PCN reaction causing immediate rash, facial/tongue/throat swelling, SOB or lightheadedness with hypotension: No Has patient had a PCN reaction causing severe rash involving mucus membranes or skin necrosis: No Has patient had a PCN reaction that required hospitalization No Has patient had a PCN reaction occurring within the last 10 years: No If all of the above answers are "NO", then may proceed with Cephalosporin use.  . Sulfa Antibiotics Itching and Rash    VITALS:  Blood pressure 142/55, pulse 86, temperature 99.4 F (37.4 C), temperature source Oral, resp. rate  16, height 5\' 3"  (1.6 m), weight 49.76 kg (109 lb 11.2 oz), SpO2 97 %.  PHYSICAL EXAMINATION:   GENERAL: 65 y.o.-year-old thin patient lying in the bed with no acute distress.  EYES: Pupils equal, round, reactive to light and accommodation. No scleral icterus. Extraocular muscles intact. Conjunctiva pale. HEENT: Head atraumatic, normocephalic. Oropharynx and nasopharynx clear.  NECK: Supple, no jugular venous distention. No thyroid enlargement, no tenderness.  LUNGS: Normal breath sounds bilaterally, no wheezing, rales,rhonchi or crepitation. No use of accessory muscles of respiration.  CARDIOVASCULAR: S1, S2 normal. No murmurs, rubs, or gallops.  ABDOMEN: Soft, nontender, nondistended. Bowel sounds present. No organomegaly or mass.  EXTREMITIES: No pedal edema, cyanosis, or clubbing.  NEUROLOGIC: Cranial nerves II through XII are intact. Muscle strength 4/5 in all extremities. Sensation intact. Gait not checked.  PSYCHIATRIC: The patient is alert and oriented x 3.  SKIN: No obvious rash, lesion, or ulcer.   Physical Exam LABORATORY PANEL:   CBC  Recent Labs Lab 02/08/16 0544  WBC 5.4  HGB 9.9*  HCT 29.9*  PLT 144*   ------------------------------------------------------------------------------------------------------------------  Chemistries   Recent Labs Lab 02/08/16 0544  NA 137  K 3.6  CL 109  CO2 22  GLUCOSE 99  BUN 59*  CREATININE 2.31*  CALCIUM 8.7*   ------------------------------------------------------------------------------------------------------------------  Cardiac Enzymes  Recent Labs Lab 02/07/16 2228 02/08/16 0544  TROPONINI 1.86* 1.66*   ------------------------------------------------------------------------------------------------------------------  RADIOLOGY:  Dg Chest 2 View  02/07/2016  CLINICAL DATA:  Hyperglycemia and chest pain 1 day prior EXAM: CHEST  2 VIEW COMPARISON:  June 24, 2015 FINDINGS: There is no edema or  consolidation. The heart size and pulmonary vascularity are normal. No adenopathy. There is a coronary stent in the circumflex region. IMPRESSION: No edema or consolidation. Electronically Signed   By: Lowella Grip III M.D.   On: 02/07/2016 17:44   US Renal  02/08/2016  CLINICAL DATA:  Acute renal failure EXAM: RENAL / URINARY TRACT ULTRASOUND COMPLETE COMPARISON:  None. FINDINGS: Right Kidney: Length: 9.9 cm. Echogenic renal parenchyma. No mass or hydronephrosis. Left Kidney: Length: 10.2 cm. Echogenic renal parenchyma. No mass or hydronephrosis. Bladder: Underdistended. IMPRESSION: Echogenic renal parenchyma, suggesting medical renal disease. No hydronephrosis. Electronically Signed   By: Julian Hy M.D.   On: 02/08/2016 17:35    ASSESSMENT AND PLAN:   Principal Problem:   Acute MI [facility]) Active Problems:   Chronic kidney disease   NSTEMI (non-ST elevated myocardial infarction) ([place])  * Acute non-ST segment MI Patient has history of coronary artery disease and 2 stents. already taking aspirin and Plavix at home. In the past she had similar complaint but coronary syndrome was not done because of concern of renal failure. we will do conservative management as patient has guaiac positive stool and renal failure. We will not start on heparin drip because of the above-mentioned reason  Monitor on telemetry and follow serial troponin.   Follow cardiology consult.  * Chronic renal failure Appears to be at baseline, nephrology consult.  * Anemia Possibly due to chronic disease off all chronic kidney disease and GI blood loss Stool guaiac is positive as per ER physician. As she presented with acute MI hemoglobin is 9    Transfused one unit PRBC here.  * Diabetes As oriented to be limited for now so we'll just keep her on insulin sliding scale coverage but not an and basic long-acting insulin.  * Hyperlipidemia  continue statin.  All the records are reviewed and  case discussed with Care Management/Social Workerr. Management plans discussed with the patient, family and they are in agreement.  CODE STATUS: full  TOTAL TIME TAKING CARE OF THIS PATIENT: 35 minutes.   POSSIBLE D/C IN 1-2 DAYS, DEPENDING ON CLINICAL CONDITION. May need PT eval and SNF placement once stable from cardio.  Vaughan Basta M.D on 02/08/2016   Between 7am to 6pm - Pager - [phone]  After 6pm go to [email or web address] - password [email or web address] [place] [facility]  Office  [phone]  CC: Primary care physician; [name], MD  Note: This dictation was prepared with Dragon dictation along with smaller phrase technology. Any transcriptional errors that result from this process are unintentional.

## 2016-02-08 NOTE — Progress Notes (Signed)
Per Candace, NT CBG 50 per glucometer. Per Dr. Anselm Jungling okay to give 1 amp of D50. Patient is NPO. Will re-check CBG and continue to monitor.

## 2016-02-08 NOTE — Consult Note (Signed)
[facility] Consultation Note  Patient ID: [name], MRN: [id number], DOB/AGE: [birth year] 65 y.o. Admit date: 02/07/2016   Date of Consult: 02/08/2016 Primary Physician: [name], MD Primary Cardiologist: Bluford Kaufmann  Chief Complaint:  Chief Complaint  Patient presents with  . Hyperglycemia  . Chest Pain  . Nausea   Reason for Consult: non-ST elevation myocardial infarction  HPI: 65 y.o. female with known diabetes with complications essential hypertension mixed hyperlipidemia chronic kidney disease stage IV and previous coronary atherosclerosis status post subendocardial myocardial infarction having new onset of significant anemia likely causing episodes of chest discomfort substernal in nature radiating into the back and also causing shortness of breath. This has been relieved by current medical regimen and packed red blood cells. The patient although has an EKG showing normal sinus rhythm and additionally elevated troponin of 1.8 consistent with non-ST elevation myocardial infarction. She has been stable at this time but there are significant concerns of other complicating issues including bleeding with a positive blood in the stool and concerns of chronic kidney disease stage III as well as some liver enzyme elevation. We have discussed with this current situation that the patient may be able to be medically managed through some of this with correction of her anemia and other medication management including isosorbide  Past Medical History  Diagnosis Date  . DIABETES MELLITUS, II, COMPLICATIONS 99991111  . HYPERLIPIDEMIA 02/27/2007  . 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 ANEMIA, OTHER, UNSPECIFIED 02/27/2007  . DEPRESSIVE DISORDER, NOS 02/27/2007  . HYPERTENSION, BENIGN SYSTEMIC 02/27/2007  . CORONARY, ARTERIOSCLEROSIS 02/27/2007  . TIA 05/17/2009  . GASTROESOPHAGEAL REFLUX, NO ESOPHAGITIS 02/27/2007  . RENAL INSUFFICIENCY, CHRONIC 12/28/2009  . Renal failure,  acute on chronic (HCC) 01/25/2014  . Blood transfusion without reported diagnosis   . Anemia       Surgical History:  Past Surgical History  Procedure Laterality Date  . Partial hysterectomy    . Rectocele repair    . Bladder surgery      Tack     Home Meds: Prior to Admission medications   Medication Sig Start Date End Date Taking? Authorizing Provider  ALPRAZolam Duanne Moron) 1 MG tablet Take 0.5-1 mg by mouth 3 (three) times daily as needed for anxiety.    Yes Historical Provider, MD  aspirin 81 MG chewable tablet Chew 81 mg by mouth at bedtime.   Yes Historical Provider, MD  Cholecalciferol (VITAMIN D3) 5000 units TABS Take 5,000 Units by mouth daily.   Yes Historical Provider, MD  Choline Fenofibrate (FENOFIBRIC ACID) 135 MG CPDR Take 135 mg by mouth daily.   Yes Historical Provider, MD  clopidogrel (PLAVIX) 75 MG tablet Take 75 mg by mouth at bedtime.    Yes Historical Provider, MD  FLUoxetine (PROZAC) 20 MG capsule Take 20 mg by mouth 2 (two) times daily.    Yes Historical Provider, MD  gabapentin (NEURONTIN) 300 MG capsule Take 600 mg by mouth 3 (three) times daily.    Yes Historical Provider, MD  insulin aspart (NOVOLOG) 100 UNIT/ML injection Inject 2-12 Units into the skin 3 (three) times daily with meals as needed for high blood sugar. Pt uses as needed per sliding scale.   Yes Historical Provider, MD  insulin glargine (LANTUS) 100 UNIT/ML injection Inject 12 Units into the skin daily.   Yes Historical Provider, MD  ketoconazole (NIZORAL) 2 % shampoo Apply 1 application topically 2 (two) times a week.   Yes Historical Provider, MD  lisinopril (PRINIVIL,ZESTRIL) 2.5 MG  tablet Take 2.5 mg by mouth daily.   Yes Historical Provider, MD  nitroGLYCERIN (NITROSTAT) 0.4 MG SL tablet Place 0.4 mg under the tongue every 5 (five) minutes as needed for chest pain.    Yes Historical Provider, MD  pantoprazole (PROTONIX) 40 MG tablet Take 1 tablet (40 mg total) by mouth daily. 06/26/15  Yes Theodoro Grist, MD  promethazine (PHENERGAN) 25 MG tablet Take 25 mg by mouth every 6 (six) hours as needed for nausea or vomiting.    Yes Lucille Passy, MD  simvastatin (ZOCOR) 20 MG tablet Take 20 mg by mouth at bedtime.    Yes Historical Provider, MD  topiramate (TOPAMAX) 100 MG tablet Take 100 mg by mouth 2 (two) times daily.    Yes Historical Provider, MD    Inpatient Medications:  . aspirin  81 mg Oral QHS  . cholecalciferol  5,000 Units Oral Daily  . clopidogrel  75 mg Oral QHS  . fenofibrate  145 mg Oral Daily  . FLUoxetine  20 mg Oral BID  . gabapentin  600 mg Oral TID  . Influenza vac split quadrivalent PF  0.5 mL Intramuscular Tomorrow-1000  . insulin aspart  0-9 Units Subcutaneous TID WC  . isosorbide mononitrate  30 mg Oral Daily  . pantoprazole (PROTONIX) IV  40 mg Intravenous Q12H  . pneumococcal 23 valent vaccine  0.5 mL Intramuscular Tomorrow-1000  . simvastatin  20 mg Oral QHS  . sodium chloride flush  3 mL Intravenous Q12H  . topiramate  100 mg Oral BID      Allergies:  Allergies  Allergen Reactions  . Hydralazine Hcl Swelling  . Bisphosphonates Rash  . Codeine Phosphate Nausea And Vomiting and Rash  . Penicillins Itching, Rash and Other (See Comments)    Has patient had a PCN reaction causing immediate rash, facial/tongue/throat swelling, SOB or lightheadedness with hypotension: No Has patient had a PCN reaction causing severe rash involving mucus membranes or skin necrosis: No Has patient had a PCN reaction that required hospitalization No Has patient had a PCN reaction occurring within the last 10 years: No If all of the above answers are "NO", then may proceed with Cephalosporin use.  . Sulfa Antibiotics Itching and Rash    Social History   Social History  . Marital Status: Divorced    Spouse Name: N/A  . Number of Children: 2  . Years of Education: N/A   Occupational History  . Disabled    Social History Main Topics  . Smoking status: Never Smoker    . Smokeless tobacco: Never Used  . Alcohol Use: No  . Drug Use: No  . Sexual Activity: Not on file   Other Topics Concern  . Not on file   Social History Narrative     Family History  Problem Relation Age of Onset  . Cancer Mother     unknown  . CAD Mother   . Cancer Sister     unknown cancer ?breast ca     Review of Systems Positive for chest discomfort bleeding abdominal pain Negative for: General:  chills, fever, night sweats or weight changes.  Cardiovascular: PND orthopnea syncope dizziness  Dermatological skin lesions rashes Respiratory: Cough congestion Urologic: Frequent urination urination at night and hematuria Abdominal: negative for nausea, vomiting, diarrhea,  or hematemesis Neurologic: negative for visual changes, and/or hearing changes  All other systems reviewed and are otherwise negative except as noted above.  Labs:  Recent Labs  02/07/16 1708 02/07/16  2228 02/08/16 0544  TROPONINI 2.72* 1.86* 1.66*   Lab Results  Component Value Date   WBC 5.4 02/08/2016   HGB 9.9* 02/08/2016   HCT 29.9* 02/08/2016   MCV 93.9 02/08/2016   PLT 144* 02/08/2016    Recent Labs Lab 02/08/16 0544  NA 137  K 3.6  CL 109  CO2 22  BUN 59*  CREATININE 2.31*  CALCIUM 8.7*  GLUCOSE 99   Lab Results  Component Value Date   CHOL 97 06/24/2015   HDL 28* 06/24/2015   LDLCALC 52 06/24/2015   TRIG 87 06/24/2015   Lab Results  Component Value Date   DDIMER * 08/30/2009    2.24        AT THE INHOUSE ESTABLISHED CUTOFF VALUE OF 0.48 ug/mL FEU, THIS ASSAY HAS BEEN DOCUMENTED IN THE LITERATURE TO HAVE A SENSITIVITY AND NEGATIVE PREDICTIVE VALUE OF AT LEAST 98 TO 99%.  THE TEST RESULT SHOULD BE CORRELATED WITH AN ASSESSMENT OF THE CLINICAL PROBABILITY OF DVT / VTE.    Radiology/Studies:  Dg Chest 2 View  02/07/2016  CLINICAL DATA:  Hyperglycemia and chest pain 1 day prior EXAM: CHEST  2 VIEW COMPARISON:  June 24, 2015 FINDINGS: There is no edema or  consolidation. The heart size and pulmonary vascularity are normal. No adenopathy. There is a coronary stent in the circumflex region. IMPRESSION: No edema or consolidation. Electronically Signed   By: Lowella Grip III M.D.   On: 02/07/2016 17:44    EKG: Normal sinus rhythm. Normal EKG  Weights: Filed Weights   02/07/16 1705 02/07/16 2307  Weight: 105 lb (47.628 kg) 109 lb 11.2 oz (49.76 kg)     Physical Exam: Blood pressure 104/51, pulse 83, temperature 97.6 F (36.4 C), temperature source Oral, resp. rate 16, height 5\' 3"  (1.6 m), weight 109 lb 11.2 oz (49.76 kg), SpO2 100 %. Body mass index is 19.44 kg/(m^2). General: Well developed, well nourished, in no acute distress. Head eyes ears nose throat: Normocephalic, atraumatic, sclera non-icteric, no xanthomas, nares are without discharge. No apparent thyromegaly and/or mass  Lungs: Normal respiratory effort.  no wheezes, no rales, no rhonchi.  Heart: RRR with normal S1 S2. no murmur gallop, no rub, PMI is normal size and placement, carotid upstroke normal without bruit, jugular venous pressure is normal Abdomen: Soft, non-tender, non-distended with normoactive bowel sounds. No hepatomegaly. No rebound/guarding. No obvious abdominal masses. Abdominal aorta is normal size without bruit Extremities: No edema. no cyanosis, no clubbing, no ulcers  Peripheral : 2+ bilateral upper extremity pulses, 2+ bilateral femoral pulses, 2+ bilateral dorsal pedal pulse Neuro: Alert and oriented. No facial asymmetry. No focal deficit. Moves all extremities spontaneously. Musculoskeletal: Normal muscle tone without kyphosis Psych:  Responds to questions appropriately with a normal affect.    Assessment: 65 year old female with the significant medical abnormalities including diabetes with complications essential hypertension mixed hyperlipidemia anemia with chronic kidney disease having previous coronary atherosclerosis and non-ST elevation myocardial  infarction  Plan: 1. Due to current situation of concerns of the blood in the stool and significant chronic kidney disease stage IV would consider defer of cardiac catheterization at this time 2. Continue high intensity cholesterol therapy with simvastatin to reduce possible cardiovascular event 3. Aspirin and Plavix if able if patient does not have any significant amount of bleeding although would follow for the fact that if she has stability with these medications and she may be able to tolerate stenting and/or cardiac catheterization in the future 4. Isosorbide  for any chest discomfort and coronary artery disease suspected after non-ST elevation myocardial infarction 5. Treatment of anemia and chronic kidney disease which may have exacerbated current situation 6. Begin cardiac rehabilitation and follow for any further significant symptoms and possible stress test to evaluate possible large areas of ischemia which may need treatment. No evidence of significant areas of ischemia occur would continue medication management Signed, Corey Skains M.D. [facility] 02/08/2016, 8:55 AM

## 2016-02-08 NOTE — Plan of Care (Signed)
Problem: Pain Managment: Goal: General experience of comfort will improve Outcome: Progressing Patient did c/o  Cramps located at bilateral extremities, medication administered.

## 2016-02-08 NOTE — Progress Notes (Signed)
Hypoglycemic Event  CBG: 46 and 50  Treatment: 1 amp of D50  Symptoms: patient felt fatigue   Follow-up CBG: Time:1230 CBG Result:189  Possible Reasons for Event: Patient is NPO   Comments/MD notified: MD made aware     Horton Finer

## 2016-02-08 NOTE — Progress Notes (Signed)
Per Dr. Anselm Jungling okay to order Cepacol lozenge for sore throat.

## 2016-02-08 NOTE — Consult Note (Signed)
GI Inpatient Consult Note  Reason for Consult: Dr. Anselm Jungling    Attending Requesting Consult: + guaiac    History of Present Illness: [name] is a 65 y.o. female seen for evaluation of + guaic at the request of Dr. Anselm Jungling. PMHx of HTN, DM, CAD, GERD, CKD.    Admitted for N/V/D & chest pain, on admission found to have Hgb of 8.9 with MCV 97. Per chart review last Hgb was 7.8 in 12/2015.   She is a somewhat difficult historian, vague with details. She reports a 4 year history of intermittent diarrhea, had constipation at baseline. Now having average of 4 BM/day. Denies any alternating constipation. She has had a 4 month history of abdominal pain and post prandial nausea and vomiting. Symptoms occur at least weekly but not daily.  She denies any obvious BRB but does report some dark stools. She has dysphagia to sandwiches. Denies pills or liquids dysphagia. Recalls a Barium Swallow 10 years ago and being told to tuck her chin w/ eating. She had worsening of chronic vomiting prior to admission and is now having odynophagia. She uses phenergan at home as needed. Daughter reports a 15# weight loss over the past few months, unintentional, per daughter has a good appetite. Denies NSAIDS besides baby aspirin.   Last Colonoscopy: 2010 - ENDOSCOPIC IMPRESSION:  1) Ulcer  2) Otherwise normal examination  3) Proctitis  multiple large rectal ulcers 0-10 cm,s/p biopsies, possibly ischemic or due to diarrhea or possible prolapse  Last Endoscopy: unsure of findings 10+ years ago.    Past Medical History:  Past Medical History  Diagnosis Date  . DIABETES MELLITUS, II, COMPLICATIONS 99991111  . HYPERLIPIDEMIA 02/27/2007  . 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 ANEMIA, OTHER, UNSPECIFIED 02/27/2007  . DEPRESSIVE DISORDER, NOS 02/27/2007  . HYPERTENSION, BENIGN SYSTEMIC 02/27/2007  . CORONARY, ARTERIOSCLEROSIS 02/27/2007  . TIA 05/17/2009  . GASTROESOPHAGEAL REFLUX, NO ESOPHAGITIS 02/27/2007   . RENAL INSUFFICIENCY, CHRONIC 12/28/2009  . Renal failure, acute on chronic (HCC) 01/25/2014  . Blood transfusion without reported diagnosis   . Anemia     Problem List: Patient Active Problem List   Diagnosis Date Noted  . Acute MI ([place]) 02/07/2016  . NSTEMI (non-ST elevated myocardial infarction) ([place]) 02/07/2016  . Chronic kidney disease (CKD), stage IV (severe) ([place]) 06/26/2015  . Chest pain with high risk for cardiac etiology 06/24/2015  . Low hemoglobin 02/02/2014  . Abdominal pain, epigastric 02/02/2014  . Black stools 02/02/2014  . Anemia 01/25/2014  . Renal failure, acute on chronic (HCC) 01/25/2014  . MGUS (monoclonal gammopathy of unknown significance) 11/02/2013  . OSTEOARTHROS UNSPEC GEN/LOC OTH SPEC SITES 07/05/2010  . HEAD TRAUMA 05/04/2010  . CELLULITIS AND ABSCESS OF UNSPECIFIED SITE 05/02/2010  . INSOMNIA, CHRONIC 04/06/2010  . HYPERKALEMIA 03/02/2010  . LEG CRAMPS 02/28/2010  . GASTROENTERITIS WITHOUT DEHYDRATION 02/21/2010  . HAIR LOSS 01/31/2010  . PROCTITIS 12/29/2009  . Chronic kidney disease 12/28/2009  . PRURITUS 10/04/2009  . ULCER, RECTUM 09/28/2009  . UNSPECIFIED CONSTIPATION 09/17/2009  . 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 FATIGUE 08/05/2009  . ABDOMINAL PAIN, UNSPECIFIED SITE 08/05/2009  . TIA 05/17/2009  . 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 ANXIETY 06/30/2008  . URGENCY OF URINATION 06/30/2008  . DIABETES MELLITUS, II, COMPLICATIONS [id number]  . HYPERLIPIDEMIA 02/27/2007  . Anemia 02/27/2007  . DEPRESSIVE DISORDER, NOS 02/27/2007  . HYPERTENSION, BENIGN SYSTEMIC 02/27/2007  . CORONARY, ARTERIOSCLEROSIS 02/27/2007  . GASTROESOPHAGEAL REFLUX, NO ESOPHAGITIS 02/27/2007  . BACK PAIN,  LOW 02/27/2007  . FIBROMYALGIA, FIBROMYOSITIS 02/27/2007    Past Surgical History: Past Surgical History  Procedure Laterality Date  . Partial hysterectomy    . Rectocele repair    . Bladder surgery      Tack     Allergies: Allergies  Allergen Reactions  . Hydralazine Hcl Swelling  . Bisphosphonates Rash  . Codeine Phosphate Nausea And Vomiting and Rash  . Penicillins Itching, Rash and Other (See Comments)    Has patient had a PCN reaction causing immediate rash, facial/tongue/throat swelling, SOB or lightheadedness with hypotension: No Has patient had a PCN reaction causing severe rash involving mucus membranes or skin necrosis: No Has patient had a PCN reaction that required hospitalization No Has patient had a PCN reaction occurring within the last 10 years: No If all of the above answers are "NO", then may proceed with Cephalosporin use.  . Sulfa Antibiotics Itching and Rash    Home Medications: Prescriptions prior to admission  Medication Sig Dispense Refill Last Dose  . ALPRAZolam (XANAX) 1 MG tablet Take 0.5-1 mg by mouth 3 (three) times daily as needed for anxiety.    02/07/2016 at Unknown time  . aspirin 81 MG chewable tablet Chew 81 mg by mouth at bedtime.   02/06/2016 at 2200  . Cholecalciferol (VITAMIN D3) 5000 units TABS Take 5,000 Units by mouth daily.   02/07/2016 at Unknown time  . Choline Fenofibrate (FENOFIBRIC ACID) 135 MG CPDR Take 135 mg by mouth daily.   02/07/2016 at Unknown time  . clopidogrel (PLAVIX) 75 MG tablet Take 75 mg by mouth at bedtime.    02/06/2016 at 2200  . FLUoxetine (PROZAC) 20 MG capsule Take 20 mg by mouth 2 (two) times daily.    02/07/2016 at Unknown time  . gabapentin (NEURONTIN) 300 MG capsule Take 600 mg by mouth 3 (three) times daily.    02/07/2016 at Unknown time  . insulin aspart (NOVOLOG) 100 UNIT/ML injection Inject 2-12 Units into the skin 3 (three) times daily with meals as needed for high blood sugar. Pt uses as needed per sliding scale.   02/07/2016 at Unknown time  . insulin glargine (LANTUS) 100 UNIT/ML injection Inject 12 Units into the skin daily.   02/07/2016 at Unknown time  . ketoconazole (NIZORAL) 2 % shampoo Apply 1 application topically 2 (two) times  a week.   Past Month at Unknown time  . lisinopril (PRINIVIL,ZESTRIL) 2.5 MG tablet Take 2.5 mg by mouth daily.   02/07/2016 at Unknown time  . nitroGLYCERIN (NITROSTAT) 0.4 MG SL tablet Place 0.4 mg under the tongue every 5 (five) minutes as needed for chest pain.    Past Month at Unknown time  . pantoprazole (PROTONIX) 40 MG tablet Take 1 tablet (40 mg total) by mouth daily. 30 tablet 6 02/07/2016 at Unknown time  . promethazine (PHENERGAN) 25 MG tablet Take 25 mg by mouth every 6 (six) hours as needed for nausea or vomiting.    Past Week at Unknown time  . simvastatin (ZOCOR) 20 MG tablet Take 20 mg by mouth at bedtime.    02/06/2016 at Unknown time  . topiramate (TOPAMAX) 100 MG tablet Take 100 mg by mouth 2 (two) times daily.    02/07/2016 at Unknown time   Home medication reconciliation was completed with the patient.   Scheduled Inpatient Medications:   . aspirin  81 mg Oral QHS  . cholecalciferol  5,000 Units Oral Daily  . clopidogrel  75 mg Oral QHS  .  fenofibrate  145 mg Oral Daily  . FLUoxetine  20 mg Oral BID  . gabapentin  600 mg Oral TID  . insulin aspart  0-9 Units Subcutaneous TID WC  . isosorbide mononitrate  30 mg Oral Daily  . pantoprazole (PROTONIX) IV  40 mg Intravenous Q12H  . simvastatin  20 mg Oral QHS  . sodium chloride flush  3 mL Intravenous Q12H  . topiramate  100 mg Oral BID    Continuous Inpatient Infusions:     PRN Inpatient Medications:  ALPRAZolam, nitroGLYCERIN, promethazine  Family History: family history includes CAD in her mother; Cancer in her mother and sister.  Denies any GI family history.    Social History:   reports that she has never smoked. She has never used smokeless tobacco. She reports that she does not drink alcohol or use illicit drugs.    Review of Systems: Constitutional: down 15# in 4 months - unintentional Eyes: No changes in vision. ENT: No oral lesions, sore throat.  GI: see HPI.  Heme/Lymph: No easy bruising.  CV: No  chest pain.  GU: No hematuria.  Integumentary: No rashes.  Neuro: No headaches.  Psych: No depression/anxiety.  Endocrine: No heat/cold intolerance.  Allergic/Immunologic: No urticaria.  Resp: No cough, SOB.  Musculoskeletal: No joint swelling.    Physical Examination: BP 141/65 mmHg  Pulse 89  Temp(Src) 97.6 F (36.4 C) (Oral)  Resp 16  Ht 5\' 3"  (1.6 m)  Wt 109 lb 11.2 oz (49.76 kg)  BMI 19.44 kg/m2  SpO2 100% Gen: NAD, alert and oriented x 4 HEENT: PEERLA, EOMI, Neck: supple, no JVD or thyromegaly Chest: CTA bilaterally, no wheezes, crackles, or other adventitious sounds CV: RRR, no m/g/c/r Abd: soft, ND, diffusely TTP without rebound or guarding, normal bowel sounds.  Rectal - brown stool in vault, heme occut negative.  Ext: no edema, well perfused with 2+ pulses, Skin: no rash or lesions noted Lymph: no LAD  Data: Lab Results  Component Value Date   WBC 5.4 02/08/2016   HGB 9.9* 02/08/2016   HCT 29.9* 02/08/2016   MCV 93.9 02/08/2016   PLT 144* 02/08/2016    Recent Labs Lab 02/07/16 1708 02/08/16 0544  HGB 8.9* 9.9*   Lab Results  Component Value Date   NA 137 02/08/2016   K 3.6 02/08/2016   CL 109 02/08/2016   CO2 22 02/08/2016   BUN 59* 02/08/2016   CREATININE 2.31* 02/08/2016   GLU 51* 01/19/2011   Lab Results  Component Value Date   ALT 17 05/04/2014   AST 15 05/04/2014   ALKPHOS 44* 05/04/2014   BILITOT 0.4 05/04/2014   No results for input(s): APTT, INR, PTT in the last 168 hours. Assessment/Plan: Ms. Miele is a 65 y.o. female admitted for chest pain, N/V, diarrhea, and anemia.   Recommendations: 1. 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 Multiple blood transfusions in past. Last ferritin 598 (06/2015) with normal iron and TIBC. Her Hgb is up significantly from 12/2015 when it was 7.8. Occult negative on my exam. Needs EGD/colonoscopy but with active  cardiac issues and elevated troponin would not recommend at this time. Consider in 4-6 weeks as outpatient when more stable from cardiac standpoint.   2. Dysphagia - continue PPI. EGD if/when clinically feasible. Odynophagia from repetitive vomiting, liquid diet today then soft diet tomorrow.   3. Diarrhea - given prior colonoscopy w/ rectal ulcer consider IBD. Chronic diarrhea  makes infectious cause unlikely. Colonoscopy w/ random bx as above when stable.   4. N/V, epigastric pain - symptoms suspicious for ulcer, Continue PPI BID. Nausea/vomiting resolved-has anti-emetics PRN. Continue aspirin per cardiology.   5. Weight loss - unintentional, chest xray without cause. Endoscopic evaluation as above.    *Case disscused w/ Dr. Candace Cruise.   Thank you for the consult. Please call with questions or concerns.  Ronney Asters, PA-C [place]

## 2016-02-08 NOTE — Care Management (Signed)
Patient presents from office of her PCP- Dr Kary Kos.  She had blood sugar of 500.  In the ED noted to have troponin of 2.72.  She has diagnosis of stage 3 kidney disease.  GI consult made due to hgb and concern for gi etiology.  Nephrology and cardiology have consulted

## 2016-02-08 NOTE — Consult Note (Signed)
[facility] CONSULT NOTE    Date: 02/08/2016                  Patient Name:  [name]  MRN: [id number]  DOB: 02/28/1951  Age / Sex: 65 y.o., female         PCP: [name], MD                 Service Requesting Consult: Dr. Marthann Schiller                 Reason for Consult: Acute renal failure, CKD stage III            History of Present Illness: Patient is a 65 y.o. female with a PMHx of diabetes mellitus type 2, hyperlipidemia, MGUS, anemia chronic kidney disease, hypertension, coronary artery disease, GERD, chronic kidney disease stage III, depression, who was admitted to [facility] on 02/07/2016 for evaluation of chest pain and nausea.  It appears that she has a subacute illness.  Over the past several months she has beenbecoming increasingly weaker and fatigued.  2-3 days prior to admission she developed chest pain that had radiation to the neck.  Patient has history of coronary artery disease test post stent placement x2.  She was also having nausea and vomiting at home.  We previously saw her in 2011 for evaluation management of chronic kidney disease stage III.  She was lost to followup after her first visit.  She also has underlying MGUS and continues to follow with Dr. Grayland Ormond for this issue.  She denies ingestion of NSAIDs at home.   Her most recent baseline creatinine appears to be 2.05 with an EGFR of 24.   Medications: Outpatient medications: Prescriptions prior to admission  Medication Sig Dispense Refill Last Dose  . ALPRAZolam (XANAX) 1 MG tablet Take 0.5-1 mg by mouth 3 (three) times daily as needed for anxiety.    02/07/2016 at Unknown time  . aspirin 81 MG chewable tablet Chew 81 mg by mouth at bedtime.   02/06/2016 at 2200  . Cholecalciferol (VITAMIN D3) 5000 units TABS Take 5,000 Units by mouth daily.   02/07/2016 at Unknown time  . Choline Fenofibrate (FENOFIBRIC ACID) 135 MG CPDR Take 135 mg by mouth daily.   02/07/2016 at Unknown time  .  clopidogrel (PLAVIX) 75 MG tablet Take 75 mg by mouth at bedtime.    02/06/2016 at 2200  . FLUoxetine (PROZAC) 20 MG capsule Take 20 mg by mouth 2 (two) times daily.    02/07/2016 at Unknown time  . gabapentin (NEURONTIN) 300 MG capsule Take 600 mg by mouth 3 (three) times daily.    02/07/2016 at Unknown time  . insulin aspart (NOVOLOG) 100 UNIT/ML injection Inject 2-12 Units into the skin 3 (three) times daily with meals as needed for high blood sugar. Pt uses as needed per sliding scale.   02/07/2016 at Unknown time  . insulin glargine (LANTUS) 100 UNIT/ML injection Inject 12 Units into the skin daily.   02/07/2016 at Unknown time  . ketoconazole (NIZORAL) 2 % shampoo Apply 1 application topically 2 (two) times a week.   Past Month at Unknown time  . lisinopril (PRINIVIL,ZESTRIL) 2.5 MG tablet Take 2.5 mg by mouth daily.   02/07/2016 at Unknown time  . nitroGLYCERIN (NITROSTAT) 0.4 MG SL tablet Place 0.4 mg under the tongue every 5 (five) minutes as needed for chest pain.    Past Month at Unknown time  . pantoprazole (PROTONIX) 40  MG tablet Take 1 tablet (40 mg total) by mouth daily. 30 tablet 6 02/07/2016 at Unknown time  . promethazine (PHENERGAN) 25 MG tablet Take 25 mg by mouth every 6 (six) hours as needed for nausea or vomiting.    Past Week at Unknown time  . simvastatin (ZOCOR) 20 MG tablet Take 20 mg by mouth at bedtime.    02/06/2016 at Unknown time  . topiramate (TOPAMAX) 100 MG tablet Take 100 mg by mouth 2 (two) times daily.    02/07/2016 at Unknown time    Current medications: Current Facility-Administered Medications  Medication Dose Route Frequency Provider Last Rate Last Dose  . ALPRAZolam Duanne Moron) tablet 0.5-1 mg  0.5-1 mg Oral TID PRN Vaughan Basta, MD      . aspirin chewable tablet 81 mg  81 mg Oral QHS Vaughan Basta, MD   81 mg at 02/08/16 0005  . cholecalciferol (VITAMIN D) tablet 5,000 Units  5,000 Units Oral Daily Vaughan Basta, MD   5,000 Units at 02/08/16 1000  .  clopidogrel (PLAVIX) tablet 75 mg  75 mg Oral QHS Vaughan Basta, MD   75 mg at 02/08/16 0005  . fenofibrate (TRICOR) tablet 145 mg  145 mg Oral Daily Vaughan Basta, MD   145 mg at 02/08/16 1000  . FLUoxetine (PROZAC) capsule 20 mg  20 mg Oral BID Vaughan Basta, MD   20 mg at 02/08/16 1000  . gabapentin (NEURONTIN) capsule 600 mg  600 mg Oral TID Vaughan Basta, MD   600 mg at 02/08/16 1000  . insulin aspart (novoLOG) injection 0-9 Units  0-9 Units Subcutaneous TID WC Vaughan Basta, MD   0 Units at 02/08/16 0801  . isosorbide mononitrate (IMDUR) 24 hr tablet 30 mg  30 mg Oral Daily Corey Skains, MD   30 mg at 02/08/16 1000  . menthol-cetylpyridinium (CEPACOL) lozenge 3 mg  1 lozenge Oral PRN Vaughan Basta, MD      . nitroGLYCERIN (NITROSTAT) SL tablet 0.4 mg  0.4 mg Sublingual Q5 min PRN Vaughan Basta, MD      . pantoprazole (PROTONIX) injection 40 mg  40 mg Intravenous Q12H Vaughan Basta, MD   40 mg at 02/08/16 1000  . promethazine (PHENERGAN) tablet 25 mg  25 mg Oral Q6H PRN Vaughan Basta, MD      . simvastatin (ZOCOR) tablet 20 mg  20 mg Oral QHS Vaughan Basta, MD   20 mg at 02/08/16 0005  . sodium chloride flush (NS) 0.9 % injection 3 mL  3 mL Intravenous Q12H Vaughan Basta, MD   3 mL at 02/08/16 1004  . topiramate (TOPAMAX) tablet 100 mg  100 mg Oral BID Vaughan Basta, MD   100 mg at 02/08/16 1224      Allergies: Allergies  Allergen Reactions  . Hydralazine Hcl Swelling  . Bisphosphonates Rash  . Codeine Phosphate Nausea And Vomiting and Rash  . Penicillins Itching, Rash and Other (See Comments)    Has patient had a PCN reaction causing immediate rash, facial/tongue/throat swelling, SOB or lightheadedness with hypotension: No Has patient had a PCN reaction causing severe rash involving mucus membranes or skin necrosis: No Has patient had a PCN reaction that required hospitalization No Has  patient had a PCN reaction occurring within the last 10 years: No If all of the above answers are "NO", then may proceed with Cephalosporin use.  . Sulfa Antibiotics Itching and Rash      Past Medical History: Past Medical History  Diagnosis Date  .  DIABETES MELLITUS, II, COMPLICATIONS 06/28/4764  . HYPERLIPIDEMIA 02/27/2007  . 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 ANEMIA, OTHER, UNSPECIFIED 02/27/2007  . DEPRESSIVE DISORDER, NOS 02/27/2007  . HYPERTENSION, BENIGN SYSTEMIC 02/27/2007  . CORONARY, ARTERIOSCLEROSIS 02/27/2007  . TIA 05/17/2009  . GASTROESOPHAGEAL REFLUX, NO ESOPHAGITIS 02/27/2007  . RENAL INSUFFICIENCY, CHRONIC 12/28/2009  . Renal failure, acute on chronic (HCC) 01/25/2014  . Blood transfusion without reported diagnosis   . Anemia      Past Surgical History: Past Surgical History  Procedure Laterality Date  . Partial hysterectomy    . Rectocele repair    . Bladder surgery      Tack     Family History: Family History  Problem Relation Age of Onset  . Cancer Mother     unknown  . CAD Mother   . Cancer Sister     unknown cancer ?breast ca     Social History: Social History   Social History  . Marital Status: Divorced    Spouse Name: N/A  . Number of Children: 2  . Years of Education: N/A   Occupational History  . Disabled    Social History Main Topics  . Smoking status: Never Smoker   . Smokeless tobacco: Never Used  . Alcohol Use: No  . Drug Use: No  . Sexual Activity: Not on file   Other Topics Concern  . Not on file   Social History Narrative     Review of Systems: Review of Systems  Constitutional: Positive for malaise/fatigue. Negative for fever, chills, weight loss and diaphoresis.  HENT: Negative for ear pain, nosebleeds and tinnitus.   Eyes: Negative for blurred vision.  Respiratory: Negative for cough and hemoptysis.   Cardiovascular: Positive for chest pain. Negative for palpitations, orthopnea and claudication.   Gastrointestinal: Positive for nausea and vomiting. Negative for heartburn.  Genitourinary: Negative for dysuria, urgency and frequency.  Musculoskeletal: Negative for myalgias.  Skin: Negative for rash.  Neurological: Negative for dizziness, speech change, focal weakness and headaches.  Endo/Heme/Allergies: Does not bruise/bleed easily.  Psychiatric/Behavioral: Positive for depression. The patient is not nervous/anxious.       Vital Signs: Blood pressure 149/76, pulse 85, temperature 98 F (36.7 C), temperature source Oral, resp. rate 16, height '5\' 3"'  (1.6 m), weight 49.76 kg (109 lb 11.2 oz), SpO2 99 %.  Weight trends: Filed Weights   02/07/16 1705 02/07/16 2307  Weight: 47.628 kg (105 lb) 49.76 kg (109 lb 11.2 oz)    Physical Exam: General: Slender female, NAD  Head: Normocephalic, atraumatic.  Eyes: Anicteric, EOMI  Nose: Mucous membranes moist, not inflammed, nonerythematous.  Throat: Oropharynx nonerythematous, no exudate appreciated.   Neck: Supple, trachea midline.  Lungs:  Normal respiratory effort. Clear to auscultation BL without crackles or wheezes.  Heart: RRR. S1 and S2 normal without gallop, murmur, or rubs.  Abdomen:  BS normoactive. Soft, Nondistended, non-tender.  No masses or organomegaly.  Extremities: No pretibial edema.  Neurologic: A&O X3, Motor strength is 5/5 in the all 4 extremities  Skin: No visible rashes, scars.    Lab results: Basic Metabolic Panel:  Recent Labs Lab 02/07/16 1708 02/08/16 0544  NA 134* 137  K 3.7 3.6  CL 106 109  CO2 18* 22  GLUCOSE 405* 99  BUN 73* 59*  CREATININE 2.91* 2.31*  CALCIUM 9.4 8.7*    Liver Function Tests: No results for input(s): AST, ALT, ALKPHOS, BILITOT, PROT, ALBUMIN in the last 168 hours. No results for input(s): LIPASE, AMYLASE in the  last 168 hours. No results for input(s): AMMONIA in the last 168 hours.  CBC:  Recent Labs Lab 02/07/16 1708 02/08/16 0544  WBC 6.6 5.4  NEUTROABS 5.1   --   HGB 8.9* 9.9*  HCT 26.9* 29.9*  MCV 97.1 93.9  PLT 160 144*    Cardiac Enzymes:  Recent Labs Lab 02/07/16 1708 02/07/16 2228 02/08/16 0544  TROPONINI 2.72* 1.86* 1.66*    BNP: Invalid input(s): POCBNP  CBG:  Recent Labs Lab 02/08/16 0353 02/08/16 0742 02/08/16 1129 02/08/16 1130 02/08/16 1230  GLUCAP 177* 72 50* 21* 189*    Microbiology: Results for orders placed or performed during the hospital encounter of 06/20/15  Urine culture     Status: None   Collection Time: 06/20/15  6:47 PM  Result Value Ref Range Status   Specimen Description URINE, CLEAN CATCH  Final   Special Requests NONE  Final   Culture >=100,000 COLONIES/mL KLEBSIELLA PNEUMONIAE  Final   Report Status 06/23/2015 FINAL  Final   Organism ID, Bacteria KLEBSIELLA PNEUMONIAE  Final      Susceptibility   Klebsiella pneumoniae - MIC*    AMPICILLIN 16 RESISTANT Resistant     CEFAZOLIN <=4 SENSITIVE Sensitive     CEFTRIAXONE <=1 SENSITIVE Sensitive     CIPROFLOXACIN <=0.25 SENSITIVE Sensitive     GENTAMICIN <=1 SENSITIVE Sensitive     IMIPENEM <=0.25 SENSITIVE Sensitive     NITROFURANTOIN <=16 SENSITIVE Sensitive     TRIMETH/SULFA <=20 SENSITIVE Sensitive     CEFOXITIN <=4 SENSITIVE Sensitive     * >=100,000 COLONIES/mL KLEBSIELLA PNEUMONIAE    Coagulation Studies: No results for input(s): LABPROT, INR in the last 72 hours.  Urinalysis:  Recent Labs  02/07/16 1905  COLORURINE YELLOW*  LABSPEC 1.015  PHURINE 5.0  GLUCOSEU >500*  HGBUR 1+*  BILIRUBINUR NEGATIVE  KETONESUR NEGATIVE  PROTEINUR NEGATIVE  NITRITE NEGATIVE  LEUKOCYTESUR 1+*      Imaging: Dg Chest 2 View  02/07/2016  CLINICAL DATA:  Hyperglycemia and chest pain 1 day prior EXAM: CHEST  2 VIEW COMPARISON:  June 24, 2015 FINDINGS: There is no edema or consolidation. The heart size and pulmonary vascularity are normal. No adenopathy. There is a coronary stent in the circumflex region. IMPRESSION: No edema or  consolidation. Electronically Signed   By: Lowella Grip III M.D.   On: 02/07/2016 17:44      Assessment & Plan: Pt is a 65 y.o. female with a PMHx of diabetes mellitus type 2, hyperlipidemia, MGUS, anemia chronic kidney disease, hypertension, coronary artery disease, GERD, chronic kidney disease stage III, depression, who was admitted to [facility] on 02/07/2016 for evaluation of chest pain and nausea.  1.  Acute renal failure/chronic kidney disease stage IV/MGUS.  The patient has known underlying chronic kidney disease.  Most recent outpatient EGFR was 24.  Therefore she has advanced renal dysfunction at baseline.  We last saw her in 2011 and thereafter she was lost to followup.  Current renal insufficiency likely related to nausea and vomiting and poor by mouth intake.  We will start the patient on 0.9 normal saline at 50 cc per hour.  We will also check renal ultrasound.  No urgent need to recheck SPEP as this was recently checked by Dr. Grayland Ormond.  2.  Anemia chronic kidney disease.  Hemoglobin noted as being 9.9.  No urgent indication for Procrit.  Patient is also followed by Dr. Grayland Ormond as an outpatient.  3.  Non-ST elevation myocardial infarction.  Troponins  are trending down.  Currently 1.66.  Cardiology following.

## 2016-02-08 NOTE — Consult Note (Signed)
  Pt seen and examined. Please see J.Bridgnes' notes. Pt with chronic epigastric pain with dysphagia/odynophagia. Also, c/o sore throat. Unfortunately, has acute MI, exacerbated by anemia. Previous hx of PUD. Discussed case with Dr. Nehemiah Massed. No plans for endoscopy at this time. Recommend PPI BID. Would continue ASA/plavix as per cardiology. Transfuse as needed. Start full liquid diet. Throat logenzes prn. If tolerated, then can advance to soft diet by tomorrow. Will consider EGD +/- colonoscopy later as outpatient. Will follow. Thanks.

## 2016-02-08 NOTE — Plan of Care (Signed)
Problem: Education: Goal: Knowledge of [facility] Education information/materials will improve Outcome: Progressing Handouts given to patient and family in relation to non-stemi MI.

## 2016-02-09 LAB — CBC
HCT: 30.2 % — ABNORMAL LOW (ref 35.0–47.0)
HEMOGLOBIN: 10 g/dL — AB (ref 12.0–16.0)
MCH: 31.1 pg (ref 26.0–34.0)
MCHC: 33 g/dL (ref 32.0–36.0)
MCV: 94.1 fL (ref 80.0–100.0)
PLATELETS: 113 10*3/uL — AB (ref 150–440)
RBC: 3.21 MIL/uL — AB (ref 3.80–5.20)
RDW: 15 % — ABNORMAL HIGH (ref 11.5–14.5)
WBC: 4.4 10*3/uL (ref 3.6–11.0)

## 2016-02-09 LAB — BASIC METABOLIC PANEL
Anion gap: 10 (ref 5–15)
BUN: 44 mg/dL — AB (ref 6–20)
CHLORIDE: 109 mmol/L (ref 101–111)
CO2: 20 mmol/L — ABNORMAL LOW (ref 22–32)
CREATININE: 2.02 mg/dL — AB (ref 0.44–1.00)
Calcium: 8.2 mg/dL — ABNORMAL LOW (ref 8.9–10.3)
GFR calc Af Amer: 28 mL/min — ABNORMAL LOW (ref >60)
GFR calc non Af Amer: 25 mL/min — ABNORMAL LOW (ref >60)
GLUCOSE: 391 mg/dL — AB (ref 65–99)
POTASSIUM: 3.9 mmol/L (ref 3.5–5.1)
Sodium: 139 mmol/L (ref 135–145)

## 2016-02-09 LAB — GLUCOSE, CAPILLARY
GLUCOSE-CAPILLARY: 392 mg/dL — AB (ref 65–99)
GLUCOSE-CAPILLARY: 251 mg/dL — AB (ref 65–99)
GLUCOSE-CAPILLARY: 200 mg/dL — AB (ref 65–99)
Glucose-Capillary: 337 mg/dL — ABNORMAL HIGH (ref 65–99)

## 2016-02-09 LAB — TYPE AND SCREEN
ABO/RH(D): O POS
ANTIBODY SCREEN: NEGATIVE
UNIT DIVISION: 0

## 2016-02-09 LAB — PARATHYROID HORMONE, INTACT (NO CA): PTH: 8 pg/mL — AB (ref 15–65)

## 2016-02-09 LAB — VITAMIN D 25 HYDROXY (VIT D DEFICIENCY, FRACTURES): Vit D, 25-Hydroxy: 47.7 ng/mL (ref 30.0–100.0)

## 2016-02-09 MED ORDER — INSULIN ASPART 100 UNIT/ML ~~LOC~~ SOLN
0.0000 [IU] | Freq: Three times a day (TID) | SUBCUTANEOUS | Status: DC
  Administered 2016-02-09: 5 [IU] via SUBCUTANEOUS
  Administered 2016-02-09: 9 [IU] via SUBCUTANEOUS
  Filled 2016-02-09: qty 9
  Filled 2016-02-09: qty 5

## 2016-02-09 MED ORDER — SODIUM CHLORIDE 0.9% FLUSH: 3.0000 mL | INTRAVENOUS | Status: DC | PRN

## 2016-02-09 MED ORDER — INSULIN ASPART 100 UNIT/ML ~~LOC~~ SOLN
0.0000 [IU] | Freq: Every day | SUBCUTANEOUS | Status: DC
  Filled 2016-02-09: qty 25

## 2016-02-09 MED ORDER — GABAPENTIN 300 MG PO CAPS
600.0000 mg | ORAL_CAPSULE | Freq: Every day | ORAL | Status: DC
Start: 2016-02-10 — End: 2016-02-11
  Administered 2016-02-10 – 2016-02-11 (×2): 600 mg via ORAL
  Filled 2016-02-09 (×2): qty 2

## 2016-02-09 MED ORDER — CARVEDILOL 3.125 MG PO TABS
3.1250 mg | ORAL_TABLET | Freq: Two times a day (BID) | ORAL | Status: DC
Start: 2016-02-09 — End: 2016-02-11
  Administered 2016-02-09 – 2016-02-11 (×3): 3.125 mg via ORAL
  Filled 2016-02-09 (×3): qty 1

## 2016-02-09 MED ORDER — ISOSORBIDE MONONITRATE ER 60 MG PO TB24
60.0000 mg | ORAL_TABLET | Freq: Every day | ORAL | Status: DC
  Administered 2016-02-10 – 2016-02-11 (×2): 60 mg via ORAL
  Filled 2016-02-09 (×2): qty 1

## 2016-02-09 NOTE — Care Management Important Message (Signed)
Important Message  Patient Details  Name: ESTALINE FRIEDRICHS MRN: [id number] Date of Birth: [birth year]   Medicare Important Message Given:  Yes    Beau Fanny, RN 02/09/2016, 9:30 AM

## 2016-02-09 NOTE — Progress Notes (Signed)
Dose of gabapentin 600 mg PO TID was changed to 600 mg once daily based on renal function (CrCl 21 mL/min).  Lenis Noon, PharmD Clinical Pharmacist 2:06 PM

## 2016-02-09 NOTE — Evaluation (Addendum)
Physical Therapy Evaluation Patient Details Name: [name] MRN: [id number] DOB: 17-Apr-1951 Today's Date: 02/09/2016   History of Present Illness  65 yo F with recent NSTEMI, GI bleed and hyperglycemia with worsening chest pain 2-3 days prior to coming to ED.  Clinical Impression  Pt presents with deficits of strength, endurance, gait and balance. She is unsteady when ambulating and needs a SPC for stability. Vitals stable and were monitored throughout session and pt had no c/o pain or dizziness. CNA checked blood glucose just before PT session and was 291. Pt's daughter present for session. Pt was oriented x4 but needed minimal cues for the year and name of the hospital. Occasional supervision is recommended after hospital discharge. Pt will benefit from skilled PT services to increase functional I and mobility for safe discharge.     Follow Up Recommendations Home health PT;Supervision - Intermittent;Other (comment) (Medical Alert System)    Equipment Recommendations  Delight;Other (comment) (medical alert system)    Recommendations for Other Services       Precautions / Restrictions Precautions Precautions: Fall Restrictions Weight Bearing Restrictions: No      Mobility  Bed Mobility Overal bed mobility: Independent                Transfers Overall transfer level: Needs assistance Equipment used: None Transfers: Sit to/from Stand;Stand Pivot Transfers Sit to Stand: Min guard Stand pivot transfers: Min guard       General transfer comment: narrow BOS, unsteady, VC for safety  Ambulation/Gait Ambulation/Gait assistance: Min guard Ambulation Distance (Feet): 50 Feet Assistive device: 1 person hand held assist Gait Pattern/deviations: Decreased stride length;Narrow base of support     General Gait Details: unsteady  Stairs            Wheelchair Mobility    Modified Rankin (Stroke Patients Only)       Balance Overall balance assessment:  Independent Sitting-balance support: No upper extremity supported Sitting balance-Leahy Scale: Normal     Standing balance support: No upper extremity supported Standing balance-Leahy Scale: Fair Standing balance comment: unsteady                             Pertinent Vitals/Pain Pain Assessment: No/denies pain    Home Living Family/patient expects to be discharged to:: Private residence Living Arrangements: Alone Available Help at Discharge: Family Type of Home: Apartment Home Access: Level entry     Home Layout: One level Home Equipment: None      Prior Function Level of Independence: Independent               Hand Dominance        Extremity/Trunk Assessment   Upper Extremity Assessment: Generalized weakness           Lower Extremity Assessment: Generalized weakness (B LE strength 4/5)         Communication   Communication: No difficulties  Cognition Arousal/Alertness: Awake/alert Behavior During Therapy: WFL for tasks assessed/performed Overall Cognitive Status: Within Functional Limits for tasks assessed                      General Comments      Exercises Other Exercises Other Exercises: B LE seated therex: LAQ, hip flexion, ankle pumps, hip add squeezes, hip abd with manual resistance x10 each. No c/o pain.       Assessment/Plan    PT Assessment Patient needs continued PT services  PT  Diagnosis Difficulty walking;Generalized weakness   PT Problem List Decreased strength;Decreased activity tolerance;Decreased balance;Decreased mobility  PT Treatment Interventions DME instruction;Gait training;Therapeutic activities;Therapeutic exercise;Balance training;Patient/family education   PT Goals (Current goals can be found in the Care Plan section) Acute Rehab PT Goals Patient Stated Goal: To get up and do things. PT Goal Formulation: With patient Time For Goal Achievement: 02/23/16 Potential to Achieve Goals:  Good Additional Goals Additional Goal #1: Pt will demonstrate good standing balance without UE support for increased functional independence.    Frequency Min 2X/week   Barriers to discharge Decreased caregiver support      Co-evaluation               End of Session Equipment Utilized During Treatment: Gait belt Activity Tolerance: Patient tolerated treatment well Patient left: in chair;with call bell/phone within reach;with chair alarm set;with family/visitor present Nurse Communication: Mobility status         Time: [id number] PT Time Calculation (min) (ACUTE ONLY): 25 min   Charges:   PT Evaluation $PT Eval Moderate Complexity: 1 Procedure PT Treatments $Therapeutic Exercise: 8-22 mins   PT G Codes:        Neoma Laming, PT, DPT  02/09/2016, 4:44 PM [phone]

## 2016-02-09 NOTE — Progress Notes (Signed)
Patient rested quietly for the remainder of the shift. No fever noted during this morning's vitals and no complaints of pain. A&Ox3 and VSS. Nursing staff will continue to monitor. Earleen Reaper, RN

## 2016-02-09 NOTE — Progress Notes (Signed)
Patient's daughter stated she was slightly worried about patient, specifically in that her mental state was not the same and she has spiked a low-grade fever for the first time since she'd been admitted. Notified MD of the situation and orders were placed for PRN tylenol and to collect urine for a UA and culture. UA resulted positive and MD notified. Orders for 1g IV Rocephin daily placed and first dose given. Nursing staff will continue to monitor. Earleen Reaper, RN

## 2016-02-09 NOTE — Progress Notes (Signed)
[facility] Encounter Note  Patient: [name] / Admit Date: 02/07/2016 / Date of Encounter: 02/09/2016, 1:03 PM   Subjective: Patient is hemodynamically stable and improved with medication management changes  Review of Systems: Positive for: Chest pain Negative for: Vision change, hearing change, syncope, dizziness, nausea, vomiting,diarrhea, bloody stool, stomach pain, cough, congestion, diaphoresis, urinary frequency, urinary pain,skin lesions, skin rashes Others previously listed  Objective: Telemetry: Normal sinus rhythm Physical Exam: Blood pressure 148/68, pulse 88, temperature 97.7 F (36.5 C), temperature source Oral, resp. rate 18, height 5\' 3"  (1.6 m), weight 109 lb 11.2 oz (49.76 kg), SpO2 100 %. Body mass index is 19.44 kg/(m^2). General: Well developed, well nourished, in no acute distress. Head: Normocephalic, atraumatic, sclera non-icteric, no xanthomas, nares are without discharge. Neck: No apparent masses Lungs: Normal respirations with few wheezes, no rhonchi, no rales , no crackles   Heart: Regular rate and rhythm, normal S1 S2, 2+ apical murmur, no rub, no gallop, PMI is normal size and placement, carotid upstroke normal without bruit, jugular venous pressure normal Abdomen: Soft, non-tender, non-distended with normoactive bowel sounds. No hepatosplenomegaly. Abdominal aorta is normal size without bruit Extremities: No edema, no clubbing, no cyanosis, no ulcers,  Peripheral: 2+ radial, 2+ femoral, 2+ dorsal pedal pulses Neuro: Alert and oriented. Moves all extremities spontaneously. Psych:  Responds to questions appropriately with a normal affect.   Intake/Output Summary (Last 24 hours) at 02/09/16 1303 Last data filed at 02/09/16 0519  Gross per 24 hour  Intake      0 ml  Output   1300 ml  Net  -1300 ml    Inpatient Medications:  . aspirin  81 mg Oral QHS  . carvedilol  3.125 mg Oral BID WC  . cefTRIAXone (ROCEPHIN)  IV  1 g  Intravenous Q24H  . cholecalciferol  5,000 Units Oral Daily  . clopidogrel  75 mg Oral QHS  . fenofibrate  145 mg Oral Daily  . FLUoxetine  20 mg Oral BID  . gabapentin  600 mg Oral TID  . insulin aspart  0-5 Units Subcutaneous QHS  . insulin aspart  0-9 Units Subcutaneous TID WC  . [START ON 02/10/2016] isosorbide mononitrate  60 mg Oral Daily  . pantoprazole (PROTONIX) IV  40 mg Intravenous Q12H  . simvastatin  20 mg Oral QHS  . sodium chloride flush  3 mL Intravenous Q12H  . topiramate  100 mg Oral BID   Infusions:    Labs:  Recent Labs  02/08/16 0544 02/09/16 0619  NA 137 139  K 3.6 3.9  CL 109 109  CO2 22 20*  GLUCOSE 99 391*  BUN 59* 44*  CREATININE 2.31* 2.02*  CALCIUM 8.7* 8.2*   No results for input(s): AST, ALT, ALKPHOS, BILITOT, PROT, ALBUMIN in the last 72 hours.  Recent Labs  02/07/16 1708 02/08/16 0544 02/09/16 0619  WBC 6.6 5.4 4.4  NEUTROABS 5.1  --   --   HGB 8.9* 9.9* 10.0*  HCT 26.9* 29.9* 30.2*  MCV 97.1 93.9 94.1  PLT 160 144* 113*    Recent Labs  02/07/16 1708 02/07/16 2228 02/08/16 0544  TROPONINI 2.72* 1.86* 1.66*   Invalid input(s): POCBNP No results for input(s): HGBA1C in the last 72 hours.   Weights: Filed Weights   02/07/16 1705 02/07/16 2307  Weight: 105 lb (47.628 kg) 109 lb 11.2 oz (49.76 kg)     Radiology/Studies:  Dg Chest 2 View  02/07/2016  CLINICAL DATA:  Hyperglycemia  and chest pain 1 day prior EXAM: CHEST  2 VIEW COMPARISON:  June 24, 2015 FINDINGS: There is no edema or consolidation. The heart size and pulmonary vascularity are normal. No adenopathy. There is a coronary stent in the circumflex region. IMPRESSION: No edema or consolidation. Electronically Signed   By: Lowella Grip III M.D.   On: 02/07/2016 17:44   US Renal  02/08/2016  CLINICAL DATA:  Acute renal failure EXAM: RENAL / URINARY TRACT ULTRASOUND COMPLETE COMPARISON:  None. FINDINGS: Right Kidney: Length: 9.9 cm. Echogenic renal parenchyma. No  mass or hydronephrosis. Left Kidney: Length: 10.2 cm. Echogenic renal parenchyma. No mass or hydronephrosis. Bladder: Underdistended. IMPRESSION: Echogenic renal parenchyma, suggesting medical renal disease. No hydronephrosis. Electronically Signed   By: Julian Hy M.D.   On: 02/08/2016 17:35     Assessment and Recommendation  65 y.o. female with diabetes and complications chronic kidney disease stage IV mixed hyperlipidemia with acute non-ST elevation myocardial infarction exacerbated likely secondary to hypoxia and an anemia now improved with medication management 1. Continue isosorbide and nitroglycerin as needed for chest discomfort 2. High intensity cholesterol therapy with simvastatin 3. Carvedilol for myocardial infarction heart rate control and blood pressure control with continued angina 4. No further cardiac diagnostics necessary at this time 5. Begin ambulation with rehabilitation which is been discussed today  Signed, Serafina Royals M.D. FACC

## 2016-02-09 NOTE — Progress Notes (Signed)
Inpatient Diabetes Program Recommendations  AACE/ADA: New Consensus Statement on Inpatient Glycemic Control (2015)  Target Ranges:  Prepandial:   less than 140 mg/dL      Peak postprandial:   less than 180 mg/dL (1-2 hours)      Critically ill patients:  140 - 180 mg/dL   Review of Glycemic Control  Results for [name], [name] (MRN [id number]) as of 02/09/2016 08:29  Ref. Range 02/08/2016 11:30 02/08/2016 12:30 02/08/2016 16:11 02/08/2016 21:12 02/09/2016 07:37  Glucose-Capillary Latest Ref Range: 65-99 mg/dL 46 (L) 189 (H) 96 180 (H) 337 (H)    Diabetes history: Noted Outpatient Diabetes medications: Novolog 2-12 units tid with meals, Lantus 12 units qday Current orders for Inpatient glycemic control: Novolog 0-9 units tid with meals  Inpatient Diabetes Program Recommendations: Monitor patient closely for hypoglycemia.  Poor renal function.  Low blood sugar yesterday after receiving Novolog 5 units.    Will follow.  May need to have a more conservation correction scale of insulin perhaps similar to the Novolog hs scale starting with 1 unit when blood sugars 151-200, 201-250= 2 etc.  If she is eating poorly, consider Novolog insulin q 6h.  Gentry Fitz, RN, BA, MHA, CDE Diabetes Coordinator Inpatient Diabetes Program  [phone] (Team Pager) [phone] ([place]) 02/09/2016 8:47 AM

## 2016-02-09 NOTE — Progress Notes (Signed)
[facility] [place] NAME: [name]    MR#:  [id number]  DATE OF BIRTH:  [birth year]  SUBJECTIVE:   She has a bit of discomfort in her chest not pain maybe pressure she says   REVIEW OF SYSTEMS:    Review of Systems  Constitutional: Negative for fever, chills and malaise/fatigue.  HENT: Negative for sore throat.   Eyes: Negative for blurred vision.  Respiratory: Negative for cough, hemoptysis, shortness of breath and wheezing.   Cardiovascular: Positive for chest pain (chest discomfort). Negative for palpitations and leg swelling.  Gastrointestinal: Negative for nausea, vomiting, abdominal pain, diarrhea and blood in stool.  Genitourinary: Negative for dysuria.  Musculoskeletal: Negative for back pain.  Neurological: Negative for dizziness, tremors and headaches.  Endo/Heme/Allergies: Does not bruise/bleed easily.    Tolerating Diet:yes      DRUG ALLERGIES:   Allergies  Allergen Reactions  . Hydralazine Hcl Swelling  . Bisphosphonates Rash  . Codeine Phosphate Nausea And Vomiting and Rash  . Penicillins Itching, Rash and Other (See Comments)    Has patient had a PCN reaction causing immediate rash, facial/tongue/throat swelling, SOB or lightheadedness with hypotension: No Has patient had a PCN reaction causing severe rash involving mucus membranes or skin necrosis: No Has patient had a PCN reaction that required hospitalization No Has patient had a PCN reaction occurring within the last 10 years: No If all of the above answers are "NO", then may proceed with Cephalosporin use.  . Sulfa Antibiotics Itching and Rash    VITALS:  Blood pressure 142/51, pulse 88, temperature 98.1 F (36.7 C), temperature source Oral, resp. rate 18, height 5\' 3"  (1.6 m), weight 49.76 kg (109 lb 11.2 oz), SpO2 98 %.  PHYSICAL EXAMINATION:   Physical Exam  Constitutional: She is oriented to person, place, and time and  well-developed, well-nourished, and in no distress. No distress.  HENT:  Head: Normocephalic.  Eyes: No scleral icterus.  Neck: Normal range of motion. Neck supple. No JVD present. No tracheal deviation present.  Cardiovascular: Normal rate, regular rhythm and normal heart sounds.  Exam reveals no gallop and no friction rub.   No murmur heard. Pulmonary/Chest: Effort normal and breath sounds normal. No respiratory distress. She has no wheezes. She has no rales. She exhibits no tenderness.  Abdominal: Soft. Bowel sounds are normal. She exhibits no distension and no mass. There is no tenderness. There is no rebound and no guarding.  Musculoskeletal: Normal range of motion. She exhibits no edema.  Neurological: She is alert and oriented to person, place, and time.  Skin: Skin is warm. No rash noted. No erythema.  Psychiatric: Affect and judgment normal.      LABORATORY PANEL:   CBC  Recent Labs Lab 02/09/16 0619  WBC 4.4  HGB 10.0*  HCT 30.2*  PLT 113*   ------------------------------------------------------------------------------------------------------------------  Chemistries   Recent Labs Lab 02/09/16 0619  NA 139  K 3.9  CL 109  CO2 20*  GLUCOSE 391*  BUN 44*  CREATININE 2.02*  CALCIUM 8.2*   ------------------------------------------------------------------------------------------------------------------  Cardiac Enzymes  Recent Labs Lab 02/07/16 1708 02/07/16 2228 02/08/16 0544  TROPONINI 2.72* 1.86* 1.66*   ------------------------------------------------------------------------------------------------------------------  RADIOLOGY:  Dg Chest 2 View  02/07/2016  CLINICAL DATA:  Hyperglycemia and chest pain 1 day prior EXAM: CHEST  2 VIEW COMPARISON:  June 24, 2015 FINDINGS: There is no edema or consolidation. The heart size and pulmonary vascularity are normal. No adenopathy. There  is a coronary stent in the circumflex region. IMPRESSION: No edema or  consolidation. Electronically Signed   By: Lowella Grip III M.D.   On: 02/07/2016 17:44   US Renal  02/08/2016  CLINICAL DATA:  Acute renal failure EXAM: RENAL / URINARY TRACT ULTRASOUND COMPLETE COMPARISON:  None. FINDINGS: Right Kidney: Length: 9.9 cm. Echogenic renal parenchyma. No mass or hydronephrosis. Left Kidney: Length: 10.2 cm. Echogenic renal parenchyma. No mass or hydronephrosis. Bladder: Underdistended. IMPRESSION: Echogenic renal parenchyma, suggesting medical renal disease. No hydronephrosis. Electronically Signed   By: Julian Hy M.D.   On: 02/08/2016 17:35     ASSESSMENT AND PLAN:    65 year old female with history of diabetes, hyperlipidemia and chronic kidney disease stage 4 who presented with chest pain about have not STEMI.   1. Non-ST elevation MI with history of 2 stents: Continue aspirin, statin and Plavix. Due to renal issues, medical management is advised. No heparin drip due to initial quite positive stool. Nitroglycerin given this morning for chest pressure. [place] cardiology consultation. Increase isosorbide and start Coreg.   2. Diabetes: Continue sensitive sliding scale insulin.  3.  acute on chronic anemia of chronic kidney disease: Patient is status post 1 unit PRBCs. Hemoglobin remained stable. Continue PPI twice a day as per GI recommendations. Continue aspirin and Plavix due to problem #1. Continue to monitor hemoglobin. EGD plus or minus colonoscopy can be performed as an outpatient.  4. Acute renal failure on chronic kidney disease stage IV/MGUS: Renal ultrasound shows no hydronephrosis but does show medical renal disease. Appreciate nephrology consultation.  Management plans discussed with the patient and she is in agreement.  CODE STATUS: full  TOTAL TIME TAKING CARE OF THIS PATIENT: 29 minutes.     POSSIBLE D/C tomorrow, DEPENDING ON CLINICAL CONDITION.   [name] M.D on 02/09/2016 at 11:10 AM  Between 7am to 6pm -  Pager - [phone] After 6pm go to [email or web address] - password [email or web address] [facility]  Office  [phone]  CC: Primary care physician; Latoya Pink, MD  Note: This dictation was prepared with Dragon dictation along with smaller phrase technology. Any transcriptional errors that result from this process are unintentional.

## 2016-02-09 NOTE — Progress Notes (Signed)
[facility]  ROUNDING NOTE   Subjective:  Patient seen at bedside.  Was having some chest pain.  Cr slightly lower at 2 today.   Objective:  Vital signs in last 24 hours:  Temp:  [97.7 F (36.5 C)-99.4 F (37.4 C)] 97.7 F (36.5 C) (02/09 1134) Pulse Rate:  [82-88] 88 (02/09 1134) Resp:  [16-18] 18 (02/09 0805) BP: (142-155)/(51-83) 148/68 mmHg (02/09 1134) SpO2:  [97 %-100 %] 100 % (02/09 1134)  Weight change:  Filed Weights   02/07/16 1705 02/07/16 2307  Weight: 47.628 kg (105 lb) 49.76 kg (109 lb 11.2 oz)    Intake/Output: I/O last 3 completed shifts: In: 335 [I.V.:10; Blood:325] Out: 1301 [Urine:1300; Stool:1]   Intake/Output this shift:     Physical Exam: General: NAD, slender female  Head: Normocephalic, atraumatic. Moist oral mucosal membranes  Eyes: Anicteric  Neck: Supple, trachea midline  Lungs:  Clear to auscultation normal effort  Heart: Regular rate and rhythm  Abdomen:  Soft, nontender, BS present  Extremities: Trace peripheral edema.  Neurologic: Nonfocal, moving all four extremities  Skin: No lesions       Basic Metabolic Panel:  Recent Labs Lab 02/07/16 1708 02/08/16 0544 02/09/16 0619  NA 134* 137 139  K 3.7 3.6 3.9  CL 106 109 109  CO2 18* 22 20*  GLUCOSE 405* 99 391*  BUN 73* 59* 44*  CREATININE 2.91* 2.31* 2.02*  CALCIUM 9.4 8.7* 8.2*    Liver Function Tests: No results for input(s): AST, ALT, ALKPHOS, BILITOT, PROT, ALBUMIN in the last 168 hours. No results for input(s): LIPASE, AMYLASE in the last 168 hours. No results for input(s): AMMONIA in the last 168 hours.  CBC:  Recent Labs Lab 02/07/16 1708 02/08/16 0544 02/09/16 0619  WBC 6.6 5.4 4.4  NEUTROABS 5.1  --   --   HGB 8.9* 9.9* 10.0*  HCT 26.9* 29.9* 30.2*  MCV 97.1 93.9 94.1  PLT 160 144* 113*    Cardiac Enzymes:  Recent Labs Lab 02/07/16 1708 02/07/16 2228 02/08/16 0544  TROPONINI 2.72* 1.86* 1.66*    BNP: Invalid input(s):  POCBNP  CBG:  Recent Labs Lab 02/08/16 1230 02/08/16 1611 02/08/16 2112 02/09/16 0737 02/09/16 1128  GLUCAP 189* 96 180* 337* 392*    Microbiology: Results for orders placed or performed during the hospital encounter of 06/20/15  Urine culture     Status: None   Collection Time: 06/20/15  6:47 PM  Result Value Ref Range Status   Specimen Description URINE, CLEAN CATCH  Final   Special Requests NONE  Final   Culture >=100,000 COLONIES/mL KLEBSIELLA PNEUMONIAE  Final   Report Status 06/23/2015 FINAL  Final   Organism ID, Bacteria KLEBSIELLA PNEUMONIAE  Final      Susceptibility   Klebsiella pneumoniae - MIC*    AMPICILLIN 16 RESISTANT Resistant     CEFAZOLIN <=4 SENSITIVE Sensitive     CEFTRIAXONE <=1 SENSITIVE Sensitive     CIPROFLOXACIN <=0.25 SENSITIVE Sensitive     GENTAMICIN <=1 SENSITIVE Sensitive     IMIPENEM <=0.25 SENSITIVE Sensitive     NITROFURANTOIN <=16 SENSITIVE Sensitive     TRIMETH/SULFA <=20 SENSITIVE Sensitive     CEFOXITIN <=4 SENSITIVE Sensitive     * >=100,000 COLONIES/mL KLEBSIELLA PNEUMONIAE    Coagulation Studies: No results for input(s): LABPROT, INR in the last 72 hours.  Urinalysis:  Recent Labs  02/07/16 1905 02/08/16 2132  COLORURINE YELLOW* YELLOW*  LABSPEC 1.015 1.014  PHURINE 5.0 5.0  GLUCOSEU >500* 150*  HGBUR 1+* NEGATIVE  BILIRUBINUR NEGATIVE NEGATIVE  KETONESUR NEGATIVE NEGATIVE  PROTEINUR NEGATIVE NEGATIVE  NITRITE NEGATIVE NEGATIVE  LEUKOCYTESUR 1+* TRACE*      Imaging: Dg Chest 2 View  02/07/2016  CLINICAL DATA:  Hyperglycemia and chest pain 1 day prior EXAM: CHEST  2 VIEW COMPARISON:  June 24, 2015 FINDINGS: There is no edema or consolidation. The heart size and pulmonary vascularity are normal. No adenopathy. There is a coronary stent in the circumflex region. IMPRESSION: No edema or consolidation. Electronically Signed   By: Lowella Grip III M.D.   On: 02/07/2016 17:44   US Renal  02/08/2016  CLINICAL DATA:   Acute renal failure EXAM: RENAL / URINARY TRACT ULTRASOUND COMPLETE COMPARISON:  None. FINDINGS: Right Kidney: Length: 9.9 cm. Echogenic renal parenchyma. No mass or hydronephrosis. Left Kidney: Length: 10.2 cm. Echogenic renal parenchyma. No mass or hydronephrosis. Bladder: Underdistended. IMPRESSION: Echogenic renal parenchyma, suggesting medical renal disease. No hydronephrosis. Electronically Signed   By: Julian Hy M.D.   On: 02/08/2016 17:35     Medications:     . aspirin  81 mg Oral QHS  . carvedilol  3.125 mg Oral BID WC  . cefTRIAXone (ROCEPHIN)  IV  1 g Intravenous Q24H  . cholecalciferol  5,000 Units Oral Daily  . clopidogrel  75 mg Oral QHS  . fenofibrate  145 mg Oral Daily  . FLUoxetine  20 mg Oral BID  . [START ON 02/10/2016] gabapentin  600 mg Oral Daily  . insulin aspart  0-5 Units Subcutaneous QHS  . insulin aspart  0-9 Units Subcutaneous TID WC  . [START ON 02/10/2016] isosorbide mononitrate  60 mg Oral Daily  . pantoprazole (PROTONIX) IV  40 mg Intravenous Q12H  . simvastatin  20 mg Oral QHS  . sodium chloride flush  3 mL Intravenous Q12H  . topiramate  100 mg Oral BID   acetaminophen, ALPRAZolam, menthol-cetylpyridinium, nitroGLYCERIN, promethazine, traMADol  Assessment/ Plan:  65 y.o. female  with a PMHx of diabetes mellitus type 2, hyperlipidemia, MGUS, anemia chronic kidney disease, hypertension, coronary artery disease, GERD, chronic kidney disease stage III, depression, who was admitted to [facility] on 02/07/2016 for evaluation of chest pain and nausea.  1. Acute renal failure/chronic kidney disease stage IV/MGUS. The patient has known underlying chronic kidney disease. Most recent outpatient EGFR was 24. Therefore she has advanced renal dysfunction at baseline.  - Renal function slightly improved, continue to monitor renal function daily for now, will need close outpt follow up.  2. Anemia chronic kidney disease. hgb 10.0 at the moment, no urgent  indication for procrit.  3. Non-ST elevation myocardial infarction.  Deemed to be a medical candidate only given renal insufficiency.    LOS: 2 Cy Bresee 2/9/20172:06 PM

## 2016-02-10 LAB — GLUCOSE, CAPILLARY
GLUCOSE-CAPILLARY: 582 mg/dL — AB (ref 65–99)
GLUCOSE-CAPILLARY: 526 mg/dL — AB (ref 65–99)
GLUCOSE-CAPILLARY: 468 mg/dL — AB (ref 65–99)
GLUCOSE-CAPILLARY: 362 mg/dL — AB (ref 65–99)
GLUCOSE-CAPILLARY: 64 mg/dL — AB (ref 65–99)
GLUCOSE-CAPILLARY: 72 mg/dL (ref 65–99)
GLUCOSE-CAPILLARY: 76 mg/dL (ref 65–99)
GLUCOSE-CAPILLARY: 254 mg/dL — AB (ref 65–99)
Glucose-Capillary: 564 mg/dL (ref 65–99)
Glucose-Capillary: 547 mg/dL — ABNORMAL HIGH (ref 65–99)
Glucose-Capillary: 179 mg/dL — ABNORMAL HIGH (ref 65–99)
Glucose-Capillary: 103 mg/dL — ABNORMAL HIGH (ref 65–99)
Glucose-Capillary: 203 mg/dL — ABNORMAL HIGH (ref 65–99)
Glucose-Capillary: 263 mg/dL — ABNORMAL HIGH (ref 65–99)
Glucose-Capillary: 311 mg/dL — ABNORMAL HIGH (ref 65–99)

## 2016-02-10 LAB — BASIC METABOLIC PANEL
Anion gap: 10 (ref 5–15)
BUN: 51 mg/dL — AB (ref 6–20)
CHLORIDE: 101 mmol/L (ref 101–111)
CO2: 19 mmol/L — ABNORMAL LOW (ref 22–32)
Calcium: 8.3 mg/dL — ABNORMAL LOW (ref 8.9–10.3)
Creatinine, Ser: 2.4 mg/dL — ABNORMAL HIGH (ref 0.44–1.00)
GFR calc Af Amer: 23 mL/min — ABNORMAL LOW (ref >60)
GFR calc non Af Amer: 20 mL/min — ABNORMAL LOW (ref >60)
GLUCOSE: 559 mg/dL — AB (ref 65–99)
POTASSIUM: 3.9 mmol/L (ref 3.5–5.1)
Sodium: 130 mmol/L — ABNORMAL LOW (ref 135–145)

## 2016-02-10 LAB — URINE CULTURE

## 2016-02-10 MED ORDER — DEXTROSE 5 % IV SOLN
INTRAVENOUS | Status: DC
  Administered 2016-02-10: 17:00:00 via INTRAVENOUS

## 2016-02-10 MED ORDER — INSULIN ASPART 100 UNIT/ML ~~LOC~~ SOLN
20.0000 [IU] | SUBCUTANEOUS | Status: AC
  Administered 2016-02-10: 20 [IU] via SUBCUTANEOUS
  Filled 2016-02-10: qty 20

## 2016-02-10 MED ORDER — INSULIN ASPART 100 UNIT/ML ~~LOC~~ SOLN
25.0000 [IU] | SUBCUTANEOUS | Status: AC
  Administered 2016-02-10: 25 [IU] via SUBCUTANEOUS

## 2016-02-10 MED ORDER — DEXTROSE 50 % IV SOLN
INTRAVENOUS | Status: AC
Start: 2016-02-10 — End: 2016-02-10
  Administered 2016-02-10: 18:00:00
  Filled 2016-02-10: qty 50

## 2016-02-10 MED ORDER — CARVEDILOL 3.125 MG PO TABS: 3.1250 mg | ORAL_TABLET | Freq: Two times a day (BID) | ORAL | Status: AC

## 2016-02-10 MED ORDER — GLUCAGON HCL (RDNA) 1 MG IJ SOLR
1.0000 mg | Freq: Once | INTRAMUSCULAR | Status: DC | PRN
  Filled 2016-02-10: qty 1

## 2016-02-10 MED ORDER — INSULIN GLARGINE 100 UNIT/ML ~~LOC~~ SOLN
12.0000 [IU] | Freq: Every day | SUBCUTANEOUS | Status: DC
  Administered 2016-02-10: 12 [IU] via SUBCUTANEOUS
  Filled 2016-02-10 (×3): qty 0.12

## 2016-02-10 MED ORDER — PANTOPRAZOLE SODIUM 40 MG PO TBEC: 40.0000 mg | DELAYED_RELEASE_TABLET | Freq: Two times a day (BID) | ORAL | Status: DC

## 2016-02-10 MED ORDER — INSULIN ASPART 100 UNIT/ML ~~LOC~~ SOLN
14.0000 [IU] | SUBCUTANEOUS | Status: AC
  Administered 2016-02-10: 14 [IU] via SUBCUTANEOUS
  Filled 2016-02-10: qty 14

## 2016-02-10 MED ORDER — INSULIN ASPART 100 UNIT/ML ~~LOC~~ SOLN
14.0000 [IU] | Freq: Once | SUBCUTANEOUS | Status: AC
  Administered 2016-02-10: 14 [IU] via SUBCUTANEOUS
  Filled 2016-02-10: qty 14

## 2016-02-10 MED ORDER — DEXTROSE 50 % IV SOLN
1.0000 | Freq: Once | INTRAVENOUS | Status: AC
  Administered 2016-02-10: 50 mL via INTRAVENOUS

## 2016-02-10 MED ORDER — DEXTROSE 10 % IV SOLN
INTRAVENOUS | Status: DC
  Administered 2016-02-10: 18:00:00 via INTRAVENOUS

## 2016-02-10 MED ORDER — GLUCAGON (RDNA) 1 MG IJ KIT: 1.0000 mg | PACK | Freq: Once | INTRAMUSCULAR | Status: DC | PRN

## 2016-02-10 MED ORDER — ISOSORBIDE MONONITRATE ER 60 MG PO TB24: 60.0000 mg | ORAL_TABLET | Freq: Every day | ORAL | Status: AC

## 2016-02-10 NOTE — Progress Notes (Signed)
MD mody was notified that fs 526, 14 units of novolog given

## 2016-02-10 NOTE — Progress Notes (Signed)
MD mody was notified of FS 582, 14 units of novolog

## 2016-02-10 NOTE — Progress Notes (Signed)
MD mody was notified of fs 547, 20 units of novolog ordered

## 2016-02-10 NOTE — Progress Notes (Signed)
MD mody was notified of FS 468, 20 units of novolog ordered. Will monitor

## 2016-02-10 NOTE — Progress Notes (Signed)
FS was rechecked to 254. Will monitor.

## 2016-02-10 NOTE — Progress Notes (Signed)
FS 76, md mody was notified of fs. DM coordinator saw pt.

## 2016-02-10 NOTE — Progress Notes (Signed)
Given 2 apple juice.

## 2016-02-10 NOTE — Progress Notes (Signed)
MD Benjie Karvonen was notified that fs 582, order was to give 14 units of novolog

## 2016-02-10 NOTE — Care Management (Addendum)
Patient for discharge home today and blood sugars were over 500 but now under control with additional insulin.  Patient says they were elevated because she was not getting her lantus here.  Physical therapy recommended home health physical therapy and front wheeled rolling walker.  Agreeable for home health and agency preference is [facility].   [facility] to provide the walker.  Home health referral for SN- cp status, med administration, glycemic control, PT and social work for [employer].  Referral called and accepted by [facility] for weekend visit.  Walker provided by [facility].

## 2016-02-10 NOTE — Discharge Summary (Addendum)
[facility] [place] NAME: [name]    MR#:  [id number]  DATE OF BIRTH:  Jan 17, 1951  DATE OF ADMISSION:  02/07/2016 ADMITTING PHYSICIAN: Vaughan Basta, MD  DATE OF DISCHARGE: 02/10/2016 PRIMARY CARE PHYSICIAN: [name], MD    ADMISSION DIAGNOSIS:  Acute myocarditis, unspecified myocarditis type [I40.9]  DISCHARGE DIAGNOSIS:  Principal Problem:   Acute MI [facility]) Active Problems:   Chronic kidney disease   NSTEMI (non-ST elevated myocardial infarction) ([place])   SECONDARY DIAGNOSIS:   Past Medical History  Diagnosis Date  . DIABETES MELLITUS, II, COMPLICATIONS [id number]  . HYPERLIPIDEMIA 02/27/2007  . 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 ANEMIA, OTHER, UNSPECIFIED 02/27/2007  . DEPRESSIVE DISORDER, NOS 02/27/2007  . HYPERTENSION, BENIGN SYSTEMIC 02/27/2007  . CORONARY, ARTERIOSCLEROSIS 02/27/2007  . TIA 05/17/2009  . GASTROESOPHAGEAL REFLUX, NO ESOPHAGITIS 02/27/2007  . RENAL INSUFFICIENCY, CHRONIC 12/28/2009  . Renal failure, acute on chronic (HCC) 01/25/2014  . Blood transfusion without reported diagnosis   . Anemia     HOSPITAL COURSE:   65 year old female with history of diabetes, hyperlipidemia and chronic kidney disease stage 4 who presented with chest pain about have not STEMI.   1. Non-ST elevation MI with history of 2 stents: Continue aspirin, statin and Plavix. Due to renal issues, medical management was advised. She was not started on heparin drip due to initial guaic positive stool. [place] cardiology consultation. She was started on isosorbide and  Coreg.  She will follow up with cardiology in 1 week.   2. Diabetes: She is brittle diabetes. She will continue her home regimen and have close follow up with her primary care physician. Her blood sugars were elevated on the day of discharge. She was asked not to continue to eat graham crackers.   3. acute on chronic anemia of chronic kidney  disease: Patient is status post 1 unit PRBCs. Hemoglobin remained stable  Continue PPI twice a day as per GI recommendations. Continue aspirin and Plavix due to problem #1.  EGD plus or minus colonoscopy can be performed as an outpatient.  4. Acute renal failure on chronic kidney disease stage IV/MGUS: Renal ultrasound shows no hydronephrosis but does show medical renal disease. Appreciate nephrology consultation   DISCHARGE CONDITIONS AND DIET:   Patient is stable for discharge home on a heart healthy diet/diabetic diet  CONSULTS OBTAINED:  Treatment Team:  Corey Skains, MD Munsoor Holley Raring, MD Isaias Cowman, MD  DRUG ALLERGIES:   Allergies  Allergen Reactions  . Hydralazine Hcl Swelling  . Bisphosphonates Rash  . Codeine Phosphate Nausea And Vomiting and Rash  . Penicillins Itching, Rash and Other (See Comments)    Has patient had a PCN reaction causing immediate rash, facial/tongue/throat swelling, SOB or lightheadedness with hypotension: No Has patient had a PCN reaction causing severe rash involving mucus membranes or skin necrosis: No Has patient had a PCN reaction that required hospitalization No Has patient had a PCN reaction occurring within the last 10 years: No If all of the above answers are "NO", then may proceed with Cephalosporin use.  . Sulfa Antibiotics Itching and Rash    DISCHARGE MEDICATIONS:   Current Discharge Medication List    START taking these medications   Details  carvedilol (COREG) 3.125 MG tablet Take 1 tablet (3.125 mg total) by mouth 2 (two) times daily with a meal. Qty: 60 tablet, Refills: 0    glucagon (GLUCAGON EMERGENCY) 1 MG injection Inject 1 mg into the vein once  as needed. Qty: 1 each, Refills: 12    isosorbide mononitrate (IMDUR) 60 MG 24 hr tablet Take 1 tablet (60 mg total) by mouth daily. Qty: 30 tablet, Refills: 0      CONTINUE these medications which have CHANGED   Details  pantoprazole (PROTONIX) 40 MG tablet  Take 1 tablet (40 mg total) by mouth 2 (two) times daily. Qty: 60 tablet, Refills: 6      CONTINUE these medications which have NOT CHANGED   Details  ALPRAZolam (XANAX) 1 MG tablet Take 0.5-1 mg by mouth 3 (three) times daily as needed for anxiety.     aspirin 81 MG chewable tablet Chew 81 mg by mouth at bedtime.    Cholecalciferol (VITAMIN D3) 5000 units TABS Take 5,000 Units by mouth daily.    Choline Fenofibrate (FENOFIBRIC ACID) 135 MG CPDR Take 135 mg by mouth daily.    clopidogrel (PLAVIX) 75 MG tablet Take 75 mg by mouth at bedtime.     FLUoxetine (PROZAC) 20 MG capsule Take 20 mg by mouth 2 (two) times daily.     gabapentin (NEURONTIN) 300 MG capsule Take 600 mg by mouth 3 (three) times daily.     insulin aspart (NOVOLOG) 100 UNIT/ML injection Inject 2-12 Units into the skin 3 (three) times daily with meals as needed for high blood sugar. Pt uses as needed per sliding scale.    insulin glargine (LANTUS) 100 UNIT/ML injection Inject 12 Units into the skin daily.    ketoconazole (NIZORAL) 2 % shampoo Apply 1 application topically 2 (two) times a week.    nitroGLYCERIN (NITROSTAT) 0.4 MG SL tablet Place 0.4 mg under the tongue every 5 (five) minutes as needed for chest pain.     promethazine (PHENERGAN) 25 MG tablet Take 25 mg by mouth every 6 (six) hours as needed for nausea or vomiting.     simvastatin (ZOCOR) 20 MG tablet Take 20 mg by mouth at bedtime.     topiramate (TOPAMAX) 100 MG tablet Take 100 mg by mouth 2 (two) times daily.       STOP taking these medications     lisinopril (PRINIVIL,ZESTRIL) 2.5 MG tablet               Today   CHIEF COMPLAINT:  Patient is doing well this point. Patient denies chest pain   VITAL SIGNS:  Blood pressure 105/36, pulse 93, temperature 97.8 F (36.6 C), temperature source Oral, resp. rate 18, height 5\' 3"  (1.6 m), weight 49.76 kg (109 lb 11.2 oz), SpO2 99 %.   REVIEW OF SYSTEMS:  Review of Systems   Constitutional: Negative for fever, chills and malaise/fatigue.  HENT: Negative for sore throat.   Eyes: Negative for blurred vision.  Respiratory: Negative for cough, hemoptysis, shortness of breath and wheezing.   Cardiovascular: Negative for chest pain, palpitations and leg swelling.  Gastrointestinal: Negative for nausea, vomiting, abdominal pain, diarrhea and blood in stool.  Genitourinary: Negative for dysuria.  Musculoskeletal: Negative for back pain.  Neurological: Negative for dizziness, tremors and headaches.  Endo/Heme/Allergies: Does not bruise/bleed easily.     PHYSICAL EXAMINATION:  GENERAL:  65 y.o.-year-old patient lying in the bed with no acute distress.  NECK:  Supple, no jugular venous distention. No thyroid enlargement, no tenderness.  LUNGS: Normal breath sounds bilaterally, no wheezing, rales,rhonchi  No use of accessory muscles of respiration.  CARDIOVASCULAR: S1, S2 normal. No murmurs, rubs, or gallops.  ABDOMEN: Soft, non-tender, non-distended. Bowel sounds present. No organomegaly or mass.  EXTREMITIES: No pedal edema, cyanosis, or clubbing.  PSYCHIATRIC: The patient is alert and oriented x 3.  SKIN: No obvious rash, lesion, or ulcer.   DATA REVIEW:   CBC  Recent Labs Lab 02/09/16 0619  WBC 4.4  HGB 10.0*  HCT 30.2*  PLT 113*    Chemistries   Recent Labs Lab 02/10/16 1133  NA 130*  K 3.9  CL 101  CO2 19*  GLUCOSE 559*  BUN 51*  CREATININE 2.40*  CALCIUM 8.3*    Cardiac Enzymes  Recent Labs Lab 02/07/16 1708 02/07/16 2228 02/08/16 0544  TROPONINI 2.72* 1.86* 1.66*    Microbiology Results  @[email or web address] @  RADIOLOGY:  US Renal  02/08/2016  CLINICAL DATA:  Acute renal failure EXAM: RENAL / URINARY TRACT ULTRASOUND COMPLETE COMPARISON:  None. FINDINGS: Right Kidney: Length: 9.9 cm. Echogenic renal parenchyma. No mass or hydronephrosis. Left Kidney: Length: 10.2 cm. Echogenic renal parenchyma. No mass or hydronephrosis. Bladder:  Underdistended. IMPRESSION: Echogenic renal parenchyma, suggesting medical renal disease. No hydronephrosis. Electronically Signed   By: Julian Hy M.D.   On: 02/08/2016 17:35      Management plans discussed with the patient and she is in agreement. Stable for discharge home  Patient should follow up with PCP in 1 week and cardiology in 1 week. GI in 2 weeks.  CODE STATUS:     Code Status Orders        Start     Ordered   02/07/16 2308  Full code   Continuous     02/07/16 2307    Code Status History    Date Active Date Inactive Code Status Order ID Comments User Context   06/23/2015  8:50 PM 06/26/2015  5:10 PM Full Code [id number]  Bettey Costa, MD Inpatient      TOTAL TIME TAKING CARE OF THIS PATIENT: 35 minutes.    Note: This dictation was prepared with Dragon dictation along with smaller phrase technology. Any transcriptional errors that result from this process are unintentional.  Jakarius Flamenco M.D on 02/10/2016 at 2:54 PM  Between 7am to 6pm - Pager - [phone] After 6pm go to [email or web address] - password [email or web address] [facility]  [place] [facility]  Office  [phone]  CC: Primary care physician; [name], MD

## 2016-02-10 NOTE — Progress Notes (Signed)
Inpatient Diabetes Program Recommendations  AACE/ADA: New Consensus Statement on Inpatient Glycemic Control (2015)  Target Ranges:  Prepandial:   less than 140 mg/dL      Peak postprandial:   less than 180 mg/dL (1-2 hours)      Critically ill patients:  140 - 180 mg/dL   Review of Glycemic Control Results for [name], CHAPMON (MRN [id number]) as of 02/10/2016 14:39  Ref. Range 02/10/2016 09:40 02/10/2016 10:28 02/10/2016 11:13 02/10/2016 12:10 02/10/2016 13:00  Glucose-Capillary Latest Ref Range: 65-99 mg/dL 526 (H) 564 (HH) 547 (H) 468 (H) 362 (H)     Diabetes history: Type 2 Outpatient Diabetes medications: Lantus 12 units qam, Novolog 2-12 units tid with meals Current orders for Inpatient glycemic control: Lantus 12 units qday, Novolog 0-9 units tid, Novolog 0-5 units qhs  Inpatient Diabetes Program Recommendations: Spoke with patient and her daughter in law at the bedside.  Patient reports that she takes her meds as ordered 12 units Lantus qam and Novolog 2-12 units tid before her meals and after she checks her blood sugar.  She tells me she keeps her insulin in the fridge. When I asked how much insulin she has at the house currently, she said she has to pick up new insulin at the pharmacy now.  She reports having some low blood sugars but not every week (daughter in law confirms this).  She tells me she keeps apple juice and cran-apple juice at her house at all times and uses it to treat low blood sugars.  Initially when I asked the patient how she treats low blood sugars she said she takes insulin but daughter in law questioned her too and the patient giggled and said "no I don't take insulin for a low sugar".   I spoke to RN regarding my concern about the volume of Novolog insulin patient has received today. I have called Dr. Benjie Karvonen as well and addressed my concerns. I have recommended the patient remain in hospital overnight, q1h blood sugar checks, hypoglycemia protocol for low blood sugars  and Glucagon prescription when patient is discharged home.  I have spoken to the [facility] re: my concerns. I have spoke to [place] who has spoken to Dr. Benjie Karvonen- patient will remain overnight with q1 blood sugar checks.  Latoya Fitz, RN, BA, MHA, CDE Diabetes Coordinator Inpatient Diabetes Program  [phone] (Team Pager) [phone] ([place]) 02/10/2016 3:10 PM

## 2016-02-10 NOTE — Progress Notes (Signed)
Blood sugars were over 500 for a prlonged period of time. She received multiple rounds of insulin and started on her Lantus.  Need to monitor overnight with q1 h accu checks per DM coordinator.  Plan to d/c in am if BS stable  D/w family

## 2016-02-10 NOTE — Progress Notes (Signed)
[facility]  ROUNDING NOTE   Subjective:  Patient seen at bedside. No new creatinine today. Yesterday creatinine was 2.0. Denies having chest pains this a.m.  Objective:  Vital signs in last 24 hours:  Temp:  [97.7 F (36.5 C)-98.8 F (37.1 C)] 98.8 F (37.1 C) (02/10 0427) Pulse Rate:  [76-92] 92 (02/10 0427) Resp:  [16] 16 (02/10 0427) BP: (101-148)/(43-88) 145/49 mmHg (02/10 0427) SpO2:  [98 %-100 %] 98 % (02/10 0427)  Weight change:  Filed Weights   02/07/16 1705 02/07/16 2307  Weight: 47.628 kg (105 lb) 49.76 kg (109 lb 11.2 oz)    Intake/Output: I/O last 3 completed shifts: In: -  Out: 1150 [Urine:1150]   Intake/Output this shift:     Physical Exam: General: NAD, slender female  Head: Normocephalic, atraumatic. Moist oral mucosal membranes  Eyes: Anicteric  Neck: Supple, trachea midline  Lungs:  Clear to auscultation normal effort  Heart: Regular rate and rhythm  Abdomen:  Soft, nontender, BS present  Extremities: Trace peripheral edema.  Neurologic: Nonfocal, moving all four extremities  Skin: No lesions       Basic Metabolic Panel:  Recent Labs Lab 02/07/16 1708 02/08/16 0544 02/09/16 0619  NA 134* 137 139  K 3.7 3.6 3.9  CL 106 109 109  CO2 18* 22 20*  GLUCOSE 405* 99 391*  BUN 73* 59* 44*  CREATININE 2.91* 2.31* 2.02*  CALCIUM 9.4 8.7* 8.2*    Liver Function Tests: No results for input(s): AST, ALT, ALKPHOS, BILITOT, PROT, ALBUMIN in the last 168 hours. No results for input(s): LIPASE, AMYLASE in the last 168 hours. No results for input(s): AMMONIA in the last 168 hours.  CBC:  Recent Labs Lab 02/07/16 1708 02/08/16 0544 02/09/16 0619  WBC 6.6 5.4 4.4  NEUTROABS 5.1  --   --   HGB 8.9* 9.9* 10.0*  HCT 26.9* 29.9* 30.2*  MCV 97.1 93.9 94.1  PLT 160 144* 113*    Cardiac Enzymes:  Recent Labs Lab 02/07/16 1708 02/07/16 2228 02/08/16 0544  TROPONINI 2.72* 1.86* 1.66*    BNP: Invalid input(s):  POCBNP  CBG:  Recent Labs Lab 02/09/16 1602 02/09/16 2027 02/10/16 0729 02/10/16 0940 02/10/16 1028  GLUCAP 251* 200* 582* 526* 564*    Microbiology: Results for orders placed or performed during the hospital encounter of 02/07/16  Urine culture     Status: None   Collection Time: 02/08/16  9:32 PM  Result Value Ref Range Status   Specimen Description URINE, RANDOM  Final   Special Requests NONE  Final   Culture MULTIPLE SPECIES PRESENT, SUGGEST RECOLLECTION  Final   Report Status 02/10/2016 FINAL  Final    Coagulation Studies: No results for input(s): LABPROT, INR in the last 72 hours.  Urinalysis:  Recent Labs  02/07/16 1905 02/08/16 2132  COLORURINE YELLOW* YELLOW*  LABSPEC 1.015 1.014  PHURINE 5.0 5.0  GLUCOSEU >500* 150*  HGBUR 1+* NEGATIVE  BILIRUBINUR NEGATIVE NEGATIVE  KETONESUR NEGATIVE NEGATIVE  PROTEINUR NEGATIVE NEGATIVE  NITRITE NEGATIVE NEGATIVE  LEUKOCYTESUR 1+* TRACE*      Imaging: US Renal  02/08/2016  CLINICAL DATA:  Acute renal failure EXAM: RENAL / URINARY TRACT ULTRASOUND COMPLETE COMPARISON:  None. FINDINGS: Right Kidney: Length: 9.9 cm. Echogenic renal parenchyma. No mass or hydronephrosis. Left Kidney: Length: 10.2 cm. Echogenic renal parenchyma. No mass or hydronephrosis. Bladder: Underdistended. IMPRESSION: Echogenic renal parenchyma, suggesting medical renal disease. No hydronephrosis. Electronically Signed   By: Julian Hy M.D.   On:  02/08/2016 17:35     Medications:     . aspirin  81 mg Oral QHS  . carvedilol  3.125 mg Oral BID WC  . cefTRIAXone (ROCEPHIN)  IV  1 g Intravenous Q24H  . cholecalciferol  5,000 Units Oral Daily  . clopidogrel  75 mg Oral QHS  . fenofibrate  145 mg Oral Daily  . FLUoxetine  20 mg Oral BID  . gabapentin  600 mg Oral Daily  . insulin aspart  0-5 Units Subcutaneous QHS  . insulin aspart  0-9 Units Subcutaneous TID WC  . isosorbide mononitrate  60 mg Oral Daily  . pantoprazole (PROTONIX) IV   40 mg Intravenous Q12H  . simvastatin  20 mg Oral QHS  . topiramate  100 mg Oral BID   acetaminophen, ALPRAZolam, menthol-cetylpyridinium, nitroGLYCERIN, promethazine, sodium chloride flush, traMADol  Assessment/ Plan:  65 y.o. female  with a PMHx of diabetes mellitus type 2, hyperlipidemia, MGUS, anemia chronic kidney disease, hypertension, coronary artery disease, GERD, chronic kidney disease stage III, depression, who was admitted to [facility] on 02/07/2016 for evaluation of chest pain and nausea.  1. Acute renal failure/chronic kidney disease stage IV/MGUS. The patient has known underlying chronic kidney disease. Most recent outpatient EGFR was 24. Therefore she has advanced renal dysfunction at baseline.  - no new renal function testing today.  Recheck renal function today.  Avoid nephrotoxins as possible.  Will need outpatient followup for underlying chronic kidney disease.  2. Anemia chronic kidney disease. hgb 10.0 at the moment, no urgent indication for procrit, ontinue to monitor CBC as an outpatient  3. Non-ST elevation myocardial infarction.  Deemed to be a medical candidate only given renal insufficiency.    LOS: 3 Ronon Ferger 2/10/201711:16 AM

## 2016-02-10 NOTE — Progress Notes (Signed)
[name] at [place] NAME: [name]    MR#:  [id number]  DATE OF BIRTH:  1951/07/28  SUBJECTIVE:  BS high  REVIEW OF SYSTEMS:    Review of Systems  Constitutional: Negative for fever, chills and malaise/fatigue.  HENT: Negative for sore throat.   Eyes: Negative for blurred vision.  Respiratory: Negative for cough, hemoptysis, shortness of breath and wheezing.   Cardiovascular: Negative for chest pain, palpitations and leg swelling.  Gastrointestinal: Negative for nausea, vomiting, abdominal pain, diarrhea and blood in stool.  Genitourinary: Negative for dysuria.  Musculoskeletal: Negative for back pain.  Neurological: Negative for dizziness, tremors and headaches.  Endo/Heme/Allergies: Does not bruise/bleed easily.    Tolerating Diet:yes      DRUG ALLERGIES:   Allergies  Allergen Reactions  . Hydralazine Hcl Swelling  . Bisphosphonates Rash  . Codeine Phosphate Nausea And Vomiting and Rash  . Penicillins Itching, Rash and Other (See Comments)    Has patient had a PCN reaction causing immediate rash, facial/tongue/throat swelling, SOB or lightheadedness with hypotension: No Has patient had a PCN reaction causing severe rash involving mucus membranes or skin necrosis: No Has patient had a PCN reaction that required hospitalization No Has patient had a PCN reaction occurring within the last 10 years: No If all of the above answers are "NO", then may proceed with Cephalosporin use.  . Sulfa Antibiotics Itching and Rash    VITALS:  Blood pressure 105/36, pulse 93, temperature 97.8 F (36.6 C), temperature source Oral, resp. rate 18, height 5\' 3"  (1.6 m), weight 49.76 kg (109 lb 11.2 oz), SpO2 99 %.  PHYSICAL EXAMINATION:   Physical Exam  Constitutional: She is oriented to person, place, and time and well-developed, well-nourished, and in no distress. No distress.  HENT:  Head: Normocephalic.  Eyes: No  scleral icterus.  Neck: Normal range of motion. Neck supple. No JVD present. No tracheal deviation present.  Cardiovascular: Normal rate, regular rhythm and normal heart sounds.  Exam reveals no gallop and no friction rub.   No murmur heard. Pulmonary/Chest: Effort normal and breath sounds normal. No respiratory distress. She has no wheezes. She has no rales. She exhibits no tenderness.  Abdominal: Soft. Bowel sounds are normal. She exhibits no distension and no mass. There is no tenderness. There is no rebound and no guarding.  Musculoskeletal: Normal range of motion. She exhibits no edema.  Neurological: She is alert and oriented to person, place, and time.  Skin: Skin is warm. No rash noted. No erythema.  Psychiatric: Affect and judgment normal.      LABORATORY PANEL:   CBC  Recent Labs Lab 02/09/16 0619  WBC 4.4  HGB 10.0*  HCT 30.2*  PLT 113*   ------------------------------------------------------------------------------------------------------------------  Chemistries   Recent Labs Lab 02/10/16 1133  NA 130*  K 3.9  CL 101  CO2 19*  GLUCOSE 559*  BUN 51*  CREATININE 2.40*  CALCIUM 8.3*   ------------------------------------------------------------------------------------------------------------------  Cardiac Enzymes  Recent Labs Lab 02/07/16 1708 02/07/16 2228 02/08/16 0544  TROPONINI 2.72* 1.86* 1.66*   ------------------------------------------------------------------------------------------------------------------  RADIOLOGY:  US Renal  02/08/2016  CLINICAL DATA:  Acute renal failure EXAM: RENAL / URINARY TRACT ULTRASOUND COMPLETE COMPARISON:  None. FINDINGS: Right Kidney: Length: 9.9 cm. Echogenic renal parenchyma. No mass or hydronephrosis. Left Kidney: Length: 10.2 cm. Echogenic renal parenchyma. No mass or hydronephrosis. Bladder: Underdistended. IMPRESSION: Echogenic renal parenchyma, suggesting medical renal disease. No hydronephrosis.  Electronically Signed   By:  Julian Hy M.D.   On: 02/08/2016 17:35     ASSESSMENT AND PLAN:   65 year old female with history of diabetes, hyperlipidemia and chronic kidney disease stage 4 who presented with chest pain about have not STEMI.   1. Non-ST elevation MI with history of 2 stents: Continue aspirin, statin and Plavix. Due to renal issues, medical management was advised. She was not started on heparin drip due to initial guaic positive stool. [place] cardiology consultation. She was started on isosorbide and Coreg.  She will follow up with cardiology in 1 week.   2. Diabetes: She is brittle diabetes.Her blood sugars have been elevated. She has received multiple rounds of insulin. She will need to be monitored overnight with q1 h accuchecks.  3. acute on chronic anemia of chronic kidney disease: Patient is status post 1 unit PRBCs. Hemoglobin remained stable  Continue PPI twice a day as per GI recommendations. Continue aspirin and Plavix due to problem #1.  EGD plus or minus colonoscopy can be performed as an outpatient.  4. Acute renal failure on chronic kidney disease stage IV/MGUS: Renal ultrasound shows no hydronephrosis but does show medical renal disease. Appreciate nephrology consultation      Management plans discussed with the patient and she is in agreement.  CODE STATUS: FULL  TOTAL TIME TAKING CARE OF THIS PATIENT: 30 minutes.     POSSIBLE D/C tomorrow, DEPENDING ON CLINICAL CONDITION.   [name] M.D on 02/10/2016 at 2:50 PM  Between 7am to 6pm - Pager - [phone] After 6pm go to [email or web address] - password [email or web address] [place] [facility]  Office  [phone]  CC: Primary care physician; Latoya Pink, MD  Note: This dictation was prepared with Dragon dictation along with smaller phrase technology. Any transcriptional errors that result from this process are unintentional.

## 2016-02-10 NOTE — Care Management (Signed)
street address"  [address], [address], [place]

## 2016-02-10 NOTE — Progress Notes (Signed)
MD Benjie Karvonen was notified of FS 21, Naoma Diener thinks she needs stepdown. D10 @100  and amp 50 given. Will continue to monitor.

## 2016-02-11 LAB — GLUCOSE, CAPILLARY
GLUCOSE-CAPILLARY: 369 mg/dL — AB (ref 65–99)
GLUCOSE-CAPILLARY: 423 mg/dL — AB (ref 65–99)
GLUCOSE-CAPILLARY: 325 mg/dL — AB (ref 65–99)
Glucose-Capillary: 252 mg/dL — ABNORMAL HIGH (ref 65–99)
Glucose-Capillary: 298 mg/dL — ABNORMAL HIGH (ref 65–99)
Glucose-Capillary: 304 mg/dL — ABNORMAL HIGH (ref 65–99)
Glucose-Capillary: 356 mg/dL — ABNORMAL HIGH (ref 65–99)
Glucose-Capillary: 382 mg/dL — ABNORMAL HIGH (ref 65–99)

## 2016-02-11 MED ORDER — GLUCAGON (RDNA) 1 MG IJ KIT: 1.0000 mg | PACK | Freq: Once | INTRAMUSCULAR | Status: DC | PRN

## 2016-02-11 NOTE — Progress Notes (Signed)
Pt CBG level over 400. MD notified to clarify orders given to daytime RN. Acknowledged. Ordered to stop fluids and allow patient blood sugar to decrease on its own. Will continue to monitor.

## 2016-02-11 NOTE — Progress Notes (Signed)
[facility]  ROUNDING NOTE   Subjective:  Renal function worse this a.m. Creatinine up to 2.4. Elevated blood sugars noted yesterday after initial episode of hypoglycemia.  Objective:  Vital signs in last 24 hours:  Temp:  [97.8 F (36.6 C)-98.1 F (36.7 C)] 98.1 F (36.7 C) (02/11 0402) Pulse Rate:  [78-93] 78 (02/11 0800) Resp:  [16-18] 16 (02/11 0402) BP: (105-143)/(36-65) 143/64 mmHg (02/11 0800) SpO2:  [99 %-100 %] 100 % (02/11 0800)  Weight change:  Filed Weights   02/07/16 1705 02/07/16 2307  Weight: 47.628 kg (105 lb) 49.76 kg (109 lb 11.2 oz)    Intake/Output: I/O last 3 completed shifts: In: 240 [P.O.:240] Out: 750 [Urine:750]   Intake/Output this shift:  Total I/O In: 120 [P.O.:120] Out: -   Physical Exam: General: NAD, slender female  Head: Normocephalic, atraumatic. Moist oral mucosal membranes  Eyes: Anicteric  Neck: Supple, trachea midline  Lungs:  Clear to auscultation normal effort  Heart: Regular rate and rhythm  Abdomen:  Soft, nontender, BS present  Extremities: Trace peripheral edema.  Neurologic: Nonfocal, moving all four extremities  Skin: No lesions       Basic Metabolic Panel:  Recent Labs Lab 02/07/16 1708 02/08/16 0544 02/09/16 0619 02/10/16 1133  NA 134* 137 139 130*  K 3.7 3.6 3.9 3.9  CL 106 109 109 101  CO2 18* 22 20* 19*  GLUCOSE 405* 99 391* 559*  BUN 73* 59* 44* 51*  CREATININE 2.91* 2.31* 2.02* 2.40*  CALCIUM 9.4 8.7* 8.2* 8.3*    Liver Function Tests: No results for input(s): AST, ALT, ALKPHOS, BILITOT, PROT, ALBUMIN in the last 168 hours. No results for input(s): LIPASE, AMYLASE in the last 168 hours. No results for input(s): AMMONIA in the last 168 hours.  CBC:  Recent Labs Lab 02/07/16 1708 02/08/16 0544 02/09/16 0619  WBC 6.6 5.4 4.4  NEUTROABS 5.1  --   --   HGB 8.9* 9.9* 10.0*  HCT 26.9* 29.9* 30.2*  MCV 97.1 93.9 94.1  PLT 160 144* 113*    Cardiac Enzymes:  Recent  Labs Lab 02/07/16 1708 02/07/16 2228 02/08/16 0544  TROPONINI 2.72* 1.86* 1.66*    BNP: Invalid input(s): POCBNP  CBG:  Recent Labs Lab 02/11/16 0237 02/11/16 0359 02/11/16 0538 02/11/16 0703 02/11/16 0804  GLUCAP 356* 369* 423* 382* 325*    Microbiology: Results for orders placed or performed during the hospital encounter of 02/07/16  Urine culture     Status: None   Collection Time: 02/08/16  9:32 PM  Result Value Ref Range Status   Specimen Description URINE, RANDOM  Final   Special Requests NONE  Final   Culture MULTIPLE SPECIES PRESENT, SUGGEST RECOLLECTION  Final   Report Status 02/10/2016 FINAL  Final    Coagulation Studies: No results for input(s): LABPROT, INR in the last 72 hours.  Urinalysis:  Recent Labs  02/08/16 2132  COLORURINE YELLOW*  LABSPEC 1.014  PHURINE 5.0  GLUCOSEU 150*  HGBUR NEGATIVE  BILIRUBINUR NEGATIVE  KETONESUR NEGATIVE  PROTEINUR NEGATIVE  NITRITE NEGATIVE  LEUKOCYTESUR TRACE*      Imaging: No results found.   Medications:     . aspirin  81 mg Oral QHS  . carvedilol  3.125 mg Oral BID WC  . cefTRIAXone (ROCEPHIN)  IV  1 g Intravenous Q24H  . cholecalciferol  5,000 Units Oral Daily  . clopidogrel  75 mg Oral QHS  . fenofibrate  145 mg Oral Daily  . FLUoxetine  20 mg Oral BID  . gabapentin  600 mg Oral Daily  . insulin aspart  0-5 Units Subcutaneous QHS  . insulin aspart  0-9 Units Subcutaneous TID WC  . insulin glargine  12 Units Subcutaneous Daily  . isosorbide mononitrate  60 mg Oral Daily  . pantoprazole (PROTONIX) IV  40 mg Intravenous Q12H  . simvastatin  20 mg Oral QHS  . topiramate  100 mg Oral BID   acetaminophen, ALPRAZolam, glucagon, menthol-cetylpyridinium, nitroGLYCERIN, promethazine, sodium chloride flush, traMADol  Assessment/ Plan:  65 y.o. female  with a PMHx of diabetes mellitus type 2, hyperlipidemia, MGUS, anemia chronic kidney disease, hypertension, coronary artery disease, GERD, chronic  kidney disease stage III, depression, who was admitted to [facility] on 02/07/2016 for evaluation of chest pain and nausea.  1. Acute renal failure/chronic kidney disease stage IV/MGUS. The patient has known underlying chronic kidney disease. Most recent outpatient EGFR was 24. Therefore she has advanced renal dysfunction at baseline.  - Renal function worse today and could potentially be related to hyperglycemia and subsequent polyuria. Continue to monitor renal function closely. She will need outpatient follow-up.  2. Anemia chronic kidney disease. Last hemoglobin was 10.0. No urgent indication for Procrit at the moment.   3. Non-ST elevation myocardial infarction.  Deemed to be a medical candidate only given renal insufficiency.    LOS: 4 Jenny Lai 2/11/201710:42 AM

## 2016-02-11 NOTE — Discharge Instructions (Signed)
Dialysis Diet Dialysis is a treatment that you may undergo if you have significant damage to your kidneys. Dialysis replaces some of the work that the kidneys do. One of the jobs that it takes over is removing wastes, salt, and extra water from your blood. This helps to keep the amount of potassium and other nutrients in your blood at healthy levels. When you need dialysis, it is important to pay careful attention to your diet. Between dialysis sessions, certain nutrients can build up in your blood and cause you to get sick. Vitamins and minerals are an important part of a healthy diet and should not be avoided entirely. However, it is commonly recommended that you limit your intake of potassium, phosphorus, and sodium. It may also be necessary to restrict other nutrients, such as carbohydrates or fat, if you have other health conditions. Your health care provider or dietitian can help you to determine the amount of these nutrients that is right for you. WHAT IS MY PLAN? Your dietitian will help you to design a meal plan that is specific to your needs. Generally, meal plans include:  Grains, 6-11 servings per day. One serving is equal to 1 slice of bread or  cup of cooked rice or pasta.  Low-potassium vegetables, 2-3 servings per day. One serving is equal to  cup.  Low-potassium fruits, 2-3 servings per day. One serving is equal to  cup.  Meats and other protein sources, 8-11 oz per day.  Dairy,  cup per day. Your dietitian will provide you with specific instructions about the amount of fluids you can have each day. WHAT DO I NEED TO KNOW ABOUT A DIALYSIS DIET?  Limit your intake of potassium. Potassium is found in milk, fruits, and vegetables.  Limit your intake of phosphorus. Phosphorus is found in milk, cheese, beans, nuts, and carbonated beverages. Avoid whole-grain and high-fiber foods because they contain high amounts of phosphorus.  Limit your intake of sodium. Foods that are high in  sodium include processed and cured meats, ready-made frozen meals, canned vegetables, and salty snack foods. Do not use salt substitutes because they contain potassium.  If you were instructed to restrict your fluid intake, follow your health care provider's specific instructions. You may be told to:  Write down what you drink and any foods you eat that are made mostly from water, such as gelatin and soups.  Drink from small cups to help control how much you drink.  Ask your health care provider if you should regularly take an over-the-counter medicine that binds phosphorus, such as antacid products that contain calcium carbonate.  Take vitamin and mineral supplements only as directed by your health care provider.  Eat high-quality proteins, such as meat, poultry, fish, and eggs. Limit low-quality plant-based proteins, such as nuts and beans.  Cut potatoes into small pieces and boil them in unsalted water before you eat them. This can help to remove some potassium from the potato.  Drain all fluid from cooked vegetables and canned fruits before eating them. WHAT FOODS CAN I EAT? Grains White bread. White rice. Cooked cereal. Unsalted popcorn. Tortillas. Pasta. Vegetables Fresh or frozen broccoli, carrots, and green beans. Cabbage. Cauliflower. Celery. Cucumbers. Eggplant. Radishes. Zucchini. Fruits Apples. Fresh or frozen berries. Fresh or canned pears, peaches, and pineapple. Grapes. Plums. Meats and Other Protein Sources Fresh or frozen beef, pork, chicken, and fish. Eggs. Low-sodium canned tuna or salmon. Dairy Cream cheese. Heavy cream. Ricotta cheese. Beverages Apple cider. Cranberry juice. Grape juice. Lemonade.  Black coffee. Condiments Herbs. Spices. Jam and jelly. Honey. Sweets and Desserts Sherbet. Cakes. Cookies. Fats and Oils Olive oil, canola oil, and safflower oil. Other Non-dairy creamer. Non-dairy whipped topping. Homemade broth without salt. The items listed  above may not be a complete list of recommended foods or beverages. Contact your dietitian for more options.  WHAT FOODS ARE NOT RECOMMENDED? Grains Whole-grain bread. Whole-grain pasta. High-fiber cereal. Vegetables Potatoes. Beets. Tomatoes. Winter squash and pumpkin. Asparagus. Spinach. Parsnips. Fruits Star fruit. Bananas. Oranges. Kiwi. Nectarines. Prunes. Melon. Dried fruit. Avocado. Meats and Other Protein Sources Canned, smoked, and cured meats. Soil scientist. Sardines. Nuts and seeds. Peanut butter. Beans and legumes. Dairy Milk. Buttermilk. Yogurt. Cheese and cottage cheese. Processed cheese spreads. Beverages Orange juice. Prune juice. Carbonated soft drinks. Condiments Salt. Salt substitutes. Soy sauce. Sweets and Desserts Ice cream. Chocolate. Candied nuts. Fats and Oils Butter. Margarine. Other Ready-made frozen meals. Canned soups. The items listed above may not be a complete list of foods and beverages to avoid. Contact your dietitian for more information.   This information is not intended to replace advice given to you by your health care provider. Make sure you discuss any questions you have with your health care provider.   Document Released: 09/13/2004 Document Revised: 01/07/2015 Document Reviewed: 07/20/2014 Elsevier Interactive Patient Education 2016 Else[employer]. Hyperglycemia High blood sugar (hyperglycemia) means that the level of sugar in your blood is higher than it should be. Signs of high blood sugar include: Feeling thirsty. Frequent peeing (urinating). Feeling tired or sleepy. Dry mouth. Vision changes. Feeling weak. Feeling hungry but losing weight. Numbness and tingling in your hands or feet. Headache. When you ignore these signs, your blood sugar may keep going up. These problems may get worse, and other problems may begin. HOME CARE Check your blood sugars as told by your doctor. Write down the numbers with the date and  time. Take the right amount of insulin or diabetes pills at the right time. Write down the dose with date and time. Refill your insulin or diabetes pills before running out. Watch what you eat. Follow your meal plan. Drink liquids without sugar, such as water. Check with your doctor if you have kidney or heart disease. Follow your doctor's orders for exercise. Exercise at the same time of day. Keep your doctor's appointments. GET HELP RIGHT AWAY IF:  You have trouble thinking or are confused. You have fast breathing with fruity smelling breath. You pass out (faint). You have 2 to 3 days of high blood sugars and you do not know why. You have chest pain. You are feeling sick to your stomach (nauseous) or throwing up (vomiting). You have sudden vision changes. MAKE SURE YOU:  Understand these instructions. Will watch your condition. Will get help right away if you are not doing well or get worse.   This information is not intended to replace advice given to you by your health care provider. Make sure you discuss any questions you have with your health care provider.   Document Released: 10/14/2009 Document Revised: 01/07/2015 Document Reviewed: 08/23/2015 Elsevier Interactive Patient Education 2016 [employer] [place]. Hypoglycemia Low blood sugar (hypoglycemia) means that the level of sugar in your blood is lower than it should be. Signs of low blood sugar include:  Getting sweaty.  Feeling hungry.  Feeling dizzy or weak.  Feeling sleepier than normal.  Feeling nervous.  Headaches.  Having a fast heartbeat. Low blood sugar can happen fast and can be an emergency.  Your doctor can do tests to check your blood sugar level. You can have low blood sugar and not have diabetes. HOME CARE  Check your blood sugar as told by your doctor. If it is less than 70 mg/dl or as told by your doctor, take 1 of the following:  3 to 4 glucose tablets.   cup clear juice.   cup soda pop, not  diet.  1 cup milk.  5 to 6 hard candies.  Recheck blood sugar after 15 minutes. Repeat until it is at the right level.  Eat a snack if it is more than 1 hour until the next meal.  Only take medicine as told by your doctor.  Do not skip meals. Eat on time.  Do not drink alcohol except with meals.  Check your blood glucose before driving.  Check your blood glucose before and after exercise.  Always carry treatment with you, such as glucose pills.  Always wear a medical alert bracelet if you have diabetes. GET HELP RIGHT AWAY IF:   Your blood glucose goes below 70 mg/dl or as told by your doctor, and you:  Are confused.  Are not able to swallow.  Pass out (faint).  You cannot treat yourself. You may need someone to help you.  You have low blood sugar problems often.  You have problems from your medicines.  You are not feeling better after 3 to 4 days.  You have vision changes. MAKE SURE YOU:   Understand these instructions.  Will watch this condition.  Will get help right away if you are not doing well or get worse.   This information is not intended to replace advice given to you by your health care provider. Make sure you discuss any questions you have with your health care provider.   Document Released: 03/13/2010 Document Revised: 01/07/2015 Document Reviewed: 08/23/2015 Elsevier Interactive Patient Education [employer].

## 2016-02-11 NOTE — Care Management Note (Signed)
Case Management Note  Patient Details  Name: GISELL NUERNBERGER MRN: [id number] Date of Birth: March 02, 1951  Subjective/Objective:         Call to Bethena Roys at [facility] to update her of Ms Goda's discharge home today. Ms Volkmer has a rolling walker delivered earlier this week by [facility]. Alvis Lemmings has a home health referral for RN, PT, SW.            Action/Plan:   Expected Discharge Date:                  Expected Discharge Plan:     In-House Referral:     Discharge planning Services     Post Acute Care Choice:    Choice offered to:     DME Arranged:    DME Agency:     HH Arranged:    [place] Agency:     Status of Service:     Medicare Important Message Given:  Yes Date Medicare IM Given:    Medicare IM give by:    Date Additional Medicare IM Given:    Additional Medicare Important Message give by:     If discussed at [place] of Stay Meetings, dates discussed:    Additional Comments:  Leeana Creer A, RN 02/11/2016, 1:40 PM

## 2016-02-11 NOTE — Progress Notes (Signed)
Patient discharged via wheelchair and private vehicle. IV removed and catheter intact. All discharge instructions given and patient verbalizes understanding. Patient stated she will check her blood sugar and administer insulin when she gets home.Tele removed and returned. No prescriptions given to patient No distress noted.

## 2016-02-11 NOTE — Progress Notes (Signed)
Spoke with Dr. Posey Pronto regarding patients insulin and episode yesterday. Dr. Posey Pronto stated to hold insulin this morning.

## 2016-02-24 ENCOUNTER — Encounter: Payer: Self-pay | Admitting: Emergency Medicine

## 2016-02-24 ENCOUNTER — Emergency Department
Admission: EM | Admit: 2016-02-24 | Discharge: 2016-02-24 | Disposition: A | Discharge status: Home / Self Care | Payer: Commercial Managed Care - HMO | Attending: Student | Admitting: Student

## 2016-02-24 DIAGNOSIS — Z880 Allergy status to penicillin: Secondary | ICD-10-CM | POA: Insufficient documentation

## 2016-02-24 DIAGNOSIS — Y9389 Activity, other specified: Secondary | ICD-10-CM | POA: Insufficient documentation

## 2016-02-24 DIAGNOSIS — T383X1A Poisoning by insulin and oral hypoglycemic [antidiabetic] drugs, accidental (unintentional), initial encounter: Secondary | ICD-10-CM | POA: Diagnosis not present

## 2016-02-24 DIAGNOSIS — Z79899 Other long term (current) drug therapy: Secondary | ICD-10-CM | POA: Diagnosis not present

## 2016-02-24 DIAGNOSIS — I129 Hypertensive chronic kidney disease with stage 1 through stage 4 chronic kidney disease, or unspecified chronic kidney disease: Secondary | ICD-10-CM | POA: Insufficient documentation

## 2016-02-24 DIAGNOSIS — Z792 Long term (current) use of antibiotics: Secondary | ICD-10-CM | POA: Diagnosis not present

## 2016-02-24 DIAGNOSIS — E119 Type 2 diabetes mellitus without complications: Secondary | ICD-10-CM | POA: Insufficient documentation

## 2016-02-24 DIAGNOSIS — Z794 Long term (current) use of insulin: Secondary | ICD-10-CM | POA: Diagnosis not present

## 2016-02-24 DIAGNOSIS — Z7902 Long term (current) use of antithrombotics/antiplatelets: Secondary | ICD-10-CM | POA: Diagnosis not present

## 2016-02-24 DIAGNOSIS — Y998 Other external cause status: Secondary | ICD-10-CM | POA: Insufficient documentation

## 2016-02-24 DIAGNOSIS — N184 Chronic kidney disease, stage 4 (severe): Secondary | ICD-10-CM | POA: Diagnosis not present

## 2016-02-24 DIAGNOSIS — Y9289 Other specified places as the place of occurrence of the external cause: Secondary | ICD-10-CM | POA: Diagnosis not present

## 2016-02-24 DIAGNOSIS — Z7982 Long term (current) use of aspirin: Secondary | ICD-10-CM | POA: Insufficient documentation

## 2016-02-24 LAB — GLUCOSE, CAPILLARY
GLUCOSE-CAPILLARY: 142 mg/dL — AB (ref 65–99)
GLUCOSE-CAPILLARY: 22 mg/dL — AB (ref 65–99)
GLUCOSE-CAPILLARY: 205 mg/dL — AB (ref 65–99)
Glucose-Capillary: 60 mg/dL — ABNORMAL LOW (ref 65–99)
Glucose-Capillary: 70 mg/dL (ref 65–99)
Glucose-Capillary: 124 mg/dL — ABNORMAL HIGH (ref 65–99)

## 2016-02-24 LAB — CBC
HCT: 25.5 % — ABNORMAL LOW (ref 35.0–47.0)
HEMOGLOBIN: 8.4 g/dL — AB (ref 12.0–16.0)
MCH: 31.7 pg (ref 26.0–34.0)
MCHC: 32.9 g/dL (ref 32.0–36.0)
MCV: 96.3 fL (ref 80.0–100.0)
Platelets: 196 10*3/uL (ref 150–440)
RBC: 2.65 MIL/uL — AB (ref 3.80–5.20)
RDW: 14.2 % (ref 11.5–14.5)
WBC: 3.5 10*3/uL — ABNORMAL LOW (ref 3.6–11.0)

## 2016-02-24 LAB — BASIC METABOLIC PANEL
ANION GAP: 3 — AB (ref 5–15)
BUN: 41 mg/dL — ABNORMAL HIGH (ref 6–20)
CALCIUM: 8.8 mg/dL — AB (ref 8.9–10.3)
CO2: 25 mmol/L (ref 22–32)
Chloride: 113 mmol/L — ABNORMAL HIGH (ref 101–111)
Creatinine, Ser: 2.04 mg/dL — ABNORMAL HIGH (ref 0.44–1.00)
GFR, EST AFRICAN AMERICAN: 28 mL/min — AB (ref >60)
GFR, EST NON AFRICAN AMERICAN: 24 mL/min — AB (ref >60)
Glucose, Bld: 56 mg/dL — ABNORMAL LOW (ref 65–99)
Potassium: 3.9 mmol/L (ref 3.5–5.1)
Sodium: 141 mmol/L (ref 135–145)

## 2016-02-24 MED ORDER — DEXTROSE 50 % IV SOLN
INTRAVENOUS | Status: AC
  Administered 2016-02-24: 25 mL via INTRAVENOUS
  Filled 2016-02-24: qty 50

## 2016-02-24 MED ORDER — DEXTROSE 50 % IV SOLN
25.0000 mL | Freq: Once | INTRAVENOUS | Status: AC
Start: 2016-02-24 — End: 2016-02-24
  Administered 2016-02-24: 25 mL via INTRAVENOUS

## 2016-02-24 NOTE — ED Notes (Signed)
Juice and peanut butter given, Dr Edd Fabian made aware.

## 2016-02-24 NOTE — ED Notes (Signed)
Resting at present   MD at bedside

## 2016-02-24 NOTE — ED Notes (Signed)
Ate entire lunch tray  Up to bathroom w/o assistance

## 2016-02-24 NOTE — ED Notes (Signed)
Abrasion cleansed with saline   bandaid applied

## 2016-02-24 NOTE — ED Notes (Signed)
Pt here via EMS from home with c/o taking the wrong insulin on accident this am. States she took 12U of Novolog instead of Lantus. Pt states her blood sugar was 180 prior to injection, was 170 with EMS, pt states she ate pancakes with a lot of syrup, and had some orange juice. Now, pt states she is feeling tired. BG in triage was 60.

## 2016-02-24 NOTE — ED Notes (Signed)
See triage .Marland Kitchen States she took the wrong insulin this am  But did eat and drink some OJ  On arrival fsbs was 60  ..while sitting in lobby she became slightly confused    Recheck of blood sugar was 22 .  Dr Edd Fabian aware.. IV meds given

## 2016-02-24 NOTE — ED Notes (Signed)
Dr Edd Fabian in with pt. Awaiting meal tray

## 2016-02-24 NOTE — ED Provider Notes (Signed)
[facility] Emergency Department Provider Note  ____________________________________________  Time seen: Approximately 12:11 PM  I have reviewed the triage vital signs and the nursing notes.   HISTORY  Chief Complaint No chief complaint on file.    HPI [name] is a 65 y.o. female with history of type 2 diabetes taking Lantus as well as NovoLog sliding scale, coronary artery disease, hypertension who presents for evaluation of accidental insulin overdose, sudden onset, constant since onset this morning, initially mild now severe. The patient reports that she usually takes 12 units of Lantus in the morning. This morning, she accidentally grabbed her bottle of NovoLog instead and took 12 units of NovoLog. She realized the mistake immediately and toaster waffles with syrup. She initially was feeling fine however on the waiting room she was noted to have a decrease in her level of responsiveness. Blood sugar on arrival was in the 60s however despite orange juice this declined to 22 which then improved with IV dextrose. She denies any recent illness include no cough, sneezing, runny nose, congestion, vomiting, diarrhea, fevers or chills. She was hospitalized for NSTEMI earlier this month but reports that she has been recovering well and feels much better. She denies any use of oral antihyperglycemic agents.   Past Medical History  Diagnosis Date  . DIABETES MELLITUS, II, COMPLICATIONS 99991111  . HYPERLIPIDEMIA 02/27/2007  . 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 ANEMIA, OTHER, UNSPECIFIED 02/27/2007  . DEPRESSIVE DISORDER, NOS 02/27/2007  . HYPERTENSION, BENIGN SYSTEMIC 02/27/2007  . CORONARY, ARTERIOSCLEROSIS 02/27/2007  . TIA 05/17/2009  . GASTROESOPHAGEAL REFLUX, NO ESOPHAGITIS 02/27/2007  . RENAL INSUFFICIENCY, CHRONIC 12/28/2009  . Renal failure, acute on chronic (HCC) 01/25/2014  . Blood transfusion without reported diagnosis   . Anemia     Patient Active  Problem List   Diagnosis Date Noted  . Acute MI ([place]) 02/07/2016  . NSTEMI (non-ST elevated myocardial infarction) ([place]) 02/07/2016  . Chronic kidney disease (CKD), stage IV (severe) ([place]) 06/26/2015  . Chest pain with high risk for cardiac etiology 06/24/2015  . Low hemoglobin 02/02/2014  . Abdominal pain, epigastric 02/02/2014  . Black stools 02/02/2014  . Anemia 01/25/2014  . Renal failure, acute on chronic (HCC) 01/25/2014  . MGUS (monoclonal gammopathy of unknown significance) 11/02/2013  . OSTEOARTHROS UNSPEC GEN/LOC OTH SPEC SITES 07/05/2010  . HEAD TRAUMA 05/04/2010  . CELLULITIS AND ABSCESS OF UNSPECIFIED SITE 05/02/2010  . INSOMNIA, CHRONIC 04/06/2010  . HYPERKALEMIA 03/02/2010  . LEG CRAMPS 02/28/2010  . GASTROENTERITIS WITHOUT DEHYDRATION 02/21/2010  . HAIR LOSS 01/31/2010  . PROCTITIS 12/29/2009  . Chronic kidney disease 12/28/2009  . PRURITUS 10/04/2009  . ULCER, RECTUM 09/28/2009  . UNSPECIFIED CONSTIPATION 09/17/2009  . 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 FATIGUE 08/05/2009  . ABDOMINAL PAIN, UNSPECIFIED SITE 08/05/2009  . TIA 05/17/2009  . 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 ANXIETY 06/30/2008  . URGENCY OF URINATION 06/30/2008  . DIABETES MELLITUS, II, COMPLICATIONS Q000111Q  . HYPERLIPIDEMIA 02/27/2007  . Anemia 02/27/2007  . DEPRESSIVE DISORDER, NOS 02/27/2007  . HYPERTENSION, BENIGN SYSTEMIC 02/27/2007  . CORONARY, ARTERIOSCLEROSIS 02/27/2007  . GASTROESOPHAGEAL REFLUX, NO ESOPHAGITIS 02/27/2007  . BACK PAIN, LOW 02/27/2007  . FIBROMYALGIA, FIBROMYOSITIS 02/27/2007    Past Surgical History  Procedure Laterality Date  . Partial hysterectomy    . Rectocele repair    . Bladder surgery      Tack    Current Outpatient Rx  Name  Route  Sig  Dispense  Refill  .  ALPRAZolam (XANAX) 1 MG tablet   Oral   Take 0.5-1 mg by mouth 3 (three) times daily as needed for anxiety.          Marland Kitchen aspirin 81 MG chewable tablet   Oral    Chew 81 mg by mouth at bedtime.         . carvedilol (COREG) 3.125 MG tablet   Oral   Take 1 tablet (3.125 mg total) by mouth 2 (two) times daily with a meal.   60 tablet   0   . Cholecalciferol (VITAMIN D3) 5000 units TABS   Oral   Take 5,000 Units by mouth daily.         . Choline Fenofibrate (FENOFIBRIC ACID) 135 MG CPDR   Oral   Take 135 mg by mouth daily.         . clopidogrel (PLAVIX) 75 MG tablet   Oral   Take 75 mg by mouth at bedtime.          Marland Kitchen FLUoxetine (PROZAC) 20 MG capsule   Oral   Take 20 mg by mouth 2 (two) times daily.          Marland Kitchen gabapentin (NEURONTIN) 300 MG capsule   Oral   Take 600 mg by mouth 3 (three) times daily.          Marland Kitchen glucagon (GLUCAGON EMERGENCY) 1 MG injection   Intravenous   Inject 1 mg into the vein once as needed.   1 each   12   . insulin aspart (NOVOLOG) 100 UNIT/ML injection   Subcutaneous   Inject 2-12 Units into the skin 3 (three) times daily with meals as needed for high blood sugar. Pt uses as needed per sliding scale.         . insulin glargine (LANTUS) 100 UNIT/ML injection   Subcutaneous   Inject 12 Units into the skin daily.         . isosorbide mononitrate (IMDUR) 60 MG 24 hr tablet   Oral   Take 1 tablet (60 mg total) by mouth daily.   30 tablet   0   . ketoconazole (NIZORAL) 2 % shampoo   Topical   Apply 1 application topically 2 (two) times a week.         . nitroGLYCERIN (NITROSTAT) 0.4 MG SL tablet   Sublingual   Place 0.4 mg under the tongue every 5 (five) minutes as needed for chest pain.          . pantoprazole (PROTONIX) 40 MG tablet   Oral   Take 1 tablet (40 mg total) by mouth 2 (two) times daily.   60 tablet   6   . promethazine (PHENERGAN) 25 MG tablet   Oral   Take 25 mg by mouth every 6 (six) hours as needed for nausea or vomiting.          . simvastatin (ZOCOR) 20 MG tablet   Oral   Take 20 mg by mouth at bedtime.          . topiramate (TOPAMAX) 100 MG tablet    Oral   Take 100 mg by mouth 2 (two) times daily.            Allergies Hydralazine hcl; Bisphosphonates; Codeine phosphate; Penicillins; and Sulfa antibiotics  Family History  Problem Relation Age of Onset  . Cancer Mother     unknown  . CAD Mother   . Cancer Sister     unknown cancer ?breast  ca    Social History Social History  Substance Use Topics  . Smoking status: Never Smoker   . Smokeless tobacco: Never Used  . Alcohol Use: No    Review of Systems Constitutional: No fever/chills Eyes: No visual changes. ENT: No sore throat. Cardiovascular: Denies chest pain. Respiratory: Denies shortness of breath. Gastrointestinal: No abdominal pain.  No nausea, no vomiting.  No diarrhea.  No constipation. Genitourinary: Negative for dysuria. Musculoskeletal: Negative for back pain. Skin: Negative for rash. Neurological: Negative for headaches, focal weakness or numbness.  10-point ROS otherwise negative.  ____________________________________________   PHYSICAL EXAM:  VITAL SIGNS: ED Triage Vitals  Enc Vitals Group     BP 02/24/16 1009 117/49 mmHg     Pulse Rate 02/24/16 1009 84     Resp 02/24/16 1009 18     Temp 02/24/16 1009 98.1 F (36.7 C)     Temp Source 02/24/16 1009 Oral     SpO2 02/24/16 1009 100 %     Weight 02/24/16 1009 110 lb (49.896 kg)     Height 02/24/16 1009 5\' 3"  (1.6 m)     Head Cir --      Peak Flow --      Pain Score --      Pain Loc --      Pain Edu? --      Excl. in [place]? --     Constitutional: Appears mildly lethargic but opens her eyes to command and answers most simple questions appropriately. Eyes: Conjunctivae are normal. PERRL. EOMI. Head: Atraumatic. Nose: No congestion/rhinnorhea. Mouth/Throat: Mucous membranes are moist.  Oropharynx non-erythematous. Neck: No stridor. Supple without meningismus. Cardiovascular: Normal rate, regular rhythm. Grossly normal heart sounds.  Good peripheral circulation. Respiratory: Normal  respiratory effort.  No retractions. Lungs CTAB. Gastrointestinal: Soft and nontender. No distention.  No CVA tenderness. Genitourinary: deferred Musculoskeletal: No lower extremity tenderness nor edema.  No joint effusions. Neurologic:  Normal speech and language. No gross focal neurologic deficits are appreciated. No gait instability. Skin:  Skin is warm, dry and intact. No rash noted. Psychiatric: Mood and affect are normal. Speech and behavior are normal.  ____________________________________________   LABS (all labs ordered are listed, but only abnormal results are displayed)  Labs Reviewed  GLUCOSE, CAPILLARY - Abnormal; Notable for the following:    Glucose-Capillary 60 (*)    All other components within normal limits  GLUCOSE, CAPILLARY - Abnormal; Notable for the following:    Glucose-Capillary 22 (*)    All other components within normal limits  BASIC METABOLIC PANEL - Abnormal; Notable for the following:    Chloride 113 (*)    Glucose, Bld 56 (*)    BUN 41 (*)    Creatinine, Ser 2.04 (*)    Calcium 8.8 (*)    GFR calc non Af Amer 24 (*)    GFR calc Af Amer 28 (*)    Anion gap 3 (*)    All other components within normal limits  CBC - Abnormal; Notable for the following:    WBC 3.5 (*)    RBC 2.65 (*)    Hemoglobin 8.4 (*)    HCT 25.5 (*)    All other components within normal limits  GLUCOSE, CAPILLARY - Abnormal; Notable for the following:    Glucose-Capillary 142 (*)    All other components within normal limits  GLUCOSE, CAPILLARY - Abnormal; Notable for the following:    Glucose-Capillary 124 (*)    All other components within normal limits  GLUCOSE, CAPILLARY - Abnormal; Notable for the following:    Glucose-Capillary 205 (*)    All other components within normal limits  GLUCOSE, CAPILLARY  CBG MONITORING, ED    ____________________________________________  EKG  none ____________________________________________  RADIOLOGY  none ____________________________________________   PROCEDURES  Procedure(s) performed: None  Critical Care performed: No  ____________________________________________   INITIAL IMPRESSION / ASSESSMENT AND PLAN / ED COURSE  Pertinent labs & imaging results that were available during my care of the patient were reviewed by me and considered in my medical decision making (see chart for details).  SHEYANNE GRILLOT is a 65 y.o. female with history of type 2 diabetes taking Lantus as well as NovoLog sliding scale, coronary artery disease, hypertension who presents for evaluation of accidental insulin overdose. On arrival to hallway bed 5, she appeared mildly lethargic and it was noted that her glucose had dropped to the 20s from the 60s despite her drinking orange juice and eating in the waiting room. Her lethargy resolved completely within several seconds of her receiving an amp of D50 and she was back to her baseline mental status. We'll obtain screening labs, and for observation with serial glucose checks.  ----------------------------------------- 3:23 PM on 02/24/2016 ----------------------------------------- Patient has been observed for greater than 3 hours with normalization of her blood sugar. She has had 3 glucose check was within normal limits. Her labs are reviewed. BMP with chronic stable creatinine elevation. CBC with chronic stable anemia and her hemoglobin appears to be at its baseline. The patient appears well, she is ambulating around the room and is suitable for discharge. She is asymptomatic. We discussed return precautions, need for close PCP follow-up and she is comfortable with the discharge plan. DC home.  ____________________________________________   FINAL CLINICAL IMPRESSION(S) / ED DIAGNOSES  Final diagnoses:  Insulin overdose,  accidental or unintentional, initial encounter      Joanne Gavel, MD 02/24/16 1525

## 2016-02-29 ENCOUNTER — Encounter: Payer: Self-pay | Admitting: Internal Medicine

## 2016-02-29 ENCOUNTER — Encounter: Payer: Self-pay | Admitting: Gastroenterology

## 2016-03-07 ENCOUNTER — Inpatient Hospital Stay: Payer: Commercial Managed Care - HMO

## 2016-03-08 ENCOUNTER — Telehealth: Payer: Self-pay

## 2016-03-08 NOTE — Telephone Encounter (Signed)
Daughter in-law is calling to request what to do about low hemoglobin from PCP.  Also received a fax from [facility] with lab results hemoglobin 7.0.  Will show the labs to MD.

## 2016-03-09 ENCOUNTER — Inpatient Hospital Stay
Admission: EM | Admit: 2016-03-09 | Discharge: 2016-03-11 | DRG: 815 | Disposition: A | Discharge status: Home / Self Care | Payer: Commercial Managed Care - HMO | Attending: Internal Medicine | Admitting: Internal Medicine

## 2016-03-09 ENCOUNTER — Telehealth: Payer: Self-pay | Admitting: *Deleted

## 2016-03-09 ENCOUNTER — Encounter: Payer: Self-pay | Admitting: Emergency Medicine

## 2016-03-09 DIAGNOSIS — Z882 Allergy status to sulfonamides status: Secondary | ICD-10-CM

## 2016-03-09 DIAGNOSIS — I252 Old myocardial infarction: Secondary | ICD-10-CM

## 2016-03-09 DIAGNOSIS — Z794 Long term (current) use of insulin: Secondary | ICD-10-CM | POA: Diagnosis not present

## 2016-03-09 DIAGNOSIS — Z7982 Long term (current) use of aspirin: Secondary | ICD-10-CM

## 2016-03-09 DIAGNOSIS — Z8249 Family history of ischemic heart disease and other diseases of the circulatory system: Secondary | ICD-10-CM

## 2016-03-09 DIAGNOSIS — Z888 Allergy status to other drugs, medicaments and biological substances status: Secondary | ICD-10-CM

## 2016-03-09 DIAGNOSIS — Z880 Allergy status to penicillin: Secondary | ICD-10-CM | POA: Diagnosis not present

## 2016-03-09 DIAGNOSIS — Z90710 Acquired absence of both cervix and uterus: Secondary | ICD-10-CM | POA: Diagnosis not present

## 2016-03-09 DIAGNOSIS — Z8673 Personal history of transient ischemic attack (TIA), and cerebral infarction without residual deficits: Secondary | ICD-10-CM

## 2016-03-09 DIAGNOSIS — R5383 Other fatigue: Secondary | ICD-10-CM

## 2016-03-09 DIAGNOSIS — K529 Noninfective gastroenteritis and colitis, unspecified: Secondary | ICD-10-CM | POA: Diagnosis present

## 2016-03-09 DIAGNOSIS — Z9889 Other specified postprocedural states: Secondary | ICD-10-CM | POA: Diagnosis not present

## 2016-03-09 DIAGNOSIS — E875 Hyperkalemia: Secondary | ICD-10-CM | POA: Diagnosis present

## 2016-03-09 DIAGNOSIS — N184 Chronic kidney disease, stage 4 (severe): Secondary | ICD-10-CM | POA: Diagnosis present

## 2016-03-09 DIAGNOSIS — K219 Gastro-esophageal reflux disease without esophagitis: Secondary | ICD-10-CM | POA: Diagnosis present

## 2016-03-09 DIAGNOSIS — N189 Chronic kidney disease, unspecified: Secondary | ICD-10-CM

## 2016-03-09 DIAGNOSIS — Z886 Allergy status to analgesic agent status: Secondary | ICD-10-CM | POA: Diagnosis not present

## 2016-03-09 DIAGNOSIS — G43909 Migraine, unspecified, not intractable, without status migrainosus: Secondary | ICD-10-CM | POA: Diagnosis present

## 2016-03-09 DIAGNOSIS — D649 Anemia, unspecified: Secondary | ICD-10-CM

## 2016-03-09 DIAGNOSIS — E1122 Type 2 diabetes mellitus with diabetic chronic kidney disease: Secondary | ICD-10-CM | POA: Diagnosis present

## 2016-03-09 DIAGNOSIS — D638 Anemia in other chronic diseases classified elsewhere: Secondary | ICD-10-CM | POA: Diagnosis present

## 2016-03-09 DIAGNOSIS — Z809 Family history of malignant neoplasm, unspecified: Secondary | ICD-10-CM | POA: Diagnosis not present

## 2016-03-09 DIAGNOSIS — D472 Monoclonal gammopathy: Secondary | ICD-10-CM | POA: Diagnosis not present

## 2016-03-09 DIAGNOSIS — Z79899 Other long term (current) drug therapy: Secondary | ICD-10-CM

## 2016-03-09 DIAGNOSIS — I129 Hypertensive chronic kidney disease with stage 1 through stage 4 chronic kidney disease, or unspecified chronic kidney disease: Secondary | ICD-10-CM | POA: Diagnosis present

## 2016-03-09 DIAGNOSIS — I2510 Atherosclerotic heart disease of native coronary artery without angina pectoris: Secondary | ICD-10-CM | POA: Diagnosis present

## 2016-03-09 DIAGNOSIS — R109 Unspecified abdominal pain: Secondary | ICD-10-CM

## 2016-03-09 DIAGNOSIS — R634 Abnormal weight loss: Secondary | ICD-10-CM

## 2016-03-09 LAB — PROTIME-INR
INR: 1.15
Prothrombin Time: 14.9 seconds (ref 11.4–15.0)

## 2016-03-09 LAB — URINALYSIS COMPLETE WITH MICROSCOPIC (ARMC ONLY)
BACTERIA UA: NONE SEEN
Bilirubin Urine: NEGATIVE
Glucose, UA: NEGATIVE mg/dL
Hgb urine dipstick: NEGATIVE
Ketones, ur: NEGATIVE mg/dL
LEUKOCYTES UA: NEGATIVE
Nitrite: NEGATIVE
PH: 5 (ref 5.0–8.0)
PROTEIN: NEGATIVE mg/dL
SQUAMOUS EPITHELIAL / LPF: NONE SEEN
Specific Gravity, Urine: 1.013 (ref 1.005–1.030)

## 2016-03-09 LAB — COMPREHENSIVE METABOLIC PANEL
ALK PHOS: 55 U/L (ref 38–126)
ALT: 25 U/L (ref 14–54)
AST: 55 U/L — ABNORMAL HIGH (ref 15–41)
Albumin: 3.1 g/dL — ABNORMAL LOW (ref 3.5–5.0)
Anion gap: 5 (ref 5–15)
BILIRUBIN TOTAL: 0.1 mg/dL — AB (ref 0.3–1.2)
BUN: 50 mg/dL — ABNORMAL HIGH (ref 6–20)
CALCIUM: 8.3 mg/dL — AB (ref 8.9–10.3)
CO2: 18 mmol/L — AB (ref 22–32)
CREATININE: 1.99 mg/dL — AB (ref 0.44–1.00)
Chloride: 119 mmol/L — ABNORMAL HIGH (ref 101–111)
GFR calc non Af Amer: 25 mL/min — ABNORMAL LOW (ref >60)
GFR, EST AFRICAN AMERICAN: 29 mL/min — AB (ref >60)
GLUCOSE: 92 mg/dL (ref 65–99)
Potassium: 5.4 mmol/L — ABNORMAL HIGH (ref 3.5–5.1)
SODIUM: 142 mmol/L (ref 135–145)
TOTAL PROTEIN: 5.5 g/dL — AB (ref 6.5–8.1)

## 2016-03-09 LAB — CBC WITH DIFFERENTIAL/PLATELET
Basophils Absolute: 0 10*3/uL (ref 0–0.1)
Basophils Relative: 1 %
EOS ABS: 0.3 10*3/uL (ref 0–0.7)
Eosinophils Relative: 6 %
HEMATOCRIT: 20.6 % — AB (ref 35.0–47.0)
HEMOGLOBIN: 6.7 g/dL — AB (ref 12.0–16.0)
LYMPHS ABS: 1 10*3/uL (ref 1.0–3.6)
Lymphocytes Relative: 24 %
MCH: 31.8 pg (ref 26.0–34.0)
MCHC: 32.5 g/dL (ref 32.0–36.0)
MCV: 97.8 fL (ref 80.0–100.0)
MONOS PCT: 8 %
Monocytes Absolute: 0.3 10*3/uL (ref 0.2–0.9)
NEUTROS PCT: 61 %
Neutro Abs: 2.6 10*3/uL (ref 1.4–6.5)
Platelets: 146 10*3/uL — ABNORMAL LOW (ref 150–440)
RBC: 2.11 MIL/uL — AB (ref 3.80–5.20)
RDW: 15.6 % — ABNORMAL HIGH (ref 11.5–14.5)
WBC: 4.3 10*3/uL (ref 3.6–11.0)

## 2016-03-09 LAB — TROPONIN I: Troponin I: <0.03 ng/mL (ref <0.031)

## 2016-03-09 LAB — PREPARE RBC (CROSSMATCH)

## 2016-03-09 LAB — GLUCOSE, CAPILLARY
GLUCOSE-CAPILLARY: 112 mg/dL — AB (ref 65–99)
Glucose-Capillary: 164 mg/dL — ABNORMAL HIGH (ref 65–99)

## 2016-03-09 LAB — HEMOGLOBIN AND HEMATOCRIT, BLOOD
HEMATOCRIT: 26.7 % — AB (ref 35.0–47.0)
HEMOGLOBIN: 8.9 g/dL — AB (ref 12.0–16.0)

## 2016-03-09 MED ORDER — PANTOPRAZOLE SODIUM 40 MG PO TBEC: 40.0000 mg | DELAYED_RELEASE_TABLET | Freq: Two times a day (BID) | ORAL | Status: DC

## 2016-03-09 MED ORDER — CLOPIDOGREL BISULFATE 75 MG PO TABS
75.0000 mg | ORAL_TABLET | Freq: Every day | ORAL | Status: DC
  Administered 2016-03-09 – 2016-03-10 (×2): 75 mg via ORAL
  Filled 2016-03-09 (×2): qty 1

## 2016-03-09 MED ORDER — FENOFIBRATE 160 MG PO TABS
160.0000 mg | ORAL_TABLET | Freq: Every day | ORAL | Status: DC
  Administered 2016-03-10 – 2016-03-11 (×2): 160 mg via ORAL
  Filled 2016-03-09 (×4): qty 1

## 2016-03-09 MED ORDER — KETOCONAZOLE 2 % EX SHAM
1.0000 "application " | MEDICATED_SHAMPOO | CUTANEOUS | Status: DC
  Filled 2016-03-09: qty 120

## 2016-03-09 MED ORDER — NITROGLYCERIN 0.4 MG SL SUBL: 0.4000 mg | SUBLINGUAL_TABLET | SUBLINGUAL | Status: DC | PRN

## 2016-03-09 MED ORDER — VITAMIN D 1000 UNITS PO TABS
5000.0000 [IU] | ORAL_TABLET | Freq: Every day | ORAL | Status: DC
  Administered 2016-03-10 – 2016-03-11 (×2): 5000 [IU] via ORAL
  Filled 2016-03-09 (×4): qty 5

## 2016-03-09 MED ORDER — CARVEDILOL 3.125 MG PO TABS
3.1250 mg | ORAL_TABLET | Freq: Two times a day (BID) | ORAL | Status: DC
  Administered 2016-03-10 – 2016-03-11 (×3): 3.125 mg via ORAL
  Filled 2016-03-09 (×3): qty 1

## 2016-03-09 MED ORDER — ALPRAZOLAM 0.5 MG PO TABS
0.5000 mg | ORAL_TABLET | Freq: Three times a day (TID) | ORAL | Status: DC | PRN
  Administered 2016-03-09 – 2016-03-11 (×3): 0.5 mg via ORAL
  Filled 2016-03-09 (×3): qty 1

## 2016-03-09 MED ORDER — FLUOXETINE HCL 20 MG PO CAPS
20.0000 mg | ORAL_CAPSULE | Freq: Two times a day (BID) | ORAL | Status: DC
  Administered 2016-03-09 – 2016-03-11 (×4): 20 mg via ORAL
  Filled 2016-03-09 (×4): qty 1

## 2016-03-09 MED ORDER — SODIUM CHLORIDE 0.9 % IV SOLN: 10.0000 mL/h | Freq: Once | INTRAVENOUS | Status: DC

## 2016-03-09 MED ORDER — INSULIN ASPART 100 UNIT/ML ~~LOC~~ SOLN: 2.0000 [IU] | Freq: Three times a day (TID) | SUBCUTANEOUS | Status: DC | PRN

## 2016-03-09 MED ORDER — PANTOPRAZOLE SODIUM 40 MG IV SOLR
40.0000 mg | Freq: Two times a day (BID) | INTRAVENOUS | Status: DC
  Administered 2016-03-09 – 2016-03-11 (×4): 40 mg via INTRAVENOUS
  Filled 2016-03-09 (×4): qty 40

## 2016-03-09 MED ORDER — DIPHENHYDRAMINE HCL 50 MG/ML IJ SOLN
25.0000 mg | Freq: Once | INTRAMUSCULAR | Status: AC
  Administered 2016-03-09: 25 mg via INTRAVENOUS

## 2016-03-09 MED ORDER — ISOSORBIDE MONONITRATE ER 30 MG PO TB24
60.0000 mg | ORAL_TABLET | Freq: Every day | ORAL | Status: DC
  Administered 2016-03-10 – 2016-03-11 (×2): 60 mg via ORAL
  Filled 2016-03-09 (×2): qty 2
  Filled 2016-03-09: qty 1

## 2016-03-09 MED ORDER — FUROSEMIDE 10 MG/ML IJ SOLN
40.0000 mg | Freq: Once | INTRAMUSCULAR | Status: AC
  Administered 2016-03-09: 40 mg via INTRAVENOUS
  Filled 2016-03-09: qty 4

## 2016-03-09 MED ORDER — PROMETHAZINE HCL 25 MG PO TABS: 25.0000 mg | ORAL_TABLET | Freq: Four times a day (QID) | ORAL | Status: DC | PRN

## 2016-03-09 MED ORDER — GABAPENTIN 300 MG PO CAPS
600.0000 mg | ORAL_CAPSULE | Freq: Three times a day (TID) | ORAL | Status: DC
  Administered 2016-03-09 – 2016-03-11 (×5): 600 mg via ORAL
  Filled 2016-03-09 (×5): qty 2

## 2016-03-09 MED ORDER — TOPIRAMATE 100 MG PO TABS
100.0000 mg | ORAL_TABLET | Freq: Two times a day (BID) | ORAL | Status: DC
  Administered 2016-03-09 – 2016-03-11 (×4): 100 mg via ORAL
  Filled 2016-03-09 (×5): qty 1

## 2016-03-09 MED ORDER — LISINOPRIL 5 MG PO TABS
2.5000 mg | ORAL_TABLET | Freq: Every day | ORAL | Status: DC
  Administered 2016-03-09 – 2016-03-10 (×2): 2.5 mg via ORAL
  Filled 2016-03-09 (×3): qty 1

## 2016-03-09 MED ORDER — ACETAMINOPHEN 325 MG PO TABS
650.0000 mg | ORAL_TABLET | Freq: Four times a day (QID) | ORAL | Status: DC | PRN
  Administered 2016-03-09 – 2016-03-11 (×2): 650 mg via ORAL
  Filled 2016-03-09 (×2): qty 2

## 2016-03-09 MED ORDER — DIPHENHYDRAMINE HCL 50 MG/ML IJ SOLN
25.0000 mg | Freq: Once | INTRAMUSCULAR | Status: DC
  Filled 2016-03-09: qty 1

## 2016-03-09 MED ORDER — SIMVASTATIN 20 MG PO TABS
20.0000 mg | ORAL_TABLET | Freq: Every day | ORAL | Status: DC
  Administered 2016-03-09 – 2016-03-10 (×2): 20 mg via ORAL
  Filled 2016-03-09 (×2): qty 1

## 2016-03-09 MED ORDER — INSULIN GLARGINE 100 UNIT/ML ~~LOC~~ SOLN
12.0000 [IU] | Freq: Every day | SUBCUTANEOUS | Status: DC
  Administered 2016-03-10: 12 [IU] via SUBCUTANEOUS
  Filled 2016-03-09 (×2): qty 0.12

## 2016-03-09 NOTE — ED Provider Notes (Addendum)
[facility] Emergency Department Provider Note     Time seen: ----------------------------------------- 2:00 PM on 03/09/2016 -----------------------------------------    I have reviewed the triage vital signs and the nursing notes.   HISTORY  Chief Complaint Fatigue    HPI [name] is a 65 y.o. female who presents to ER for fatigue, generalized pain and weakness. She recently had blood work done by her primary care doctor who states she has a hemoglobin of 7. She was encouraged comes to ER to receive blood because she cannot get into the cancer center. She denies any recent illness,denies changes in her medicines. Nothing makes her symptoms better or worse.   Past Medical History  Diagnosis Date  . DIABETES MELLITUS, II, COMPLICATIONS 99991111  . HYPERLIPIDEMIA 02/27/2007  . 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 ANEMIA, OTHER, UNSPECIFIED 02/27/2007  . DEPRESSIVE DISORDER, NOS 02/27/2007  . HYPERTENSION, BENIGN SYSTEMIC 02/27/2007  . CORONARY, ARTERIOSCLEROSIS 02/27/2007  . TIA 05/17/2009  . GASTROESOPHAGEAL REFLUX, NO ESOPHAGITIS 02/27/2007  . RENAL INSUFFICIENCY, CHRONIC 12/28/2009  . Renal failure, acute on chronic (HCC) 01/25/2014  . Blood transfusion without reported diagnosis   . Anemia     Patient Active Problem List   Diagnosis Date Noted  . Acute MI ([place]) 02/07/2016  . NSTEMI (non-ST elevated myocardial infarction) ([place]) 02/07/2016  . Chronic kidney disease (CKD), stage IV (severe) ([place]) 06/26/2015  . Chest pain with high risk for cardiac etiology 06/24/2015  . Low hemoglobin 02/02/2014  . Abdominal pain, epigastric 02/02/2014  . Black stools 02/02/2014  . Anemia 01/25/2014  . Renal failure, acute on chronic (HCC) 01/25/2014  . MGUS (monoclonal gammopathy of unknown significance) 11/02/2013  . OSTEOARTHROS UNSPEC GEN/LOC OTH SPEC SITES 07/05/2010  . HEAD TRAUMA 05/04/2010  . CELLULITIS AND ABSCESS OF UNSPECIFIED SITE 05/02/2010   . INSOMNIA, CHRONIC 04/06/2010  . HYPERKALEMIA 03/02/2010  . LEG CRAMPS 02/28/2010  . GASTROENTERITIS WITHOUT DEHYDRATION 02/21/2010  . HAIR LOSS 01/31/2010  . PROCTITIS 12/29/2009  . Chronic kidney disease 12/28/2009  . PRURITUS 10/04/2009  . ULCER, RECTUM 09/28/2009  . UNSPECIFIED CONSTIPATION 09/17/2009  . 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 FATIGUE 08/05/2009  . ABDOMINAL PAIN, UNSPECIFIED SITE 08/05/2009  . TIA 05/17/2009  . 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 ANXIETY 06/30/2008  . URGENCY OF URINATION 06/30/2008  . DIABETES MELLITUS, II, COMPLICATIONS Q000111Q  . HYPERLIPIDEMIA 02/27/2007  . Anemia 02/27/2007  . DEPRESSIVE DISORDER, NOS 02/27/2007  . HYPERTENSION, BENIGN SYSTEMIC 02/27/2007  . CORONARY, ARTERIOSCLEROSIS 02/27/2007  . GASTROESOPHAGEAL REFLUX, NO ESOPHAGITIS 02/27/2007  . BACK PAIN, LOW 02/27/2007  . FIBROMYALGIA, FIBROMYOSITIS 02/27/2007    Past Surgical History  Procedure Laterality Date  . Partial hysterectomy    . Rectocele repair    . Bladder surgery      Tack    Allergies Hydralazine hcl; Bisphosphonates; Codeine phosphate; Penicillins; and Sulfa antibiotics  Social History Social History  Substance Use Topics  . Smoking status: Never Smoker   . Smokeless tobacco: Never Used  . Alcohol Use: No    Review of Systems Constitutional: Negative for fever.Positive for generalized pain Eyes: Negative for visual changes. ENT: Negative for sore throat. Cardiovascular: Negative for chest pain. Respiratory: Negative for shortness of breath. Gastrointestinal: Negative for abdominal pain, vomiting and diarrhea. Genitourinary: Negative for dysuria. Musculoskeletal: Negative for back pain. Skin: Negative for rash. Neurological: Negative for headaches, positive for generalized weakness  10-point ROS otherwise negative.  ____________________________________________   PHYSICAL EXAM:  VITAL SIGNS: ED Triage  Vitals   Enc Vitals Group     BP --      Pulse Rate 03/09/16 1348 92     Resp 03/09/16 1348 18     Temp 03/09/16 1348 98 F (36.7 C)     Temp Source 03/09/16 1348 Oral     SpO2 03/09/16 1348 100 %     Weight 03/09/16 1348 115 lb (52.164 kg)     Height 03/09/16 1348 5\' 3"  (1.6 m)     Head Cir --      Peak Flow --      Pain Score 03/09/16 1348 9     Pain Loc --      Pain Edu? --      Excl. in [place]? --     Constitutional: Alert and oriented. No acute distress Eyes: Conjunctivae are pale. PERRL. Normal extraocular movements. ENT   Head: Normocephalic and atraumatic.   Nose: No congestion/rhinnorhea.   Mouth/Throat: Mucous membranes are moist.   Neck: No stridor. Cardiovascular: Normal rate, regular rhythm. Normal and symmetric distal pulses are present in all extremities. No murmurs, rubs, or gallops. Respiratory: Normal respiratory effort without tachypnea nor retractions. Breath sounds are clear and equal bilaterally. No wheezes/rales/rhonchi. Gastrointestinal: Soft and nontender. No distention. No abdominal bruits.  Musculoskeletal: Nontender with normal range of motion in all extremities. No joint effusions. Bilateral lower extremity pitting edema Neurologic:  Normal speech and language. No gross focal neurologic deficits are appreciated.  Skin:  Skin is warm, dry and intact. Pallor is noted Psychiatric: Depressed mood and affect ____________________________________________  EKG: Interpreted by me.Normal sinus rhythm with a rate of 78 bpm, normal PR interval, normal QRS, normal QT interval. Normal axis.  ____________________________________________  ED COURSE:  Pertinent labs & imaging results that were available during my care of the patient were reviewed by me and considered in my medical decision making (see chart for details). Patient with generalized weakness, we'll obtain basic labs and consider blood transfusion if  needed. ____________________________________________    LABS (pertinent positives/negatives)  Labs Reviewed  CBC WITH DIFFERENTIAL/PLATELET - Abnormal; Notable for the following:    RBC 2.11 (*)    Hemoglobin 6.7 (*)    HCT 20.6 (*)    RDW 15.6 (*)    Platelets 146 (*)    All other components within normal limits  COMPREHENSIVE METABOLIC PANEL - Abnormal; Notable for the following:    Potassium 5.4 (*)    Chloride 119 (*)    CO2 18 (*)    BUN 50 (*)    Creatinine, Ser 1.99 (*)    Calcium 8.3 (*)    Total Protein 5.5 (*)    Albumin 3.1 (*)    AST 55 (*)    Total Bilirubin 0.1 (*)    GFR calc non Af Amer 25 (*)    GFR calc Af Amer 29 (*)    All other components within normal limits  URINALYSIS COMPLETEWITH MICROSCOPIC ([facility] ONLY) - Abnormal; Notable for the following:    Color, Urine YELLOW (*)    APPearance CLEAR (*)    All other components within normal limits  GLUCOSE, CAPILLARY - Abnormal; Notable for the following:    Glucose-Capillary 112 (*)    All other components within normal limits  PROTIME-INR  TROPONIN I  CBG MONITORING, ED  TYPE AND SCREEN  PREPARE RBC (CROSSMATCH)   ____________________________________________  FINAL ASSESSMENT AND PLAN  Weakness, Acute on chronic anemia, chronic pain  Plan: Patient with labs and imaging  as dictated above. Patient would benefit from blood transfusion. Hemoglobin is 6.7, I have typed and crossed her for 1 unit of blood. Will recommend admission from the hospitalist.   Earleen Newport, MD   Earleen Newport, MD 03/09/16 [place], MD 03/09/16 [phone]

## 2016-03-09 NOTE — Telephone Encounter (Signed)
Daughter called back after leaving GI office and said if we could not see her today that she was told to take her to the ER. I told her I was sorry that we could not accommodate her today. She said thank you, I will take her to the ER then

## 2016-03-09 NOTE — ED Notes (Signed)
Reports fatigue and he MD drew blood.  Called pt and told her to come to ER for hgb of 7

## 2016-03-09 NOTE — Telephone Encounter (Signed)
Asking when we are going to see her mother for injection and blood transfusion for a hgbb of 7, she is at GI appt this morning and saw cardiology yesterday. She has cancelled or no showed all of her appts since December

## 2016-03-09 NOTE — H&P (Signed)
[place] [facility] [place] NAME: [name]    MR#:  [id number]  DATE OF BIRTH:  [birth year]  DATE OF ADMISSION:  03/09/2016  PRIMARY CARE PHYSICIAN: [name], MD   REQUESTING/REFERRING PHYSICIAN: Dr.Johnathan Williams  CHIEF COMPLAINT; generalized weakness.    Chief Complaint  Patient presents with  . Fatigue    HISTORY OF PRESENT ILLNESS:  [name]  is a 65 y.o. female with a known history of chronic kidney disease stage IV, recent non-ST elevation MI, migraine disorder, MGUS, diabetes mellitus type 2, comes in because of generalized weakness, pain all over the body. The hemoglobin 6.7 and hematocrit 20.6. 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 Has chronic diarrhea.  has seen Dr. Nehemiah Massed 2 days ago because of worsening pedal edema.  PAST MEDICAL HISTORY:   Past Medical History  Diagnosis Date  . DIABETES MELLITUS, II, COMPLICATIONS 7/82/9562  . HYPERLIPIDEMIA 02/27/2007  . 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 ANEMIA, OTHER, UNSPECIFIED 02/27/2007  . DEPRESSIVE DISORDER, NOS 02/27/2007  . HYPERTENSION, BENIGN SYSTEMIC 02/27/2007  . CORONARY, ARTERIOSCLEROSIS 02/27/2007  . TIA 05/17/2009  . GASTROESOPHAGEAL REFLUX, NO ESOPHAGITIS 02/27/2007  . RENAL INSUFFICIENCY, CHRONIC 12/28/2009  . Renal failure, acute on chronic (HCC) 01/25/2014  . Blood transfusion without reported diagnosis   . Anemia     PAST SURGICAL HISTOIRY:   Past Surgical History  Procedure Laterality Date  . Partial hysterectomy    . Rectocele repair    . Bladder surgery      Tack    SOCIAL HISTORY:   Social History  Substance Use Topics  . Smoking status: Never Smoker   . Smokeless tobacco: Never Used  . Alcohol Use: No    FAMILY HISTORY:   Family History  Problem Relation Age of Onset  . Cancer Mother     unknown  . CAD Mother   . Cancer Sister     unknown cancer ?breast ca    DRUG  ALLERGIES:   Allergies  Allergen Reactions  . Hydralazine Hcl Swelling  . Bisphosphonates Rash  . Codeine Phosphate Nausea And Vomiting and Rash  . Penicillins Itching, Rash and Other (See Comments)    Has patient had a PCN reaction causing immediate rash, facial/tongue/throat swelling, SOB or lightheadedness with hypotension: No Has patient had a PCN reaction causing severe rash involving mucus membranes or skin necrosis: No Has patient had a PCN reaction that required hospitalization No Has patient had a PCN reaction occurring within the last 10 years: No If all of the above answers are "NO", then may proceed with Cephalosporin use.  . Sulfa Antibiotics Itching and Rash    REVIEW OF SYSTEMS:  CONSTITUTIONAL:  generalized weakness, achiness all over the body. EYES: No blurred or double vision.  EARS, NOSE, AND THROAT: No tinnitus or ear pain.  RESPIRATORY: No cough, shortness of breath, wheezing or hemoptysis.  CARDIOVASCULAR: No chest pain, orthopnea, edema.  GASTROINTESTINAL: Chronic abdominal pain and chronic  diarrhea. Patient has diarrhea for 3 years. GENITOURINARY: No dysuria, hematuria.  ENDOCRINE: No polyuria, nocturia,  HEMATOLOGY: Anemia present,guaic  Positive stools present .no easy bruising or bleeding.pt  says that she had bone marrow biopsy 9 times. SKIN: No rash or lesion. MUSCULOSKELETAL: No joint pain or arthritis. As have bilateral lower extremity edema present   NEUROLOGIC: No tingling, numbness, weakness.  PSYCHIATRY: No anxiety or depression.   MEDICATIONS AT HOME:   Prior  to Admission medications   Medication Sig Start Date End Date Taking? Authorizing Provider  ALPRAZolam Duanne Moron) 1 MG tablet Take 0.5-1 mg by mouth 3 (three) times daily as needed for anxiety.     Historical Provider, MD  aspirin 81 MG chewable tablet Chew 81 mg by mouth at bedtime.    Historical Provider, MD  carvedilol (COREG) 3.125 MG tablet Take 1 tablet (3.125 mg total) by mouth 2 (two)  times daily with a meal. 02/10/16   Bettey Costa, MD  Cholecalciferol (VITAMIN D3) 5000 units TABS Take 5,000 Units by mouth daily.    Historical Provider, MD  Choline Fenofibrate (FENOFIBRIC ACID) 135 MG CPDR Take 135 mg by mouth daily.    Historical Provider, MD  clopidogrel (PLAVIX) 75 MG tablet Take 75 mg by mouth at bedtime.     Historical Provider, MD  FLUoxetine (PROZAC) 20 MG capsule Take 20 mg by mouth 2 (two) times daily.     Historical Provider, MD  gabapentin (NEURONTIN) 300 MG capsule Take 600 mg by mouth 3 (three) times daily.     Historical Provider, MD  glucagon (GLUCAGON EMERGENCY) 1 MG injection Inject 1 mg into the vein once as needed. 02/11/16   Dustin Flock, MD  insulin aspart (NOVOLOG) 100 UNIT/ML injection Inject 2-12 Units into the skin 3 (three) times daily with meals as needed for high blood sugar. Pt uses as needed per sliding scale.    Historical Provider, MD  insulin glargine (LANTUS) 100 UNIT/ML injection Inject 12 Units into the skin daily.    Historical Provider, MD  isosorbide mononitrate (IMDUR) 60 MG 24 hr tablet Take 1 tablet (60 mg total) by mouth daily. 02/10/16   Bettey Costa, MD  ketoconazole (NIZORAL) 2 % shampoo Apply 1 application topically 2 (two) times a week.    Historical Provider, MD  nitroGLYCERIN (NITROSTAT) 0.4 MG SL tablet Place 0.4 mg under the tongue every 5 (five) minutes as needed for chest pain.     Historical Provider, MD  pantoprazole (PROTONIX) 40 MG tablet Take 1 tablet (40 mg total) by mouth 2 (two) times daily. 02/10/16   Bettey Costa, MD  promethazine (PHENERGAN) 25 MG tablet Take 25 mg by mouth every 6 (six) hours as needed for nausea or vomiting.     Lucille Passy, MD  simvastatin (ZOCOR) 20 MG tablet Take 20 mg by mouth at bedtime.     Historical Provider, MD  topiramate (TOPAMAX) 100 MG tablet Take 100 mg by mouth 2 (two) times daily.     Historical Provider, MD      VITAL SIGNS:  Blood pressure 140/70, pulse 84, temperature 98 F (36.7  C), temperature source Oral, resp. rate 15, height '5\' 3"'  (1.6 m), weight 52.164 kg (115 lb), SpO2 100 %.  PHYSICAL EXAMINATION:  GENERAL:  65 y.o.-year-old patient lying in the bed with no acute distress.  EYES: Pupils equal, round, reactive to light and accommodation. No scleral icterus. Extraocular muscles intact.  HEENT: Head atraumatic, normocephalic. Oropharynx and nasopharynx clear.  NECK:  Supple, no jugular venous distention. No thyroid enlargement, no tenderness.  LUNGS: Normal breath sounds bilaterally, no wheezing, rales,rhonchi or crepitation. No use of accessory muscles of respiration.  CARDIOVASCULAR: S1, S2 normal. No murmurs, rubs, or gallops.  ABDOMEN: generalized  abdominal tenderness present. no rebound tenderness present .patient says that she  Has  chronic abdominal pains. Bowel sounds present. No organomegaly or mass.  EXTREMITIES: 2+ pedal edema bilaterally.Marland Kitchen  NEUROLOGIC: Cranial nerves II through  XII are intact. Muscle strength 5/5 in all extremities. Sensation intact. Gait not checked.  PSYCHIATRIC: The patient is alert and oriented x 3.  SKIN: No obvious rash, lesion, or ulcer.   LABORATORY PANEL:   CBC  Recent Labs Lab 03/09/16 1408  WBC 4.3  HGB 6.7*  HCT 20.6*  PLT 146*   ------------------------------------------------------------------------------------------------------------------  Chemistries   Recent Labs Lab 03/09/16 1408  NA 142  K 5.4*  CL 119*  CO2 18*  GLUCOSE 92  BUN 50*  CREATININE 1.99*  CALCIUM 8.3*  AST 55*  ALT 25  ALKPHOS 55  BILITOT 0.1*   ------------------------------------------------------------------------------------------------------------------  Cardiac Enzymes  Recent Labs Lab 03/09/16 1408  TROPONINI <0.03   ------------------------------------------------------------------------------------------------------------------  RADIOLOGY:  No results found.  EKG:   Orders placed or performed during the  hospital encounter of 03/09/16  . ED EKG  . ED EKG    IMPRESSION AND PLAN:   #1 acute on chronic anemia: Hemoglobin dropped to 6.8 hematocrit 20.6. Admit her, transfuse 1 unit of packed RBC. #2 history of MGUS requiring previous blood transfusions. Patient also follows up with Dr. Grayland Ormond gets IV iron infusions. #3 possible GI bleed with guaiac positive stools. Patient says that she is probably good cone. EGD and colonoscopy by GI, patient has seen Dr. Candace Cruise before. I will consult GI. Unable to stop aspirin and Plavix because of recent acute non-ST elevation MI and she could not have cardiac catheter because of her renal failure, empirically started on aspirin and Plavix. #4, recent non-ST elevation MI, coronary artery disease: Empirically given aspirin, Plavix, high intensity statins. Follows up with Dr. Nehemiah Massed. Recent echo showed EF more than 55%. #5 .Diabtes mellitus type II: Chronic abdominal pains and chronic diarrhea . Patient takes Phenergan. 6.ckd stage 4; baseline creatinine is 2.so kidney function is stable. #7. Mild hyperkalemia: Follow closely.   All the records are reviewed and case discussed with ED provider. Management plans discussed with the patient, family and they are in agreement.  CODE STATUS: full  TOTAL TIME TAKING CARE OF THIS PATIENT: 66mnutes.    KEpifanio LeschesM.D on 03/09/2016 at 4:28 PM  Between 7am to 6pm - Pager - [phone]  After 6pm go to [email or web address] - password [email or web address]  Office  [phone] CC: Primary care physician; HMaryland Pink MD  Note: This dictation was prepared with Dragon dictation along with smaller phrase technology. Any transcriptional errors that result from this process are unintentional.

## 2016-03-09 NOTE — Telephone Encounter (Signed)
Was advised at GI appt to go to ER.

## 2016-03-10 LAB — GLUCOSE, CAPILLARY
GLUCOSE-CAPILLARY: 115 mg/dL — AB (ref 65–99)
GLUCOSE-CAPILLARY: 32 mg/dL — AB (ref 65–99)
Glucose-Capillary: 57 mg/dL — ABNORMAL LOW (ref 65–99)
Glucose-Capillary: 163 mg/dL — ABNORMAL HIGH (ref 65–99)
Glucose-Capillary: 62 mg/dL — ABNORMAL LOW (ref 65–99)
Glucose-Capillary: 104 mg/dL — ABNORMAL HIGH (ref 65–99)
Glucose-Capillary: 99 mg/dL (ref 65–99)

## 2016-03-10 LAB — CBC
HCT: 27.3 % — ABNORMAL LOW (ref 35.0–47.0)
HEMOGLOBIN: 9.5 g/dL — AB (ref 12.0–16.0)
MCH: 32.4 pg (ref 26.0–34.0)
MCHC: 34.8 g/dL (ref 32.0–36.0)
MCV: 93 fL (ref 80.0–100.0)
Platelets: 135 10*3/uL — ABNORMAL LOW (ref 150–440)
RBC: 2.93 MIL/uL — ABNORMAL LOW (ref 3.80–5.20)
RDW: 16 % — ABNORMAL HIGH (ref 11.5–14.5)
WBC: 3.8 10*3/uL (ref 3.6–11.0)

## 2016-03-10 LAB — COMPREHENSIVE METABOLIC PANEL
ALK PHOS: 56 U/L (ref 38–126)
ALT: 30 U/L (ref 14–54)
ANION GAP: 7 (ref 5–15)
AST: 73 U/L — ABNORMAL HIGH (ref 15–41)
Albumin: 3 g/dL — ABNORMAL LOW (ref 3.5–5.0)
BUN: 53 mg/dL — ABNORMAL HIGH (ref 6–20)
CALCIUM: 8.6 mg/dL — AB (ref 8.9–10.3)
CO2: 23 mmol/L (ref 22–32)
Chloride: 112 mmol/L — ABNORMAL HIGH (ref 101–111)
Creatinine, Ser: 2.28 mg/dL — ABNORMAL HIGH (ref 0.44–1.00)
GFR calc non Af Amer: 21 mL/min — ABNORMAL LOW (ref >60)
GFR, EST AFRICAN AMERICAN: 25 mL/min — AB (ref >60)
Glucose, Bld: 83 mg/dL (ref 65–99)
Potassium: 4.2 mmol/L (ref 3.5–5.1)
SODIUM: 142 mmol/L (ref 135–145)
TOTAL PROTEIN: 5.4 g/dL — AB (ref 6.5–8.1)
Total Bilirubin: 1.1 mg/dL (ref 0.3–1.2)

## 2016-03-10 LAB — FERRITIN: Ferritin: 537 ng/mL — ABNORMAL HIGH (ref 11–307)

## 2016-03-10 LAB — IRON AND TIBC
Iron: 92 ug/dL (ref 28–170)
SATURATION RATIOS: 28 % (ref 10.4–31.8)
TIBC: 330 ug/dL (ref 250–450)
UIBC: 238 ug/dL

## 2016-03-10 MED ORDER — INSULIN ASPART 100 UNIT/ML ~~LOC~~ SOLN: 0.0000 [IU] | Freq: Every day | SUBCUTANEOUS | Status: DC

## 2016-03-10 MED ORDER — HYDROCODONE-ACETAMINOPHEN 5-325 MG PO TABS
1.0000 | ORAL_TABLET | ORAL | Status: DC | PRN
  Administered 2016-03-10 (×2): 1 via ORAL
  Filled 2016-03-10 (×2): qty 1

## 2016-03-10 MED ORDER — INSULIN ASPART 100 UNIT/ML ~~LOC~~ SOLN
0.0000 [IU] | Freq: Three times a day (TID) | SUBCUTANEOUS | Status: DC
  Administered 2016-03-10: 2 [IU] via SUBCUTANEOUS
  Administered 2016-03-11: 5 [IU] via SUBCUTANEOUS
  Filled 2016-03-10: qty 2
  Filled 2016-03-10: qty 5
  Filled 2016-03-10: qty 3

## 2016-03-10 MED ORDER — INSULIN GLARGINE 100 UNIT/ML ~~LOC~~ SOLN
6.0000 [IU] | Freq: Every day | SUBCUTANEOUS | Status: DC
  Administered 2016-03-11: 6 [IU] via SUBCUTANEOUS
  Filled 2016-03-10: qty 0.06

## 2016-03-10 NOTE — Progress Notes (Signed)
[place] [facility] [place] NAME: [name]    MR#:  [id number]  DATE OF BIRTH:  [birth year]  SUBJECTIVE:  Still complains of generalized fatigue weakness  REVIEW OF SYSTEMS:  CONSTITUTIONAL: No fever, positive fatigue or weakness.  EYES: No blurred or double vision.  EARS, NOSE, AND THROAT: No tinnitus or ear pain.  RESPIRATORY: No cough, shortness of breath, wheezing or hemoptysis.  CARDIOVASCULAR: No chest pain, orthopnea, edema.  GASTROINTESTINAL: No nausea, vomiting, diarrhea or abdominal pain.  GENITOURINARY: No dysuria, hematuria.  ENDOCRINE: No polyuria, nocturia,  HEMATOLOGY: No anemia, easy bruising or bleeding SKIN: No rash or lesion. MUSCULOSKELETAL: No joint pain or arthritis.   NEUROLOGIC: No tingling, numbness, weakness.  PSYCHIATRY: No anxiety or depression.   DRUG ALLERGIES:   Allergies  Allergen Reactions  . Hydralazine Hcl Swelling  . Bisphosphonates Rash  . Codeine Phosphate Nausea And Vomiting and Rash  . Penicillins Itching, Rash and Other (See Comments)    Has patient had a PCN reaction causing immediate rash, facial/tongue/throat swelling, SOB or lightheadedness with hypotension: No Has patient had a PCN reaction causing severe rash involving mucus membranes or skin necrosis: No Has patient had a PCN reaction that required hospitalization No Has patient had a PCN reaction occurring within the last 10 years: No If all of the above answers are "NO", then may proceed with Cephalosporin use.  . Sulfa Antibiotics Itching and Rash    VITALS:  Blood pressure 164/65, pulse 82, temperature 98.7 F (37.1 C), temperature source Oral, resp. rate 16, height 5\' 3"  (1.6 m), weight 52.164 kg (115 lb), SpO2 96 %.  PHYSICAL EXAMINATION:  VITAL SIGNS: Filed Vitals:   03/10/16 0546 03/10/16 0900  BP: 158/70 164/65  Pulse: 81 82  Temp: 98.5 F (36.9 C) 98.7 F (37.1 C)  Resp: 84    GENERAL:64 y.o.female  currently in no acute distress.  HEAD: Normocephalic, atraumatic.  EYES: Pupils equal, round, reactive to light. Extraocular muscles intact. No scleral icterus.  MOUTH: Moist mucosal membrane. Dentition intact. No abscess noted.  EAR, NOSE, THROAT: Clear without exudates. No external lesions.  NECK: Supple. No thyromegaly. No nodules. No JVD.  PULMONARY: Clear to ascultation, without wheeze rails or rhonci. No use of accessory muscles, Good respiratory effort. good air entry bilaterally CHEST: Nontender to palpation.  CARDIOVASCULAR: S1 and S2. Regular rate and rhythm. No murmurs, rubs, or gallops. No edema. Pedal pulses 2+ bilaterally.  GASTROINTESTINAL: Soft, nontender, nondistended. No masses. Positive bowel sounds. No hepatosplenomegaly.  MUSCULOSKELETAL: No swelling, clubbing, or edema. Range of motion full in all extremities.  NEUROLOGIC: Cranial nerves II through XII are intact. No gross focal neurological deficits. Sensation intact. Reflexes intact.  SKIN: No ulceration, lesions, rashes, or cyanosis. Skin warm and dry. Turgor intact.  PSYCHIATRIC: Mood, affect within normal limits. The patient is awake, alert and oriented x 3. Insight, judgment intact.      LABORATORY PANEL:   CBC  Recent Labs Lab 03/10/16 0536  WBC 3.8  HGB 9.5*  HCT 27.3*  PLT 135*   ------------------------------------------------------------------------------------------------------------------  Chemistries   Recent Labs Lab 03/10/16 0536  NA 142  K 4.2  CL 112*  CO2 23  GLUCOSE 83  BUN 53*  CREATININE 2.28*  CALCIUM 8.6*  AST 73*  ALT 30  ALKPHOS 56  BILITOT 1.1   ------------------------------------------------------------------------------------------------------------------  Cardiac Enzymes  Recent Labs Lab 03/09/16 1408  TROPONINI <0.03   ------------------------------------------------------------------------------------------------------------------  RADIOLOGY:  No  results  found.  EKG:   Orders placed or performed during the hospital encounter of 03/09/16  . ED EKG  . ED EKG  . EKG 12-Lead  . EKG 12-Lead    ASSESSMENT AND PLAN:   65 year old Caucasian female admitted 03/09/2016 with anemia 1. Symptomatic anemia: Fecal occult blood test positive, GI consult pending, post transfusion and follow CBCs transfuse hemoglobin less than-continue Protonix 2. Type 2 diabetes insulin requiring:, Hypoglycemic this morning decreased Lantus and continue sliding scale coverage 3. Recent NSTEMI: Aspirin Plavix statin therapy 4. Essential hypertension lisinopril 5. Venous thromboembolic prophylactic: SCDs    All the records are reviewed and case discussed with Care Management/Social Workerr. Management plans discussed with the patient, family and they are in agreement.  CODE STATUS: Full  TOTAL TIME TAKING CARE OF THIS PATIENT: 33 minutes.   POSSIBLE D/C IN 2-3 DAYS, DEPENDING ON CLINICAL CONDITION.   Karmyn Lowman,  Karenann Cai.D on 03/10/2016 at 12:30 PM  Between 7am to 6pm - Pager - [phone]  After 6pm: House Pager: - [facility]  Office  [phone]  CC: Primary care physician; [name], MD

## 2016-03-10 NOTE — Progress Notes (Signed)
GI Note:  Full note to follow.    Long standing anemia requiring intermittent tranfusions.  MCV is in 90's.   This is likely anemia chronic disease and not IDA regardless of hemoccult but iron studies pending.   Recs: - added iron studies onto pre transfusion labs - safe for d/c once Hgb stable  - consider CT a/p for chronic gen abd pain and wt loss - can f/u in GI clinic for consideration of luminal eval even if iron studies not c/w IDA - continue plavix. - close outpt monitoring of HGb - hematology f/u.

## 2016-03-10 NOTE — Progress Notes (Signed)
DR Hower was made aware of pt drop in sugar after c/o not feeling good after eating lunch with fs 163 mg/dl earlier  and was given coverage as order, will continue to monitor , stated he will review pt sliding scale

## 2016-03-10 NOTE — Consult Note (Signed)
GI Inpatient Consult Note  Reason for Consult: severe anemia   Attending Requesting Consult: Vianne Bulls MD  History of Present Illness: [name] is a 65 y.o. female with PMHX MGUS, CAD, long standing anemia requiring intermittent transfusions, followed by hematology a/w severe anemia.  Presented to GI clinic day prior to admission for abd pain, wt loss, anemia and was found to have Hgb 7 so was sent to ED. Was admitted for tranfusion.   Also having generalized body and muscle pain.  Woke up last night in severe generalized muscle pain.   Has not seen blood in stool, no melena.   Reports last EGD and colon were many years ago. Anemia also very long standing. Denies any recent CT a/p.  No dysphagia. Occ GERD. Does have gen abd pain. Unsure how much wt she has lost, poor appetite.    Past Medical History:  Past Medical History  Diagnosis Date  . DIABETES MELLITUS, II, COMPLICATIONS 99991111  . HYPERLIPIDEMIA 02/27/2007  . 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 ANEMIA, OTHER, UNSPECIFIED 02/27/2007  . DEPRESSIVE DISORDER, NOS 02/27/2007  . HYPERTENSION, BENIGN SYSTEMIC 02/27/2007  . CORONARY, ARTERIOSCLEROSIS 02/27/2007  . TIA 05/17/2009  . GASTROESOPHAGEAL REFLUX, NO ESOPHAGITIS 02/27/2007  . RENAL INSUFFICIENCY, CHRONIC 12/28/2009  . Renal failure, acute on chronic (HCC) 01/25/2014  . Blood transfusion without reported diagnosis   . Anemia     Problem List: Patient Active Problem List   Diagnosis Date Noted  . Symptomatic anemia 03/09/2016  . Acute MI ([place]) 02/07/2016  . NSTEMI (non-ST elevated myocardial infarction) ([place]) 02/07/2016  . Chronic kidney disease (CKD), stage IV (severe) ([place]) 06/26/2015  . Chest pain with high risk for cardiac etiology 06/24/2015  . Low hemoglobin 02/02/2014  . Abdominal pain, epigastric 02/02/2014  . Black stools 02/02/2014  . Anemia 01/25/2014  . Renal failure, acute on chronic (HCC) 01/25/2014  . MGUS (monoclonal gammopathy of unknown  significance) 11/02/2013  . OSTEOARTHROS UNSPEC GEN/LOC OTH SPEC SITES 07/05/2010  . HEAD TRAUMA 05/04/2010  . CELLULITIS AND ABSCESS OF UNSPECIFIED SITE 05/02/2010  . INSOMNIA, CHRONIC 04/06/2010  . HYPERKALEMIA 03/02/2010  . LEG CRAMPS 02/28/2010  . GASTROENTERITIS WITHOUT DEHYDRATION 02/21/2010  . HAIR LOSS 01/31/2010  . PROCTITIS 12/29/2009  . Chronic kidney disease 12/28/2009  . PRURITUS 10/04/2009  . ULCER, RECTUM 09/28/2009  . UNSPECIFIED CONSTIPATION 09/17/2009  . 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 FATIGUE 08/05/2009  . ABDOMINAL PAIN, UNSPECIFIED SITE 08/05/2009  . TIA 05/17/2009  . 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 ANXIETY 06/30/2008  . URGENCY OF URINATION 06/30/2008  . DIABETES MELLITUS, II, COMPLICATIONS [id number]  . HYPERLIPIDEMIA 02/27/2007  . Anemia 02/27/2007  . DEPRESSIVE DISORDER, NOS 02/27/2007  . HYPERTENSION, BENIGN SYSTEMIC 02/27/2007  . CORONARY, ARTERIOSCLEROSIS 02/27/2007  . GASTROESOPHAGEAL REFLUX, NO ESOPHAGITIS 02/27/2007  . BACK PAIN, LOW 02/27/2007  . FIBROMYALGIA, FIBROMYOSITIS 02/27/2007    Past Surgical History: Past Surgical History  Procedure Laterality Date  . Partial hysterectomy    . Rectocele repair    . Bladder surgery      Tack    Allergies: Allergies  Allergen Reactions  . Hydralazine Hcl Swelling  . Bisphosphonates Rash  . Codeine Phosphate Nausea And Vomiting and Rash  . Penicillins Itching, Rash and Other (See Comments)    Has patient had a PCN reaction causing immediate rash, facial/tongue/throat swelling, SOB or lightheadedness with hypotension: No Has patient had a PCN reaction causing severe rash involving mucus membranes or skin necrosis: No Has patient  had a PCN reaction that required hospitalization No Has patient had a PCN reaction occurring within the last 10 years: No If all of the above answers are "NO", then may proceed with Cephalosporin use.  . Sulfa Antibiotics Itching and Rash     Home Medications: Prescriptions prior to admission  Medication Sig Dispense Refill Last Dose  . ALPRAZolam (XANAX) 1 MG tablet Take 0.5-1 mg by mouth 3 (three) times daily as needed for anxiety.    03/09/2016 at Unknown time  . aspirin 81 MG chewable tablet Chew 81 mg by mouth at bedtime.   03/08/2016 at 2200  . carvedilol (COREG) 3.125 MG tablet Take 1 tablet (3.125 mg total) by mouth 2 (two) times daily with a meal. 60 tablet 0 03/09/2016 at 1000  . Cholecalciferol (VITAMIN D3) 5000 units TABS Take 5,000 Units by mouth daily.   03/09/2016 at Unknown time  . Choline Fenofibrate (FENOFIBRIC ACID) 135 MG CPDR Take 135 mg by mouth at bedtime.    03/08/2016 at Unknown time  . clopidogrel (PLAVIX) 75 MG tablet Take 75 mg by mouth at bedtime.    03/08/2016 at 2200  . FLUoxetine (PROZAC) 20 MG capsule Take 20 mg by mouth daily.    03/09/2016 at Unknown time  . gabapentin (NEURONTIN) 300 MG capsule Take 600 mg by mouth 3 (three) times daily.    03/09/2016 at Unknown time  . Glucagon, rDNA, (GLUCAGON EMERGENCY IJ) Inject 1 mg as directed once as needed (for severe hypoglycemia).   Past Month at Unknown time  . insulin aspart (NOVOLOG) 100 UNIT/ML injection Inject 2-12 Units into the skin 3 (three) times daily with meals as needed for high blood sugar. Pt uses as needed per sliding scale.   03/08/2016 at Unknown time  . insulin glargine (LANTUS) 100 UNIT/ML injection Inject 12 Units into the skin daily.   03/09/2016 at Unknown time  . isosorbide mononitrate (IMDUR) 60 MG 24 hr tablet Take 1 tablet (60 mg total) by mouth daily. 30 tablet 0 03/09/2016 at Unknown time  . ketoconazole (NIZORAL) 2 % shampoo Apply 1 application topically 2 (two) times a week.   Past Week at Unknown time  . lisinopril (PRINIVIL,ZESTRIL) 2.5 MG tablet Take 2.5 mg by mouth at bedtime.   03/08/2016 at Unknown time  . nitroGLYCERIN (NITROSTAT) 0.4 MG SL tablet Place 0.4 mg under the tongue every 5 (five) minutes as needed for chest pain.    Past  Month at Unknown time  . pantoprazole (PROTONIX) 40 MG tablet Take 40 mg by mouth daily.   03/09/2016 at Unknown time  . promethazine (PHENERGAN) 25 MG tablet Take 25 mg by mouth every 6 (six) hours as needed for nausea or vomiting.    Past Week at Unknown time  . simvastatin (ZOCOR) 20 MG tablet Take 20 mg by mouth at bedtime.    03/08/2016 at Unknown time  . topiramate (TOPAMAX) 100 MG tablet Take 100 mg by mouth 2 (two) times daily.    03/09/2016 at Unknown time   Home medication reconciliation was completed with the patient.   Scheduled Inpatient Medications:   . sodium chloride  10 mL/hr Intravenous Once  . carvedilol  3.125 mg Oral BID WC  . cholecalciferol  5,000 Units Oral Daily  . clopidogrel  75 mg Oral QHS  . fenofibrate  160 mg Oral Daily  . FLUoxetine  20 mg Oral BID  . gabapentin  600 mg Oral TID  . insulin aspart  0-5 Units Subcutaneous  QHS  . insulin aspart  0-9 Units Subcutaneous TID WC  . [START ON 03/11/2016] insulin glargine  6 Units Subcutaneous Daily  . isosorbide mononitrate  60 mg Oral Daily  . [START ON 03/12/2016] ketoconazole  1 application Topical Once per day on Mon Thu  . lisinopril  2.5 mg Oral QHS  . pantoprazole (PROTONIX) IV  40 mg Intravenous Q12H  . simvastatin  20 mg Oral QHS  . topiramate  100 mg Oral BID    Continuous Inpatient Infusions:     PRN Inpatient Medications:  acetaminophen, ALPRAZolam, HYDROcodone-acetaminophen, nitroGLYCERIN, promethazine  Family History: family history includes CAD in her mother; Cancer in her mother and sister.  The patient's family history is negative for inflammatory bowel disorders, GI malignancy, or solid organ transplantation.  Social History:   reports that she has never smoked. She has never used smokeless tobacco. She reports that she does not drink alcohol or use illicit drugs.  Review of Systems: Constitutional: +wt loss Eyes: No changes in vision. ENT: No oral lesions, sore throat.  GI: see HPI.   Heme/Lymph: + easy bruising.  CV: +chest pain.  GU: No hematuria.  Integumentary: +rashes.  Neuro: No headaches.  Psych: + depression/anxiety.  Endocrine: No heat/cold intolerance.  Allergic/Immunologic: No urticaria.  Resp: No cough, SOB.  Musculoskeletal: + joint swelling.    Physical Examination: BP 161/59 mmHg  Pulse 85  Temp(Src) 98.8 F (37.1 C) (Oral)  Resp 18  Ht 5\' 3"  (1.6 m)  Wt 52.164 kg (115 lb)  BMI 20.38 kg/m2  SpO2 99% Gen: NAD, alert and oriented x 4, flat affect, appears chronically ill HEENT: PEERLA, EOMI, Neck: supple, no JVD or thyromegaly Chest: CTA bilaterally, no wheezes, crackles, or other adventitious sounds CV: RRR, no m/g/c/r Abd: soft, NT, ND, +BS in all four quadrants; no HSM, guarding, ridigity, or rebound tenderness Ext: no edema, well perfused with 2+ pulses, Skin: no rash or lesions noted Lymph: no LAD  Data: Lab Results  Component Value Date   WBC 3.8 03/10/2016   HGB 9.5* 03/10/2016   HCT 27.3* 03/10/2016   MCV 93.0 03/10/2016   PLT 135* 03/10/2016    Recent Labs Lab 03/09/16 1408 03/09/16 1908 03/10/16 0536  HGB 6.7* 8.9* 9.5*   Lab Results  Component Value Date   NA 142 03/10/2016   K 4.2 03/10/2016   CL 112* 03/10/2016   CO2 23 03/10/2016   BUN 53* 03/10/2016   CREATININE 2.28* 03/10/2016   GLU 51* 01/19/2011   Lab Results  Component Value Date   ALT 30 03/10/2016   AST 73* 03/10/2016   ALKPHOS 56 03/10/2016   BILITOT 1.1 03/10/2016    Recent Labs Lab 03/09/16 1408  INR 1.15   Assessment/Plan: Ms. Geer is a 65 y.o. female a/w anemia, wt loss, gen abd pain and gen body pain.  Iron studies are not at all c/w IDA. Ferritin very elevated and iron,%sat, TIBC all c/w anemia of chronic disease. This is not blood loss anemia.    Recommendations: - safe for d/c from GI standpoint once Hgb stable - CT a/p without contrast for weight loss, gen abd pain, anorexia - consider auto-immne w/u for gen muscle / body  pain - can f/u in GI clinic for consideration of colonoscopy for screening.   Thank you for the consult. Please call with questions or concerns.  Daveigh Batty, Grace Blight, MD

## 2016-03-10 NOTE — Progress Notes (Signed)
Initial Nutrition Assessment       INTERVENTION:  Meals and snacks: Pt may benefit from discontinuing renal restriction and keeping carb modified diet as pt not dialysis pt    NUTRITION DIAGNOSIS:    (reassess at next visit) related to   as evidenced by  .    GOAL:   Patient will meet greater than or equal to 90% of their needs    MONITOR:    (Energy intake, Glucose profile, Electrolyte and renal profile)  REASON FOR ASSESSMENT:    (renal diet order)    ASSESSMENT:      Pt admitted with weakness, anemia. GI consulted  Past Medical History  Diagnosis Date  . DIABETES MELLITUS, II, COMPLICATIONS 99991111  . HYPERLIPIDEMIA 02/27/2007  . 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 ANEMIA, OTHER, UNSPECIFIED 02/27/2007  . DEPRESSIVE DISORDER, NOS 02/27/2007  . HYPERTENSION, BENIGN SYSTEMIC 02/27/2007  . CORONARY, ARTERIOSCLEROSIS 02/27/2007  . TIA 05/17/2009  . GASTROESOPHAGEAL REFLUX, NO ESOPHAGITIS 02/27/2007  . RENAL INSUFFICIENCY, CHRONIC 12/28/2009  . Renal failure, acute on chronic (HCC) 01/25/2014  . Blood transfusion without reported diagnosis   . Anemia     Current Nutrition: ate most of lunch today per pt  Food/Nutrition-Related History: pt reports normal appetite prior to admission   Scheduled Medications:  . sodium chloride  10 mL/hr Intravenous Once  . carvedilol  3.125 mg Oral BID WC  . cholecalciferol  5,000 Units Oral Daily  . clopidogrel  75 mg Oral QHS  . fenofibrate  160 mg Oral Daily  . FLUoxetine  20 mg Oral BID  . gabapentin  600 mg Oral TID  . insulin aspart  0-5 Units Subcutaneous QHS  . insulin aspart  0-9 Units Subcutaneous TID WC  . [START ON 03/11/2016] insulin glargine  6 Units Subcutaneous Daily  . isosorbide mononitrate  60 mg Oral Daily  . [START ON 03/12/2016] ketoconazole  1 application Topical Once per day on Mon Thu  . lisinopril  2.5 mg Oral QHS  . pantoprazole (PROTONIX) IV  40 mg Intravenous Q12H  . simvastatin  20 mg Oral QHS   . topiramate  100 mg Oral BID        Electrolyte/Renal Profile and Glucose Profile:   Recent Labs Lab 03/09/16 1408 03/10/16 0536  NA 142 142  K 5.4* 4.2  CL 119* 112*  CO2 18* 23  BUN 50* 53*  CREATININE 1.99* 2.28*  CALCIUM 8.3* 8.6*  GLUCOSE 92 83   Protein Profile:  Recent Labs Lab 03/09/16 1408 03/10/16 0536  ALBUMIN 3.1* 3.0*    Gastrointestinal Profile: Last BM: wDL      Weight Change: stable wt    Diet Order:  Diet renal/carb modified with fluid restriction Diet-HS Snack?: Nothing; Room service appropriate?: Yes; Fluid consistency:: Thin  Skin:   reviewed     Height:   Ht Readings from Last 1 Encounters:  03/09/16 5\' 3"  (1.6 m)    Weight:   Wt Readings from Last 1 Encounters:  03/09/16 115 lb (52.164 kg)    Ideal Body Weight:     BMI:  Body mass index is 20.38 kg/(m^2).  EDUCATION NEEDS:   No education needs identified at this time  LOW Care Level  Dearia Wilmouth B. Zenia Resides, [place], [place] (pager) Weekend/On-Call pager [phone])

## 2016-03-10 NOTE — Progress Notes (Signed)
Pt c/o pain all over, tylenol not helping. Spoke with dr.willis and orders given.

## 2016-03-11 ENCOUNTER — Inpatient Hospital Stay: Payer: Commercial Managed Care - HMO

## 2016-03-11 LAB — GLUCOSE, CAPILLARY
Glucose-Capillary: 244 mg/dL — ABNORMAL HIGH (ref 65–99)
Glucose-Capillary: 281 mg/dL — ABNORMAL HIGH (ref 65–99)

## 2016-03-11 LAB — CBC
HEMATOCRIT: 25.3 % — AB (ref 35.0–47.0)
Hemoglobin: 8.6 g/dL — ABNORMAL LOW (ref 12.0–16.0)
MCH: 31.2 pg (ref 26.0–34.0)
MCHC: 33.9 g/dL (ref 32.0–36.0)
MCV: 92.1 fL (ref 80.0–100.0)
PLATELETS: 129 10*3/uL — AB (ref 150–440)
RBC: 2.75 MIL/uL — AB (ref 3.80–5.20)
RDW: 16.2 % — ABNORMAL HIGH (ref 11.5–14.5)
WBC: 4.5 10*3/uL (ref 3.6–11.0)

## 2016-03-11 LAB — TYPE AND SCREEN
ABO/RH(D): O POS
ANTIBODY SCREEN: NEGATIVE
Unit division: 0

## 2016-03-11 MED ORDER — HYDROCODONE-ACETAMINOPHEN 5-325 MG PO TABS: 1.0000 | ORAL_TABLET | ORAL | Status: DC | PRN

## 2016-03-11 MED ORDER — IOHEXOL 240 MG/ML SOLN
25.0000 mL | INTRAMUSCULAR | Status: AC
  Administered 2016-03-11 (×2): 25 mL via ORAL

## 2016-03-11 NOTE — Discharge Summary (Signed)
[facility] [place] NAME: [name]    MR#:  [id number]  DATE OF BIRTH:  26-May-1951  DATE OF ADMISSION:  03/09/2016 ADMITTING PHYSICIAN: Epifanio Lesches, MD  DATE OF DISCHARGE: No discharge date for patient encounter.  PRIMARY CARE PHYSICIAN: [name], MD    ADMISSION DIAGNOSIS:  Fatigue Symptomatic anemia  DISCHARGE DIAGNOSIS:  Anemia chronic disease  SECONDARY DIAGNOSIS:   Past Medical History  Diagnosis Date  . DIABETES MELLITUS, II, COMPLICATIONS 99991111  . HYPERLIPIDEMIA 02/27/2007  . 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 ANEMIA, OTHER, UNSPECIFIED 02/27/2007  . DEPRESSIVE DISORDER, NOS 02/27/2007  . HYPERTENSION, BENIGN SYSTEMIC 02/27/2007  . CORONARY, ARTERIOSCLEROSIS 02/27/2007  . TIA 05/17/2009  . GASTROESOPHAGEAL REFLUX, NO ESOPHAGITIS 02/27/2007  . RENAL INSUFFICIENCY, CHRONIC 12/28/2009  . Renal failure, acute on chronic (HCC) 01/25/2014  . Blood transfusion without reported diagnosis   . Anemia     HOSPITAL COURSE:  [name]  is a 65 y.o. female admitted 03/09/2016 with chief complaint fatigue. Found to be anemic received 1 unit of packed red blood cell transfusion with appropriate response. Noted to be fecal occult blood test positive-gastroneurology consult at no luminal evaluation performed during this hospitalization blood tests most consistent with anemia of chronic disease given ferritin and iron levels. Her symptoms have improved after transfusion she is being discharged in stable condition  DISCHARGE CONDITIONS:   Stable/improved  CONSULTS OBTAINED:  Treatment Team:  Josefine Class, MD  DRUG ALLERGIES:   Allergies  Allergen Reactions  . Hydralazine Hcl Swelling  . Bisphosphonates Rash  . Codeine Phosphate Nausea And Vomiting and Rash  . Penicillins Itching, Rash and Other (See Comments)    Has patient had a PCN reaction causing immediate rash, facial/tongue/throat  swelling, SOB or lightheadedness with hypotension: No Has patient had a PCN reaction causing severe rash involving mucus membranes or skin necrosis: No Has patient had a PCN reaction that required hospitalization No Has patient had a PCN reaction occurring within the last 10 years: No If all of the above answers are "NO", then may proceed with Cephalosporin use.  . Sulfa Antibiotics Itching and Rash    DISCHARGE MEDICATIONS:   Current Discharge Medication List    START taking these medications   Details  HYDROcodone-acetaminophen (NORCO/VICODIN) 5-325 MG tablet Take 1 tablet by mouth every 4 (four) hours as needed for moderate pain. Qty: 30 tablet, Refills: 0      CONTINUE these medications which have NOT CHANGED   Details  ALPRAZolam (XANAX) 1 MG tablet Take 0.5-1 mg by mouth 3 (three) times daily as needed for anxiety.     aspirin 81 MG chewable tablet Chew 81 mg by mouth at bedtime.    carvedilol (COREG) 3.125 MG tablet Take 1 tablet (3.125 mg total) by mouth 2 (two) times daily with a meal. Qty: 60 tablet, Refills: 0    Cholecalciferol (VITAMIN D3) 5000 units TABS Take 5,000 Units by mouth daily.    Choline Fenofibrate (FENOFIBRIC ACID) 135 MG CPDR Take 135 mg by mouth at bedtime.     clopidogrel (PLAVIX) 75 MG tablet Take 75 mg by mouth at bedtime.     FLUoxetine (PROZAC) 20 MG capsule Take 20 mg by mouth daily.     gabapentin (NEURONTIN) 300 MG capsule Take 600 mg by mouth 3 (three) times daily.     Glucagon, rDNA, (GLUCAGON EMERGENCY IJ) Inject 1 mg as directed once as needed (for severe hypoglycemia).  insulin aspart (NOVOLOG) 100 UNIT/ML injection Inject 2-12 Units into the skin 3 (three) times daily with meals as needed for high blood sugar. Pt uses as needed per sliding scale.    insulin glargine (LANTUS) 100 UNIT/ML injection Inject 12 Units into the skin daily.    isosorbide mononitrate (IMDUR) 60 MG 24 hr tablet Take 1 tablet (60 mg total) by mouth  daily. Qty: 30 tablet, Refills: 0    ketoconazole (NIZORAL) 2 % shampoo Apply 1 application topically 2 (two) times a week.    lisinopril (PRINIVIL,ZESTRIL) 2.5 MG tablet Take 2.5 mg by mouth at bedtime.    nitroGLYCERIN (NITROSTAT) 0.4 MG SL tablet Place 0.4 mg under the tongue every 5 (five) minutes as needed for chest pain.     pantoprazole (PROTONIX) 40 MG tablet Take 40 mg by mouth daily.    promethazine (PHENERGAN) 25 MG tablet Take 25 mg by mouth every 6 (six) hours as needed for nausea or vomiting.     simvastatin (ZOCOR) 20 MG tablet Take 20 mg by mouth at bedtime.     topiramate (TOPAMAX) 100 MG tablet Take 100 mg by mouth 2 (two) times daily.          DISCHARGE INSTRUCTIONS:    DIET:  Diabetic diet  DISCHARGE CONDITION:  Good  ACTIVITY:  Activity as tolerated  OXYGEN:  Home Oxygen: No.   Oxygen Delivery: room air  DISCHARGE LOCATION:  home   If you experience worsening of your admission symptoms, develop shortness of breath, life threatening emergency, suicidal or homicidal thoughts you must seek medical attention immediately by calling 911 or calling your MD immediately  if symptoms less severe.  You Must read complete instructions/literature along with all the possible adverse reactions/side effects for all the Medicines you take and that have been prescribed to you. Take any new Medicines after you have completely understood and accpet all the possible adverse reactions/side effects.   Please note  You were cared for by a hospitalist during your hospital stay. If you have any questions about your discharge medications or the care you received while you were in the hospital after you are discharged, you can call the unit and asked to speak with the hospitalist on call if the hospitalist that took care of you is not available. Once you are discharged, your primary care physician will handle any further medical issues. Please note that NO REFILLS for any  discharge medications will be authorized once you are discharged, as it is imperative that you return to your primary care physician (or establish a relationship with a primary care physician if you do not have one) for your aftercare needs so that they can reassess your need for medications and monitor your lab values.    On the day of Discharge:   VITAL SIGNS:  Blood pressure 167/66, pulse 77, temperature 98.8 F (37.1 C), temperature source Oral, resp. rate 18, height 5\' 3"  (1.6 m), weight 52.164 kg (115 lb), SpO2 98 %.  I/O:   Intake/Output Summary (Last 24 hours) at 03/11/16 1129 Last data filed at 03/11/16 0909  Gross per 24 hour  Intake    953 ml  Output    600 ml  Net    353 ml    PHYSICAL EXAMINATION:  GENERAL:  65 y.o.-year-old patient lying in the bed with no acute distress.  EYES: Pupils equal, round, reactive to light and accommodation. No scleral icterus. Extraocular muscles intact.  HEENT: Head atraumatic, normocephalic. Oropharynx and  nasopharynx clear.  NECK:  Supple, no jugular venous distention. No thyroid enlargement, no tenderness.  LUNGS: Normal breath sounds bilaterally, no wheezing, rales,rhonchi or crepitation. No use of accessory muscles of respiration.  CARDIOVASCULAR: S1, S2 normal. No murmurs, rubs, or gallops.  ABDOMEN: Soft, non-tender, non-distended. Bowel sounds present. No organomegaly or mass.  EXTREMITIES: No pedal edema, cyanosis, or clubbing.  NEUROLOGIC: Cranial nerves II through XII are intact. Muscle strength 5/5 in all extremities. Sensation intact. Gait not checked.  PSYCHIATRIC: The patient is alert and oriented x 3.  SKIN: No obvious rash, lesion, or ulcer.   DATA REVIEW:   CBC  Recent Labs Lab 03/11/16 0515  WBC 4.5  HGB 8.6*  HCT 25.3*  PLT 129*    Chemistries   Recent Labs Lab 03/10/16 0536  NA 142  K 4.2  CL 112*  CO2 23  GLUCOSE 83  BUN 53*  CREATININE 2.28*  CALCIUM 8.6*  AST 73*  ALT 30  ALKPHOS 56   BILITOT 1.1    Cardiac Enzymes  Recent Labs Lab 03/09/16 1408  TROPONINI <0.03    Microbiology Results  Results for orders placed or performed during the hospital encounter of 02/07/16  Urine culture     Status: None   Collection Time: 02/08/16  9:32 PM  Result Value Ref Range Status   Specimen Description URINE, RANDOM  Final   Special Requests NONE  Final   Culture MULTIPLE SPECIES PRESENT, SUGGEST RECOLLECTION  Final   Report Status 02/10/2016 FINAL  Final    RADIOLOGY:  Ct Abdomen Pelvis Wo Contrast  03/11/2016  CLINICAL DATA:  Generalized abdominopelvic pain. Bloody stool. Weight loss. EXAM: CT ABDOMEN AND PELVIS WITHOUT CONTRAST TECHNIQUE: Multidetector CT imaging of the abdomen and pelvis was performed following the standard protocol without IV contrast. COMPARISON:  None. FINDINGS: Lower chest: Trace left pleural effusion. Minimal patchy opacity along the medial left lower lobe. Hepatobiliary: Liver is within normal limits. No intrahepatic or extrahepatic ductal dilatation. Gallbladder is unremarkable. No intrahepatic or extrahepatic ductal dilatation. Pancreas: Within normal limits. Spleen: Within normal limits Adrenals/Urinary Tract: Adrenal glands are within normal limits. Kidneys are within normal limits. No renal, ureteral, or bladder calculi. No hydronephrosis. Bladder is mildly thick-walled although underdistended. Stomach/Bowel: Stomach is within normal limits. No evidence of bowel obstruction. Normal appendix. Moderate left colonic stool burden, suggesting constipation, with mild fecal impaction in the rectum (series 2/image 68). Vascular/Lymphatic: No evidence of abdominal aortic aneurysm. Atherosclerotic calcifications of the abdominal aorta and branch vessels. No suspicious abdominopelvic lymphadenopathy. Reproductive: Status post hysterectomy. No adnexal masses. Other: No abdominopelvic ascites. Musculoskeletal: Visualized osseous structures are within normal limits.  IMPRESSION: No evidence of bowel obstruction.  Normal appendix. Moderate left colonic stool burden, suggesting constipation, with mild rectal fecal impaction. Trace left pleural effusion. Electronically Signed   By: Julian Hy M.D.   On: 03/11/2016 11:00     Management plans discussed with the patient, family and they are in agreement.  CODE STATUS:     Code Status Orders        Start     Ordered   03/09/16 1659  Full code   Continuous     03/09/16 1700    Code Status History    Date Active Date Inactive Code Status Order ID Comments User Context   02/07/2016 11:07 PM 02/11/2016  3:32 PM Full Code [id number]  Vaughan Basta, MD Inpatient   06/23/2015  8:50 PM 06/26/2015  5:10 PM Full Code [id number]  Bettey Costa, MD Inpatient      TOTAL TIME TAKING CARE OF THIS PATIENT: 28 minutes.    Hower,  Karenann Cai.D on 03/11/2016 at 11:29 AM  Between 7am to 6pm - Pager - [phone]  After 6pm go to [email or web address] - password [email or web address] [facility]  [facility]  Office  [phone]  CC: Primary care physician; [name], MD

## 2016-03-11 NOTE — Discharge Instructions (Signed)
*  Notify your doctor if your symptoms return and or worsen.  *Take all medications as prescribed.  *Notify your doctor with any questions or concerns.

## 2016-03-11 NOTE — Progress Notes (Signed)
Pt has just started second bottle of contrast.

## 2016-04-04 ENCOUNTER — Inpatient Hospital Stay: Payer: Commercial Managed Care - HMO

## 2016-04-16 ENCOUNTER — Inpatient Hospital Stay: Payer: Commercial Managed Care - HMO

## 2016-04-16 ENCOUNTER — Inpatient Hospital Stay: Payer: Commercial Managed Care - HMO | Attending: Oncology

## 2016-04-16 ENCOUNTER — Inpatient Hospital Stay: Payer: Commercial Managed Care - HMO | Admitting: Oncology

## 2016-06-05 ENCOUNTER — Emergency Department (HOSPITAL_COMMUNITY): Payer: Commercial Managed Care - HMO

## 2016-06-05 ENCOUNTER — Encounter (HOSPITAL_COMMUNITY): Payer: Self-pay | Admitting: Emergency Medicine

## 2016-06-05 ENCOUNTER — Inpatient Hospital Stay (HOSPITAL_COMMUNITY)
Admission: EM | Admit: 2016-06-05 | Discharge: 2016-06-11 | DRG: 469 | Disposition: A | Discharge status: Skilled Nursing Facility | Payer: Commercial Managed Care - HMO | Attending: Family Medicine | Admitting: Family Medicine

## 2016-06-05 DIAGNOSIS — Z79899 Other long term (current) drug therapy: Secondary | ICD-10-CM | POA: Diagnosis not present

## 2016-06-05 DIAGNOSIS — Z8249 Family history of ischemic heart disease and other diseases of the circulatory system: Secondary | ICD-10-CM

## 2016-06-05 DIAGNOSIS — Z794 Long term (current) use of insulin: Secondary | ICD-10-CM

## 2016-06-05 DIAGNOSIS — Z9181 History of falling: Secondary | ICD-10-CM

## 2016-06-05 DIAGNOSIS — Z955 Presence of coronary angioplasty implant and graft: Secondary | ICD-10-CM | POA: Diagnosis not present

## 2016-06-05 DIAGNOSIS — Z809 Family history of malignant neoplasm, unspecified: Secondary | ICD-10-CM

## 2016-06-05 DIAGNOSIS — E1122 Type 2 diabetes mellitus with diabetic chronic kidney disease: Secondary | ICD-10-CM | POA: Diagnosis present

## 2016-06-05 DIAGNOSIS — E8889 Other specified metabolic disorders: Secondary | ICD-10-CM | POA: Diagnosis present

## 2016-06-05 DIAGNOSIS — R29898 Other symptoms and signs involving the musculoskeletal system: Secondary | ICD-10-CM | POA: Diagnosis not present

## 2016-06-05 DIAGNOSIS — R7989 Other specified abnormal findings of blood chemistry: Secondary | ICD-10-CM | POA: Diagnosis not present

## 2016-06-05 DIAGNOSIS — D638 Anemia in other chronic diseases classified elsewhere: Secondary | ICD-10-CM | POA: Diagnosis present

## 2016-06-05 DIAGNOSIS — S0083XA Contusion of other part of head, initial encounter: Secondary | ICD-10-CM | POA: Diagnosis present

## 2016-06-05 DIAGNOSIS — W1830XA Fall on same level, unspecified, initial encounter: Secondary | ICD-10-CM | POA: Diagnosis present

## 2016-06-05 DIAGNOSIS — E1310 Other specified diabetes mellitus with ketoacidosis without coma: Secondary | ICD-10-CM | POA: Diagnosis not present

## 2016-06-05 DIAGNOSIS — Z885 Allergy status to narcotic agent status: Secondary | ICD-10-CM | POA: Diagnosis not present

## 2016-06-05 DIAGNOSIS — Z7902 Long term (current) use of antithrombotics/antiplatelets: Secondary | ICD-10-CM | POA: Diagnosis not present

## 2016-06-05 DIAGNOSIS — R42 Dizziness and giddiness: Secondary | ICD-10-CM | POA: Diagnosis present

## 2016-06-05 DIAGNOSIS — N390 Urinary tract infection, site not specified: Secondary | ICD-10-CM | POA: Diagnosis present

## 2016-06-05 DIAGNOSIS — E109 Type 1 diabetes mellitus without complications: Secondary | ICD-10-CM | POA: Diagnosis not present

## 2016-06-05 DIAGNOSIS — R0681 Apnea, not elsewhere classified: Secondary | ICD-10-CM | POA: Diagnosis not present

## 2016-06-05 DIAGNOSIS — I25118 Atherosclerotic heart disease of native coronary artery with other forms of angina pectoris: Secondary | ICD-10-CM | POA: Diagnosis not present

## 2016-06-05 DIAGNOSIS — I4581 Long QT syndrome: Secondary | ICD-10-CM | POA: Diagnosis present

## 2016-06-05 DIAGNOSIS — F419 Anxiety disorder, unspecified: Secondary | ICD-10-CM | POA: Diagnosis present

## 2016-06-05 DIAGNOSIS — R296 Repeated falls: Secondary | ICD-10-CM | POA: Diagnosis present

## 2016-06-05 DIAGNOSIS — H9193 Unspecified hearing loss, bilateral: Secondary | ICD-10-CM | POA: Diagnosis present

## 2016-06-05 DIAGNOSIS — S0990XA Unspecified injury of head, initial encounter: Secondary | ICD-10-CM

## 2016-06-05 DIAGNOSIS — R251 Tremor, unspecified: Secondary | ICD-10-CM | POA: Diagnosis present

## 2016-06-05 DIAGNOSIS — D62 Acute posthemorrhagic anemia: Secondary | ICD-10-CM | POA: Diagnosis not present

## 2016-06-05 DIAGNOSIS — G9340 Encephalopathy, unspecified: Secondary | ICD-10-CM | POA: Diagnosis not present

## 2016-06-05 DIAGNOSIS — Z6824 Body mass index (BMI) 24.0-24.9, adult: Secondary | ICD-10-CM | POA: Diagnosis not present

## 2016-06-05 DIAGNOSIS — K219 Gastro-esophageal reflux disease without esophagitis: Secondary | ICD-10-CM | POA: Diagnosis present

## 2016-06-05 DIAGNOSIS — E785 Hyperlipidemia, unspecified: Secondary | ICD-10-CM | POA: Diagnosis present

## 2016-06-05 DIAGNOSIS — J9601 Acute respiratory failure with hypoxia: Secondary | ICD-10-CM | POA: Diagnosis not present

## 2016-06-05 DIAGNOSIS — W19XXXA Unspecified fall, initial encounter: Secondary | ICD-10-CM

## 2016-06-05 DIAGNOSIS — M797 Fibromyalgia: Secondary | ICD-10-CM | POA: Diagnosis not present

## 2016-06-05 DIAGNOSIS — Z880 Allergy status to penicillin: Secondary | ICD-10-CM

## 2016-06-05 DIAGNOSIS — S72001A Fracture of unspecified part of neck of right femur, initial encounter for closed fracture: Secondary | ICD-10-CM | POA: Diagnosis present

## 2016-06-05 DIAGNOSIS — Z888 Allergy status to other drugs, medicaments and biological substances status: Secondary | ICD-10-CM

## 2016-06-05 DIAGNOSIS — D649 Anemia, unspecified: Secondary | ICD-10-CM

## 2016-06-05 DIAGNOSIS — I2510 Atherosclerotic heart disease of native coronary artery without angina pectoris: Secondary | ICD-10-CM | POA: Diagnosis present

## 2016-06-05 DIAGNOSIS — N184 Chronic kidney disease, stage 4 (severe): Secondary | ICD-10-CM | POA: Diagnosis present

## 2016-06-05 DIAGNOSIS — M62838 Other muscle spasm: Secondary | ICD-10-CM | POA: Diagnosis present

## 2016-06-05 DIAGNOSIS — Z7982 Long term (current) use of aspirin: Secondary | ICD-10-CM

## 2016-06-05 DIAGNOSIS — S72009A Fracture of unspecified part of neck of unspecified femur, initial encounter for closed fracture: Secondary | ICD-10-CM | POA: Diagnosis present

## 2016-06-05 DIAGNOSIS — R4182 Altered mental status, unspecified: Secondary | ICD-10-CM | POA: Diagnosis not present

## 2016-06-05 DIAGNOSIS — N179 Acute kidney failure, unspecified: Secondary | ICD-10-CM | POA: Diagnosis not present

## 2016-06-05 DIAGNOSIS — I129 Hypertensive chronic kidney disease with stage 1 through stage 4 chronic kidney disease, or unspecified chronic kidney disease: Secondary | ICD-10-CM | POA: Diagnosis present

## 2016-06-05 DIAGNOSIS — E43 Unspecified severe protein-calorie malnutrition: Secondary | ICD-10-CM | POA: Diagnosis present

## 2016-06-05 DIAGNOSIS — Z79891 Long term (current) use of opiate analgesic: Secondary | ICD-10-CM | POA: Diagnosis not present

## 2016-06-05 DIAGNOSIS — E0810 Diabetes mellitus due to underlying condition with ketoacidosis without coma: Secondary | ICD-10-CM | POA: Diagnosis not present

## 2016-06-05 DIAGNOSIS — S0093XA Contusion of unspecified part of head, initial encounter: Secondary | ICD-10-CM

## 2016-06-05 DIAGNOSIS — E119 Type 2 diabetes mellitus without complications: Secondary | ICD-10-CM | POA: Diagnosis not present

## 2016-06-05 DIAGNOSIS — F329 Major depressive disorder, single episode, unspecified: Secondary | ICD-10-CM | POA: Diagnosis present

## 2016-06-05 DIAGNOSIS — D696 Thrombocytopenia, unspecified: Secondary | ICD-10-CM | POA: Diagnosis present

## 2016-06-05 DIAGNOSIS — Z8673 Personal history of transient ischemic attack (TIA), and cerebral infarction without residual deficits: Secondary | ICD-10-CM | POA: Diagnosis not present

## 2016-06-05 DIAGNOSIS — I25119 Atherosclerotic heart disease of native coronary artery with unspecified angina pectoris: Secondary | ICD-10-CM | POA: Diagnosis not present

## 2016-06-05 DIAGNOSIS — Z882 Allergy status to sulfonamides status: Secondary | ICD-10-CM | POA: Diagnosis not present

## 2016-06-05 DIAGNOSIS — I340 Nonrheumatic mitral (valve) insufficiency: Secondary | ICD-10-CM | POA: Diagnosis not present

## 2016-06-05 DIAGNOSIS — R1310 Dysphagia, unspecified: Secondary | ICD-10-CM

## 2016-06-05 DIAGNOSIS — A419 Sepsis, unspecified organism: Secondary | ICD-10-CM | POA: Diagnosis not present

## 2016-06-05 DIAGNOSIS — F32A Depression, unspecified: Secondary | ICD-10-CM | POA: Diagnosis present

## 2016-06-05 DIAGNOSIS — S72001D Fracture of unspecified part of neck of right femur, subsequent encounter for closed fracture with routine healing: Secondary | ICD-10-CM | POA: Diagnosis not present

## 2016-06-05 DIAGNOSIS — E876 Hypokalemia: Secondary | ICD-10-CM | POA: Diagnosis not present

## 2016-06-05 DIAGNOSIS — J810 Acute pulmonary edema: Secondary | ICD-10-CM | POA: Diagnosis not present

## 2016-06-05 DIAGNOSIS — E872 Acidosis: Secondary | ICD-10-CM | POA: Diagnosis not present

## 2016-06-05 DIAGNOSIS — K5900 Constipation, unspecified: Secondary | ICD-10-CM

## 2016-06-05 LAB — COMPREHENSIVE METABOLIC PANEL
ALT: 24 U/L (ref 14–54)
AST: 28 U/L (ref 15–41)
Albumin: 3 g/dL — ABNORMAL LOW (ref 3.5–5.0)
Alkaline Phosphatase: 45 U/L (ref 38–126)
Anion gap: 7 (ref 5–15)
BILIRUBIN TOTAL: 0.6 mg/dL (ref 0.3–1.2)
BUN: 50 mg/dL — AB (ref 6–20)
CALCIUM: 8.9 mg/dL (ref 8.9–10.3)
CO2: 23 mmol/L (ref 22–32)
CREATININE: 2.08 mg/dL — AB (ref 0.44–1.00)
Chloride: 111 mmol/L (ref 101–111)
GFR calc Af Amer: 27 mL/min — ABNORMAL LOW (ref >60)
GFR, EST NON AFRICAN AMERICAN: 24 mL/min — AB (ref >60)
Glucose, Bld: 130 mg/dL — ABNORMAL HIGH (ref 65–99)
POTASSIUM: 4.4 mmol/L (ref 3.5–5.1)
Sodium: 141 mmol/L (ref 135–145)
TOTAL PROTEIN: 5.4 g/dL — AB (ref 6.5–8.1)

## 2016-06-05 LAB — CBC WITH DIFFERENTIAL/PLATELET
BASOS ABS: 0 10*3/uL (ref 0.0–0.1)
BASOS PCT: 0 %
EOS ABS: 0.1 10*3/uL (ref 0.0–0.7)
EOS PCT: 4 %
HCT: 22.6 % — ABNORMAL LOW (ref 36.0–46.0)
Hemoglobin: 7.3 g/dL — ABNORMAL LOW (ref 12.0–15.0)
Lymphocytes Relative: 23 %
Lymphs Abs: 0.9 10*3/uL (ref 0.7–4.0)
MCH: 31.1 pg (ref 26.0–34.0)
MCHC: 32.3 g/dL (ref 30.0–36.0)
MCV: 96.2 fL (ref 78.0–100.0)
MONO ABS: 0.2 10*3/uL (ref 0.1–1.0)
Monocytes Relative: 6 %
Neutro Abs: 2.5 10*3/uL (ref 1.7–7.7)
Neutrophils Relative %: 67 %
Platelets: 170 10*3/uL (ref 150–400)
RBC: 2.35 MIL/uL — AB (ref 3.87–5.11)
RDW: 14.4 % (ref 11.5–15.5)
WBC: 3.7 10*3/uL — AB (ref 4.0–10.5)

## 2016-06-05 LAB — PROTIME-INR
INR: 1.25 (ref 0.00–1.49)
PROTHROMBIN TIME: 15.9 s — AB (ref 11.6–15.2)

## 2016-06-05 LAB — I-STAT TROPONIN, ED: TROPONIN I, POC: 0.01 ng/mL (ref 0.00–0.08)

## 2016-06-05 LAB — CBG MONITORING, ED: Glucose-Capillary: 136 mg/dL — ABNORMAL HIGH (ref 65–99)

## 2016-06-05 LAB — PREPARE RBC (CROSSMATCH)

## 2016-06-05 MED ORDER — SODIUM CHLORIDE 0.9 % IV SOLN
10.0000 mL/h | Freq: Once | INTRAVENOUS | Status: AC
  Administered 2016-06-05: 10 mL/h via INTRAVENOUS

## 2016-06-05 MED ORDER — SODIUM CHLORIDE 0.9 % IV SOLN
INTRAVENOUS | Status: DC
Start: 2016-06-05 — End: 2016-06-06
  Administered 2016-06-05: 19:00:00 via INTRAVENOUS

## 2016-06-05 MED ORDER — FENTANYL CITRATE (PF) 100 MCG/2ML IJ SOLN
50.0000 ug | INTRAMUSCULAR | Status: AC | PRN
  Administered 2016-06-05 (×2): 50 ug via INTRAVENOUS
  Filled 2016-06-05 (×2): qty 2

## 2016-06-05 MED ORDER — ONDANSETRON HCL 4 MG/2ML IJ SOLN
4.0000 mg | Freq: Once | INTRAMUSCULAR | Status: AC
  Administered 2016-06-05: 4 mg via INTRAVENOUS
  Filled 2016-06-05: qty 2

## 2016-06-05 MED ORDER — MORPHINE SULFATE (PF) 4 MG/ML IV SOLN
4.0000 mg | Freq: Once | INTRAVENOUS | Status: AC
  Administered 2016-06-05: 4 mg via INTRAVENOUS
  Filled 2016-06-05: qty 1

## 2016-06-05 MED ORDER — DIPHENHYDRAMINE HCL 50 MG/ML IJ SOLN
25.0000 mg | Freq: Once | INTRAMUSCULAR | Status: AC
  Administered 2016-06-05: 25 mg via INTRAVENOUS
  Filled 2016-06-05: qty 1

## 2016-06-05 NOTE — ED Notes (Signed)
Pt transported to radiology.

## 2016-06-05 NOTE — ED Notes (Signed)
INR clicked off in error. Will collect when pt arrives back from radiology.

## 2016-06-05 NOTE — ED Notes (Signed)
Family at bedside. 

## 2016-06-05 NOTE — ED Notes (Signed)
Pt from home with a fall today c/o of right hip and head pain. Pt states she tripped at home but reports multiple falls over the last few weeks. Ems reports giving 161mcg fentanyl and afterwards pts begun having shallow respirations and became "apenic" per ems. Ems then gave .4 of narcan. pts cbg also 62 on arrival and gave 12.5 d50 and cbg 135 on arrival to ED.   Pt is alert and ox4.

## 2016-06-05 NOTE — ED Notes (Signed)
2 units blood ready.

## 2016-06-05 NOTE — ED Provider Notes (Signed)
CSN: [id number]     Arrival date & time 06/05/16  1725 History   First MD Initiated Contact with Patient 06/05/16 1755     Chief Complaint  Patient presents with  . Fall     (Consider location/radiation/quality/duration/timing/severity/associated sxs/prior Treatment) HPI [name] is a 65 y.o. female with multiple medical problems presents to emergency department after a fall. Patient states she's not sure why she fell. She denies feeling dizzy or lightheaded, also denies tripping. She states that over the last few weeks she has fallen multiple times. Her knees are weak and sometimes buckle underneath her. Sometimes she states that she does feel dizzy and lightheaded. Patient states that she fell onto the right hip and hit her head. No loss of consciousness. She tried to get up on the floor but was unable to because of the pain to the right hip. She denies any prior hip injuries. She currently denies any chest pain, shortness of breath, dizziness. She states she feels weak. Patient does take Plavix and aspirin. Denies any other blood thinners. Per EMS, patient was given 100 g of fentanyl, and patient began having shallow respirations and went "apneic" and "Gamez gave 0.4 mg of Narcan and check CBG which was 62. Patient was given 12.5 mL vessel of D50.  Past Medical History  Diagnosis Date  . DIABETES MELLITUS, II, COMPLICATIONS 99991111  . HYPERLIPIDEMIA 02/27/2007  . 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 ANEMIA, OTHER, UNSPECIFIED 02/27/2007  . DEPRESSIVE DISORDER, NOS 02/27/2007  . HYPERTENSION, BENIGN SYSTEMIC 02/27/2007  . CORONARY, ARTERIOSCLEROSIS 02/27/2007  . TIA 05/17/2009  . GASTROESOPHAGEAL REFLUX, NO ESOPHAGITIS 02/27/2007  . RENAL INSUFFICIENCY, CHRONIC 12/28/2009  . Renal failure, acute on chronic (HCC) 01/25/2014  . Blood transfusion without reported diagnosis   . Anemia    Past Surgical History  Procedure Laterality Date  . Partial hysterectomy    . Rectocele repair     . Bladder surgery      Tack   Family History  Problem Relation Age of Onset  . Cancer Mother     unknown  . CAD Mother   . Cancer Sister     unknown cancer ?breast ca   Social History  Substance Use Topics  . Smoking status: Never Smoker   . Smokeless tobacco: Never Used  . Alcohol Use: No   OB History    No data available     Review of Systems  Constitutional: Negative for fever and chills.  Respiratory: Negative for cough, chest tightness and shortness of breath.   Cardiovascular: Negative for chest pain, palpitations and leg swelling.  Gastrointestinal: Negative for nausea, vomiting, abdominal pain and diarrhea.  Genitourinary: Negative for dysuria, flank pain and pelvic pain.  Musculoskeletal: Positive for myalgias, joint swelling and arthralgias. Negative for neck pain and neck stiffness.  Skin: Negative for rash.  Neurological: Positive for dizziness and light-headedness. Negative for weakness and headaches.  All other systems reviewed and are negative.     Allergies  Hydralazine hcl; Bisphosphonates; Codeine phosphate; Penicillins; and Sulfa antibiotics  Home Medications   Prior to Admission medications   Medication Sig Start Date End Date Taking? Authorizing Provider  ALPRAZolam Duanne Moron) 1 MG tablet Take 0.5-1 mg by mouth 3 (three) times daily as needed for anxiety.     Historical Provider, MD  aspirin 81 MG chewable tablet Chew 81 mg by mouth at bedtime.    Historical Provider, MD  carvedilol (COREG) 3.125 MG tablet Take 1 tablet (3.125 mg total) by  mouth 2 (two) times daily with a meal. 02/10/16   Bettey Costa, MD  Cholecalciferol (VITAMIN D3) 5000 units TABS Take 5,000 Units by mouth daily.    Historical Provider, MD  Choline Fenofibrate (FENOFIBRIC ACID) 135 MG CPDR Take 135 mg by mouth at bedtime.     Historical Provider, MD  clopidogrel (PLAVIX) 75 MG tablet Take 75 mg by mouth at bedtime.     Historical Provider, MD  FLUoxetine (PROZAC) 20 MG capsule Take  20 mg by mouth daily.     Historical Provider, MD  gabapentin (NEURONTIN) 300 MG capsule Take 600 mg by mouth 3 (three) times daily.     Historical Provider, MD  Glucagon, rDNA, (GLUCAGON EMERGENCY IJ) Inject 1 mg as directed once as needed (for severe hypoglycemia).    Historical Provider, MD  HYDROcodone-acetaminophen (NORCO/VICODIN) 5-325 MG tablet Take 1 tablet by mouth every 4 (four) hours as needed for moderate pain. 03/11/16   Lytle Butte, MD  insulin aspart (NOVOLOG) 100 UNIT/ML injection Inject 2-12 Units into the skin 3 (three) times daily with meals as needed for high blood sugar. Pt uses as needed per sliding scale.    Historical Provider, MD  insulin glargine (LANTUS) 100 UNIT/ML injection Inject 12 Units into the skin daily.    Historical Provider, MD  isosorbide mononitrate (IMDUR) 60 MG 24 hr tablet Take 1 tablet (60 mg total) by mouth daily. 02/10/16   Bettey Costa, MD  ketoconazole (NIZORAL) 2 % shampoo Apply 1 application topically 2 (two) times a week.    Historical Provider, MD  lisinopril (PRINIVIL,ZESTRIL) 2.5 MG tablet Take 2.5 mg by mouth at bedtime.    Historical Provider, MD  nitroGLYCERIN (NITROSTAT) 0.4 MG SL tablet Place 0.4 mg under the tongue every 5 (five) minutes as needed for chest pain.     Historical Provider, MD  pantoprazole (PROTONIX) 40 MG tablet Take 40 mg by mouth daily.    Historical Provider, MD  promethazine (PHENERGAN) 25 MG tablet Take 25 mg by mouth every 6 (six) hours as needed for nausea or vomiting.     Lucille Passy, MD  simvastatin (ZOCOR) 20 MG tablet Take 20 mg by mouth at bedtime.     Historical Provider, MD  topiramate (TOPAMAX) 100 MG tablet Take 100 mg by mouth 2 (two) times daily.     Historical Provider, MD   BP 146/76 mmHg  Pulse 72  Temp(Src) 97.5 F (36.4 C) (Oral)  Resp 16  Ht 5\' 3"  (1.6 m)  Wt 52.164 kg  BMI 20.38 kg/m2  SpO2 100% Physical Exam  Constitutional: She appears well-developed and well-nourished. No distress.  HENT:   Head: Normocephalic.  Large hematoma to the right for head.  Eyes: Conjunctivae and EOM are normal. Pupils are equal, round, and reactive to light.  Neck: Neck supple.  No midline cervical spine tenderness  Cardiovascular: Normal rate, regular rhythm and normal heart sounds.   Pulmonary/Chest: Effort normal and breath sounds normal. No respiratory distress. She has no wheezes. She has no rales.  Abdominal: Soft. Bowel sounds are normal. She exhibits no distension. There is no tenderness. There is no rebound.  Musculoskeletal: She exhibits no edema.  This right hip deformity. Right leg is externally rotated and shortened. Dorsal pedal pulses intact.  Neurological: She is alert.  Skin: Skin is warm and dry.  Psychiatric: She has a normal mood and affect. Her behavior is normal.  Nursing note and vitals reviewed.   ED Course  Procedures (  including critical care time) Labs Review Labs Reviewed  CBC WITH DIFFERENTIAL/PLATELET - Abnormal; Notable for the following:    WBC 3.7 (*)    RBC 2.35 (*)    Hemoglobin 7.3 (*)    HCT 22.6 (*)    All other components within normal limits  PROTIME-INR - Abnormal; Notable for the following:    Prothrombin Time 15.9 (*)    All other components within normal limits  COMPREHENSIVE METABOLIC PANEL - Abnormal; Notable for the following:    Glucose, Bld 130 (*)    BUN 50 (*)    Creatinine, Ser 2.08 (*)    Total Protein 5.4 (*)    Albumin 3.0 (*)    GFR calc non Af Amer 24 (*)    GFR calc Af Amer 27 (*)    All other components within normal limits  CBG MONITORING, ED - Abnormal; Notable for the following:    Glucose-Capillary 136 (*)    All other components within normal limits  URINALYSIS, ROUTINE W REFLEX MICROSCOPIC (NOT AT [facility])  I-STAT TROPOININ, ED  TYPE AND SCREEN  PREPARE RBC (CROSSMATCH)    Imaging Review Dg Chest 1 View  06/05/2016  CLINICAL DATA:  Status post fall today with obvious deformity of the right leg. EXAM: CHEST 1 VIEW  COMPARISON:  February 07, 2016. FINDINGS: The heart size and mediastinal contours are stable. The heart size is mildly enlarged. Both lungs are clear. The visualized skeletal structures are stable. IMPRESSION: No active cardiopulmonary disease. Electronically Signed   By: Abelardo Diesel M.D.   On: 06/05/2016 18:36   Dg Hip Unilat  With Pelvis 2-3 Views Right  06/05/2016  CLINICAL DATA:  Status post fall today with right leg deformity. EXAM: DG HIP (WITH OR WITHOUT PELVIS) 2-3V RIGHT COMPARISON:  None. FINDINGS: There is displaced fracture of the right femoral neck. No other fracture is identified. There is no dislocation. IMPRESSION: Displaced fracture of the right femoral. Electronically Signed   By: Abelardo Diesel M.D.   On: 06/05/2016 18:35   I have personally reviewed and evaluated these images and lab results as part of my medical decision-making.   EKG Interpretation   Date/Time:  Tuesday June 05 2016 19:12:18 EDT Ventricular Rate:  81 PR Interval:  193 QRS Duration: 104 QT Interval:  416 QTC Calculation: 483 R Axis:   52 Text Interpretation:  Sinus rhythm with  Sinus arrhythmia Otherwise within  normal limits When compared with ECG of 03/09/2016, No significant change  was found Confirmed by [facility]  MD, DAVID ([id number]) on 06/05/2016 8:02:32 PM      MDM   Final diagnoses:  Hip fracture, right, closed, initial encounter ([place])  Anemia, unspecified anemia type  Traumatic hematoma of head, initial encounter  Fall, initial encounter   Patient from home with a fall. Multiple falls in the past. Right hip deformity noted was shortened and rotated leg. Most likely hip fracture. Will get labs and x-ray of the hip. Patient also has large hematoma to the right forehead, will get CT head and cervical spine.  8:11 PM Pt with right hip Displaced femoral neck fracture. I discussed patient with Dr. Percell Miller with orthopedics, he plans to operate in the morning if medically cleared. Blood work revealed  anemia, hemoglobin slightly below baseline. Creatinine appears to be at baseline. CT head and cervical spine pending.  9:50 PM CTs negative. Discussed with triad will admit. Ordered 2 units if PRBC. Pt having some nausea and no relief of pain  with fentanyl. Will try morphine. 4mg  of additional zofran ordered. VS remain normal.   Filed Vitals:   06/05/16 1915 06/05/16 2045 06/05/16 2130 06/05/16 2145  BP: 136/74 115/69 131/46 139/61  Pulse: 78 80 76 74  Temp:   97.8 F (36.6 C)   TempSrc:   Oral   Resp: 10 16 16 16   Height:      Weight:      SpO2: 96% 94% 93% 94%     Jeannett Senior, PA-C [id number]  Delora Fuel, MD [id number]

## 2016-06-05 NOTE — H&P (Signed)
[facility] History and Physical  [name] [id number] DOB: 1951-04-12 DOA: 06/05/2016  Referring physician:  PCP: [name], MD   Chief Complaint: "My legs just gave way and next thing and I was on the floor."  HPI: [name] is a 65 y.o. female  With past medical history significant for diabetes, chronic anemia, coronary artery disease status post recent stent placement and chronic kidney disease presents to emergency room after fall. Patient states that she has fallen once every few days over the last 2 weeks. At baseline she uses a walker. She does not always use it as she is supposed to. Patient denies any prodrome prior to her falls. She states when this has occured her legs just get weak and shaky and then she loses her balance and topples over. Both times when she has fallen she has hit her head on the tile floor in her house. She denies any loss of consciousness. Patient does live alone. Patient was brought to the emergency room due to this fall by EMS and was found to have a femoral neck fracture on x-ray. Orthopedics consult in the emergency room  And surgery is planned for tomorrow.  She was found to be anemic and 2 units of blood  Were ordered and the ED for the patient.   Review of Systems:  Constitutional:  No weight loss, night sweats, Fevers, chills, fatigue.  HEENT:  No headaches, Difficulty swallowing,Tooth/dental problems,Sore throat, Cardio-vascular:  No chest pain, Orthopnea, PND, swelling in lower extremities, anasarca, dizziness, palpitations  GI:  No heartburn, indigestion, abdominal pain, nausea, vomiting, diarrhea, change in bowel habits, loss of appetite; stool incontinence  Positive  baseline Resp:   No shortness of breath with exertion or at rest. No excess mucus, no productive cough, No coughing up of blood.No change in color of mucus.No wheezing.No chest wall deformity; pos non-productive cough Skin:  no rash or lesions. ;  positive itching baseline GU:  no dysuria, change in color of urine, no urgency or frequency. No flank pain.  Musculoskeletal:   Chronic backache and neck ache; see history of present illness Psych:  No change in mood or affect. No depression or anxiety. No memory loss.  Neuro:  No change in sensation, unilateral strength, or cognitive abilities  All other systems were reviewed and are negative.  Past Medical History  Diagnosis Date  . DIABETES MELLITUS, II, COMPLICATIONS 99991111  . HYPERLIPIDEMIA 02/27/2007  . 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 ANEMIA, OTHER, UNSPECIFIED 02/27/2007  . DEPRESSIVE DISORDER, NOS 02/27/2007  . HYPERTENSION, BENIGN SYSTEMIC 02/27/2007  . CORONARY, ARTERIOSCLEROSIS 02/27/2007  . TIA 05/17/2009  . GASTROESOPHAGEAL REFLUX, NO ESOPHAGITIS 02/27/2007  . RENAL INSUFFICIENCY, CHRONIC 12/28/2009  . Renal failure, acute on chronic (HCC) 01/25/2014  . Blood transfusion without reported diagnosis   . Anemia    Past Surgical History  Procedure Laterality Date  . Partial hysterectomy    . Rectocele repair    . Bladder surgery      Tack   Social History:  reports that she has never smoked. She has never used smokeless tobacco. She reports that she does not drink alcohol or use illicit drugs.  Allergies  Allergen Reactions  . Hydralazine Hcl Swelling  . Bisphosphonates Rash  . Codeine Phosphate Nausea And Vomiting and Rash  . Penicillins Itching, Rash and Other (See Comments)    Has patient had a PCN reaction causing immediate rash, facial/tongue/throat swelling, SOB or lightheadedness with hypotension: No Has patient had  a PCN reaction causing severe rash involving mucus membranes or skin necrosis: No Has patient had a PCN reaction that required hospitalization No Has patient had a PCN reaction occurring within the last 10 years: No If all of the above answers are "NO", then may proceed with Cephalosporin use.  . Sulfa Antibiotics Itching and Rash    Family  History  Problem Relation Age of Onset  . Cancer Mother     unknown  . CAD Mother   . Cancer Sister     unknown cancer ?breast ca     Prior to Admission medications   Medication Sig Start Date End Date Taking? Authorizing Provider  ALPRAZolam Duanne Moron) 1 MG tablet Take 0.5-1 mg by mouth 3 (three) times daily as needed for anxiety.    Yes Historical Provider, MD  aspirin 81 MG chewable tablet Chew 81 mg by mouth at bedtime.   Yes Historical Provider, MD  carvedilol (COREG) 3.125 MG tablet Take 1 tablet (3.125 mg total) by mouth 2 (two) times daily with a meal. 02/10/16  Yes Sital Mody, MD  Cholecalciferol (VITAMIN D3) 5000 units TABS Take 5,000 Units by mouth daily.   Yes Historical Provider, MD  Choline Fenofibrate (FENOFIBRIC ACID) 135 MG CPDR Take 135 mg by mouth at bedtime.    Yes Historical Provider, MD  clopidogrel (PLAVIX) 75 MG tablet Take 75 mg by mouth at bedtime.    Yes Historical Provider, MD  FLUoxetine (PROZAC) 20 MG capsule Take 20 mg by mouth daily.    Yes Historical Provider, MD  gabapentin (NEURONTIN) 300 MG capsule Take 600 mg by mouth 3 (three) times daily.    Yes Historical Provider, MD  Glucagon, rDNA, (GLUCAGON EMERGENCY IJ) Inject 1 mg as directed once as needed (for severe hypoglycemia).   Yes Historical Provider, MD  insulin aspart (NOVOLOG) 100 UNIT/ML injection Inject 2-12 Units into the skin 3 (three) times daily with meals as needed for high blood sugar. Pt uses as needed per sliding scale.   Yes Historical Provider, MD  insulin glargine (LANTUS) 100 UNIT/ML injection Inject 9 Units into the skin daily.    Yes Historical Provider, MD  isosorbide mononitrate (IMDUR) 60 MG 24 hr tablet Take 1 tablet (60 mg total) by mouth daily. 02/10/16  Yes Sital Mody, MD  ketoconazole (NIZORAL) 2 % shampoo Apply 1 application topically 2 (two) times a week.   Yes Historical Provider, MD  lisinopril (PRINIVIL,ZESTRIL) 2.5 MG tablet Take 2.5 mg by mouth at bedtime.   Yes Historical  Provider, MD  nitroGLYCERIN (NITROSTAT) 0.4 MG SL tablet Place 0.4 mg under the tongue every 5 (five) minutes as needed for chest pain.    Yes Historical Provider, MD  pantoprazole (PROTONIX) 40 MG tablet Take 40 mg by mouth daily.   Yes Historical Provider, MD  promethazine (PHENERGAN) 25 MG tablet Take 25 mg by mouth every 6 (six) hours as needed for nausea or vomiting.    Yes Lucille Passy, MD  simvastatin (ZOCOR) 20 MG tablet Take 20 mg by mouth at bedtime.    Yes Historical Provider, MD  topiramate (TOPAMAX) 100 MG tablet Take 100 mg by mouth 2 (two) times daily.    Yes Historical Provider, MD  HYDROcodone-acetaminophen (NORCO/VICODIN) 5-325 MG tablet Take 1 tablet by mouth every 4 (four) hours as needed for moderate pain. Patient not taking: Reported on 06/05/2016 03/11/16   Lytle Butte, MD   Physical Exam: Filed Vitals:   06/05/16 1737 06/05/16 1800 06/05/16 1915  06/05/16 2045  BP: 146/76 121/95 136/74 115/69  Pulse: 72 78 78 80  Temp: 97.5 F (36.4 C)     TempSrc: Oral     Resp: 16 15 10 16   Height: 5\' 3"  (1.6 m)     Weight: 52.164 kg (115 lb)     SpO2: 100% 100% 96% 94%    Wt Readings from Last 3 Encounters:  06/05/16 52.164 kg (115 lb)  03/09/16 52.164 kg (115 lb)  02/24/16 49.896 kg (110 lb)    General:  Appears calm and comfortable Eyes:  PERRL, EOMI, normal lids, iris ENT:  grossly normal hearing, lips & tongue, significant earwax in both ears Neck:  no LAD, masses or thyromegaly Cardiovascular:  RRR, no m/r/g. No LE edema.  Respiratory:  CTA bilaterally, no w/r/r. Normal respiratory effort. Abdomen:  soft, non distended, mild tenderness in epigastrium Skin:  no rash or induration seen on limited exam Musculoskeletal:  grossly normal tone BUE/BLE; right leg externally rotated and hip tender to palpation Psychiatric:  grossly normal mood and affect, speech fluent and appropriate Neurologic:  CN 2-12 grossly intact, moves all extremities in coordinated fashion.all over  body tremor          Labs on Admission:  Basic Metabolic Panel:  Recent Labs Lab 06/05/16 1740  NA 141  K 4.4  CL 111  CO2 23  GLUCOSE 130*  BUN 50*  CREATININE 2.08*  CALCIUM 8.9   Liver Function Tests:  Recent Labs Lab 06/05/16 1740  AST 28  ALT 24  ALKPHOS 45  BILITOT 0.6  PROT 5.4*  ALBUMIN 3.0*   No results for input(s): LIPASE, AMYLASE in the last 168 hours. No results for input(s): AMMONIA in the last 168 hours. CBC:  Recent Labs Lab 06/05/16 1740  WBC 3.7*  NEUTROABS 2.5  HGB 7.3*  HCT 22.6*  MCV 96.2  PLT 170   Cardiac Enzymes: No results for input(s): CKTOTAL, CKMB, CKMBINDEX, TROPONINI in the last 168 hours.  BNP (last 3 results) No results for input(s): BNP in the last 8760 hours.  ProBNP (last 3 results) No results for input(s): PROBNP in the last 8760 hours.   CREATININE: 2.08 mg/dL ABNORMAL (06/05/16 1740) Estimated creatinine clearance - 21.9 mL/min  CBG:  Recent Labs Lab 06/05/16 2059  GLUCAP 136*    Radiological Exams on Admission: Dg Chest 1 View  06/05/2016  CLINICAL DATA:  Status post fall today with obvious deformity of the right leg. EXAM: CHEST 1 VIEW COMPARISON:  February 07, 2016. FINDINGS: The heart size and mediastinal contours are stable. The heart size is mildly enlarged. Both lungs are clear. The visualized skeletal structures are stable. IMPRESSION: No active cardiopulmonary disease. Electronically Signed   By: Abelardo Diesel M.D.   On: 06/05/2016 18:36   Ct Head Wo Contrast  06/05/2016  CLINICAL DATA:  65 year old female with fall and trauma to the head. EXAM: CT HEAD WITHOUT CONTRAST CT CERVICAL SPINE WITHOUT CONTRAST TECHNIQUE: Multidetector CT imaging of the head and cervical spine was performed following the standard protocol without intravenous contrast. Multiplanar CT image reconstructions of the cervical spine were also generated. COMPARISON:  Head CT dated 06/28/2015 FINDINGS: CT HEAD FINDINGS The ventricles  and sulci are appropriate in size for patient's age. Mild periventricular and deep white matter chronic microvascular ischemic changes noted. There is no acute intracranial hemorrhage. No mass effect or midline shift noted. There is partial opacification of the right sphenoid sinus. The remainder of the visualized paranasal sinuses  and mastoid air cells are clear. The calvarium is intact. CT CERVICAL SPINE FINDINGS There is no acute fracture or subluxation of the cervical spine.There is osteopenia with minimal degenerative changes.The odontoid and spinous processes are intact.There is normal anatomic alignment of the C1-C2 lateral masses. The visualized soft tissues appear unremarkable. IMPRESSION: No acute intracranial hemorrhage. No Acute/traumatic cervical spine pathology. Electronically Signed   By: Anner Crete M.D.   On: 06/05/2016 20:34   Ct Cervical Spine Wo Contrast  06/05/2016  CLINICAL DATA:  65 year old female with fall and trauma to the head. EXAM: CT HEAD WITHOUT CONTRAST CT CERVICAL SPINE WITHOUT CONTRAST TECHNIQUE: Multidetector CT imaging of the head and cervical spine was performed following the standard protocol without intravenous contrast. Multiplanar CT image reconstructions of the cervical spine were also generated. COMPARISON:  Head CT dated 06/28/2015 FINDINGS: CT HEAD FINDINGS The ventricles and sulci are appropriate in size for patient's age. Mild periventricular and deep white matter chronic microvascular ischemic changes noted. There is no acute intracranial hemorrhage. No mass effect or midline shift noted. There is partial opacification of the right sphenoid sinus. The remainder of the visualized paranasal sinuses and mastoid air cells are clear. The calvarium is intact. CT CERVICAL SPINE FINDINGS There is no acute fracture or subluxation of the cervical spine.There is osteopenia with minimal degenerative changes.The odontoid and spinous processes are intact.There is normal  anatomic alignment of the C1-C2 lateral masses. The visualized soft tissues appear unremarkable. IMPRESSION: No acute intracranial hemorrhage. No Acute/traumatic cervical spine pathology. Electronically Signed   By: Anner Crete M.D.   On: 06/05/2016 20:34   Dg Hip Unilat  With Pelvis 2-3 Views Right  06/05/2016  CLINICAL DATA:  Status post fall today with right leg deformity. EXAM: DG HIP (WITH OR WITHOUT PELVIS) 2-3V RIGHT COMPARISON:  None. FINDINGS: There is displaced fracture of the right femoral neck. No other fracture is identified. There is no dislocation. IMPRESSION: Displaced fracture of the right femoral. Electronically Signed   By: Abelardo Diesel M.D.   On: 06/05/2016 18:35    EKG: Independently reviewed. SInus arrhythmia and prolonged QT on EKG Assessment/Plan Principal Problem:   Fracture of femoral neck, right (HCC) Active Problems:   Symptomatic anemia   CKD (chronic kidney disease) stage 4, GFR 15-29 ml/min (HCC)   DM w/o complication type II (HCC)   HLD (hyperlipidemia)   Depressive disorder   HTN (hypertension)   CAD (coronary artery disease)   Fibromyalgia   GERD (gastroesophageal reflux disease)   Head trauma   Decreased hearing of both ears   Hip fracture (HCC)   Right femoral neck fracture Orthopedics consult in the emergency room When necessary morphine 4 mg for severe pain Morphine 2 mg when necessary for moderate pain Nothing by mouth at midnight Surgery in the morning Optimizing patient for surgery overnight - needs resolution of prolonged QT and sev anemia  Prolonged QT Ordering magnesium and phosphorus EKG in a.m. Patient did have low potassium when she first arrived. With correction of anemia and electrolytes EKG could normalize. If normal surgery can occur. If not consider cardiology consult. Revised cardiac risk index for preoperative risk score of 4, 11% risk of major cardiac event  Fall with head trauma & low H/H CT head and neck  notable Patient with baseline mentation per family UA ordered Likely due to deconditioning and acute worsening of her anemia currently being transfused 2 units started in the emergency room Hgb above 10 needed before surgery Currently  holding aspirin and Plavix to complete  Hypertension Cerebral 2.5 mg by mouth daily at bedtime Coreg 3.125 mg twice a day Imdur 60 mg daily  CKD Kidney function baseline Avoid nephrotoxic medication  CAD Nitroglycerin when necessary Holding aspirin and Plavix  Hyperlipidemia Zocor 20 mg daily at bedtime  Fibromyalgia and all over body tremor Gabapentin 300 mg 3 times a day Topamax 100 mg twice a day  Depressive disorder/anxiety Prozac 20 mg daily Xanax when necessary 3 times a day  Diabetes Patient is a known brittle diabetic Lantus dose halfed to from  9 mg to 5 mg Q4 low-dose on a scale insulin  Decreased hearing Likely due to cerumen Wouldl start  Debrox after surgery  Reflux Protonix 40 mg by mouth daily  Code Status:full code DVT Prophylaxis: SCD Family Communication: yes Disposition Plan: Pending Improvement    Elwin Mocha, MD Family Medicine [facility] [email or web address] Password [email or web address]

## 2016-06-05 NOTE — ED Notes (Signed)
Admitting MD at bedside.

## 2016-06-05 NOTE — ED Notes (Signed)
Pt states prior to previous blood transfusions she has always been given benadryl IV. Dr. Roxanne Mins made aware and verbal order to give 25mg  IV. Pt states she sometimes has had itching during transfusions.

## 2016-06-06 ENCOUNTER — Inpatient Hospital Stay (HOSPITAL_COMMUNITY): Payer: Commercial Managed Care - HMO

## 2016-06-06 DIAGNOSIS — I25118 Atherosclerotic heart disease of native coronary artery with other forms of angina pectoris: Secondary | ICD-10-CM

## 2016-06-06 DIAGNOSIS — K219 Gastro-esophageal reflux disease without esophagitis: Secondary | ICD-10-CM

## 2016-06-06 DIAGNOSIS — H9193 Unspecified hearing loss, bilateral: Secondary | ICD-10-CM

## 2016-06-06 DIAGNOSIS — E109 Type 1 diabetes mellitus without complications: Secondary | ICD-10-CM

## 2016-06-06 DIAGNOSIS — N184 Chronic kidney disease, stage 4 (severe): Secondary | ICD-10-CM

## 2016-06-06 DIAGNOSIS — D649 Anemia, unspecified: Secondary | ICD-10-CM

## 2016-06-06 DIAGNOSIS — E785 Hyperlipidemia, unspecified: Secondary | ICD-10-CM

## 2016-06-06 DIAGNOSIS — I10 Essential (primary) hypertension: Secondary | ICD-10-CM

## 2016-06-06 LAB — URINALYSIS, ROUTINE W REFLEX MICROSCOPIC
Bilirubin Urine: NEGATIVE
Glucose, UA: NEGATIVE mg/dL
Hgb urine dipstick: NEGATIVE
KETONES UR: NEGATIVE mg/dL
NITRITE: NEGATIVE
PH: 5 (ref 5.0–8.0)
PROTEIN: NEGATIVE mg/dL
Specific Gravity, Urine: 1.019 (ref 1.005–1.030)

## 2016-06-06 LAB — GLUCOSE, CAPILLARY
GLUCOSE-CAPILLARY: 149 mg/dL — AB (ref 65–99)
GLUCOSE-CAPILLARY: 102 mg/dL — AB (ref 65–99)
GLUCOSE-CAPILLARY: 65 mg/dL (ref 65–99)
GLUCOSE-CAPILLARY: 72 mg/dL (ref 65–99)
Glucose-Capillary: 127 mg/dL — ABNORMAL HIGH (ref 65–99)
Glucose-Capillary: 131 mg/dL — ABNORMAL HIGH (ref 65–99)
Glucose-Capillary: 156 mg/dL — ABNORMAL HIGH (ref 65–99)
Glucose-Capillary: 159 mg/dL — ABNORMAL HIGH (ref 65–99)

## 2016-06-06 LAB — CBC
HEMATOCRIT: 31.9 % — AB (ref 36.0–46.0)
HEMOGLOBIN: 10.4 g/dL — AB (ref 12.0–15.0)
MCH: 29 pg (ref 26.0–34.0)
MCHC: 32.6 g/dL (ref 30.0–36.0)
MCV: 88.9 fL (ref 78.0–100.0)
Platelets: 108 10*3/uL — ABNORMAL LOW (ref 150–400)
RBC: 3.59 MIL/uL — ABNORMAL LOW (ref 3.87–5.11)
RDW: 17.6 % — ABNORMAL HIGH (ref 11.5–15.5)
WBC: 6 10*3/uL (ref 4.0–10.5)

## 2016-06-06 LAB — BASIC METABOLIC PANEL
ANION GAP: 6 (ref 5–15)
BUN: 43 mg/dL — AB (ref 6–20)
CHLORIDE: 110 mmol/L (ref 101–111)
CO2: 25 mmol/L (ref 22–32)
Calcium: 8.8 mg/dL — ABNORMAL LOW (ref 8.9–10.3)
Creatinine, Ser: 1.82 mg/dL — ABNORMAL HIGH (ref 0.44–1.00)
GFR calc Af Amer: 32 mL/min — ABNORMAL LOW (ref >60)
GFR, EST NON AFRICAN AMERICAN: 28 mL/min — AB (ref >60)
GLUCOSE: 131 mg/dL — AB (ref 65–99)
POTASSIUM: 4.4 mmol/L (ref 3.5–5.1)
Sodium: 141 mmol/L (ref 135–145)

## 2016-06-06 LAB — PROTIME-INR
INR: 1.17 (ref 0.00–1.49)
Prothrombin Time: 15.1 seconds (ref 11.6–15.2)

## 2016-06-06 LAB — URINE MICROSCOPIC-ADD ON

## 2016-06-06 LAB — MAGNESIUM: Magnesium: 1.9 mg/dL (ref 1.7–2.4)

## 2016-06-06 LAB — PHOSPHORUS: Phosphorus: 3.8 mg/dL (ref 2.5–4.6)

## 2016-06-06 LAB — SURGICAL PCR SCREEN
MRSA, PCR: NEGATIVE
STAPHYLOCOCCUS AUREUS: NEGATIVE

## 2016-06-06 MED ORDER — LEVOFLOXACIN 500 MG PO TABS
500.0000 mg | ORAL_TABLET | ORAL | Status: AC
  Administered 2016-06-06 – 2016-06-10 (×3): 500 mg via ORAL
  Filled 2016-06-06 (×3): qty 1

## 2016-06-06 MED ORDER — DEXTROSE 5 % IV SOLN
1.0000 g | INTRAVENOUS | Status: DC
  Filled 2016-06-06: qty 10

## 2016-06-06 MED ORDER — MORPHINE SULFATE (PF) 4 MG/ML IV SOLN
4.0000 mg | INTRAVENOUS | Status: DC | PRN
  Administered 2016-06-06 – 2016-06-07 (×4): 4 mg via INTRAVENOUS
  Filled 2016-06-06 (×4): qty 1

## 2016-06-06 MED ORDER — CEFAZOLIN SODIUM-DEXTROSE 2-4 GM/100ML-% IV SOLN
2.0000 g | Freq: Once | INTRAVENOUS | Status: AC
  Administered 2016-06-07: 2 g via INTRAVENOUS
  Filled 2016-06-06: qty 100

## 2016-06-06 MED ORDER — ONDANSETRON HCL 4 MG PO TABS
4.0000 mg | ORAL_TABLET | Freq: Four times a day (QID) | ORAL | Status: DC | PRN
Start: 2016-06-06 — End: 2016-06-07

## 2016-06-06 MED ORDER — ALPRAZOLAM 0.5 MG PO TABS
0.5000 mg | ORAL_TABLET | Freq: Three times a day (TID) | ORAL | Status: DC | PRN
  Administered 2016-06-08 – 2016-06-11 (×2): 1 mg via ORAL
  Filled 2016-06-06: qty 2
  Filled 2016-06-06: qty 1
  Filled 2016-06-06: qty 2

## 2016-06-06 MED ORDER — CARVEDILOL 3.125 MG PO TABS
3.1250 mg | ORAL_TABLET | Freq: Two times a day (BID) | ORAL | Status: DC
  Administered 2016-06-06 – 2016-06-11 (×12): 3.125 mg via ORAL
  Filled 2016-06-06 (×12): qty 1

## 2016-06-06 MED ORDER — SODIUM CHLORIDE 0.9 % IV SOLN
INTRAVENOUS | Status: AC
  Administered 2016-06-06: 05:00:00 via INTRAVENOUS

## 2016-06-06 MED ORDER — SIMVASTATIN 20 MG PO TABS
20.0000 mg | ORAL_TABLET | Freq: Every day | ORAL | Status: DC
  Administered 2016-06-06 – 2016-06-10 (×5): 20 mg via ORAL
  Filled 2016-06-06 (×5): qty 1

## 2016-06-06 MED ORDER — FLUOXETINE HCL 20 MG PO CAPS
20.0000 mg | ORAL_CAPSULE | Freq: Every day | ORAL | Status: DC
  Administered 2016-06-06 – 2016-06-11 (×6): 20 mg via ORAL
  Filled 2016-06-06 (×6): qty 1

## 2016-06-06 MED ORDER — SODIUM CHLORIDE 0.9 % IV SOLN
INTRAVENOUS | Status: AC
  Administered 2016-06-06 – 2016-06-07 (×2): via INTRAVENOUS

## 2016-06-06 MED ORDER — TOPIRAMATE 100 MG PO TABS
100.0000 mg | ORAL_TABLET | Freq: Two times a day (BID) | ORAL | Status: DC
  Administered 2016-06-06 – 2016-06-11 (×11): 100 mg via ORAL
  Filled 2016-06-06 (×11): qty 1

## 2016-06-06 MED ORDER — LISINOPRIL 2.5 MG PO TABS
2.5000 mg | ORAL_TABLET | Freq: Every day | ORAL | Status: DC
  Administered 2016-06-06 – 2016-06-09 (×4): 2.5 mg via ORAL
  Filled 2016-06-06 (×4): qty 1

## 2016-06-06 MED ORDER — ONDANSETRON HCL 4 MG/2ML IJ SOLN: 4.0000 mg | Freq: Three times a day (TID) | INTRAMUSCULAR | Status: DC | PRN

## 2016-06-06 MED ORDER — ISOSORBIDE MONONITRATE ER 60 MG PO TB24
60.0000 mg | ORAL_TABLET | Freq: Every day | ORAL | Status: DC
  Administered 2016-06-06 – 2016-06-11 (×6): 60 mg via ORAL
  Filled 2016-06-06 (×6): qty 1

## 2016-06-06 MED ORDER — NITROGLYCERIN 0.4 MG SL SUBL: 0.4000 mg | SUBLINGUAL_TABLET | SUBLINGUAL | Status: DC | PRN

## 2016-06-06 MED ORDER — ONDANSETRON HCL 4 MG/2ML IJ SOLN: 4.0000 mg | Freq: Four times a day (QID) | INTRAMUSCULAR | Status: DC | PRN

## 2016-06-06 MED ORDER — INSULIN GLARGINE 100 UNIT/ML ~~LOC~~ SOLN
5.0000 [IU] | Freq: Every day | SUBCUTANEOUS | Status: DC
  Administered 2016-06-06 – 2016-06-08 (×3): 5 [IU] via SUBCUTANEOUS
  Filled 2016-06-06 (×3): qty 0.05

## 2016-06-06 MED ORDER — MORPHINE SULFATE (PF) 2 MG/ML IV SOLN
2.0000 mg | Freq: Three times a day (TID) | INTRAVENOUS | Status: DC | PRN
  Administered 2016-06-06 – 2016-06-07 (×2): 2 mg via INTRAVENOUS
  Filled 2016-06-06 (×2): qty 1

## 2016-06-06 MED ORDER — GABAPENTIN 300 MG PO CAPS
600.0000 mg | ORAL_CAPSULE | Freq: Three times a day (TID) | ORAL | Status: DC
  Administered 2016-06-06 – 2016-06-11 (×15): 600 mg via ORAL
  Filled 2016-06-06 (×9): qty 2
  Filled 2016-06-06: qty 6
  Filled 2016-06-06 (×6): qty 2

## 2016-06-06 MED ORDER — INSULIN ASPART 100 UNIT/ML ~~LOC~~ SOLN
0.0000 [IU] | SUBCUTANEOUS | Status: DC
  Administered 2016-06-08: 2 [IU] via SUBCUTANEOUS
  Administered 2016-06-08 (×2): 3 [IU] via SUBCUTANEOUS
  Administered 2016-06-09: 2 [IU] via SUBCUTANEOUS
  Administered 2016-06-09 (×2): 3 [IU] via SUBCUTANEOUS

## 2016-06-06 MED ORDER — MORPHINE SULFATE (PF) 4 MG/ML IV SOLN: 4.0000 mg | INTRAVENOUS | Status: DC | PRN

## 2016-06-06 MED ORDER — PANTOPRAZOLE SODIUM 40 MG PO TBEC
40.0000 mg | DELAYED_RELEASE_TABLET | Freq: Every day | ORAL | Status: DC
  Administered 2016-06-06 – 2016-06-11 (×5): 40 mg via ORAL
  Filled 2016-06-06 (×5): qty 1

## 2016-06-06 NOTE — Progress Notes (Signed)
Pharmacy Antibiotic Note  [name] is a 65 y.o. female admitted on 06/05/2016 s/p fall with resulting R hip fracture, planning to undergo surgical fixation tomorrow. Pharmacy is consulted to begin Levaquin for UTI. UA shows bacteriuria but otherwise looks negative for UTI, WBC 6, afebrile. Pt has kidney dysfunction at baseline, current SCr 1.8, eCrCl ~ 25-30 ml/min.   Plan: -Levaquin 500 mg PO q48h -Recommend short 3 day course -Pharmacy will sign off, please re-consult as needed   Height: 5\' 3"  (160 cm) Weight: 115 lb (52.164 kg) IBW/kg (Calculated) : 52.4  Temp (24hrs), Avg:98.1 F (36.7 C), Min:97.5 F (36.4 C), Max:98.5 F (36.9 C)   Recent Labs Lab 06/05/16 1740 06/06/16 0652  WBC 3.7* 6.0  CREATININE 2.08* 1.82*    Estimated Creatinine Clearance: 25.1 mL/min (by C-G formula based on Cr of 1.82).    Allergies  Allergen Reactions  . Hydralazine Hcl Swelling  . Bisphosphonates Rash  . Codeine Phosphate Nausea And Vomiting and Rash  . Penicillins Itching, Rash and Other (See Comments)    Has patient had a PCN reaction causing immediate rash, facial/tongue/throat swelling, SOB or lightheadedness with hypotension: No Has patient had a PCN reaction causing severe rash involving mucus membranes or skin necrosis: No Has patient had a PCN reaction that required hospitalization No Has patient had a PCN reaction occurring within the last 10 years: No If all of the above answers are "NO", then may proceed with Cephalosporin use.  . Sulfa Antibiotics Itching and Rash    Antimicrobials this admission: 6/7 Levaquin >  Dose adjustments this admission: NA  Microbiology results: MRSA pcr: neg  Thank you for allowing pharmacy to be a part of this patient's care.  Harvel Quale 06/06/2016 10:08 AM

## 2016-06-06 NOTE — Progress Notes (Signed)
Pharmacy consulted for "renal dosing and precautions".  Scr 2.08 - which is consistent with the past - appears to be at baseline. Pt with known CKD diagnosed 11/2009.    Pt on low dose ace-inhibitor of lisinopril 2.5 daily.   Plan: no medications changes recommended Thanks  Eudelia Bunch, Pharm.D. BP:[phone] 06/06/2016 12:49 AM

## 2016-06-06 NOTE — Consult Note (Signed)
I have reviewed fillms Tentative plan is for right hip hemi on Thursday after optimization and risk stratification  Formal cs to follow   Charlesetta Milliron D

## 2016-06-06 NOTE — Progress Notes (Signed)
Orthopaedic Trauma Service Progress Note  Subjective Presents after fall from standing yesterday morning.  Patient complains of weakness and right hip pain this morning.  Denies numbness or tingling in right leg or other extremities. Patient also complains of recent cramping and muscle spasms.  ROS    Objective  BP 155/55 mmHg  Pulse 77  Temp(Src) 98.5 F (36.9 C) (Oral)  Resp 17  Ht 5\' 3"  (1.6 m)  Wt 52.164 kg (115 lb)  BMI 20.38 kg/m2  SpO2 93%  Intake/Output      06/06 0701 - 06/07 0700 06/07 0701 - 06/08 0700   P.O.  120   Blood 1005    Total Intake(mL/kg) 1005 (19.3) 120 (2.3)   Net +1005 +120        Urine Occurrence 1 x      Labs  Results for ROLLA, [name] (MRN [id number]) as of 06/06/2016 09:35  Ref. Range 06/05/2016 17:40 06/06/2016 06:52  WBC Latest Ref Range: 4.0-10.5 K/uL 3.7 (L) 6.0  RBC Latest Ref Range: 3.87-5.11 MIL/uL 2.35 (L) 3.59 (L)  Hemoglobin Latest Ref Range: 12.0-15.0 g/dL 7.3 (L) 10.4 (L)  HCT Latest Ref Range: 36.0-46.0 % 22.6 (L) 31.9 (L)  MCV Latest Ref Range: 78.0-100.0 fL 96.2 88.9  MCH Latest Ref Range: 26.0-34.0 pg 31.1 29.0  MCHC Latest Ref Range: 30.0-36.0 g/dL 32.3 32.6  RDW Latest Ref Range: 11.5-15.5 % 14.4 17.6 (H)  Platelets Latest Ref Range: 150-400 K/uL 170 108 (L)   Results for Latoya, [name] (MRN [id number]) as of 06/06/2016 09:35  Ref. Range 06/06/2016 01:15  Appearance Latest Ref Range: CLEAR  CLOUDY (A)  Bacteria, UA Latest Ref Range: NONE SEEN  MANY (A)  Bilirubin Urine Latest Ref Range: NEGATIVE  NEGATIVE  Color, Urine Latest Ref Range: YELLOW  YELLOW  Glucose Latest Ref Range: NEGATIVE mg/dL NEGATIVE  Hgb urine dipstick Latest Ref Range: NEGATIVE  NEGATIVE  Ketones, ur Latest Ref Range: NEGATIVE mg/dL NEGATIVE  Leukocytes, UA Latest Ref Range: NEGATIVE  TRACE (A)  Nitrite Latest Ref Range: NEGATIVE  NEGATIVE  pH Latest Ref Range: 5.0-8.0  5.0  Protein Latest Ref Range: NEGATIVE mg/dL NEGATIVE  RBC / HPF Latest  Ref Range: 0-5 RBC/hpf 0-5  Specific Gravity, Urine Latest Ref Range: 1.005-1.030  1.019  Squamous Epithelial / LPF Latest Ref Range: NONE SEEN  0-5 (A)  WBC, UA Latest Ref Range: 0-5 WBC/hpf 6-30    Exam  Gen: Patient is sitting up in bed, very weak, NAD Lungs: CTAB Cardiac: RRR, no murmurs, rubs, gallops Abd: +BS, + peripheral pulses Pelvis: Slight deformity felt on right side with femoral head fracture and displacement; No ecchymosis or edema Ext:   Right Lower Extremity   No edema, +pulses, knee pain on palpation, PMS intact  Left Lower Extremity   No edema, + pulses  Imaging  Assessment and Plan   POD/HD#: 1  S/P fall from standing with right femoral head fracture  -Right femoral head fracture and displacement:  OR tomorrow morning  Continue bed rest  Weight bearing as tolerated after surgery   R hip hemiarthroplasty due to low demand   - Pain management:  Morphine  PO tylenol   - ABL anemia/Hemodynamics  Monitor H/H   - Medical issues   UTI - IV Levaquin   - DVT/PE prophylaxis:  SCDs  Lovenox- post op x 21 days   - ID:   UTI - IV Levaquin   - Metabolic Bone Disease:  Check Vit D and  PTH levels in setting of CKD    Suspect pt has poor bone quality due to DM, CKD, limited activity, etc   - Activity:  Bed Rest  Weight bearing as tolerated after surgery  - FEN/GI prophylaxis/Foley/Lines:  NPO after midnight   Nutrition consult   - Impediments to fracture healing:  Diabetes  Anemia  CKD    - Dispo:  OR tomorrow   Pt has an elevated frailty index (DM, MI, stents, TIA, HTN meds, low functional status) and is at an increased risks for periop complications but the risks of non-op management likely to be greater than that of operative management. Pt at increased risk for wound healing issues, nonunion/prosthetic loosening, DVT/PE, PNA, etc. Operative intervention likely to provide best chance to return to baseline.    Rosalita Levan,  PA-S [facility] [phone] (O) 06/06/2016 9:27 AM     Orthopaedic Trauma Service consult  Reason: R femoral neck fracture  Requesting; Alain Marion, MD  HPI:    65 y/o white female with numerous medical comorbidities that sustained a ground level fall yesterday with resultant R femoral neck fracture. Pt admitted to medical service   Pt c/o only of R hip and R knee pain  See has long standing insulin dependent DM  CAD with multiple MI's, last MI was about 3 months ago, + cardiac stents. Hx of TIA, CKD (not on dialysis), anemia of chronic disease  She is severely deconditioned with limited mobility  She seems to be in denial about her overall health and poor functional capacity   States sugars have been in the 200-300's the last several weeks   She is under-eating and appears malnurished. States she eats TV dinners mostly as cooking is too taxing She has a walker but doesn't use it  She is really homebound and does not go out much  Lives by herself but family close by. Lives in bottom apartment unit   Pt has received PRBCs in preparation for surgery  + UTI on u/a, culture is pending   Past Medical History  Diagnosis Date  . DIABETES MELLITUS, II, COMPLICATIONS 99991111  . HYPERLIPIDEMIA 02/27/2007  . 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 ANEMIA, OTHER, UNSPECIFIED 02/27/2007  . DEPRESSIVE DISORDER, NOS 02/27/2007  . HYPERTENSION, BENIGN SYSTEMIC 02/27/2007  . CORONARY, ARTERIOSCLEROSIS 02/27/2007  . TIA 05/17/2009  . GASTROESOPHAGEAL REFLUX, NO ESOPHAGITIS 02/27/2007  . RENAL INSUFFICIENCY, CHRONIC 12/28/2009  . Renal failure, acute on chronic (HCC) 01/25/2014  . Blood transfusion without reported diagnosis   . Anemia    Past Surgical History  Procedure Laterality Date  . Partial hysterectomy    . Rectocele repair    . Bladder surgery      Tack   Allergies  Allergen Reactions  . Hydralazine Hcl Swelling  . Bisphosphonates Rash  . Codeine  Phosphate Nausea And Vomiting and Rash  . Penicillins Itching, Rash and Other (See Comments)    Has patient had a PCN reaction causing immediate rash, facial/tongue/throat swelling, SOB or lightheadedness with hypotension: No Has patient had a PCN reaction causing severe rash involving mucus membranes or skin necrosis: No Has patient had a PCN reaction that required hospitalization No Has patient had a PCN reaction occurring within the last 10 years: No If all of the above answers are "NO", then may proceed with Cephalosporin use.  . Sulfa Antibiotics Itching and Rash   Family History  Problem Relation Age of Onset  . Cancer Mother  unknown  . CAD Mother   . Cancer Sister     unknown cancer ?breast ca    Medications: reviewed   BP 155/55 mmHg  Pulse 77  Temp(Src) 98.5 F (36.9 C) (Oral)  Resp 17  Ht 5\' 3"  (1.6 m)  Wt 52.164 kg (115 lb)  BMI 20.38 kg/m2  SpO2 93%  Labs: as above   Exam: (agree with exam by PA-S above)  Gen: older appearing frail white female Pelvis/RLEx  Leg slightly shorter and ER   Warm  + DP pulse    DPN, SPN, TN sensation grossly intact  EHL, FHL, AT, PT, peroneals, gastroc motor intact  TTP R knee     No effusion appreciated   ROM of knee and hip not performed due to fx   Assessment and Plan  See above   Jari Pigg, PA-C [facility] [phone] (P) 06/06/2016 12:23 PM

## 2016-06-06 NOTE — Consult Note (Signed)
ORTHOPAEDIC CONSULTATION  REQUESTING PHYSICIAN: Murlean Iba, MD  Chief Complaint: Right hip pain after fall.  Assessment: Principal Problem:   Fracture of femoral neck, right (HCC) Active Problems:   Symptomatic anemia   CKD (chronic kidney disease) stage 4, GFR 15-29 ml/min (HCC)   DM w/o complication type II (HCC)   HLD (hyperlipidemia)   Depressive disorder   HTN (hypertension)   CAD (coronary artery disease)   Fibromyalgia   GERD (gastroesophageal reflux disease)   Head trauma   Decreased hearing of both ears   Hip fracture (HCC)   Femoral neck fracture (HCC)   Plan: Tentatively plan for right hip hemi on Thursday after evaluation, optimization and risk stratification by her primary team - this may require cardiac clearance.  Surgery may be conducted by Dr. Alain Marion or one of his colleagues - This was discussed with the patient and she agrees with this plan.  Weight Bearing Status: NWB.  Plan for WBAT post op. VTE px: SCD's and additional per primary.  HPI: [name] is a 65 y.o. female who complains of right hip pain afte a fall.  She has CKD and anemia chronically.  She does not know why she fell last night on right hip and also hit her head.  She has fallen multiple times in the past few weeks.  There was no LOC.  No CP, SOB.   She does take ASA 81mg  and Plavix. Xrays show displaced fracture of the right femoral neck.  Orthopedics was consulted for evaluation.  Past Medical History  Diagnosis Date  . DIABETES MELLITUS, II, COMPLICATIONS 99991111  . HYPERLIPIDEMIA 02/27/2007  . 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 ANEMIA, OTHER, UNSPECIFIED 02/27/2007  . DEPRESSIVE DISORDER, NOS 02/27/2007  . HYPERTENSION, BENIGN SYSTEMIC 02/27/2007  . CORONARY, ARTERIOSCLEROSIS 02/27/2007  . TIA 05/17/2009  . GASTROESOPHAGEAL REFLUX, NO ESOPHAGITIS 02/27/2007  . RENAL INSUFFICIENCY, CHRONIC 12/28/2009  . Renal failure, acute on chronic (HCC) 01/25/2014  . Blood  transfusion without reported diagnosis   . Anemia    Past Surgical History  Procedure Laterality Date  . Partial hysterectomy    . Rectocele repair    . Bladder surgery      Tack   Social History   Social History  . Marital Status: Divorced    Spouse Name: N/A  . Number of Children: 2  . Years of Education: N/A   Occupational History  . Disabled    Social History Main Topics  . Smoking status: Never Smoker   . Smokeless tobacco: Never Used  . Alcohol Use: No  . Drug Use: No  . Sexual Activity: Not Asked   Other Topics Concern  . None   Social History Narrative   Family History  Problem Relation Age of Onset  . Cancer Mother     unknown  . CAD Mother   . Cancer Sister     unknown cancer ?breast ca   Allergies  Allergen Reactions  . Hydralazine Hcl Swelling  . Bisphosphonates Rash  . Codeine Phosphate Nausea And Vomiting and Rash  . Penicillins Itching, Rash and Other (See Comments)    Has patient had a PCN reaction causing immediate rash, facial/tongue/throat swelling, SOB or lightheadedness with hypotension: No Has patient had a PCN reaction causing severe rash involving mucus membranes or skin necrosis: No Has patient had a PCN reaction that required hospitalization No Has patient had a PCN reaction occurring within the last 10 years: No If all  of the above answers are "NO", then may proceed with Cephalosporin use.  . Sulfa Antibiotics Itching and Rash   Prior to Admission medications   Medication Sig Start Date End Date Taking? Authorizing Provider  ALPRAZolam Duanne Moron) 1 MG tablet Take 0.5-1 mg by mouth 3 (three) times daily as needed for anxiety.    Yes Historical Provider, MD  aspirin 81 MG chewable tablet Chew 81 mg by mouth at bedtime.   Yes Historical Provider, MD  carvedilol (COREG) 3.125 MG tablet Take 1 tablet (3.125 mg total) by mouth 2 (two) times daily with a meal. 02/10/16  Yes Sital Mody, MD  Cholecalciferol (VITAMIN D3) 5000 units TABS Take  5,000 Units by mouth daily.   Yes Historical Provider, MD  Choline Fenofibrate (FENOFIBRIC ACID) 135 MG CPDR Take 135 mg by mouth at bedtime.    Yes Historical Provider, MD  clopidogrel (PLAVIX) 75 MG tablet Take 75 mg by mouth at bedtime.    Yes Historical Provider, MD  FLUoxetine (PROZAC) 20 MG capsule Take 20 mg by mouth daily.    Yes Historical Provider, MD  gabapentin (NEURONTIN) 300 MG capsule Take 600 mg by mouth 3 (three) times daily.    Yes Historical Provider, MD  Glucagon, rDNA, (GLUCAGON EMERGENCY IJ) Inject 1 mg as directed once as needed (for severe hypoglycemia).   Yes Historical Provider, MD  insulin aspart (NOVOLOG) 100 UNIT/ML injection Inject 2-12 Units into the skin 3 (three) times daily with meals as needed for high blood sugar. Pt uses as needed per sliding scale.   Yes Historical Provider, MD  insulin glargine (LANTUS) 100 UNIT/ML injection Inject 9 Units into the skin daily.    Yes Historical Provider, MD  isosorbide mononitrate (IMDUR) 60 MG 24 hr tablet Take 1 tablet (60 mg total) by mouth daily. 02/10/16  Yes Sital Mody, MD  ketoconazole (NIZORAL) 2 % shampoo Apply 1 application topically 2 (two) times a week.   Yes Historical Provider, MD  lisinopril (PRINIVIL,ZESTRIL) 2.5 MG tablet Take 2.5 mg by mouth at bedtime.   Yes Historical Provider, MD  nitroGLYCERIN (NITROSTAT) 0.4 MG SL tablet Place 0.4 mg under the tongue every 5 (five) minutes as needed for chest pain.    Yes Historical Provider, MD  pantoprazole (PROTONIX) 40 MG tablet Take 40 mg by mouth daily.   Yes Historical Provider, MD  promethazine (PHENERGAN) 25 MG tablet Take 25 mg by mouth every 6 (six) hours as needed for nausea or vomiting.    Yes Lucille Passy, MD  simvastatin (ZOCOR) 20 MG tablet Take 20 mg by mouth at bedtime.    Yes Historical Provider, MD  topiramate (TOPAMAX) 100 MG tablet Take 100 mg by mouth 2 (two) times daily.    Yes Historical Provider, MD  HYDROcodone-acetaminophen (NORCO/VICODIN) 5-325  MG tablet Take 1 tablet by mouth every 4 (four) hours as needed for moderate pain. Patient not taking: Reported on 06/05/2016 03/11/16   Lytle Butte, MD   Dg Chest 1 View  06/05/2016  CLINICAL DATA:  Status post fall today with obvious deformity of the right leg. EXAM: CHEST 1 VIEW COMPARISON:  February 07, 2016. FINDINGS: The heart size and mediastinal contours are stable. The heart size is mildly enlarged. Both lungs are clear. The visualized skeletal structures are stable. IMPRESSION: No active cardiopulmonary disease. Electronically Signed   By: Abelardo Diesel M.D.   On: 06/05/2016 18:36   Abd 1 View (kub)  06/06/2016  CLINICAL DATA:  Chronic diarrhea for  3-4 years. Large distal colonic stool burden on recent CT. EXAM: ABDOMEN - 1 VIEW COMPARISON:  CT on 03/11/2016 FINDINGS: No evidence of dilated bowel loops. A small to moderate amount of stool is seen within the descending and rectosigmoid colon. IMPRESSION: No acute findings.  Small to moderate stool burden again noted. Electronically Signed   By: Earle Gell M.D.   On: 06/06/2016 08:24   Ct Head Wo Contrast  06/05/2016  CLINICAL DATA:  65 year old female with fall and trauma to the head. EXAM: CT HEAD WITHOUT CONTRAST CT CERVICAL SPINE WITHOUT CONTRAST TECHNIQUE: Multidetector CT imaging of the head and cervical spine was performed following the standard protocol without intravenous contrast. Multiplanar CT image reconstructions of the cervical spine were also generated. COMPARISON:  Head CT dated 06/28/2015 FINDINGS: CT HEAD FINDINGS The ventricles and sulci are appropriate in size for patient's age. Mild periventricular and deep white matter chronic microvascular ischemic changes noted. There is no acute intracranial hemorrhage. No mass effect or midline shift noted. There is partial opacification of the right sphenoid sinus. The remainder of the visualized paranasal sinuses and mastoid air cells are clear. The calvarium is intact. CT CERVICAL SPINE  FINDINGS There is no acute fracture or subluxation of the cervical spine.There is osteopenia with minimal degenerative changes.The odontoid and spinous processes are intact.There is normal anatomic alignment of the C1-C2 lateral masses. The visualized soft tissues appear unremarkable. IMPRESSION: No acute intracranial hemorrhage. No Acute/traumatic cervical spine pathology. Electronically Signed   By: Anner Crete M.D.   On: 06/05/2016 20:34   Ct Cervical Spine Wo Contrast  06/05/2016  CLINICAL DATA:  65 year old female with fall and trauma to the head. EXAM: CT HEAD WITHOUT CONTRAST CT CERVICAL SPINE WITHOUT CONTRAST TECHNIQUE: Multidetector CT imaging of the head and cervical spine was performed following the standard protocol without intravenous contrast. Multiplanar CT image reconstructions of the cervical spine were also generated. COMPARISON:  Head CT dated 06/28/2015 FINDINGS: CT HEAD FINDINGS The ventricles and sulci are appropriate in size for patient's age. Mild periventricular and deep white matter chronic microvascular ischemic changes noted. There is no acute intracranial hemorrhage. No mass effect or midline shift noted. There is partial opacification of the right sphenoid sinus. The remainder of the visualized paranasal sinuses and mastoid air cells are clear. The calvarium is intact. CT CERVICAL SPINE FINDINGS There is no acute fracture or subluxation of the cervical spine.There is osteopenia with minimal degenerative changes.The odontoid and spinous processes are intact.There is normal anatomic alignment of the C1-C2 lateral masses. The visualized soft tissues appear unremarkable. IMPRESSION: No acute intracranial hemorrhage. No Acute/traumatic cervical spine pathology. Electronically Signed   By: Anner Crete M.D.   On: 06/05/2016 20:34   Dg Hip Unilat  With Pelvis 2-3 Views Right  06/05/2016  CLINICAL DATA:  Status post fall today with right leg deformity. EXAM: DG HIP (WITH OR WITHOUT  PELVIS) 2-3V RIGHT COMPARISON:  None. FINDINGS: There is displaced fracture of the right femoral neck. No other fracture is identified. There is no dislocation. IMPRESSION: Displaced fracture of the right femoral. Electronically Signed   By: Abelardo Diesel M.D.   On: 06/05/2016 18:35    Positive ROS: All other systems have been reviewed and were otherwise negative with the exception of those mentioned in the HPI and as above.  Objective: Labs cbc  Recent Labs  06/05/16 1740 06/06/16 0652  WBC 3.7* 6.0  HGB 7.3* 10.4*  HCT 22.6* 31.9*  PLT 170 PENDING  Labs coag  Recent Labs  06/05/16 1740  INR 1.25     Recent Labs  06/05/16 1740  NA 141  K 4.4  CL 111  CO2 23  GLUCOSE 130*  BUN 50*  CREATININE 2.08*  CALCIUM 8.9    Physical Exam: Filed Vitals:   06/06/16 0426 06/06/16 0604  BP: 149/57 155/55  Pulse: 79 77  Temp: 98.5 F (36.9 C) 98.5 F (36.9 C)  Resp: 16 17   General: Alert, no acute distress.  Family in room. Cardiovascular: No pedal edema Respiratory: No cyanosis, no use of accessory musculature GI: No organomegaly, abdomen is soft and non-tender Skin: No lesions in the area of chief complaint Neurologic: Sensation intact distally  Psychiatric: Patient is competent for consent with normal mood and affect  MUSCULOSKELETAL:  Right leg is externally rotated and shortened.  NVI.  Sensation intact distally. Other extremities are atraumatic with painless ROM and NVI.   Prudencio Burly III PA-C 06/06/2016 8:30 AM

## 2016-06-06 NOTE — Progress Notes (Signed)
PROGRESS NOTE    [name]  [id number]  DOB: 20-Dec-1951  DOA: 06/05/2016 PCP: [name], MD Outpatient Specialists:   Hospital course: With past medical history significant for diabetes, chronic anemia, coronary artery disease status post recent stent placement and chronic kidney disease presents to emergency room after fall. Patient states that she has fallen once every few days over the last 2 weeks. At baseline she uses a walker. She does not always use it as she is supposed to. Patient denies any prodrome prior to her falls. She states when this has occured her legs just get weak and shaky and then she loses her balance and topples over. Both times when she has fallen she has hit her head on the tile floor in her house. She denies any loss of consciousness. Patient does live alone. Patient was brought to the emergency room due to this fall by EMS and was found to have a femoral neck fracture on x-ray. Orthopedics consult in the emergency room And surgery is planned for tomorrow. She was found to be anemic and 2 units of bloodgiven prior to surgery.  Assessment & Plan: Acute Right femoral neck fracture Orthopedics consulted: Plan to OR tomorrow.  When necessary morphine 4 mg for severe pain Morphine 2 mg when necessary for moderate pain Nothing by mouth at midnight Surgery tentatively planned for 6/8. prolonged QT and sev anemia has been resolved now.  Pt should be able to go to OR 6/8.   Prolonged QT - resolved magnesium and phosphorus WNL. EKG repeated this morning within normal limits.  prolonged QTcResolved. Patient did have low potassium when she first arrived but has corrected. With correction of anemia and electrolytes EKG could normalize. If normal surgery can occur. If not consider cardiology consult. Revised cardiac risk index for preoperative risk score of 4, 11% risk of major cardiac event  Fall with head trauma & low H/H CT head and neck no acute  findings.  Patient with baseline mentation per family Likely due to deconditioning and acute worsening of her anemia currently being transfused 2 units started in the emergency room Hgb above 10 needed before surgery Currently holding aspirin and Plavix.  UTI Plan to treat with levofloxacin per pharmacy dosing as patient reports allergies to penicillins  Hypertension lisinopril 2.5 mg by mouth daily at bedtime Coreg 3.125 mg twice a day Imdur 60 mg daily  CKD Kidney function baseline Avoid nephrotoxic medication  CAD Nitroglycerin when necessary Holding aspirin and Plavix  Hyperlipidemia Zocor 20 mg daily at bedtime  Fibromyalgia and all over body tremor Gabapentin 300 mg 3 times a day Topamax 100 mg twice a day  Depressive disorder/anxiety Prozac 20 mg daily Xanax when necessary 3 times a day  Diabetes - type 1, insulin requiring Patient is a known brittle diabetic with history of severe hypoglycemia and DKA Lantus dose halfed to from 9 u to 5 u Q4 low-dose on a scale insulin, with orders to hold if NPO.  Recommend monitoring blood glucose closely during surgery and post-op.  Strict hypoglycemia precautions recommended.   Decreased hearing Likely due to cerumen Would start Debrox after surgery  Reflux Protonix 40 mg by mouth daily  Code Status:full code DVT Prophylaxis: SCD Family Communication: yes Disposition Plan: Pending Improvement   Consultants:  [facility]  Procedures:  pending  Antimicrobials:  levofloxacin   Subjective: Pt is in good spirits today.  Visitors in room.  No complaints.   Objective: Filed Vitals:  06/06/16 0110 06/06/16 0130 06/06/16 0426 06/06/16 0604  BP: 129/84 124/37 149/57 155/55  Pulse: 81 80 79 77  Temp: 98.4 F (36.9 C) 98 F (36.7 C) 98.5 F (36.9 C) 98.5 F (36.9 C)  TempSrc: Oral Oral Oral Oral  Resp: 18 18 16 17   Height:      Weight:      SpO2: 95% 95% 92% 93%    Intake/Output  Summary (Last 24 hours) at 06/06/16 0953 Last data filed at 06/06/16 0800  Gross per 24 hour  Intake   1125 ml  Output      0 ml  Net   1125 ml   Filed Weights   06/05/16 1737  Weight: 115 lb (52.164 kg)   Exam:  General exam: Pt is thin, emaciated appearing, awake, alert, no distress, cooperative.  Respiratory system: Clear. No increased work of breathing. Cardiovascular system: S1 & S2 heard, RRR. No JVD, murmurs, gallops, clicks or pedal edema. Gastrointestinal system: Abdomen is nondistended, soft and nontender. Normal bowel sounds heard. Central nervous system: Alert and oriented. No focal neurological deficits. Extremities: Right leg is externally rotated and shortened. NVI. Sensation intact distally. Other extremities are atraumatic with painless ROM and NVI.  Data Reviewed: Basic Metabolic Panel:  Recent Labs Lab 06/05/16 1740 06/05/16 1802 06/06/16 0652  NA 141  --  141  K 4.4  --  4.4  CL 111  --  110  CO2 23  --  25  GLUCOSE 130*  --  131*  BUN 50*  --  43*  CREATININE 2.08*  --  1.82*  CALCIUM 8.9  --  8.8*  MG  --  1.9  --   PHOS  --  3.8  --    Liver Function Tests:  Recent Labs Lab 06/05/16 1740  AST 28  ALT 24  ALKPHOS 45  BILITOT 0.6  PROT 5.4*  ALBUMIN 3.0*   No results for input(s): LIPASE, AMYLASE in the last 168 hours. No results for input(s): AMMONIA in the last 168 hours. CBC:  Recent Labs Lab 06/05/16 1740 06/06/16 0652  WBC 3.7* 6.0  NEUTROABS 2.5  --   HGB 7.3* 10.4*  HCT 22.6* 31.9*  MCV 96.2 88.9  PLT 170 108*   Cardiac Enzymes: No results for input(s): CKTOTAL, CKMB, CKMBINDEX, TROPONINI in the last 168 hours. BNP (last 3 results) No results for input(s): PROBNP in the last 8760 hours. CBG:  Recent Labs Lab 06/05/16 2059 06/06/16 0004 06/06/16 0423 06/06/16 0829  GLUCAP 136* 127* 156* 131*    Recent Results (from the past 240 hour(s))  Surgical pcr screen     Status: None   Collection Time: 06/06/16   2:00 AM  Result Value Ref Range Status   MRSA, PCR NEGATIVE NEGATIVE Final   Staphylococcus aureus NEGATIVE NEGATIVE Final    Comment:        The Xpert SA Assay (FDA approved for NASAL specimens in patients over 37 years of age), is one component of a comprehensive surveillance program.  Test performance has been validated by [facility] for patients greater than or equal to 73 year old. It is not intended to diagnose infection nor to guide or monitor treatment.      Studies: Dg Chest 1 View  06/05/2016  CLINICAL DATA:  Status post fall today with obvious deformity of the right leg. EXAM: CHEST 1 VIEW COMPARISON:  February 07, 2016. FINDINGS: The heart size and mediastinal contours are stable. The heart size  is mildly enlarged. Both lungs are clear. The visualized skeletal structures are stable. IMPRESSION: No active cardiopulmonary disease. Electronically Signed   By: Abelardo Diesel M.D.   On: 06/05/2016 18:36   Abd 1 View (kub)  06/06/2016  CLINICAL DATA:  Chronic diarrhea for 3-4 years. Large distal colonic stool burden on recent CT. EXAM: ABDOMEN - 1 VIEW COMPARISON:  CT on 03/11/2016 FINDINGS: No evidence of dilated bowel loops. A small to moderate amount of stool is seen within the descending and rectosigmoid colon. IMPRESSION: No acute findings.  Small to moderate stool burden again noted. Electronically Signed   By: Earle Gell M.D.   On: 06/06/2016 08:24   Ct Head Wo Contrast  06/05/2016  CLINICAL DATA:  65 year old female with fall and trauma to the head. EXAM: CT HEAD WITHOUT CONTRAST CT CERVICAL SPINE WITHOUT CONTRAST TECHNIQUE: Multidetector CT imaging of the head and cervical spine was performed following the standard protocol without intravenous contrast. Multiplanar CT image reconstructions of the cervical spine were also generated. COMPARISON:  Head CT dated 06/28/2015 FINDINGS: CT HEAD FINDINGS The ventricles and sulci are appropriate in size for patient's age. Mild  periventricular and deep white matter chronic microvascular ischemic changes noted. There is no acute intracranial hemorrhage. No mass effect or midline shift noted. There is partial opacification of the right sphenoid sinus. The remainder of the visualized paranasal sinuses and mastoid air cells are clear. The calvarium is intact. CT CERVICAL SPINE FINDINGS There is no acute fracture or subluxation of the cervical spine.There is osteopenia with minimal degenerative changes.The odontoid and spinous processes are intact.There is normal anatomic alignment of the C1-C2 lateral masses. The visualized soft tissues appear unremarkable. IMPRESSION: No acute intracranial hemorrhage. No Acute/traumatic cervical spine pathology. Electronically Signed   By: Anner Crete M.D.   On: 06/05/2016 20:34   Ct Cervical Spine Wo Contrast  06/05/2016  CLINICAL DATA:  65 year old female with fall and trauma to the head. EXAM: CT HEAD WITHOUT CONTRAST CT CERVICAL SPINE WITHOUT CONTRAST TECHNIQUE: Multidetector CT imaging of the head and cervical spine was performed following the standard protocol without intravenous contrast. Multiplanar CT image reconstructions of the cervical spine were also generated. COMPARISON:  Head CT dated 06/28/2015 FINDINGS: CT HEAD FINDINGS The ventricles and sulci are appropriate in size for patient's age. Mild periventricular and deep white matter chronic microvascular ischemic changes noted. There is no acute intracranial hemorrhage. No mass effect or midline shift noted. There is partial opacification of the right sphenoid sinus. The remainder of the visualized paranasal sinuses and mastoid air cells are clear. The calvarium is intact. CT CERVICAL SPINE FINDINGS There is no acute fracture or subluxation of the cervical spine.There is osteopenia with minimal degenerative changes.The odontoid and spinous processes are intact.There is normal anatomic alignment of the C1-C2 lateral masses. The visualized  soft tissues appear unremarkable. IMPRESSION: No acute intracranial hemorrhage. No Acute/traumatic cervical spine pathology. Electronically Signed   By: Anner Crete M.D.   On: 06/05/2016 20:34   Dg Hip Unilat  With Pelvis 2-3 Views Right  06/05/2016  CLINICAL DATA:  Status post fall today with right leg deformity. EXAM: DG HIP (WITH OR WITHOUT PELVIS) 2-3V RIGHT COMPARISON:  None. FINDINGS: There is displaced fracture of the right femoral neck. No other fracture is identified. There is no dislocation. IMPRESSION: Displaced fracture of the right femoral. Electronically Signed   By: Abelardo Diesel M.D.   On: 06/05/2016 18:35     Scheduled Meds: . sodium chloride  Intravenous STAT  . carvedilol  3.125 mg Oral BID WC  . FLUoxetine  20 mg Oral Daily  . gabapentin  600 mg Oral TID  . insulin aspart  0-9 Units Subcutaneous Q4H  . insulin glargine  5 Units Subcutaneous Daily  . isosorbide mononitrate  60 mg Oral Daily  . lisinopril  2.5 mg Oral QHS  . pantoprazole  40 mg Oral Daily  . simvastatin  20 mg Oral QHS  . 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

## 2016-06-07 ENCOUNTER — Encounter (HOSPITAL_COMMUNITY)
Admission: EM | Disposition: A | Discharge status: Skilled Nursing Facility | Payer: Self-pay | Source: Home / Self Care | Attending: Family Medicine

## 2016-06-07 ENCOUNTER — Inpatient Hospital Stay (HOSPITAL_COMMUNITY): Payer: Commercial Managed Care - HMO

## 2016-06-07 ENCOUNTER — Encounter (HOSPITAL_COMMUNITY): Payer: Self-pay | Admitting: Anesthesiology

## 2016-06-07 ENCOUNTER — Inpatient Hospital Stay (HOSPITAL_COMMUNITY): Payer: Commercial Managed Care - HMO | Admitting: Anesthesiology

## 2016-06-07 DIAGNOSIS — M797 Fibromyalgia: Secondary | ICD-10-CM

## 2016-06-07 DIAGNOSIS — F329 Major depressive disorder, single episode, unspecified: Secondary | ICD-10-CM

## 2016-06-07 DIAGNOSIS — Z794 Long term (current) use of insulin: Secondary | ICD-10-CM

## 2016-06-07 DIAGNOSIS — E119 Type 2 diabetes mellitus without complications: Secondary | ICD-10-CM

## 2016-06-07 HISTORY — PX: HIP ARTHROPLASTY: SHX981

## 2016-06-07 LAB — GLUCOSE, CAPILLARY
GLUCOSE-CAPILLARY: 120 mg/dL — AB (ref 65–99)
GLUCOSE-CAPILLARY: 156 mg/dL — AB (ref 65–99)
GLUCOSE-CAPILLARY: 196 mg/dL — AB (ref 65–99)
GLUCOSE-CAPILLARY: 63 mg/dL — AB (ref 65–99)
GLUCOSE-CAPILLARY: 88 mg/dL (ref 65–99)
Glucose-Capillary: 81 mg/dL (ref 65–99)
Glucose-Capillary: 57 mg/dL — ABNORMAL LOW (ref 65–99)
Glucose-Capillary: 105 mg/dL — ABNORMAL HIGH (ref 65–99)

## 2016-06-07 LAB — PREALBUMIN: Prealbumin: 13.1 mg/dL — ABNORMAL LOW (ref 18–38)

## 2016-06-07 LAB — CBC WITH DIFFERENTIAL/PLATELET
BASOS ABS: 0 10*3/uL (ref 0.0–0.1)
BASOS PCT: 0 %
EOS ABS: 0.1 10*3/uL (ref 0.0–0.7)
EOS PCT: 1 %
HCT: 30.7 % — ABNORMAL LOW (ref 36.0–46.0)
Hemoglobin: 10.1 g/dL — ABNORMAL LOW (ref 12.0–15.0)
Lymphocytes Relative: 12 %
Lymphs Abs: 0.7 10*3/uL (ref 0.7–4.0)
MCH: 29.1 pg (ref 26.0–34.0)
MCHC: 32.9 g/dL (ref 30.0–36.0)
MCV: 88.5 fL (ref 78.0–100.0)
Monocytes Absolute: 0.4 10*3/uL (ref 0.1–1.0)
Monocytes Relative: 7 %
Neutro Abs: 4.7 10*3/uL (ref 1.7–7.7)
Neutrophils Relative %: 80 %
PLATELETS: 113 10*3/uL — AB (ref 150–400)
RBC: 3.47 MIL/uL — AB (ref 3.87–5.11)
RDW: 17.9 % — ABNORMAL HIGH (ref 11.5–15.5)
WBC: 5.8 10*3/uL (ref 4.0–10.5)

## 2016-06-07 LAB — APTT: APTT: 36 s (ref 24–37)

## 2016-06-07 LAB — PREPARE RBC (CROSSMATCH)

## 2016-06-07 LAB — COMPREHENSIVE METABOLIC PANEL
ALBUMIN: 2.4 g/dL — AB (ref 3.5–5.0)
ALT: 19 U/L (ref 14–54)
AST: 23 U/L (ref 15–41)
Alkaline Phosphatase: 30 U/L — ABNORMAL LOW (ref 38–126)
Anion gap: 4 — ABNORMAL LOW (ref 5–15)
BILIRUBIN TOTAL: 1.1 mg/dL (ref 0.3–1.2)
BUN: 28 mg/dL — AB (ref 6–20)
CHLORIDE: 110 mmol/L (ref 101–111)
CO2: 21 mmol/L — ABNORMAL LOW (ref 22–32)
CREATININE: 1.51 mg/dL — AB (ref 0.44–1.00)
Calcium: 8 mg/dL — ABNORMAL LOW (ref 8.9–10.3)
GFR calc Af Amer: 40 mL/min — ABNORMAL LOW (ref >60)
GFR, EST NON AFRICAN AMERICAN: 35 mL/min — AB (ref >60)
GLUCOSE: 80 mg/dL (ref 65–99)
POTASSIUM: 3.6 mmol/L (ref 3.5–5.1)
Sodium: 135 mmol/L (ref 135–145)
TOTAL PROTEIN: 4.6 g/dL — AB (ref 6.5–8.1)

## 2016-06-07 LAB — PROTIME-INR
INR: 1.24 (ref 0.00–1.49)
PROTHROMBIN TIME: 15.7 s — AB (ref 11.6–15.2)

## 2016-06-07 LAB — HEMOGLOBIN A1C
Hgb A1c MFr Bld: 7.6 % — ABNORMAL HIGH (ref 4.8–5.6)
Mean Plasma Glucose: 171 mg/dL

## 2016-06-07 LAB — TSH: TSH: 1.933 u[IU]/mL (ref 0.350–4.500)

## 2016-06-07 SURGERY — HEMIARTHROPLASTY, HIP, DIRECT ANTERIOR APPROACH, FOR FRACTURE
Anesthesia: General | Site: Hip | Laterality: Right

## 2016-06-07 MED ORDER — SODIUM CHLORIDE 0.9 % IV SOLN
INTRAVENOUS | Status: DC | PRN
  Administered 2016-06-07: 20:00:00 via INTRAVENOUS

## 2016-06-07 MED ORDER — HYDROCODONE-ACETAMINOPHEN 5-325 MG PO TABS
1.0000 | ORAL_TABLET | Freq: Four times a day (QID) | ORAL | Status: DC | PRN
  Administered 2016-06-08: 2 via ORAL
  Administered 2016-06-08: 1 via ORAL
  Filled 2016-06-07: qty 1
  Filled 2016-06-07: qty 2

## 2016-06-07 MED ORDER — METOCLOPRAMIDE HCL 5 MG PO TABS: 5.0000 mg | ORAL_TABLET | Freq: Three times a day (TID) | ORAL | Status: DC | PRN

## 2016-06-07 MED ORDER — LIDOCAINE HCL (CARDIAC) 20 MG/ML IV SOLN
INTRAVENOUS | Status: DC | PRN
  Administered 2016-06-07: 50 mg via INTRAVENOUS

## 2016-06-07 MED ORDER — LACTATED RINGERS IV SOLN
INTRAVENOUS | Status: DC | PRN
  Administered 2016-06-07: 18:00:00 via INTRAVENOUS

## 2016-06-07 MED ORDER — ENOXAPARIN SODIUM 30 MG/0.3ML ~~LOC~~ SOLN
30.0000 mg | SUBCUTANEOUS | Status: DC
  Administered 2016-06-08 – 2016-06-11 (×4): 30 mg via SUBCUTANEOUS
  Filled 2016-06-07 (×4): qty 0.3

## 2016-06-07 MED ORDER — ACETAMINOPHEN 325 MG PO TABS
650.0000 mg | ORAL_TABLET | Freq: Four times a day (QID) | ORAL | Status: DC | PRN
  Administered 2016-06-09 – 2016-06-10 (×3): 650 mg via ORAL
  Filled 2016-06-07 (×3): qty 2

## 2016-06-07 MED ORDER — CEFAZOLIN SODIUM-DEXTROSE 2-4 GM/100ML-% IV SOLN
2.0000 g | Freq: Four times a day (QID) | INTRAVENOUS | Status: AC
  Administered 2016-06-08 (×2): 2 g via INTRAVENOUS
  Filled 2016-06-07 (×2): qty 100

## 2016-06-07 MED ORDER — FENTANYL CITRATE (PF) 100 MCG/2ML IJ SOLN
25.0000 ug | INTRAMUSCULAR | Status: DC | PRN
  Administered 2016-06-07: 25 ug via INTRAVENOUS

## 2016-06-07 MED ORDER — PROPOFOL 10 MG/ML IV BOLUS
INTRAVENOUS | Status: AC
  Filled 2016-06-07: qty 20

## 2016-06-07 MED ORDER — ACETAMINOPHEN 650 MG RE SUPP: 650.0000 mg | Freq: Four times a day (QID) | RECTAL | Status: DC | PRN

## 2016-06-07 MED ORDER — FENTANYL CITRATE (PF) 250 MCG/5ML IJ SOLN
INTRAMUSCULAR | Status: AC
  Filled 2016-06-07: qty 5

## 2016-06-07 MED ORDER — SODIUM CHLORIDE 0.9 % IR SOLN
Status: DC | PRN
  Administered 2016-06-07: 1

## 2016-06-07 MED ORDER — FENTANYL CITRATE (PF) 100 MCG/2ML IJ SOLN
INTRAMUSCULAR | Status: AC
  Filled 2016-06-07: qty 2

## 2016-06-07 MED ORDER — FENTANYL CITRATE (PF) 100 MCG/2ML IJ SOLN
INTRAMUSCULAR | Status: DC | PRN
  Administered 2016-06-07: 100 ug via INTRAVENOUS
  Administered 2016-06-07: 50 ug via INTRAVENOUS

## 2016-06-07 MED ORDER — MIDAZOLAM HCL 2 MG/2ML IJ SOLN
INTRAMUSCULAR | Status: AC
  Filled 2016-06-07: qty 2

## 2016-06-07 MED ORDER — PHENOL 1.4 % MT LIQD: 1.0000 | OROMUCOSAL | Status: DC | PRN

## 2016-06-07 MED ORDER — ONDANSETRON HCL 4 MG/2ML IJ SOLN
INTRAMUSCULAR | Status: DC | PRN
  Administered 2016-06-07: 4 mg via INTRAVENOUS

## 2016-06-07 MED ORDER — DEXTROSE 50 % IV SOLN
25.0000 mL | Freq: Once | INTRAVENOUS | Status: AC
  Administered 2016-06-07: 25 mL via INTRAVENOUS
  Filled 2016-06-07: qty 50

## 2016-06-07 MED ORDER — PROPOFOL 10 MG/ML IV BOLUS
INTRAVENOUS | Status: DC | PRN
  Administered 2016-06-07: 75 mg via INTRAVENOUS

## 2016-06-07 MED ORDER — DEXTROSE 50 % IV SOLN
INTRAVENOUS | Status: AC
  Administered 2016-06-07: 50 mL
  Filled 2016-06-07: qty 50

## 2016-06-07 MED ORDER — SUGAMMADEX SODIUM 200 MG/2ML IV SOLN
INTRAVENOUS | Status: DC | PRN
  Administered 2016-06-07: 104.4 mg via INTRAVENOUS

## 2016-06-07 MED ORDER — LACTATED RINGERS IV SOLN
INTRAVENOUS | Status: DC
  Administered 2016-06-07: 17:00:00 via INTRAVENOUS

## 2016-06-07 MED ORDER — PROMETHAZINE HCL 25 MG/ML IJ SOLN: 6.2500 mg | INTRAMUSCULAR | Status: DC | PRN

## 2016-06-07 MED ORDER — SODIUM CHLORIDE 0.9 % IV SOLN
10.0000 mL/h | Freq: Once | INTRAVENOUS | Status: AC
  Administered 2016-06-07: 18:00:00 via INTRAVENOUS

## 2016-06-07 MED ORDER — METOCLOPRAMIDE HCL 5 MG/ML IJ SOLN: 5.0000 mg | Freq: Three times a day (TID) | INTRAMUSCULAR | Status: DC | PRN

## 2016-06-07 MED ORDER — DEXTROSE 50 % IV SOLN
INTRAVENOUS | Status: AC
  Filled 2016-06-07: qty 50

## 2016-06-07 MED ORDER — MEPERIDINE HCL 25 MG/ML IJ SOLN
INTRAMUSCULAR | Status: AC
  Filled 2016-06-07: qty 1

## 2016-06-07 MED ORDER — ONDANSETRON HCL 4 MG PO TABS: 4.0000 mg | ORAL_TABLET | Freq: Four times a day (QID) | ORAL | Status: DC | PRN

## 2016-06-07 MED ORDER — ROCURONIUM BROMIDE 100 MG/10ML IV SOLN
INTRAVENOUS | Status: DC | PRN
  Administered 2016-06-07: 30 mg via INTRAVENOUS
  Administered 2016-06-07: 10 mg via INTRAVENOUS

## 2016-06-07 MED ORDER — SUGAMMADEX SODIUM 200 MG/2ML IV SOLN
INTRAVENOUS | Status: AC
  Filled 2016-06-07: qty 2

## 2016-06-07 MED ORDER — MENTHOL 3 MG MT LOZG: 1.0000 | LOZENGE | OROMUCOSAL | Status: DC | PRN

## 2016-06-07 MED ORDER — ONDANSETRON HCL 4 MG/2ML IJ SOLN
4.0000 mg | Freq: Four times a day (QID) | INTRAMUSCULAR | Status: DC | PRN
  Administered 2016-06-08 – 2016-06-09 (×2): 4 mg via INTRAVENOUS
  Filled 2016-06-07 (×2): qty 2

## 2016-06-07 MED ORDER — MORPHINE SULFATE (PF) 2 MG/ML IV SOLN
0.5000 mg | INTRAVENOUS | Status: DC | PRN
  Administered 2016-06-08 – 2016-06-11 (×4): 1 mg via INTRAVENOUS
  Filled 2016-06-07 (×4): qty 1

## 2016-06-07 MED ORDER — MEPERIDINE HCL 25 MG/ML IJ SOLN
6.2500 mg | INTRAMUSCULAR | Status: DC | PRN
  Administered 2016-06-07 (×2): 6.25 mg via INTRAVENOUS

## 2016-06-07 SURGICAL SUPPLY — 53 items
BLADE SAW SAG 73X25 THK (BLADE) ×2
BLADE SAW SGTL 73X25 THK (BLADE) ×1 IMPLANT
BNDG COHESIVE 4X5 TAN STRL (GAUZE/BANDAGES/DRESSINGS) ×2 IMPLANT
BRUSH FEMORAL CANAL (MISCELLANEOUS) IMPLANT
BRUSH SCRUB DISP (MISCELLANEOUS) ×6 IMPLANT
CAPT HIP HEMI 2 ×2 IMPLANT
COVER SURGICAL LIGHT HANDLE (MISCELLANEOUS) ×3 IMPLANT
DRAPE INCISE IOBAN 85X60 (DRAPES) ×3 IMPLANT
DRAPE ORTHO SPLIT 77X108 STRL (DRAPES) ×6
DRAPE SURG ORHT 6 SPLT 77X108 (DRAPES) ×2 IMPLANT
DRAPE U-SHAPE 47X51 STRL (DRAPES) ×3 IMPLANT
DRILL BIT 7/64X5 (BIT) ×3 IMPLANT
DRSG AQUACEL AG ADV 3.5X10 (GAUZE/BANDAGES/DRESSINGS) ×2 IMPLANT
ELECT BLADE 6.5 EXT (BLADE) IMPLANT
ELECT REM PT RETURN 9FT ADLT (ELECTROSURGICAL) ×3
ELECTRODE REM PT RTRN 9FT ADLT (ELECTROSURGICAL) ×1 IMPLANT
GLOVE BIO SURGEON STRL SZ7.5 (GLOVE) ×3 IMPLANT
GLOVE BIO SURGEON STRL SZ8 (GLOVE) ×3 IMPLANT
GLOVE BIOGEL PI IND STRL 8 (GLOVE) ×2 IMPLANT
GLOVE BIOGEL PI INDICATOR 8 (GLOVE) ×4
GOWN STRL REUS W/ TWL LRG LVL3 (GOWN DISPOSABLE) ×2 IMPLANT
GOWN STRL REUS W/ TWL XL LVL3 (GOWN DISPOSABLE) ×1 IMPLANT
GOWN STRL REUS W/TWL 2XL LVL3 (GOWN DISPOSABLE) IMPLANT
GOWN STRL REUS W/TWL LRG LVL3 (GOWN DISPOSABLE) ×6
GOWN STRL REUS W/TWL XL LVL3 (GOWN DISPOSABLE) ×3
HANDPIECE INTERPULSE COAX TIP (DISPOSABLE)
KIT BASIN OR (CUSTOM PROCEDURE TRAY) ×3 IMPLANT
KIT ROOM TURNOVER OR (KITS) ×3 IMPLANT
MANIFOLD NEPTUNE II (INSTRUMENTS) ×3 IMPLANT
NDL 1/2 CIR MAYO (NEEDLE) IMPLANT
NEEDLE 1/2 CIR MAYO (NEEDLE) IMPLANT
NS IRRIG 1000ML POUR BTL (IV SOLUTION) ×3 IMPLANT
PACK TOTAL JOINT (CUSTOM PROCEDURE TRAY) ×3 IMPLANT
PAD ARMBOARD 7.5X6 YLW CONV (MISCELLANEOUS) ×6 IMPLANT
PILLOW ABDUCTION HIP (SOFTGOODS) IMPLANT
PRESSURIZER FEMORAL UNIV (MISCELLANEOUS) IMPLANT
RETRIEVER SUT HEWSON (MISCELLANEOUS) ×3 IMPLANT
SET HNDPC FAN SPRY TIP SCT (DISPOSABLE) IMPLANT
STAPLER VISISTAT 35W (STAPLE) ×3 IMPLANT
SUT ETHILON 2 0 FS 18 (SUTURE) ×4 IMPLANT
SUT ETHILON 2 0 PSLX (SUTURE) ×6 IMPLANT
SUT FIBERWIRE 2-0 18 17.9 3/8 (SUTURE) ×3
SUT VIC AB 1 CT1 18XCR BRD 8 (SUTURE) IMPLANT
SUT VIC AB 1 CT1 27 (SUTURE) ×6
SUT VIC AB 1 CT1 27XBRD ANBCTR (SUTURE) ×1 IMPLANT
SUT VIC AB 1 CT1 8-18 (SUTURE) ×3
SUT VIC AB 1 CTX 18 (SUTURE) ×3 IMPLANT
SUT VIC AB 2-0 CT1 27 (SUTURE) ×12
SUT VIC AB 2-0 CT1 TAPERPNT 27 (SUTURE) ×2 IMPLANT
SUTURE FIBERWR 2-0 18 17.9 3/8 (SUTURE) IMPLANT
TOWEL OR 17X24 6PK STRL BLUE (TOWEL DISPOSABLE) ×5 IMPLANT
TOWEL OR 17X26 10 PK STRL BLUE (TOWEL DISPOSABLE) ×3 IMPLANT
TOWER CARTRIDGE SMART MIX (DISPOSABLE) IMPLANT

## 2016-06-07 NOTE — Anesthesia Preprocedure Evaluation (Addendum)
Anesthesia Evaluation  Patient identified by MRN, date of birth, ID band Patient awake    Reviewed: Allergy & Precautions, NPO status , Patient's Chart, lab work & pertinent test results, reviewed documented beta blocker date and time   Airway Mallampati: II   Neck ROM: Full    Dental  (+) Teeth Intact, Dental Advisory Given   Pulmonary neg pulmonary ROS,    breath sounds clear to auscultation       Cardiovascular hypertension, Pt. on medications and Pt. on home beta blockers + CAD, + Past MI and + Cardiac Stents   Rhythm:Regular  ECHO 2016 EF 60% valves OK   Neuro/Psych  Headaches, Anxiety Depression    GI/Hepatic PUD, GERD  ,  Endo/Other  diabetes, Type 2, Insulin Dependent  Renal/GU Renal InsufficiencyRenal diseaseCreat 1.4     Musculoskeletal  (+) Arthritis , Fibromyalgia -  Abdominal   Peds  Hematology  (+) Blood dyscrasia, anemia , Plavix on 6/5   Anesthesia Other Findings   Reproductive/Obstetrics                           Lab Results  Component Value Date   WBC 5.8 06/07/2016   HGB 10.1* 06/07/2016   HCT 30.7* 06/07/2016   MCV 88.5 06/07/2016   PLT 113* 06/07/2016   Lab Results  Component Value Date   CREATININE 1.51* 06/07/2016   BUN 28* 06/07/2016   NA 135 06/07/2016   K 3.6 06/07/2016   CL 110 06/07/2016   CO2 21* 06/07/2016    Anesthesia Physical Anesthesia Plan  ASA: III  Anesthesia Plan: General   Post-op Pain Management:    Induction: Intravenous  Airway Management Planned: Oral ETT  Additional Equipment:   Intra-op Plan:   Post-operative Plan: Extubation in OR  Informed Consent: I have reviewed the patients History and Physical, chart, labs and discussed the procedure including the risks, benefits and alternatives for the proposed anesthesia with the patient or authorized representative who has indicated his/her understanding and acceptance.    Dental advisory given  Plan Discussed with: CRNA  Anesthesia Plan Comments: (Would change T & S to T & C)       Anesthesia Quick Evaluation

## 2016-06-07 NOTE — Anesthesia Procedure Notes (Signed)
Procedure Name: Intubation Date/Time: 06/07/2016 7:08 PM Performed by: Manus Gunning, Khalea Ventura J Pre-anesthesia Checklist: Patient identified, Timeout performed, Emergency Drugs available, Suction available and Patient being monitored Patient Re-evaluated:Patient Re-evaluated prior to inductionOxygen Delivery Method: Circle system utilized Preoxygenation: Pre-oxygenation with 100% oxygen Intubation Type: IV induction Ventilation: Mask ventilation without difficulty Laryngoscope Size: Mac and 3 Grade View: Grade I Tube size: 7.0 mm Number of attempts: 1 Placement Confirmation: ETT inserted through vocal cords under direct vision and breath sounds checked- equal and bilateral Secured at: 21 cm Tube secured with: Tape Dental Injury: Teeth and Oropharynx as per pre-operative assessment

## 2016-06-07 NOTE — Progress Notes (Signed)
CBG  57. Reported to Dr Tresa Moore. Order for 1/2 amp of d50 given verbally from Dr. Tresa Moore. 1/2 amp d50 given IV.

## 2016-06-07 NOTE — Progress Notes (Signed)
PROGRESS NOTE  06/07/2016 2:39 PM   [name]  [id number]  DOB: 1951-10-24  DOA: 06/05/2016 PCP: [name], MD Outpatient Specialists:   Hospital course: With past medical history significant for diabetes, chronic anemia, coronary artery disease status post recent stent placement and chronic kidney disease presents to emergency room after fall. Patient states that she has fallen once every few days over the last 2 weeks. At baseline she uses a walker. She does not always use it as she is supposed to. Patient denies any prodrome prior to her falls. She states when this has occured her legs just get weak and shaky and then she loses her balance and topples over. Both times when she has fallen she has hit her head on the tile floor in her house. She denies any loss of consciousness. Patient does live alone. Patient was brought to the emergency room due to this fall by EMS and was found to have a femoral neck fracture on x-ray. Orthopedics consult in the emergency room and surgery is planned for 6/8. She was found to be anemic and 2 units of bloodgiven prior to surgery.  Assessment & Plan: Acute Right femoral neck fracture Orthopedics consulted: Plan to OR on 06/07/16. When necessary morphine 4 mg for severe pain Morphine 2 mg when necessary for moderate pain Nothing by mouth at midnight Surgery tentatively planned for 6/8. prolonged QT and severe anemia has been resolved now.    Prolonged QT - resolved magnesium and phosphorus WNL. EKG repeated this morning within normal limits.  prolonged QTcResolved. Patient did have low potassium when she first arrived but has corrected. With correction of anemia and electrolytes EKG could normalize. If normal surgery can occur. If not consider cardiology consult. Revised cardiac risk index for preoperative risk score of 4, 11% risk of major cardiac event  Fall with head trauma & low H/H CT head and neck no acute findings.  Patient  with baseline mentation per family Likely due to deconditioning and acute worsening of her anemia currently being transfused 2 units started in the emergency room Hgb above 10 needed before surgery Currently holding aspirin and Plavix.  UTI Plan to treat with levofloxacin per pharmacy dosing as patient reports allergies to penicillins  Hypertension lisinopril 2.5 mg by mouth daily at bedtime Coreg 3.125 mg twice a day Imdur 60 mg daily  CKD Kidney function baseline Avoid nephrotoxic medication  CAD Nitroglycerin when necessary Holding aspirin and Plavix  Hyperlipidemia Zocor 20 mg daily at bedtime  Fibromyalgia and all over body tremor Gabapentin 300 mg 3 times a day Topamax 100 mg twice a day  Depressive disorder/anxiety Prozac 20 mg daily Xanax when necessary 3 times a day  Diabetes - type 2, nsulin requiring Patient is a known brittle diabetic with history of severe hypoglycemia and reports Lantus dose reduced from 9 u to 5 u Q4 low-dose on a scale insulin, with orders to hold if NPO.  Recommend monitoring blood glucose closely during surgery and post-op.  Strict hypoglycemia precautions recommended.   Decreased hearing Likely due to cerumen Would start Debrox after surgery  Reflux Protonix 40 mg by mouth daily  Code Status: full code DVT Prophylaxis: SCD Family Communication: yes Disposition Plan: PT consult ordered for tomorrow  Consultants:  [facility]  Procedures:  pending  Antimicrobials:  levofloxacin   Subjective: Pt is anxious about surgery today.  No complaints.   Objective: Filed Vitals:   06/06/16 1500 06/06/16 2133 06/07/16 0653 06/07/16 1027  BP: 151/66 140/83 154/66 139/71  Pulse: 81 87 94 82  Temp: 98.7 F (37.1 C) 99 F (37.2 C) 98.2 F (36.8 C) 98.9 F (37.2 C)  TempSrc:  Oral Oral Oral  Resp: 16 17 16 18   Height:      Weight:      SpO2: 94% 97% 97% 98%    Intake/Output Summary (Last 24 hours) at  06/07/16 1438 Last data filed at 06/07/16 1249  Gross per 24 hour  Intake    658 ml  Output      0 ml  Net    658 ml   Filed Weights   06/05/16 1737  Weight: 115 lb (52.164 kg)   Exam:  General exam: Pt is thin, emaciated appearing, awake, alert, no distress, cooperative.  Respiratory system: Clear. No increased work of breathing. Cardiovascular system: S1 & S2 heard, RRR. No JVD, murmurs, gallops, clicks or pedal edema. Gastrointestinal system: Abdomen is nondistended, soft and nontender. Normal bowel sounds heard. Central nervous system: Alert and oriented. No focal neurological deficits. Extremities: Right leg is externally rotated and shortened. NVI. Sensation intact distally. Other extremities are atraumatic with painless ROM and NVI.  Data Reviewed: Basic Metabolic Panel:  Recent Labs Lab 06/05/16 1740 06/05/16 1802 06/06/16 0652 06/07/16 0350  NA 141  --  141 135  K 4.4  --  4.4 3.6  CL 111  --  110 110  CO2 23  --  25 21*  GLUCOSE 130*  --  131* 80  BUN 50*  --  43* 28*  CREATININE 2.08*  --  1.82* 1.51*  CALCIUM 8.9  --  8.8* 8.0*  MG  --  1.9  --   --   PHOS  --  3.8  --   --    Liver Function Tests:  Recent Labs Lab 06/05/16 1740 06/07/16 0350  AST 28 23  ALT 24 19  ALKPHOS 45 30*  BILITOT 0.6 1.1  PROT 5.4* 4.6*  ALBUMIN 3.0* 2.4*   No results for input(s): LIPASE, AMYLASE in the last 168 hours. No results for input(s): AMMONIA in the last 168 hours. CBC:  Recent Labs Lab 06/05/16 1740 06/06/16 0652 06/07/16 0350  WBC 3.7* 6.0 5.8  NEUTROABS 2.5  --  4.7  HGB 7.3* 10.4* 10.1*  HCT 22.6* 31.9* 30.7*  MCV 96.2 88.9 88.5  PLT 170 108* 113*   Cardiac Enzymes: No results for input(s): CKTOTAL, CKMB, CKMBINDEX, TROPONINI in the last 168 hours. BNP (last 3 results) No results for input(s): PROBNP in the last 8760 hours. CBG:  Recent Labs Lab 06/07/16 0020 06/07/16 0405 06/07/16 0921 06/07/16 1009 06/07/16 1138  GLUCAP 120* 88  63* 196* 156*    Recent Results (from the past 240 hour(s))  Surgical pcr screen     Status: None   Collection Time: 06/06/16  2:00 AM  Result Value Ref Range Status   MRSA, PCR NEGATIVE NEGATIVE Final   Staphylococcus aureus NEGATIVE NEGATIVE Final    Comment:        The Xpert SA Assay (FDA approved for NASAL specimens in patients over 25 years of age), is one component of a comprehensive surveillance program.  Test performance has been validated by [facility] for patients greater than or equal to 78 year old. It is not intended to diagnose infection nor to guide or monitor treatment.      Studies: Dg Chest 1 View  06/05/2016  CLINICAL DATA:  Status post fall today  with obvious deformity of the right leg. EXAM: CHEST 1 VIEW COMPARISON:  February 07, 2016. FINDINGS: The heart size and mediastinal contours are stable. The heart size is mildly enlarged. Both lungs are clear. The visualized skeletal structures are stable. IMPRESSION: No active cardiopulmonary disease. Electronically Signed   By: Abelardo Diesel M.D.   On: 06/05/2016 18:36   Abd 1 View (kub)  06/06/2016  CLINICAL DATA:  Chronic diarrhea for 3-4 years. Large distal colonic stool burden on recent CT. EXAM: ABDOMEN - 1 VIEW COMPARISON:  CT on 03/11/2016 FINDINGS: No evidence of dilated bowel loops. A small to moderate amount of stool is seen within the descending and rectosigmoid colon. IMPRESSION: No acute findings.  Small to moderate stool burden again noted. Electronically Signed   By: Earle Gell M.D.   On: 06/06/2016 08:24   Ct Head Wo Contrast  06/05/2016  CLINICAL DATA:  65 year old female with fall and trauma to the head. EXAM: CT HEAD WITHOUT CONTRAST CT CERVICAL SPINE WITHOUT CONTRAST TECHNIQUE: Multidetector CT imaging of the head and cervical spine was performed following the standard protocol without intravenous contrast. Multiplanar CT image reconstructions of the cervical spine were also generated. COMPARISON:   Head CT dated 06/28/2015 FINDINGS: CT HEAD FINDINGS The ventricles and sulci are appropriate in size for patient's age. Mild periventricular and deep white matter chronic microvascular ischemic changes noted. There is no acute intracranial hemorrhage. No mass effect or midline shift noted. There is partial opacification of the right sphenoid sinus. The remainder of the visualized paranasal sinuses and mastoid air cells are clear. The calvarium is intact. CT CERVICAL SPINE FINDINGS There is no acute fracture or subluxation of the cervical spine.There is osteopenia with minimal degenerative changes.The odontoid and spinous processes are intact.There is normal anatomic alignment of the C1-C2 lateral masses. The visualized soft tissues appear unremarkable. IMPRESSION: No acute intracranial hemorrhage. No Acute/traumatic cervical spine pathology. Electronically Signed   By: Anner Crete M.D.   On: 06/05/2016 20:34   Ct Cervical Spine Wo Contrast  06/05/2016  CLINICAL DATA:  65 year old female with fall and trauma to the head. EXAM: CT HEAD WITHOUT CONTRAST CT CERVICAL SPINE WITHOUT CONTRAST TECHNIQUE: Multidetector CT imaging of the head and cervical spine was performed following the standard protocol without intravenous contrast. Multiplanar CT image reconstructions of the cervical spine were also generated. COMPARISON:  Head CT dated 06/28/2015 FINDINGS: CT HEAD FINDINGS The ventricles and sulci are appropriate in size for patient's age. Mild periventricular and deep white matter chronic microvascular ischemic changes noted. There is no acute intracranial hemorrhage. No mass effect or midline shift noted. There is partial opacification of the right sphenoid sinus. The remainder of the visualized paranasal sinuses and mastoid air cells are clear. The calvarium is intact. CT CERVICAL SPINE FINDINGS There is no acute fracture or subluxation of the cervical spine.There is osteopenia with minimal degenerative  changes.The odontoid and spinous processes are intact.There is normal anatomic alignment of the C1-C2 lateral masses. The visualized soft tissues appear unremarkable. IMPRESSION: No acute intracranial hemorrhage. No Acute/traumatic cervical spine pathology. Electronically Signed   By: Anner Crete M.D.   On: 06/05/2016 20:34   Dg Knee Right Port  06/06/2016  CLINICAL DATA:  Femoral neck fracture, fall this morning EXAM: PORTABLE RIGHT KNEE - 1-2 VIEW COMPARISON:  None FINDINGS: Three views of the right knee submitted. No acute fracture or subluxation. There is diffuse osteopenia. Mild narrowing of medial joint compartment. Small joint effusion. Atherosclerotic calcifications of femoral  and popliteal artery. IMPRESSION: No acute fracture or subluxation. Mild degenerative changes. Small joint effusion. Diffuse osteopenia. Electronically Signed   By: Lahoma Crocker M.D.   On: 06/06/2016 12:10   Dg Hip Unilat  With Pelvis 2-3 Views Right  06/05/2016  CLINICAL DATA:  Status post fall today with right leg deformity. EXAM: DG HIP (WITH OR WITHOUT PELVIS) 2-3V RIGHT COMPARISON:  None. FINDINGS: There is displaced fracture of the right femoral neck. No other fracture is identified. There is no dislocation. IMPRESSION: Displaced fracture of the right femoral. Electronically Signed   By: Abelardo Diesel M.D.   On: 06/05/2016 18:35     Scheduled Meds: . [COMPLETED] sodium chloride   Intravenous STAT  . carvedilol  3.125 mg Oral BID WC  .  ceFAZolin (ANCEF) IV  2 g Intravenous Once  . FLUoxetine  20 mg Oral Daily  . gabapentin  600 mg Oral TID  . insulin aspart  0-9 Units Subcutaneous Q4H  . insulin glargine  5 Units Subcutaneous Daily  . isosorbide mononitrate  60 mg Oral Daily  . levofloxacin  500 mg Oral Q48H  . lisinopril  2.5 mg Oral QHS  . pantoprazole  40 mg Oral Daily  . simvastatin  20 mg Oral QHS  . topiramate  100 mg Oral BID   Continuous Infusions:   Principal Problem:   Fracture of femoral  neck, right (HCC) Active Problems:   Symptomatic anemia   CKD (chronic kidney disease) stage 4, GFR 15-29 ml/min (HCC)   DM w/o complication type II (HCC)   HLD (hyperlipidemia)   Depressive disorder   HTN (hypertension)   CAD (coronary artery disease)   Fibromyalgia   GERD (gastroesophageal reflux disease)   Head trauma   Decreased hearing of both ears   Hip fracture ([place])   Femoral neck fracture ([place])  Time spent:   Irwin Brakeman, MD, FAAFP [facility] Pager [phone]  If 7PM-7AM, please contact night-coverage [email or web address] Password [email or web address] 06/07/2016, 2:38 PM    LOS: 2 days

## 2016-06-07 NOTE — Progress Notes (Signed)
Hypoglycemic Event  CBG: 65  Treatment: 15 GM carbohydrate snack  Symptoms: Pale  Follow-up CBG: Time:2100 CBG Result:75  Possible Reasons for Event: Inadequate meal intake  Comments/MD notified: CBG rechecked again at [place], [place]

## 2016-06-07 NOTE — Transfer of Care (Signed)
Immediate Anesthesia Transfer of Care Note  Patient: [name]  Procedure(s) Performed: Procedure(s): ARTHROPLASTY BIPOLAR HIP (HEMIARTHROPLASTY) (Right)  Patient Location: PACU  Anesthesia Type:General  Level of Consciousness: awake  Airway & Oxygen Therapy: Patient Spontanous Breathing and Patient connected to nasal cannula oxygen  Post-op Assessment: Report given to RN and Post -op Vital signs reviewed and stable  Post vital signs: Reviewed and stable  Last Vitals:  Filed Vitals:   06/07/16 0653 06/07/16 1027  BP: 154/66 139/71  Pulse: 94 82  Temp: 36.8 C 37.2 C  Resp: 16 18    Last Pain:  Filed Vitals:   06/07/16 1719  PainSc: 6       Patients Stated Pain Goal: 4 ([id number])  Complications: No apparent anesthesia complications

## 2016-06-07 NOTE — Progress Notes (Signed)
Inpatient Diabetes Program Recommendations  AACE/ADA: New Consensus Statement on Inpatient Glycemic Control (2015)  Target Ranges:  Prepandial:   less than 140 mg/dL      Peak postprandial:   less than 180 mg/dL (1-2 hours)      Critically ill patients:  140 - 180 mg/dL   Lab Results  Component Value Date   GLUCAP 156* 06/07/2016   HGBA1C 7.6* 06/05/2016    Review of Glycemic Control  Inpatient Diabetes Program Recommendations: Type-1 vs Type-2?    Outpatient Referral: needs one-on-one for carb counting and insulin coverage This coordinator spoke with patient concerning glycemic control at home.  Pt states her glucose is variable and hard to control.  She states she has been taking Lantus 9 units at home and her fasting CBGs are still 200 range at times.  States she did not have a hypoglycemic episode when she fell.  States she has been "weak and wobbly" for "awhile now".  She and family state they have alerted her outpatient physicians (PCP, cardiology, oncology) of her weakness but it has not been addressed.  This coordinator spoke with Dr. Wynetta Emery about this information and he will assess.   Recommend that patient go to outpatient education at [facility] once she is feeling more like herself.  States she has never had formal education regarding carb counting and insulin.  Will follow during this admission.  No questions/concerns at the end of our visit. Thank you  Raoul Pitch MSN, RN,CDE Inpatient Diabetes Coordinator [phone] (team pager)

## 2016-06-07 NOTE — Progress Notes (Signed)
Hypoglycemic Event  CBG: 63  Treatment: D50 IV 25 mL  Symptoms: None  Follow-up CBG: Time:1009 CBG Result: 196  Possible Reasons for Event: Inadequate meal intake-Pt has been NPO since midnight for surgery today      Raquel James

## 2016-06-08 ENCOUNTER — Encounter (HOSPITAL_COMMUNITY): Payer: Self-pay | Admitting: Orthopedic Surgery

## 2016-06-08 DIAGNOSIS — S72001D Fracture of unspecified part of neck of right femur, subsequent encounter for closed fracture with routine healing: Secondary | ICD-10-CM

## 2016-06-08 LAB — CBC
HEMATOCRIT: 26.9 % — AB (ref 36.0–46.0)
HEMOGLOBIN: 8.8 g/dL — AB (ref 12.0–15.0)
MCH: 29.1 pg (ref 26.0–34.0)
MCHC: 32.7 g/dL (ref 30.0–36.0)
MCV: 89.1 fL (ref 78.0–100.0)
Platelets: 100 10*3/uL — ABNORMAL LOW (ref 150–400)
RBC: 3.02 MIL/uL — ABNORMAL LOW (ref 3.87–5.11)
RDW: 17.3 % — AB (ref 11.5–15.5)
WBC: 4.9 10*3/uL (ref 4.0–10.5)

## 2016-06-08 LAB — PTH, INTACT AND CALCIUM
Calcium, Total (PTH): 7.7 mg/dL — ABNORMAL LOW (ref 8.7–10.3)
PTH: 27 pg/mL (ref 15–65)

## 2016-06-08 LAB — BASIC METABOLIC PANEL
ANION GAP: 6 (ref 5–15)
BUN: 26 mg/dL — ABNORMAL HIGH (ref 6–20)
CALCIUM: 7.6 mg/dL — AB (ref 8.9–10.3)
CHLORIDE: 111 mmol/L (ref 101–111)
CO2: 19 mmol/L — AB (ref 22–32)
CREATININE: 1.63 mg/dL — AB (ref 0.44–1.00)
GFR calc Af Amer: 37 mL/min — ABNORMAL LOW (ref >60)
GFR, EST NON AFRICAN AMERICAN: 32 mL/min — AB (ref >60)
GLUCOSE: 199 mg/dL — AB (ref 65–99)
POTASSIUM: 3.6 mmol/L (ref 3.5–5.1)
Sodium: 136 mmol/L (ref 135–145)

## 2016-06-08 LAB — GLUCOSE, CAPILLARY
GLUCOSE-CAPILLARY: 103 mg/dL — AB (ref 65–99)
GLUCOSE-CAPILLARY: 117 mg/dL — AB (ref 65–99)
GLUCOSE-CAPILLARY: 119 mg/dL — AB (ref 65–99)
Glucose-Capillary: 213 mg/dL — ABNORMAL HIGH (ref 65–99)
Glucose-Capillary: 114 mg/dL — ABNORMAL HIGH (ref 65–99)
Glucose-Capillary: 191 mg/dL — ABNORMAL HIGH (ref 65–99)

## 2016-06-08 LAB — CALCITRIOL (1,25 DI-OH VIT D): VIT D 1 25 DIHYDROXY: 25.6 pg/mL (ref 19.9–79.3)

## 2016-06-08 LAB — VITAMIN D 25 HYDROXY (VIT D DEFICIENCY, FRACTURES): Vit D, 25-Hydroxy: 44.9 ng/mL (ref 30.0–100.0)

## 2016-06-08 MED ORDER — INSULIN ASPART 100 UNIT/ML ~~LOC~~ SOLN
3.0000 [IU] | Freq: Three times a day (TID) | SUBCUTANEOUS | Status: DC
  Administered 2016-06-09 – 2016-06-11 (×8): 3 [IU] via SUBCUTANEOUS

## 2016-06-08 MED ORDER — ACETAMINOPHEN 325 MG PO TABS: 650.0000 mg | ORAL_TABLET | Freq: Four times a day (QID) | ORAL | Status: AC | PRN

## 2016-06-08 MED ORDER — ENOXAPARIN SODIUM 30 MG/0.3ML ~~LOC~~ SOLN: 30.0000 mg | SUBCUTANEOUS | Status: DC

## 2016-06-08 MED ORDER — ENSURE ENLIVE PO LIQD
237.0000 mL | Freq: Two times a day (BID) | ORAL | Status: DC
  Administered 2016-06-08 – 2016-06-10 (×3): 237 mL via ORAL

## 2016-06-08 MED ORDER — INSULIN ASPART 100 UNIT/ML ~~LOC~~ SOLN: 4.0000 [IU] | Freq: Three times a day (TID) | SUBCUTANEOUS | Status: DC

## 2016-06-08 NOTE — Progress Notes (Signed)
Initial Nutrition Assessment  DOCUMENTATION CODES:   Not applicable  INTERVENTION:  Provide Ensure Enlive po BID, each supplement provides 350 kcal and 20 grams of protein.  Encourage adequate PO intake.   NUTRITION DIAGNOSIS:   Increased nutrient needs related to  (s/p surgery) as evidenced by estimated needs.  GOAL:   Patient will meet greater than or equal to 90% of their needs  MONITOR:   PO intake, Supplement acceptance, Weight trends, Labs, I & O's  REASON FOR ASSESSMENT:   Consult Assessment of nutrition requirement/status  ASSESSMENT:   With past medical history significant for diabetes, chronic anemia, coronary artery disease status post recent stent placement and chronic kidney disease presents to emergency room after fall.  Patient was brought to the emergency room due to this fall by EMS and was found to have a femoral neck fracture on x-ray.   Procedure (6/8): ARTHROPLASTY BIPOLAR HIP (HEMIARTHROPLASTY) (Right)  Pt reports she has been having difficulties swallowing and chewing at meals. Noted diet has been downgraded to dysphagia 3 diet. Pt reports eating well PTA with usual intake of 3 meals a day. Pt reports meal completion has been </= 50% since admission. Per Epic weight records, weight has been stable. RD to order Ensure to aid in caloric and protein needs. Pt encouraged to eat her food at meals.   Nutrition-Focused physical exam completed. Findings are no fat depletion, moderate muscle depletion, and mild edema.   Labs and medications reviewed.    Diet Order:  DIET DYS 3 Room service appropriate?: Yes; Fluid consistency:: Thin  Skin:   (Incision on R hip)  Last BM:  6/6  Height:   Ht Readings from Last 1 Encounters:  06/05/16 5\' 3"  (1.6 m)    Weight:   Wt Readings from Last 1 Encounters:  06/05/16 115 lb (52.164 kg)    Ideal Body Weight:  52.27 kg  BMI:  Body mass index is 20.38 kg/(m^2).  Estimated Nutritional Needs:   Kcal:   1500-1800  Protein:  65-75 grams  Fluid:  >/= 1.5 L/day  EDUCATION NEEDS:   No education needs identified at this time  Corrin Parker, MS, RD, LDN Pager # [phone] After hours/ weekend pager # [phone]

## 2016-06-08 NOTE — Progress Notes (Addendum)
Inpatient Diabetes Program Recommendations  AACE/ADA: New Consensus Statement on Inpatient Glycemic Control (2015)  Target Ranges:  Prepandial:   less than 140 mg/dL      Peak postprandial:   less than 180 mg/dL (1-2 hours)      Critically ill patients:  140 - 180 mg/dL   Lab Results  Component Value Date   GLUCAP 213* 06/08/2016   HGBA1C 7.6* 06/05/2016    Review of Glycemic Control  Inpatient Diabetes Program Recommendations:   Insulin - Meal Coverage: add Novolog 4 units TID with meals  Outpatient Referral: needs one-on-one for carb counting and insulin coverage  Consider dc Lantus and change Novolog correction to TID since eating now. Thank you  Raoul Pitch MSN, RN,CDE Inpatient Diabetes Coordinator [phone] (team pager)

## 2016-06-08 NOTE — Progress Notes (Signed)
PROGRESS NOTE  06/08/2016 12:38 PM   [name]  [id number]  DOB: 10/18/1951  DOA: 06/05/2016 PCP: [name], MD Outpatient Specialists:   Hospital course: With past medical history significant for diabetes, chronic anemia, coronary artery disease status post recent stent placement and chronic kidney disease presents to emergency room after fall. Patient states that she has fallen once every few days over the last 2 weeks. At baseline she uses a walker. She does not always use it as she is supposed to. Patient denies any prodrome prior to her falls. She states when this has occured her legs just get weak and shaky and then she loses her balance and topples over. Both times when she has fallen she has hit her head on the tile floor in her house. She denies any loss of consciousness. Patient does live alone. Patient was brought to the emergency room due to this fall by EMS and was found to have a femoral neck fracture on x-ray. Orthopedics consult in the emergency room and surgery is planned for 6/8. She was found to be anemic and 2 units of bloodgiven prior to surgery.  Assessment & Plan: Acute Right femoral neck fracture Orthopedics consulted: Plan to OR on 06/07/16. When necessary morphine 4 mg for severe pain Morphine 2 mg when necessary for moderate pain Nothing by mouth at midnight Surgery performed on 6/8.    Prolonged QT - resolved magnesium and phosphorus WNL. EKG repeated this morning within normal limits.  prolonged QTcResolved. Patient did have low potassium when she first arrived but has corrected. With correction of anemia and electrolytes EKG could normalize. If normal surgery can occur. If not consider cardiology consult. Revised cardiac risk index for preoperative risk score of 4, 11% risk of major cardiac event  Fall with head trauma & low H/H CT head and neck no acute findings.  Patient with baseline mentation per family Likely due to deconditioning  and acute worsening of her anemia currently being transfused 2 units started in the emergency room Hgb above 10 needed before surgery Currently holding aspirin and Plavix.  UTI Plan to treat with levofloxacin per pharmacy dosing as patient reports allergies to penicillins  Hypertension lisinopril 2.5 mg by mouth daily at bedtime Coreg 3.125 mg twice a day Imdur 60 mg daily  CKD Kidney function baseline Avoid nephrotoxic medication  CAD Nitroglycerin when necessary Holding aspirin and Plavix  Hyperlipidemia Zocor 20 mg daily at bedtime  Fibromyalgia and all over body tremor Gabapentin 300 mg 3 times a day Topamax 100 mg twice a day  Depressive disorder/anxiety Prozac 20 mg daily Xanax when necessary 3 times a day  Diabetes - type 2, insulin requiring Patient is a known brittle diabetic with history of severe hypoglycemia Will stop basal insulin as she has been having lows. Given small dose of insulin with meals. Strict hypoglycemia precautions recommended.   Decreased hearing Likely due to cerumen Would start Debrox after surgery  Reflux Protonix 40 mg by mouth daily  Code Status: full code DVT Prophylaxis: SCD Family Communication: yes Disposition Plan: PT consult ordered for tomorrow  Consultants:  [facility]  Procedures:  pending  Antimicrobials:  levofloxacin   Subjective: Pt sitting up in chair.   No complaints.   Objective: Filed Vitals:   06/07/16 2230 06/07/16 2249 06/08/16 0426 06/08/16 0612  BP: 108/94 119/74 142/62   Pulse: 77 85 82   Temp: 97.3 F (36.3 C) 97.6 F (36.4 C) 99.5 F (37.5 C) 99.4  F (37.4 C)  TempSrc:  Oral Oral Oral  Resp: 14 16 16    Height:      Weight:      SpO2: 100% 96% 100%     Intake/Output Summary (Last 24 hours) at 06/08/16 1238 Last data filed at 06/08/16 0500  Gross per 24 hour  Intake   2200 ml  Output    550 ml  Net   1650 ml   Filed Weights   06/05/16 1737  Weight: 115 lb  (52.164 kg)   Exam:  General exam: Pt is thin, emaciated appearing, awake, alert, no distress, cooperative.  Respiratory system: Clear. No increased work of breathing. Cardiovascular system: S1 & S2 heard, RRR. No JVD, murmurs, gallops, clicks or pedal edema. Gastrointestinal system: Abdomen is nondistended, soft and nontender. Normal bowel sounds heard. Central nervous system: Alert and oriented. No focal neurological deficits. Extremities: bandages clean and dry.  Data Reviewed: Basic Metabolic Panel:  Recent Labs Lab 06/05/16 1740 06/05/16 1802 06/06/16 0652 06/07/16 0350 06/08/16 0402  NA 141  --  141 135 136  K 4.4  --  4.4 3.6 3.6  CL 111  --  110 110 111  CO2 23  --  25 21* 19*  GLUCOSE 130*  --  131* 80 199*  BUN 50*  --  43* 28* 26*  CREATININE 2.08*  --  1.82* 1.51* 1.63*  CALCIUM 8.9  --  8.8* 8.0*  7.7* 7.6*  MG  --  1.9  --   --   --   PHOS  --  3.8  --   --   --    Liver Function Tests:  Recent Labs Lab 06/05/16 1740 06/07/16 0350  AST 28 23  ALT 24 19  ALKPHOS 45 30*  BILITOT 0.6 1.1  PROT 5.4* 4.6*  ALBUMIN 3.0* 2.4*   No results for input(s): LIPASE, AMYLASE in the last 168 hours. No results for input(s): AMMONIA in the last 168 hours. CBC:  Recent Labs Lab 06/05/16 1740 06/06/16 0652 06/07/16 0350 06/08/16 0402  WBC 3.7* 6.0 5.8 4.9  NEUTROABS 2.5  --  4.7  --   HGB 7.3* 10.4* 10.1* 8.8*  HCT 22.6* 31.9* 30.7* 26.9*  MCV 96.2 88.9 88.5 89.1  PLT 170 108* 113* 100*   Cardiac Enzymes: No results for input(s): CKTOTAL, CKMB, CKMBINDEX, TROPONINI in the last 168 hours. BNP (last 3 results) No results for input(s): PROBNP in the last 8760 hours. CBG:  Recent Labs Lab 06/07/16 2148 06/07/16 2246 06/08/16 0033 06/08/16 0415 06/08/16 1159  GLUCAP 105* 103* 119* 213* 117*    Recent Results (from the past 240 hour(s))  Surgical pcr screen     Status: None   Collection Time: 06/06/16  2:00 AM  Result Value Ref Range Status    MRSA, PCR NEGATIVE NEGATIVE Final   Staphylococcus aureus NEGATIVE NEGATIVE Final    Comment:        The Xpert SA Assay (FDA approved for NASAL specimens in patients over 89 years of age), is one component of a comprehensive surveillance program.  Test performance has been validated by [facility] for patients greater than or equal to 10 year old. It is not intended to diagnose infection nor to guide or monitor treatment.      Studies: Dg Hip Port Unilat With Pelvis 1v Right  06/07/2016  CLINICAL DATA:  Right femoral neck fracture.  Postop imaging. EXAM: DG HIP (WITH OR WITHOUT PELVIS) 1V PORT RIGHT COMPARISON:  Two days ago FINDINGS: New bipolar right hip hemiarthroplasty without periprosthetic fracture. The prosthesis is located. Negative visualized bony pelvis and left hip. Osteopenia and atherosclerosis. IMPRESSION: No adverse finding after right hip hemiarthroplasty. Electronically Signed   By: Monte Fantasia M.D.   On: 06/07/2016 22:24     Scheduled Meds: . carvedilol  3.125 mg Oral BID WC  . enoxaparin (LOVENOX) injection  30 mg Subcutaneous Q24H  . FLUoxetine  20 mg Oral Daily  . gabapentin  600 mg Oral TID  . insulin aspart  0-9 Units Subcutaneous Q4H  . [START ON 06/09/2016] insulin aspart  4 Units Subcutaneous TID WC  . isosorbide mononitrate  60 mg Oral Daily  . levofloxacin  500 mg Oral Q48H  . lisinopril  2.5 mg Oral QHS  . pantoprazole  40 mg Oral Daily  . simvastatin  20 mg Oral QHS  . topiramate  100 mg Oral BID   Continuous Infusions: . lactated ringers 10 mL/hr at 06/07/16 1713    Principal Problem:   Fracture of femoral neck, right (HCC) Active Problems:   Symptomatic anemia   CKD (chronic kidney disease) stage 4, GFR 15-29 ml/min (HCC)   DM w/o complication type II (HCC)   HLD (hyperlipidemia)   Depressive disorder   HTN (hypertension)   CAD (coronary artery disease)   Fibromyalgia   GERD (gastroesophageal reflux disease)   Head trauma    Decreased hearing of both ears   Hip fracture ([place])   Femoral neck fracture ([place])  Time spent:   Irwin Brakeman, MD, FAAFP [facility] Pager [phone]  If 7PM-7AM, please contact night-coverage [email or web address] Password [email or web address] 06/08/2016, 12:38 PM    LOS: 3 days

## 2016-06-08 NOTE — Evaluation (Addendum)
Clinical/Bedside Swallow Evaluation Patient Details  Name: [name] MRN: [id number] Date of Birth: 1951-07-19  Today's Date: 06/08/2016 Time: SLP Start Time (ACUTE ONLY): 43 SLP Stop Time (ACUTE ONLY): 1300 SLP Time Calculation (min) (ACUTE ONLY): 30 min  Past Medical History:  Past Medical History  Diagnosis Date  . DIABETES MELLITUS, II, COMPLICATIONS 99991111  . HYPERLIPIDEMIA 02/27/2007  . 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 ANEMIA, OTHER, UNSPECIFIED 02/27/2007  . DEPRESSIVE DISORDER, NOS 02/27/2007  . HYPERTENSION, BENIGN SYSTEMIC 02/27/2007  . CORONARY, ARTERIOSCLEROSIS 02/27/2007  . TIA 05/17/2009  . GASTROESOPHAGEAL REFLUX, NO ESOPHAGITIS 02/27/2007  . RENAL INSUFFICIENCY, CHRONIC 12/28/2009  . Renal failure, acute on chronic (HCC) 01/25/2014  . Blood transfusion without reported diagnosis   . Anemia    Past Surgical History:  Past Surgical History  Procedure Laterality Date  . Partial hysterectomy    . Rectocele repair    . Bladder surgery      Tack  . Hip arthroplasty Right 06/07/2016    Procedure: ARTHROPLASTY BIPOLAR HIP (HEMIARTHROPLASTY);  Surgeon: Altamese , MD;  Location: [facility];  Service: Orthopedics;  Laterality: Right;   HPI:  65 year old female admitted 06/05/16 after a fall at home.   Assessment / Plan / Recommendation Clinical Impression  Pt presents with adequate oral motor strength and function. She was observed during lunch of salad, grilled chicken and tea. Pt commented several times about how it was difficult for her to chew, given limited dentition. She also reports globus sensation with solids, which raises suspicion for esophageal issues. Pt does have a history of GERD. No overt s/s aspiration observed on any consistency, however, pt is at risk for aspiration due to current weakness, and with increased fatigue. Will modify diet to dys 3 with chopped meats, thin liquids and follow for assessment of diet tolerance.  Recommend consideration of   Regular barium swallow to evaluate esophageal motility.   Aspiration Risk  Mild aspiration risk    Diet Recommendation Thin liquid;Dysphagia 3 (Mech soft) (chop meats for energy conservation)   Liquid Administration via: Cup;Straw Medication Administration: Whole meds with liquid Supervision: Patient able to self feed Compensations: Minimize environmental distractions;Slow rate;Small sips/bites Postural Changes: Seated upright at 90 degrees;Remain upright for at least 30 minutes after po intake    Other  Recommendations Recommended Consults: Consider esophageal assessment Oral Care Recommendations: Oral care BID   Follow up Recommendations  Will follow for assessment of diet tolerance and readiness to advance.    Frequency and Duration min 1 x/week  2 weeks       Prognosis Prognosis for Safe Diet Advancement: Good      Swallow Study   General Date of Onset: 06/05/16 HPI: 65 year old female admitted 06/05/16 after a fall at home. Type of Study: Bedside Swallow Evaluation Previous Swallow Assessment: MBS completed 09/06/2009 - report unavailable Diet Prior to this Study: Regular;Thin liquids Temperature Spikes Noted: No Respiratory Status: Room air Behavior/Cognition: Alert;Cooperative;Pleasant mood (weak) Oral Cavity Assessment: Within Functional Limits Oral Care Completed by SLP: No (pt eating lunch) Oral Cavity - Dentition: Missing dentition Vision: Functional for self-feeding Self-Feeding Abilities: Able to feed self Patient Positioning: Upright in chair Baseline Vocal Quality: Normal Volitional Cough: Weak Volitional Swallow: Able to elicit    Oral/Motor/Sensory Function Overall Oral Motor/Sensory Function: Within functional limits   Ice Chips Ice chips: Not tested   Thin Liquid Thin Liquid: Within functional limits Presentation: Straw    Nectar Thick Nectar Thick Liquid: Not tested  Honey Thick Honey Thick Liquid: Not tested   Puree Puree: Not tested    Solid    Solid: Within functional limits Presentation: Self Fed Other Comments: Pt reports globus sensation following solids       Arleta Ostrum, Fredirick Maudlin 06/08/2016,1:13 PM  Kerem Gilmer B. Quentin Ore [facility], [place] [phone]

## 2016-06-08 NOTE — Care Management Important Message (Signed)
Important Message  Patient Details  Name: [name] MRN: [id number] Date of Birth: 02/05/1951   Medicare Important Message Given:  Yes    Stefanos Haynesworth Abena 06/08/2016, 11:13 AM

## 2016-06-08 NOTE — Evaluation (Signed)
Physical Therapy Evaluation Patient Details Name: [name] MRN: [id number] DOB: 07-Apr-1951 Today's Date: 06/08/2016   History of Present Illness  pt presents after multiple falls at home and sustained a R Femoral Neck fx now s/p Hemiarthroplasty.  pt with hx of falls, DM, Anemia, CAD, and CKD.    Clinical Impression  Pt anxious with mobility and needs very slow step-by-step cueing for tasks.  Pt with confusion and tremors throughout session and family indicates this has just been occurring since surgery.  Per conversation with daughter-in-law, pt has been declining at home for some time and has been having multiple falls.  Pt will need SNF level of care for longer term rehab to maximize independence.  Will continue to follow.      Follow Up Recommendations SNF    Equipment Recommendations  None recommended by PT    Recommendations for Other Services       Precautions / Restrictions Precautions Precautions: Fall;Posterior Hip Precaution Booklet Issued: Yes (comment) Precaution Comments: Will need continued education. Restrictions Weight Bearing Restrictions: Yes RLE Weight Bearing: Weight bearing as tolerated      Mobility  Bed Mobility Overal bed mobility: Needs Assistance;+2 for physical assistance Bed Mobility: Supine to Sit     Supine to sit: Max assist;+2 for physical assistance;HOB elevated     General bed mobility comments: Step-by-step cueing for technique and encouragement.  Utilized pad under hips to come towards EOB.    Transfers Overall transfer level: Needs assistance Equipment used: Rolling walker (2 wheeled);2 person hand held assist Transfers: Sit to/from [facility] Sit to Stand: Mod assist;+2 physical assistance Stand pivot transfers: Max assist;+2 physical assistance       General transfer comment: Initially attempted to stand with 2 person A and RW, however pt pushes posteriorly on RW and needs Max facilitation for bringing  body over BOS.  2nd attempt at standing used only 2 person A with pad under hips to promote more upright posture and A with pivoting to recliner.  pt did not attempt to move LEs through pivot.    Ambulation/Gait                Stairs            Wheelchair Mobility    Modified Rankin (Stroke Patients Only)       Balance Overall balance assessment: Needs assistance;History of Falls Sitting-balance support: Bilateral upper extremity supported;Feet supported Sitting balance-Leahy Scale: Poor   Postural control: Posterior lean;Left lateral lean Standing balance support: During functional activity Standing balance-Leahy Scale: Zero                               Pertinent Vitals/Pain Pain Assessment: Faces Faces Pain Scale: Hurts little more Pain Location: pt indicates "not bad" when asked, but clearly grimacing during mobility.   Pain Descriptors / Indicators: Grimacing Pain Intervention(s): Monitored during session;Premedicated before session;Repositioned    Home Living Family/patient expects to be discharged to:: Skilled nursing facility                      Prior Function Level of Independence: Independent         Comments: pt lived alone, however daughter-in-law questions how well pt was truly doing on her own as she has been declining for some time.       Hand Dominance        Extremity/Trunk Assessment  Upper Extremity Assessment: Defer to OT evaluation           Lower Extremity Assessment: Generalized weakness;RLE deficits/detail RLE Deficits / Details: Strength limited by pain grossly 3/5.  Sensation intact.    Cervical / Trunk Assessment: Kyphotic  Communication   Communication: HOH  Cognition Arousal/Alertness: Awake/alert Behavior During Therapy: Anxious Overall Cognitive Status: Impaired/Different from baseline Area of Impairment: Orientation;Attention;Memory;Following commands;Safety/judgement;Awareness;Problem  solving Orientation Level: Disoriented to;Time Current Attention Level: Selective Memory: Decreased short-term memory;Decreased recall of precautions Following Commands: Follows one step commands with increased time;Follows multi-step commands inconsistently Safety/Judgement: Decreased awareness of safety;Decreased awareness of deficits Awareness: Intellectual Problem Solving: Slow processing;Difficulty sequencing;Requires verbal cues;Requires tactile cues General Comments: Family feels some of the deficits are due to meds.      General Comments      Exercises        Assessment/Plan    PT Assessment Patient needs continued PT services  PT Diagnosis Difficulty walking;Generalized weakness;Acute pain   PT Problem List Decreased strength;Decreased activity tolerance;Decreased balance;Decreased mobility;Decreased knowledge of use of DME;Decreased cognition;Decreased safety awareness;Decreased knowledge of precautions;Pain  PT Treatment Interventions DME instruction;Gait training;Functional mobility training;Therapeutic activities;Therapeutic exercise;Balance training;Patient/family education;Cognitive remediation   PT Goals (Current goals can be found in the Care Plan section) Acute Rehab PT Goals Patient Stated Goal: Per family to regain her independence. PT Goal Formulation: With patient/family Time For Goal Achievement: 06/22/16 Potential to Achieve Goals: Good    Frequency Min 3X/week   Barriers to discharge        Co-evaluation               End of Session Equipment Utilized During Treatment: Gait belt Activity Tolerance: Patient limited by fatigue;Patient limited by pain Patient left: in chair;with call bell/phone within reach;with family/visitor present;with nursing/sitter in room Nurse Communication: Mobility status         Time: 1030-1104 PT Time Calculation (min) (ACUTE ONLY): 34 min   Charges:   PT Evaluation $PT Eval Moderate Complexity: 1  Procedure PT Treatments $Therapeutic Activity: 8-22 mins   PT G CodesCatarina Hartshorn, [place] [phone] 06/08/2016, 11:58 AM

## 2016-06-08 NOTE — Progress Notes (Signed)
Orthopaedic Trauma Service Progress Note  Subjective Patient doing well. Moderate hip pain. Foley removed this AM, patient has not voided since removal. Patient ate breakfast this morning with no issues.  ROS Cardiovascular: Patient denies chest pain. States she had chest tightness and SOB last night after ET tube removal. Resolved this morning.  Abdominal: Denies N/V  Objective  BP 142/62 mmHg  Pulse 82  Temp(Src) 99.4 F (37.4 C) (Oral)  Resp 16  Ht 5\' 3"  (1.6 m)  Wt 52.164 kg (115 lb)  BMI 20.38 kg/m2  SpO2 100%  Intake/Output      06/08 0701 - 06/09 0700 06/09 0701 - 06/10 0700   P.O. 0    I.V. (mL/kg) 2858 (54.8)    Total Intake(mL/kg) 2858 (54.8)    Urine (mL/kg/hr) 400 (0.3)    Blood 150 (0.1)    Total Output 550     Net +2308          Urine Occurrence 2 x      Labs Results for DEMETRIUS, THEILEN (MRN [id number]) as of 06/08/2016 08:59  Ref. Range 06/07/2016 03:50 06/08/2016 04:02  WBC Latest Ref Range: 4.0-10.5 K/uL 5.8 4.9  RBC Latest Ref Range: 3.87-5.11 MIL/uL 3.47 (L) 3.02 (L)  Hemoglobin Latest Ref Range: 12.0-15.0 g/dL 10.1 (L) 8.8 (L)  HCT Latest Ref Range: 36.0-46.0 % 30.7 (L) 26.9 (L)  MCV Latest Ref Range: 78.0-100.0 fL 88.5 89.1  MCH Latest Ref Range: 26.0-34.0 pg 29.1 29.1  MCHC Latest Ref Range: 30.0-36.0 g/dL 32.9 32.7  RDW Latest Ref Range: 11.5-15.5 % 17.9 (H) 17.3 (H)  Platelets Latest Ref Range: 150-400 K/uL 113 (L) 100 (L)    Exam  Gen: patient sitting in bed. NAD Lungs: CTAB Cardiac: RRR; No M/R/G Abd: +BS  Pelvis: no ecchymosis or obvious deformity Ext:   Right Lower Extremity   Patient still using abduction pillow.  PMS intact  No edema   Imaging Post-op Hip xray New bipolar right hip hemiarthroplasty without periprosthetic fracture. The prosthesis is located. Negative visualized bony pelvis and left hip. Osteopenia and atherosclerosis.  No adverse finding after right hip hemiarthroplasty.  Assessment and Plan    POD/HD#: 1   - Femoral neck fracture and hemi-arthroplasty  - Pain management:  Minimize narcotic use for pain management. Use Tylenol as primary pain medication.   - ABL anemia/Hemodynamics  CBC tomorrow morning   - Medical issues   UTI - continue Levaquin every 48 hours   - DVT/PE prophylaxis:  Lovenox 14 days  May resume Plavix in 3 days or when H/H and platelets return to baseline  -ID:  UTI - Klebsiella, continue levaquin  - Activity:  Weight bearing as tolerated   PT/OT eval today  - FEN/GI prophylaxis/Foley/Lines:  Foley removed this morning.   - Dispo:  Continue Ancef for 24 hours    Rosalita Levan, PA-S [facility] [phone] (O) 06/08/2016 8:55 AM    Pt seen and evaluated with PA-S Agree with findings above  Exam Gen: frail appearing female but NAD RLEx: dressing intact, + drainage  DPN, SPN, TN sensation intact   EHL, FHL, ankle flexion and extension intact  Ext warm  + DP pulse  Swelling stable  A/P   65 y/o female s/p R hip hemi-arthroplasty, unipolar  WBAT with assistance Posterior hip precautions R hip PT/OT evals Suspect pt will need SNF Given medical comorbidities pt at increased risk for periop complications Would transfuse if H/H drops any further CBC ordered  for tomorrow Pt on levaquin for UTI Will complete prophylactic ancef today  Lovenox at dc x 21 days, ok to resume plavix once cbc stabilizes (likely 3-5 days) Follow up with ortho in 2 weeks Dressing changes starting tomorrow  Jari Pigg, PA-C [facility] [phone] (P) 06/08/2016 9:36 AM

## 2016-06-08 NOTE — Anesthesia Postprocedure Evaluation (Signed)
Anesthesia Post Note  Patient: [name]  Procedure(s) Performed: Procedure(s) (LRB): ARTHROPLASTY BIPOLAR HIP (HEMIARTHROPLASTY) (Right)  Patient location during evaluation: PACU Anesthesia Type: General Level of consciousness: awake and alert Pain management: pain level controlled Vital Signs Assessment: post-procedure vital signs reviewed and stable Respiratory status: spontaneous breathing, nonlabored ventilation, respiratory function stable and patient connected to nasal cannula oxygen Cardiovascular status: blood pressure returned to baseline and stable Postop Assessment: no signs of nausea or vomiting Anesthetic complications: no    Last Vitals:  Filed Vitals:   06/07/16 2230 06/07/16 2249  BP: 108/94 119/74  Pulse: 77 85  Temp: 36.3 C 36.4 C  Resp: 14 16    Last Pain:  Filed Vitals:   06/07/16 2257  PainSc: [place] Chritopher Coster

## 2016-06-08 NOTE — Op Note (Signed)
NAMEJORDYNE, [name] NO.:  [id number]  MEDICAL RECORD NO.:  [id number]  LOCATION:  5N20C                        FACILITY:  [facility]  PHYSICIAN:  Astrid Divine. Marcelino Scot, M.D. DATE OF BIRTH:  December 04, 1951  DATE OF PROCEDURE:  06/07/2016 DATE OF DISCHARGE:                              OPERATIVE REPORT   PREOPERATIVE DIAGNOSIS:  Right displaced femoral neck fracture.  POSTOPERATIVE DIAGNOSIS:  Right displaced femoral neck fracture.  PROCEDURE:  Right hip hemiarthroplasty using DePuy with HA Duofix #5 stem, standard neck with high offset, and a 46 mm unipolar head.  SURGEON:  Astrid Divine. Marcelino Scot, M.D.  ASSISTANTS: 1. Jari Pigg, PA-C. 2. PA student.  ANESTHESIA:  General.  COMPLICATIONS:  None.  ESTIMATED BLOOD LOSS:  150 mL.  DISPOSITION:  To PACU.  CONDITION:  Stable.  BRIEF SUMMARY OF INDICATIONS FOR PROCEDURE:  [name] is a very pleasant 65 year old female of low activity, multiple comorbidities, who sustained a displaced femoral neck fracture.  She underwent medical evaluation including treatment with Levaquin for UTI and transfusions to correct her acute blood loss anemia.  She was moderate to high risk, and this was discussed in detail with the patient and her son and daughter- in-law.  They understood the complications to include, death, stroke heart attack, nerve injury, vessel injury, limb length inequality, DVT, PE, loss of motion, need for further surgery including conversion to total hip arthroplasty, instability, and multiple others.  After full discussion, she did wish to proceed.  BRIEF SUMMARY OF PROCEDURE:  The patient taken to the operating room, where general anesthesia was induced.  She did receive Ancef in addition for preoperative prophylaxis which she tolerated without incidence, demonstrating her cephalosporin tolerance.  A standard Kocher-Langenbeck approach was made in the right hip after sterile prep and drape and  time- out.  The tensor was split in line with the skin incision keeping the hip flexed to about 70 degrees.  The hip was internally rotated to expose the trochanteric bursa, which was excised in the piriformis fossa.  The piriformis tendon was divided near its insertion and secured with a #2 FiberWire, and then the capsule T'd grasping the 2 limbs with #1 Vicryl.  The neck cut was refined using cutting guide and then the femoral head withdrawn and measured 46 mm, and this was trialed in the acetabulum with perfect fit.  Once more carefully evaluated the neck and has tamped for any free fragments.  I did remove the ligamentum.  The femur was prepared with box cutter and Canal Finder, sequential reaming to 5, sequential broaching to 5, placement of the trial components with excellent fit and fill.  Excellent stability using the high offset standard neck in flexion, adduction, and internal rotation as well as extension and external rotation.  Actual components were placed again using the Duofix noted above.  The hip was once more trial finding outstanding stability.  Capsule was repaired using #1 Vicryl figure-of- eight and then using #1 Vicryl for the tensor with a 2-0 Vicryl and 3-0 nylon for the subcu and skin.  Sterile gently compressive dressing was applied.  The piriformis tendon was repaired back directly through bone tunnels using #2 FiberWire and  Kessler stitch with again outstanding soft-tissue tension following this repair.  She was placed into abduction pillow and taken to the PACU in stable condition.  Ainsley Spinner, PA-C, was helping throughout as was our second assist, and assistant was necessary to control the leg for preparation of the femur, to locate and dislocate the trial components as well as to place the actual components, and he did assist with closure as well.  PROGNOSIS:  The patient will be weightbearing as tolerated with posterior hip precautions.  She will be on  pharmacologic DVT prophylaxis with Lovenox and remains on the medical service.  She is certainly at elevated risk for complications in the postop period given her significant number and depth of comorbidities in addition to her generalized decreased level of activity and nutrition which consists largely of TV dinners.     Astrid Divine. Marcelino Scot, M.D.     MHH/MEDQ  D:  06/07/2016  T:  06/08/2016  Job:  [id number]

## 2016-06-08 NOTE — Progress Notes (Signed)
[facility]   SLP eval completed  Recommending regular barium swallow eval, this has been ordered  Pt currently on dysphagia 3 diet   Additionally pt is tolerant to cephalosporins as test dose given in OR, remaining abx given in OR for surgical prophylaxis.   Given medical comorbidities pt at high risk for complications including elevated mortality rate. Pt severely deconditioned following MI 3 months ago and not sure how well she will be able to recover following her hip fracture.  Would strongly consider Palliative care consult as well.   Please call with questions   Jari Pigg, PA-C [facility] [phone] (P) 06/08/2016 2:52 PM

## 2016-06-08 NOTE — Progress Notes (Signed)
Occupational Therapy Evaluation Patient Details Name: [name] MRN: [id number] DOB: [birth year] Today's Date: 06/08/2016    History of Present Illness pt presents after multiple falls at home and sustained a R Femoral Neck fx now s/p Hemiarthroplasty.  pt with hx of falls, DM, Anemia, CAD, and CKD.     Clinical Impression   Patient presents to OT with decreased ADL independence and safety due to the deficits listed below. Will benefit from skilled OT to maximize function and to facilitate a safe discharge. OT will follow.    Follow Up Recommendations  SNF    Equipment Recommendations  Other (comment) (tbd next venue of care)    Recommendations for Other Services       Precautions / Restrictions Precautions Precautions: Fall;Posterior Hip Precaution Booklet Issued: Yes (comment) Precaution Comments: Will need continued education. Restrictions Weight Bearing Restrictions: Yes RLE Weight Bearing: Weight bearing as tolerated      Mobility Bed Mobility            General bed mobility comments: NT -- pt received on BSC  Transfers Overall transfer level: Needs assistance Equipment used: Rolling walker (2 wheeled);2 person hand held assist Transfers: Sit to/from [facility] Sit to Stand: Mod assist;+2 physical assistance Stand pivot transfers: Max assist;+2 physical assistance       General transfer comment: cues throughout    Balance                                      ADL Overall ADL's : Needs assistance/impaired Eating/Feeding: Minimal assistance;Sitting Eating/Feeding Details (indicate cue type and reason): needed assistance with cutting food and opening packages on tray Grooming: Wash/dry hands;Set up;Sitting                   Toilet Transfer: Moderate assistance;+2 for physical assistance;+2 for safety/equipment;Adhering to hip precautions;Cueing for safety;Cueing for sequencing;Stand-pivot;BSC;RW    Toileting- Clothing Manipulation and Hygiene: Total assistance;+2 for physical assistance;+2 for safety/equipment;Sit to/from stand       Functional mobility during ADLs: Moderate assistance;Maximal assistance;+2 for physical assistance;+2 for safety/equipment;Rolling walker General ADL Comments: Patient received on BSC. Nurse tech and I assisted pt back to recliner and cleaned her up after toileting. Reviewed posterior hip precautions and began education on AE available for LB self-care. Patient needs continued education on hip precautions. Patient set up for lunch. Had difficulty cutting food and required assistance with that and opening packages and setting herself up. OT will follow.     Vision     Perception     Praxis      Pertinent Vitals/Pain Pain Assessment: Faces Faces Pain Scale: Hurts little more Pain Location: hip Pain Descriptors / Indicators: Grimacing;Sore Pain Intervention(s): Limited activity within patient's tolerance;Monitored during session;Repositioned     Hand Dominance Right   Extremity/Trunk Assessment Upper Extremity Assessment Upper Extremity Assessment: Generalized weakness   Lower Extremity Assessment Lower Extremity Assessment: Defer to PT evaluation    Cervical / Trunk Assessment Cervical / Trunk Assessment: Kyphotic   Communication Communication Communication: HOH   Cognition Arousal/Alertness: Awake/alert Behavior During Therapy: Anxious Overall Cognitive Status: Impaired/Different from baseline Area of Impairment: Orientation;Attention;Memory;Following commands;Safety/judgement;Awareness;Problem solving Orientation Level: Disoriented to;Time Current Attention Level: Selective Memory: Decreased short-term memory;Decreased recall of precautions Following Commands: Follows one step commands with increased time;Follows multi-step commands inconsistently Safety/Judgement: Decreased awareness of safety;Decreased awareness of  deficits Awareness: Intellectual Problem Solving: Slow processing;Difficulty  sequencing;Requires verbal cues;Requires tactile cues General Comments: Family feels some of the deficits are due to meds.     General Comments       Exercises       Shoulder Instructions      Home Living Family/patient expects to be discharged to:: Skilled nursing facility                                        Prior Functioning/Environment Level of Independence: Independent        Comments: pt lived alone, however daughter-in-law questions how well pt was truly doing on her own as she has been declining for some time.      OT Diagnosis: Generalized weakness;Acute pain   OT Problem List: Decreased strength;Decreased activity tolerance;Impaired balance (sitting and/or standing);Decreased cognition;Decreased safety awareness;Decreased knowledge of use of DME or AE;Decreased knowledge of precautions;Pain   OT Treatment/Interventions: Self-care/ADL training;Therapeutic exercise;DME and/or AE instruction;Therapeutic activities;Patient/family education    OT Goals(Current goals can be found in the care plan section) Acute Rehab OT Goals Patient Stated Goal: Per family to regain her independence. OT Goal Formulation: With patient Time For Goal Achievement: 06/22/16 Potential to Achieve Goals: Good ADL Goals Pt Will Perform Lower Body Bathing: with mod assist;with adaptive equipment;sit to/from stand Pt Will Perform Lower Body Dressing: with mod assist;with adaptive equipment;sit to/from stand Pt Will Transfer to Toilet: with mod assist;bedside commode Pt Will Perform Toileting - Clothing Manipulation and hygiene: with mod assist;sit to/from stand Additional ADL Goal #1: Recall 3/3 posterior hip precautions without cues.  OT Frequency: Min 2X/week   Barriers to D/C:            Co-evaluation              End of Session Equipment Utilized During Treatment: Advertising account executive Communication: Mobility status  Activity Tolerance: Patient tolerated treatment well Patient left: in chair;with call bell/phone within reach;with chair alarm set;with family/visitor present   Time: 1207-1223 OT Time Calculation (min): 16 min Charges:  OT General Charges $OT Visit: 1 Procedure OT Evaluation $OT Eval Moderate Complexity: 1 Procedure G-Codes:    Willis Kuipers A 06-26-2016, 1:38 PM

## 2016-06-09 DIAGNOSIS — I25119 Atherosclerotic heart disease of native coronary artery with unspecified angina pectoris: Secondary | ICD-10-CM

## 2016-06-09 LAB — CBC WITH DIFFERENTIAL/PLATELET
Basophils Absolute: 0 10*3/uL (ref 0.0–0.1)
Basophils Relative: 0 %
EOS PCT: 0 %
Eosinophils Absolute: 0 10*3/uL (ref 0.0–0.7)
HEMATOCRIT: 23.9 % — AB (ref 36.0–46.0)
Hemoglobin: 7.9 g/dL — ABNORMAL LOW (ref 12.0–15.0)
LYMPHS ABS: 0.7 10*3/uL (ref 0.7–4.0)
LYMPHS PCT: 17 %
MCH: 29.2 pg (ref 26.0–34.0)
MCHC: 33.1 g/dL (ref 30.0–36.0)
MCV: 88.2 fL (ref 78.0–100.0)
MONO ABS: 0.5 10*3/uL (ref 0.1–1.0)
Monocytes Relative: 12 %
Neutro Abs: 3 10*3/uL (ref 1.7–7.7)
Neutrophils Relative %: 71 %
PLATELETS: 89 10*3/uL — AB (ref 150–400)
RBC: 2.71 MIL/uL — AB (ref 3.87–5.11)
RDW: 16.8 % — ABNORMAL HIGH (ref 11.5–15.5)
WBC: 4.2 10*3/uL (ref 4.0–10.5)

## 2016-06-09 LAB — TYPE AND SCREEN
ABO/RH(D): O POS
Antibody Screen: NEGATIVE
UNIT DIVISION: 0
Unit division: 0
Unit division: 0
Unit division: 0

## 2016-06-09 LAB — BASIC METABOLIC PANEL
Anion gap: 5 (ref 5–15)
BUN: 31 mg/dL — AB (ref 6–20)
CHLORIDE: 108 mmol/L (ref 101–111)
CO2: 22 mmol/L (ref 22–32)
Calcium: 7.8 mg/dL — ABNORMAL LOW (ref 8.9–10.3)
Creatinine, Ser: 2.02 mg/dL — ABNORMAL HIGH (ref 0.44–1.00)
GFR calc Af Amer: 28 mL/min — ABNORMAL LOW (ref >60)
GFR calc non Af Amer: 25 mL/min — ABNORMAL LOW (ref >60)
GLUCOSE: 218 mg/dL — AB (ref 65–99)
POTASSIUM: 3.7 mmol/L (ref 3.5–5.1)
SODIUM: 135 mmol/L (ref 135–145)

## 2016-06-09 LAB — PREPARE RBC (CROSSMATCH)

## 2016-06-09 LAB — GLUCOSE, CAPILLARY
GLUCOSE-CAPILLARY: 182 mg/dL — AB (ref 65–99)
GLUCOSE-CAPILLARY: 107 mg/dL — AB (ref 65–99)
GLUCOSE-CAPILLARY: 166 mg/dL — AB (ref 65–99)
GLUCOSE-CAPILLARY: 169 mg/dL — AB (ref 65–99)
Glucose-Capillary: 230 mg/dL — ABNORMAL HIGH (ref 65–99)
Glucose-Capillary: 231 mg/dL — ABNORMAL HIGH (ref 65–99)
Glucose-Capillary: 71 mg/dL (ref 65–99)

## 2016-06-09 MED ORDER — SODIUM CHLORIDE 0.9 % IV SOLN
Freq: Once | INTRAVENOUS | Status: AC
Start: 2016-06-09 — End: 2016-06-09
  Administered 2016-06-09: 18:00:00 via INTRAVENOUS

## 2016-06-09 MED ORDER — CLONIDINE HCL 0.1 MG PO TABS
0.1000 mg | ORAL_TABLET | Freq: Once | ORAL | Status: AC
  Administered 2016-06-09: 0.1 mg via ORAL
  Filled 2016-06-09: qty 1

## 2016-06-09 MED ORDER — BISACODYL 10 MG RE SUPP: 10.0000 mg | Freq: Every day | RECTAL | Status: DC | PRN

## 2016-06-09 MED ORDER — INSULIN ASPART 100 UNIT/ML ~~LOC~~ SOLN
0.0000 [IU] | Freq: Three times a day (TID) | SUBCUTANEOUS | Status: DC
  Administered 2016-06-09: 3 [IU] via SUBCUTANEOUS
  Administered 2016-06-10 – 2016-06-11 (×4): 2 [IU] via SUBCUTANEOUS
  Administered 2016-06-11: 3 [IU] via SUBCUTANEOUS

## 2016-06-09 MED ORDER — SENNOSIDES-DOCUSATE SODIUM 8.6-50 MG PO TABS: 1.0000 | ORAL_TABLET | Freq: Two times a day (BID) | ORAL | Status: DC | PRN

## 2016-06-09 NOTE — Clinical Social Work Placement (Signed)
   CLINICAL SOCIAL WORK PLACEMENT  NOTE  Date:  06/09/2016  Patient Details  Name: [name] MRN: [id number] Date of Birth: [birth year]  Clinical Social Work is seeking post-discharge placement for this patient at the [place] level of care (*CSW will initial, date and re-position this form in  chart as items are completed):  Yes   Patient/family provided with [facility] Work Department's list of facilities offering this level of care within the geographic area requested by the patient (or if unable, by the patient's family).  Yes   Patient/family informed of their freedom to choose among providers that offer the needed level of care, that participate in Medicare, Medicaid or managed care program needed by the patient, have an available bed and are willing to accept the patient.  Yes   Patient/family informed of 's ownership interest in [facility] and [facility], as well as of the fact that they are under no obligation to receive care at these facilities.  PASRR submitted to EDS on 06/09/16     PASRR number received on 06/09/16     Existing PASRR number confirmed on 06/09/16     FL2 transmitted to all facilities in geographic area requested by pt/family on 06/09/16     FL2 transmitted to all facilities within larger geographic area on       Patient informed that his/her managed care company has contracts with or will negotiate with certain facilities, including the following:            Patient/family informed of bed offers received.  Patient chooses bed at       Physician recommends and patient chooses bed at      Patient to be transferred to   on  .  Patient to be transferred to facility by       Patient family notified on   of transfer.  Name of family member notified:        PHYSICIAN Please prepare prescriptions, Please sign FL2     Additional Comment:     _______________________________________________ Lilly Cove, LCSW 06/09/2016, 9:47 AM

## 2016-06-09 NOTE — NC FL2 (Signed)
[place] LEVEL OF CARE SCREENING TOOL     IDENTIFICATION  Patient Name: [name] Birthdate: [birth year] Sex: female Admission Date (Current Location): 06/05/2016  [facility] and [place] Number:  Herbalist and Address:  [facility] [place] [place]. [facility], [place] [address] [place], [place] [address]      Provider Number: [id number]  Attending Physician Name and Address:  Murlean Iba, MD  Relative Name and Phone Number:       Current Level of Care: Hospital Recommended Level of Care: [place] Prior Approval Number:    Date Approved/Denied:   PASRR Number:   [id number]   Discharge Plan: SNF    Current Diagnoses: Patient Active Problem List   Diagnosis Date Noted  . CKD (chronic kidney disease) stage 4, GFR 15-29 ml/min (HCC) 06/05/2016  . Fracture of femoral neck, right ([place]) 06/05/2016  . DM w/o complication type II ([place]) 06/05/2016  . HLD (hyperlipidemia) 06/05/2016  . Depressive disorder 06/05/2016  . HTN (hypertension) 06/05/2016  . CAD (coronary artery disease) 06/05/2016  . Fibromyalgia 06/05/2016  . GERD (gastroesophageal reflux disease) 06/05/2016  . Head trauma 06/05/2016  . Decreased hearing of both ears 06/05/2016  . Hip fracture ([place]) 06/05/2016  . Femoral neck fracture ([place]) 06/05/2016  . Symptomatic anemia 03/09/2016  . Acute MI ([place]) 02/07/2016  . NSTEMI (non-ST elevated myocardial infarction) ([place]) 02/07/2016  . Chronic kidney disease (CKD), stage IV (severe) ([place]) 06/26/2015  . Chest pain with high risk for cardiac etiology 06/24/2015  . Low hemoglobin 02/02/2014  . Abdominal pain, epigastric 02/02/2014  . Black stools 02/02/2014  . Anemia 01/25/2014  . Renal failure, acute on chronic (HCC) 01/25/2014  . MGUS (monoclonal gammopathy of unknown significance) 11/02/2013  . OSTEOARTHROS UNSPEC GEN/LOC OTH SPEC SITES 07/05/2010  . HEAD TRAUMA 05/04/2010  . CELLULITIS AND ABSCESS OF UNSPECIFIED  SITE 05/02/2010  . INSOMNIA, CHRONIC 04/06/2010  . HYPERKALEMIA 03/02/2010  . LEG CRAMPS 02/28/2010  . GASTROENTERITIS WITHOUT DEHYDRATION 02/21/2010  . HAIR LOSS 01/31/2010  . PROCTITIS 12/29/2009  . Chronic kidney disease 12/28/2009  . PRURITUS 10/04/2009  . ULCER, RECTUM 09/28/2009  . UNSPECIFIED CONSTIPATION 09/17/2009  . 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 FATIGUE 08/05/2009  . ABDOMINAL PAIN, UNSPECIFIED SITE 08/05/2009  . TIA 05/17/2009  . 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 ANXIETY 06/30/2008  . URGENCY OF URINATION 06/30/2008  . DIABETES MELLITUS, II, COMPLICATIONS [id number]  . HYPERLIPIDEMIA 02/27/2007  . Anemia 02/27/2007  . DEPRESSIVE DISORDER, NOS 02/27/2007  . HYPERTENSION, BENIGN SYSTEMIC 02/27/2007  . CORONARY, ARTERIOSCLEROSIS 02/27/2007  . GASTROESOPHAGEAL REFLUX, NO ESOPHAGITIS 02/27/2007  . BACK PAIN, LOW 02/27/2007  . FIBROMYALGIA, FIBROMYOSITIS 02/27/2007    Orientation RESPIRATION BLADDER Height & Weight     Self, Time, Situation, Place  Normal Continent Weight: 115 lb (52.164 kg) Height:  5\' 3"  (160 cm)  BEHAVIORAL SYMPTOMS/MOOD NEUROLOGICAL BOWEL NUTRITION STATUS      Continent Diet (regular diet)  AMBULATORY STATUS COMMUNICATION OF NEEDS Skin   Extensive Assist Verbally Surgical wounds                       Personal Care Assistance Level of Assistance  Bathing, Feeding, Dressing Bathing Assistance: Limited assistance Feeding assistance: Independent Dressing Assistance: Limited assistance     Functional Limitations Info  Sight, Hearing, Speech Sight Info: Adequate Hearing Info: Adequate Speech Info: Adequate    SPECIAL CARE FACTORS FREQUENCY  PT (By licensed PT),  OT (By licensed OT)     PT Frequency: 5x OT Frequency: 5x            Contractures Contractures Info: Not present    Additional Factors Info  Code Status, Allergies, Insulin Sliding Scale Code Status Info: Full Code  Allergies Info:  Hydralazine Hcl, Bisphosphonates, Codeine Phosphate, Penicillins, Sulfa Antibiotics   Insulin Sliding Scale Info: every 4 hours,   3x a day with meal        Current Medications (06/09/2016):  This is the current hospital active medication list Current Facility-Administered Medications  Medication Dose Route Frequency Provider Last Rate Last Dose  . 0.9 %  sodium chloride infusion   Intravenous Once Clanford L Johnson, MD      . acetaminophen (TYLENOL) tablet 650 mg  650 mg Oral Q6H PRN Ainsley Spinner, PA-C       Or  . acetaminophen (TYLENOL) suppository 650 mg  650 mg Rectal Q6H PRN Ainsley Spinner, PA-C      . ALPRAZolam Duanne Moron) tablet 0.5-1 mg  0.5-1 mg Oral TID PRN Elwin Mocha, MD   1 mg at 06/08/16 [id number]  . carvedilol (COREG) tablet 3.125 mg  3.125 mg Oral BID WC Elwin Mocha, MD   3.125 mg at 06/09/16 0930  . enoxaparin (LOVENOX) injection 30 mg  30 mg Subcutaneous Q24H Ainsley Spinner, PA-C   30 mg at 06/09/16 0931  . feeding supplement (ENSURE ENLIVE) (ENSURE ENLIVE) liquid 237 mL  237 mL Oral BID BM Clanford Marisa Hua, MD   237 mL at 06/09/16 0927  . FLUoxetine (PROZAC) capsule 20 mg  20 mg Oral Daily Elwin Mocha, MD   20 mg at 06/09/16 0931  . gabapentin (NEURONTIN) capsule 600 mg  600 mg Oral TID Elwin Mocha, MD   600 mg at 06/09/16 0931  . HYDROcodone-acetaminophen (NORCO/VICODIN) 5-325 MG per tablet 1-2 tablet  1-2 tablet Oral Q6H PRN Ainsley Spinner, PA-C   2 tablet at 06/08/16 2018  . insulin aspart (novoLOG) injection 0-9 Units  0-9 Units Subcutaneous Q4H Clanford Marisa Hua, MD   3 Units at 06/09/16 0442  . insulin aspart (novoLOG) injection 3 Units  3 Units Subcutaneous TID WC Clanford Marisa Hua, MD   3 Units at 06/09/16 0800  . isosorbide mononitrate (IMDUR) 24 hr tablet 60 mg  60 mg Oral Daily Elwin Mocha, MD   60 mg at 06/09/16 0930  . lactated ringers infusion   Intravenous Continuous Oleta Mouse, MD 10 mL/hr at 06/07/16 1713    . levofloxacin (LEVAQUIN) tablet 500 mg   500 mg Oral Q48H Clanford L Johnson, MD   500 mg at 06/08/16 1058  . lisinopril (PRINIVIL,ZESTRIL) tablet 2.5 mg  2.5 mg Oral QHS Elwin Mocha, MD   2.5 mg at 06/08/16 2018  . menthol-cetylpyridinium (CEPACOL) lozenge 3 mg  1 lozenge Oral PRN Ainsley Spinner, PA-C       Or  . phenol (CHLORASEPTIC) mouth spray 1 spray  1 spray Mouth/Throat PRN Ainsley Spinner, PA-C      . metoCLOPramide (REGLAN) tablet 5-10 mg  5-10 mg Oral Q8H PRN Ainsley Spinner, PA-C       Or  . metoCLOPramide (REGLAN) injection 5-10 mg  5-10 mg Intravenous Q8H PRN Ainsley Spinner, PA-C      . morphine 2 MG/ML injection 0.5-1 mg  0.5-1 mg Intravenous Q2H PRN Ainsley Spinner, PA-C   1 mg at 06/09/16 0112  . nitroGLYCERIN (NITROSTAT) SL tablet 0.4 mg  0.4 mg Sublingual Q5 min PRN Elwin Mocha, MD      . ondansetron [facility]) tablet 4 mg  4 mg Oral Q6H PRN Ainsley Spinner, PA-C       Or  . ondansetron [facility]) injection 4 mg  4 mg Intravenous Q6H PRN Ainsley Spinner, PA-C   4 mg at 06/09/16 0443  . pantoprazole (PROTONIX) EC tablet 40 mg  40 mg Oral Daily Elwin Mocha, MD   40 mg at 06/09/16 0931  . simvastatin (ZOCOR) tablet 20 mg  20 mg Oral QHS Elwin Mocha, MD   20 mg at 06/08/16 2019  . topiramate (TOPAMAX) tablet 100 mg  100 mg Oral BID Elwin Mocha, MD   100 mg at 06/09/16 [id number]     Discharge Medications: Please see discharge summary for a list of discharge medications.  Relevant Imaging Results:  Relevant Lab Results:   Additional Information SSN:  [id number]  Lilly Cove, [place]

## 2016-06-09 NOTE — Clinical Social Work Note (Signed)
Clinical Social Work Assessment  Patient Details  Name: [name] MRN: [id number] Date of Birth: July 21, 1951  Date of referral:  06/09/16               Reason for consult:  Facility Placement                Permission sought to share information with:  Case Manager, Customer service manager Permission granted to share information::  Yes, Verbal Permission Granted  Name::        Agency::  Referral SNF  Relationship::     Contact Information:     Housing/Transportation Living arrangements for the past 2 months:  Single Family Home Source of Information:  Patient, Medical Team Patient Interpreter Needed:  None Criminal Activity/Legal Involvement Pertinent to Current Situation/Hospitalization:  No - Comment as needed Significant Relationships:  Adult Children, Other Family Members Lives with:  Self Do you feel safe going back to the place where you live?  No Need for family participation in patient care:  No (Coment)  Care giving concerns:  Patient reports prior to hospitalization she was living alone at home with limited support. She has a son and daughter in law, but unable to stay with her at [place]. She reports at this time she is having trouble with mobility, pain control, and moving her legs. She is open to referral for SNF and wants to follow recommendations from treatment team to ensure safety and improvement.     Social Worker assessment / plan:  LCSW completed assessment and referral for SNF work up.  LCSW spoke with patient at bedside, no family present and she did not want any called. She is alert and oriented x4, complaining of pain and anxious due to hurting her leg and needing help.  She is aware of consult and need for SNF at this time for short term rehab. She has given permission to fax clinicals out to [facility] (her first choice) and [facility] as a back up.  Will have unit and weekend CSW follow up with bed offers.  Plan: SNF at [place].  Awaiting bed offers.   SNF work up complete. Will need EMS at [place]  Employment status:  Disabled (Comment on whether or not currently receiving Disability) Insurance information:    PT Recommendations:  [place] / Referral to community resources:  [place]  Patient/Family's Response to care:  Agreeable to plan  Patient/Family's Understanding of and Emotional Response to Diagnosis, Current Treatment, and Prognosis:  Patient understanding of recommendation and course of treatment. She expresses her fears of nursing home and not wanting to go as she has done therapy in the past at home, but understands it is unsafe to return home alone as she needs a lot of care from nursing currently.  Emotional Assessment Appearance:  Appears older than stated age Attitude/Demeanor/Rapport:  Other (pleasant and open to assessment) Affect (typically observed):  Accepting, Anxious Orientation:  Oriented to Self, Oriented to Place, Oriented to  Time, Oriented to Situation Alcohol / Substance use:  Not Applicable Psych involvement (Current and /or in the community):  No (Comment)  Discharge Needs  Concerns to be addressed:  No discharge needs identified Readmission within the last 30 days:  No Current discharge risk:  None Barriers to Discharge:  Continued Medical Work up, [place], [place], [place] 06/09/2016, 9:49 AM

## 2016-06-09 NOTE — Progress Notes (Signed)
SPORTS MEDICINE AND JOINT REPLACEMENT  Lara Mulch, MD   [facility] PA-C [place], [place], [place]  [address]                             [phone]   PROGRESS NOTE  Subjective:  negative for Chest Pain  negative for Shortness of Breath  negative for Nausea/Vomiting   negative for Calf Pain  negative for Bowel Movement   Tolerating Diet: yes         Patient reports pain as 6 on 0-10 scale.    Objective: Vital signs in last 24 hours:   Patient Vitals for the past 24 hrs:  BP Temp Temp src Pulse Resp SpO2  06/09/16 0929 134/60 mmHg 98.7 F (37.1 C) Oral 88 16 98 %  06/09/16 0430 (!) 143/63 mmHg 98.3 F (36.8 C) Oral 85 16 100 %  06/08/16 2030 (!) 140/51 mmHg 98.6 F (37 C) Oral (!) 103 16 94 %    @flow {1959:LAST@   Intake/Output from previous day:   06/09 0701 - 06/10 0700 In: 720 [P.O.:720] Out: 1    Intake/Output this shift:       Intake/Output      06/09 0701 - 06/10 0700 06/10 0701 - 06/11 0700   P.O. 720    I.V. (mL/kg)     Total Intake(mL/kg) 720 (13.8)    Urine (mL/kg/hr) 0 (0)    Emesis/NG output 1 (0)    Blood     Total Output 1     Net +719          Urine Occurrence 2 x       LABORATORY DATA:  Recent Labs  06/05/16 1740 06/06/16 0652 06/07/16 0350 06/08/16 0402 06/09/16 0258  WBC 3.7* 6.0 5.8 4.9 4.2  HGB 7.3* 10.4* 10.1* 8.8* 7.9*  HCT 22.6* 31.9* 30.7* 26.9* 23.9*  PLT 170 108* 113* 100* 89*    Recent Labs  06/05/16 1740 06/06/16 0652 06/07/16 0350 06/08/16 0402 06/09/16 0258  NA 141 141 135 136 135  K 4.4 4.4 3.6 3.6 3.7  CL 111 110 110 111 108  CO2 23 25 21* 19* 22  BUN 50* 43* 28* 26* 31*  CREATININE 2.08* 1.82* 1.51* 1.63* 2.02*  GLUCOSE 130* 131* 80 199* 218*  CALCIUM 8.9 8.8* 8.0*  7.7* 7.6* 7.8*   Lab Results  Component Value Date   INR 1.24 06/07/2016   INR 1.17 06/06/2016   INR 1.25 06/05/2016    Examination:  General appearance: alert, cooperative and no distress Extremities:  extremities normal, atraumatic, no cyanosis or edema  Wound Exam: clean, dry, intact   Drainage:  None: wound tissue dry  Motor Exam: Quadriceps and Hamstrings Intact  Sensory Exam: Superficial Peroneal, Deep Peroneal and Tibial normal   Assessment:    2 Days Post-Op  Procedure(s) (LRB): ARTHROPLASTY BIPOLAR HIP (HEMIARTHROPLASTY) (Right)  ADDITIONAL DIAGNOSIS:  Principal Problem:   Fracture of femoral neck, right (HCC) Active Problems:   Symptomatic anemia   CKD (chronic kidney disease) stage 4, GFR 15-29 ml/min (HCC)   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 She will be transfused today with PRBC due to HGB < 8 which is being controlled by family med.  Dressing looks good today. Will recheck tomorrow         Donia Ast 06/09/2016, 9:41 AM

## 2016-06-09 NOTE — Progress Notes (Signed)
PROGRESS NOTE  06/09/2016 8:01 AM   [name]  [id number]  DOB: 12-Oct-1951  DOA: 06/05/2016 PCP: [name], MD Outpatient Specialists:   Hospital course: With past medical history significant for diabetes, chronic anemia, coronary artery disease status post recent stent placement and chronic kidney disease presents to emergency room after fall. Patient states that she has fallen once every few days over the last 2 weeks. At baseline she uses a walker. She does not always use it as she is supposed to. Patient denies any prodrome prior to her falls. She states when this has occured her legs just get weak and shaky and then she loses her balance and topples over. Both times when she has fallen she has hit her head on the tile floor in her house. She denies any loss of consciousness. Patient does live alone. Patient was brought to the emergency room due to this fall by EMS and was found to have a femoral neck fracture on x-ray. Orthopedics consult in the emergency room and surgery is planned for 6/8. She was found to be anemic and 2 units of bloodgiven prior to surgery.  Assessment & Plan: Acute Right femoral neck fracture Orthopedics consulted: Plan to OR on 06/07/16. Surgery performed on 6/8. Planning for SNF, social worker involved.     Prolonged QT - resolved magnesium and phosphorus WNL. EKG repeated this morning within normal limits.  prolonged QTcResolved. Patient did have low potassium when she first arrived but has corrected. With correction of anemia and electrolytes EKG could normalize. If normal surgery can occur. If not consider cardiology consult. Revised cardiac risk index for preoperative risk score of 4, 11% risk of major cardiac event  Fall with head trauma & low H/H CT head and neck no acute findings.  Patient with baseline mentation per family Likely due to deconditioning and acute worsening of her anemia currently being transfused 2 units started in  the emergency room Currently holding aspirin and Plavix.  Postop Anemia -Hemoglobin less than 8 now, will transfuse PRBC given CAD and recent MI -Repeat HH in AM  Thrombocytopenia  - No active bleeding, following - currently holding aspirin  - currently on lovenox for DVT prophylaxis, will follow platelets closely.  Chronic Kidney Disease Stage 4 - following creatinine, holding around 2. Will monitor.  - Avoid nephrotoxic medication  UTI -treated with levofloxacin  Hypertension lisinopril 2.5 mg by mouth daily at bedtime Coreg 3.125 mg twice a day Imdur 60 mg daily  CKD stage 4  CAD Nitroglycerin when necessary Holding aspirin and Plavix  Hyperlipidemia Zocor 20 mg daily at bedtime  Fibromyalgia and all over body tremor Gabapentin 300 mg 3 times a day Topamax 100 mg twice a day  Depressive disorder/anxiety Prozac 20 mg daily Xanax when necessary 3 times a day  Diabetes - type 2, insulin requiring Patient is a known brittle diabetic with history of severe hypoglycemia Stopped basal insulin as she has been having lows. Given small dose of insulin with meals. Strict hypoglycemia precautions recommended.   Reflux Protonix 40 mg by mouth daily  Code Status: full code DVT Prophylaxis: SCD Family Communication: yes Disposition Plan: PT consult ordered for tomorrow  Consultants:  [facility]  Procedures: Right Femoral neck fracture and hemi-arthroplasty  Antimicrobials:  levofloxacin   Subjective: Pt complain of right hip pain  Objective: Filed Vitals:   06/08/16 0426 06/08/16 0612 06/08/16 2030 06/09/16 0430  BP: 142/62  140/51 143/63  Pulse: 82  103 85  Temp: 99.5 F (37.5 C) 99.4 F (37.4 C) 98.6 F (37 C) 98.3 F (36.8 C)  TempSrc: Oral Oral Oral Oral  Resp: 16  16 16   Height:      Weight:      SpO2: 100%  94% 100%    Intake/Output Summary (Last 24 hours) at 06/09/16 0801 Last data filed at 06/08/16 2100  Gross per 24  hour  Intake    720 ml  Output      1 ml  Net    719 ml   Filed Weights   06/05/16 1737  Weight: 115 lb (52.164 kg)   Exam:  General exam: Pt is thin, emaciated appearing, awake, alert, no distress, cooperative.  Respiratory system: Clear. No increased work of breathing. Cardiovascular system: S1 & S2 heard, RRR. No JVD, murmurs, gallops, clicks or pedal edema. Gastrointestinal system: Abdomen is nondistended, soft and nontender. Normal bowel sounds heard. Central nervous system: Alert and oriented. No focal neurological deficits. Extremities: bandages clean and dry.  Data Reviewed: Basic Metabolic Panel:  Recent Labs Lab 06/05/16 1740 06/05/16 1802 06/06/16 0652 06/07/16 0350 06/08/16 0402 06/09/16 0258  NA 141  --  141 135 136 135  K 4.4  --  4.4 3.6 3.6 3.7  CL 111  --  110 110 111 108  CO2 23  --  25 21* 19* 22  GLUCOSE 130*  --  131* 80 199* 218*  BUN 50*  --  43* 28* 26* 31*  CREATININE 2.08*  --  1.82* 1.51* 1.63* 2.02*  CALCIUM 8.9  --  8.8* 8.0*  7.7* 7.6* 7.8*  MG  --  1.9  --   --   --   --   PHOS  --  3.8  --   --   --   --    Liver Function Tests:  Recent Labs Lab 06/05/16 1740 06/07/16 0350  AST 28 23  ALT 24 19  ALKPHOS 45 30*  BILITOT 0.6 1.1  PROT 5.4* 4.6*  ALBUMIN 3.0* 2.4*   No results for input(s): LIPASE, AMYLASE in the last 168 hours. No results for input(s): AMMONIA in the last 168 hours. CBC:  Recent Labs Lab 06/05/16 1740 06/06/16 0652 06/07/16 0350 06/08/16 0402 06/09/16 0258  WBC 3.7* 6.0 5.8 4.9 4.2  NEUTROABS 2.5  --  4.7  --  3.0  HGB 7.3* 10.4* 10.1* 8.8* 7.9*  HCT 22.6* 31.9* 30.7* 26.9* 23.9*  MCV 96.2 88.9 88.5 89.1 88.2  PLT 170 108* 113* 100* 89*   Cardiac Enzymes: No results for input(s): CKTOTAL, CKMB, CKMBINDEX, TROPONINI in the last 168 hours. BNP (last 3 results) No results for input(s): PROBNP in the last 8760 hours. CBG:  Recent Labs Lab 06/08/16 1159 06/08/16 1703 06/08/16 2013  06/09/16 0034 06/09/16 0433  GLUCAP 117* 114* 191* 230* 182*    Recent Results (from the past 240 hour(s))  Surgical pcr screen     Status: None   Collection Time: 06/06/16  2:00 AM  Result Value Ref Range Status   MRSA, PCR NEGATIVE NEGATIVE Final   Staphylococcus aureus NEGATIVE NEGATIVE Final    Comment:        The Xpert SA Assay (FDA approved for NASAL specimens in patients over 35 years of age), is one component of a comprehensive surveillance program.  Test performance has been validated by [facility] for patients greater than or equal to 81 year old. It is not intended to diagnose infection nor to  guide or monitor treatment.      Studies: Dg Hip Port Unilat With Pelvis 1v Right  06/07/2016  CLINICAL DATA:  Right femoral neck fracture.  Postop imaging. EXAM: DG HIP (WITH OR WITHOUT PELVIS) 1V PORT RIGHT COMPARISON:  Two days ago FINDINGS: New bipolar right hip hemiarthroplasty without periprosthetic fracture. The prosthesis is located. Negative visualized bony pelvis and left hip. Osteopenia and atherosclerosis. IMPRESSION: No adverse finding after right hip hemiarthroplasty. Electronically Signed   By: Monte Fantasia M.D.   On: 06/07/2016 22:24     Scheduled Meds: . sodium chloride   Intravenous Once  . carvedilol  3.125 mg Oral BID WC  . enoxaparin (LOVENOX) injection  30 mg Subcutaneous Q24H  . feeding supplement (ENSURE ENLIVE)  237 mL Oral BID BM  . FLUoxetine  20 mg Oral Daily  . gabapentin  600 mg Oral TID  . insulin aspart  0-9 Units Subcutaneous Q4H  . insulin aspart  3 Units Subcutaneous TID WC  . isosorbide mononitrate  60 mg Oral Daily  . levofloxacin  500 mg Oral Q48H  . lisinopril  2.5 mg Oral QHS  . pantoprazole  40 mg Oral Daily  . simvastatin  20 mg Oral QHS  . topiramate  100 mg Oral BID   Continuous Infusions: . 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

## 2016-06-09 NOTE — Progress Notes (Signed)
Physical Therapy Treatment Patient Details Name: [name] MRN: [id number] DOB: [birth year] Today's Date: 06/09/2016    History of Present Illness pt presents after multiple falls at home and sustained a R Femoral Neck fx now s/p Hemiarthroplasty.  pt with hx of falls, DM, Anemia, CAD, and CKD.      PT Comments    Pt showing good improvement with mobility. She was able to ambulate 5 feet with RW. Pt reports feeling constipated. RN notified.  Follow Up Recommendations  SNF     Equipment Recommendations  None recommended by PT    Recommendations for Other Services       Precautions / Restrictions Precautions Precautions: Fall;Posterior Hip Precaution Comments: Reviewed 3/3 hip precautions. Restrictions RLE Weight Bearing: Weight bearing as tolerated    Mobility  Bed Mobility         Supine to sit: Mod assist     General bed mobility comments: verbal cues for sequencing and precautions  Transfers   Equipment used: Rolling walker (2 wheeled)   Sit to Stand: Mod assist;+2 physical assistance Stand pivot transfers: Mod assist;+2 safety/equipment       General transfer comment: verbal cues for sequencing and precautions  Ambulation/Gait Ambulation/Gait assistance: Mod assist;+2 safety/equipment Ambulation Distance (Feet): 5 Feet Assistive device: Rolling walker (2 wheeled) Gait Pattern/deviations: Step-to pattern;Antalgic;Decreased stride length Gait velocity: decreased Gait velocity interpretation: Below normal speed for age/gender General Gait Details: verbal cues for sequencing   Stairs            Wheelchair Mobility    Modified Rankin (Stroke Patients Only)       Balance                                    Cognition Arousal/Alertness: Awake/alert Behavior During Therapy: Anxious Overall Cognitive Status: Within Functional Limits for tasks assessed                      Exercises      General Comments         Pertinent Vitals/Pain Pain Assessment: 0-10 Pain Score: 5  Pain Location: R hip Pain Descriptors / Indicators: Sore Pain Intervention(s): Limited activity within patient's tolerance;Monitored during session;Repositioned    Home Living                      Prior Function            PT Goals (current goals can now be found in the care plan section) Acute Rehab PT Goals Patient Stated Goal: home PT Goal Formulation: With patient/family Time For Goal Achievement: 06/22/16 Potential to Achieve Goals: Good Progress towards PT goals: Progressing toward goals    Frequency  Min 3X/week    PT Plan Current plan remains appropriate    Co-evaluation             End of Session Equipment Utilized During Treatment: Gait belt Activity Tolerance: Patient tolerated treatment well Patient left: in chair;with call bell/phone within reach;with chair alarm set     Time: [id number] PT Time Calculation (min) (ACUTE ONLY): 27 min  Charges:  $Gait Training: 8-22 mins $Therapeutic Activity: 8-22 mins                    G Codes:      Lorriane Shire 06/09/2016, 1:00 PM

## 2016-06-10 LAB — TYPE AND SCREEN
ABO/RH(D): O POS
ANTIBODY SCREEN: NEGATIVE
UNIT DIVISION: 0
UNIT DIVISION: 0
Unit division: 0
Unit division: 0

## 2016-06-10 LAB — BASIC METABOLIC PANEL
ANION GAP: 8 (ref 5–15)
BUN: 30 mg/dL — ABNORMAL HIGH (ref 6–20)
CALCIUM: 8.2 mg/dL — AB (ref 8.9–10.3)
CO2: 20 mmol/L — AB (ref 22–32)
Chloride: 108 mmol/L (ref 101–111)
Creatinine, Ser: 1.94 mg/dL — ABNORMAL HIGH (ref 0.44–1.00)
GFR, EST AFRICAN AMERICAN: 30 mL/min — AB (ref >60)
GFR, EST NON AFRICAN AMERICAN: 26 mL/min — AB (ref >60)
Glucose, Bld: 199 mg/dL — ABNORMAL HIGH (ref 65–99)
Potassium: 3.7 mmol/L (ref 3.5–5.1)
Sodium: 136 mmol/L (ref 135–145)

## 2016-06-10 LAB — CBC
HEMATOCRIT: 34.3 % — AB (ref 36.0–46.0)
Hemoglobin: 11.6 g/dL — ABNORMAL LOW (ref 12.0–15.0)
MCH: 29.5 pg (ref 26.0–34.0)
MCHC: 33.8 g/dL (ref 30.0–36.0)
MCV: 87.3 fL (ref 78.0–100.0)
PLATELETS: 96 10*3/uL — AB (ref 150–400)
RBC: 3.93 MIL/uL (ref 3.87–5.11)
RDW: 16 % — AB (ref 11.5–15.5)
WBC: 4.6 10*3/uL (ref 4.0–10.5)

## 2016-06-10 LAB — GLUCOSE, CAPILLARY
GLUCOSE-CAPILLARY: 196 mg/dL — AB (ref 65–99)
GLUCOSE-CAPILLARY: 190 mg/dL — AB (ref 65–99)
GLUCOSE-CAPILLARY: 180 mg/dL — AB (ref 65–99)
Glucose-Capillary: 198 mg/dL — ABNORMAL HIGH (ref 65–99)

## 2016-06-10 MED ORDER — LISINOPRIL 5 MG PO TABS
5.0000 mg | ORAL_TABLET | Freq: Every day | ORAL | Status: DC
  Administered 2016-06-10: 5 mg via ORAL
  Filled 2016-06-10: qty 1

## 2016-06-10 MED ORDER — CLONIDINE HCL 0.1 MG PO TABS
0.1000 mg | ORAL_TABLET | Freq: Once | ORAL | Status: AC
  Administered 2016-06-10: 0.1 mg via ORAL
  Filled 2016-06-10: qty 1

## 2016-06-10 MED ORDER — LABETALOL HCL 5 MG/ML IV SOLN
10.0000 mg | Freq: Four times a day (QID) | INTRAVENOUS | Status: DC | PRN
  Filled 2016-06-10: qty 4

## 2016-06-10 MED ORDER — AMLODIPINE BESYLATE 5 MG PO TABS
5.0000 mg | ORAL_TABLET | Freq: Every day | ORAL | Status: DC
  Administered 2016-06-10 – 2016-06-11 (×2): 5 mg via ORAL
  Filled 2016-06-10 (×2): qty 1

## 2016-06-10 NOTE — Progress Notes (Signed)
SPORTS MEDICINE AND JOINT REPLACEMENT  Lara Mulch, MD   [facility] PA-C [place], [place], [place]  [address]                             [phone]   PROGRESS NOTE  Subjective:  negative for Chest Pain  negative for Shortness of Breath  negative for Nausea/Vomiting   negative for Calf Pain  negative for Bowel Movement   Tolerating Diet: yes         Patient reports pain as 6 on 0-10 scale.    Objective: Vital signs in last 24 hours:   Patient Vitals for the past 24 hrs:  BP Temp Temp src Pulse Resp SpO2  06/10/16 0854 (!) 191/81 mmHg - - 83 - -  06/10/16 0405 (!) 156/74 mmHg 98.2 F (36.8 C) Oral 73 16 99 %  06/09/16 2150 (!) 177/76 mmHg 99 F (37.2 C) Oral 83 16 98 %  06/09/16 1943 (!) 170/73 mmHg (!) 101.3 F (38.5 C) Oral 90 16 97 %  06/09/16 1805 (!) 170/63 mmHg (!) 100.7 F (38.2 C) Oral 92 16 97 %  06/09/16 1753 130/77 mmHg - - 98 - -  06/09/16 1540 (!) 167/93 mmHg 99 F (37.2 C) Oral 88 16 98 %  06/09/16 1509 (!) 158/72 mmHg 99.4 F (37.4 C) Oral 91 16 97 %  06/09/16 1355 (!) 137/51 mmHg 98.4 F (36.9 C) Oral 93 16 97 %  06/09/16 1237 (!) 170/76 mmHg 99.1 F (37.3 C) Oral 89 16 98 %  06/09/16 1205 (!) 158/64 mmHg 99.3 F (37.4 C) Oral 91 16 96 %    @flow {1959:LAST@   Intake/Output from previous day:   06/10 0701 - 06/11 0700 In: 614  Out: -    Intake/Output this shift:       Intake/Output      06/10 0701 - 06/11 0700 06/11 0701 - 06/12 0700   P.O.     Blood 614    Total Intake(mL/kg) 614 (11.8)    Urine (mL/kg/hr)     Emesis/NG output     Total Output       Net +614          Urine Occurrence 3 x    Stool Occurrence 2 x       LABORATORY DATA:  Recent Labs  06/05/16 1740 06/06/16 0652 06/07/16 0350 06/08/16 0402 06/09/16 0258 06/10/16 0815  WBC 3.7* 6.0 5.8 4.9 4.2 4.6  HGB 7.3* 10.4* 10.1* 8.8* 7.9* 11.6*  HCT 22.6* 31.9* 30.7* 26.9* 23.9* 34.3*  PLT 170 108* 113* 100* 89* 96*    Recent Labs  06/05/16 1740  06/06/16 0652 06/07/16 0350 06/08/16 0402 06/09/16 0258 06/10/16 0815  NA 141 141 135 136 135 136  K 4.4 4.4 3.6 3.6 3.7 3.7  CL 111 110 110 111 108 108  CO2 23 25 21* 19* 22 20*  BUN 50* 43* 28* 26* 31* 30*  CREATININE 2.08* 1.82* 1.51* 1.63* 2.02* 1.94*  GLUCOSE 130* 131* 80 199* 218* 199*  CALCIUM 8.9 8.8* 8.0*  7.7* 7.6* 7.8* 8.2*   Lab Results  Component Value Date   INR 1.24 06/07/2016   INR 1.17 06/06/2016   INR 1.25 06/05/2016    Examination:  General appearance: alert, cooperative and no distress Extremities: extremities normal, atraumatic, no cyanosis or edema  Wound Exam: clean, dry, intact   Drainage:  None: wound tissue dry  Motor Exam: Quadriceps and Hamstrings Intact  Sensory Exam: Superficial Peroneal, Deep Peroneal and Tibial normal   Assessment:    3 Days Post-Op  Procedure(s) (LRB): ARTHROPLASTY BIPOLAR HIP (HEMIARTHROPLASTY) (Right)  ADDITIONAL DIAGNOSIS:  Principal Problem:   Fracture of femoral neck, right (HCC) Active Problems:   Symptomatic anemia   CKD (chronic kidney disease) stage 4, GFR 15-29 ml/min (HCC)   DM 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 Still being followed by family med. They will continue to transfuse PRBC is HGB <8         Donia Ast 06/10/2016, 10:19 AM

## 2016-06-10 NOTE — Progress Notes (Addendum)
PROGRESS NOTE  06/10/2016 10:44 AM   [name]  [id number]  DOB: 1951/05/15  DOA: 06/05/2016 PCP: [name], MD Outpatient Specialists:  Hospital course: With past medical history significant for diabetes, chronic anemia, coronary artery disease status post recent stent placement and chronic kidney disease presents to emergency room after fall. Patient states that she has fallen once every few days over the last 2 weeks. At baseline she uses a walker. She does not always use it as she is supposed to. Patient denies any prodrome prior to her falls. She states when this has occured her legs just get weak and shaky and then she loses her balance and topples over. Both times when she has fallen she has hit her head on the tile floor in her house. She denies any loss of consciousness. Patient does live alone. Patient was brought to the emergency room due to this fall by EMS and was found to have a femoral neck fracture on x-ray. Orthopedics consult in the emergency room and surgery is planned for 6/8. She was found to be anemic and 2 units of bloodgiven prior to surgery and has received 2 units post op.   Assessment & Plan: Acute Right femoral neck fracture Orthopedics consulted: Plan to OR on 06/07/16. Surgery performed on 6/8. Planning for SNF, social worker involved.     Prolonged QT - resolved magnesium and phosphorus WNL. EKG repeated this morning within normal limits.  prolonged QTcResolved. Patient did have low potassium when she first arrived but has corrected. With correction of anemia and electrolytes EKG could normalize. If normal surgery can occur. If not consider cardiology consult. Revised cardiac risk index for preoperative risk score of 4, 11% risk of major cardiac event  Fall with head trauma & low H/H CT head and neck no acute findings.  Patient with baseline mentation per family Likely due to deconditioning and acute worsening of her anemia currently being  transfused 2 units started in the emergency room Currently holding aspirin and Plavix.  Postop Anemia -Hemoglobin less than 8 now, transfused PRBC 6/10 given CAD and recent MI -Hemoglobin 11 post transfusion on 6/10.  Thrombocytopenia  - No active bleeding, following - currently holding aspirin  - currently on lovenox for DVT prophylaxis, will follow platelets closely.  Chronic Kidney Disease Stage 4 - following creatinine, holding around 2. Will monitor.  - Avoid nephrotoxic medication  UTI -treated with levofloxacin  Hypertension Increasing lisinopril to 5 mg  Coreg 3.125 mg twice a day Imdur 60 mg daily  CKD stage 4  CAD Nitroglycerin when necessary Holding aspirin and Plavix  Hyperlipidemia Zocor 20 mg daily at bedtime  Fibromyalgia and all over body tremor Gabapentin 300 mg 3 times a day Topamax 100 mg twice a day  Depressive disorder/anxiety Prozac 20 mg daily Xanax when necessary 3 times a day  Diabetes - type 2, insulin requiring Patient is a known brittle diabetic with history of severe hypoglycemia Stopped basal insulin as she has been having lows. Given small dose of insulin with meals. Blood sugars better with current management. Strict hypoglycemia precautions recommended.   Reflux Protonix 40 mg by mouth daily  Code Status: full code DVT Prophylaxis: SCD Family Communication: yes Disposition Plan: SNF when bed available  Consultants:  [facility]  Procedures: Right Femoral neck fracture and hemi-arthroplasty  Antimicrobials:  levofloxacin   Subjective: Pt reports doing a little better. Tolerated transfusion well. Awaiting SNF placement  Objective: Filed Vitals:   06/09/16 1943  06/09/16 2150 06/10/16 0405 06/10/16 0854  BP: 170/73 177/76 156/74 191/81  Pulse: 90 83 73 83  Temp: 101.3 F (38.5 C) 99 F (37.2 C) 98.2 F (36.8 C)   TempSrc: Oral Oral Oral   Resp: 16 16 16    Height:      Weight:      SpO2: 97%  98% 99%     Intake/Output Summary (Last 24 hours) at 06/10/16 1044 Last data filed at 06/09/16 1805  Gross per 24 hour  Intake    614 ml  Output      0 ml  Net    614 ml   Filed Weights   06/05/16 1737  Weight: 115 lb (52.164 kg)   Exam:  General exam: Pt is thin, emaciated appearing, awake, alert, no distress, cooperative.  Respiratory system: Clear. No increased work of breathing. Cardiovascular system: S1 & S2 heard, RRR. No JVD, murmurs, gallops, clicks or pedal edema. Gastrointestinal system: Abdomen is nondistended, soft and nontender. Normal bowel sounds heard. Central nervous system: Alert and oriented. No focal neurological deficits. Extremities: bandages clean and dry.  Data Reviewed: Basic Metabolic Panel:  Recent Labs Lab 06/05/16 1802 06/06/16 [id number] 06/07/16 0350 06/08/16 0402 06/09/16 0258 06/10/16 0815  NA  --  141 135 136 135 136  K  --  4.4 3.6 3.6 3.7 3.7  CL  --  110 110 111 108 108  CO2  --  25 21* 19* 22 20*  GLUCOSE  --  131* 80 199* 218* 199*  BUN  --  43* 28* 26* 31* 30*  CREATININE  --  1.82* 1.51* 1.63* 2.02* 1.94*  CALCIUM  --  8.8* 8.0*  7.7* 7.6* 7.8* 8.2*  MG 1.9  --   --   --   --   --   PHOS 3.8  --   --   --   --   --    Liver Function Tests:  Recent Labs Lab 06/05/16 1740 06/07/16 0350  AST 28 23  ALT 24 19  ALKPHOS 45 30*  BILITOT 0.6 1.1  PROT 5.4* 4.6*  ALBUMIN 3.0* 2.4*   No results for input(s): LIPASE, AMYLASE in the last 168 hours. No results for input(s): AMMONIA in the last 168 hours. CBC:  Recent Labs Lab 06/05/16 1740 06/06/16 0652 06/07/16 0350 06/08/16 0402 06/09/16 0258 06/10/16 0815  WBC 3.7* 6.0 5.8 4.9 4.2 4.6  NEUTROABS 2.5  --  4.7  --  3.0  --   HGB 7.3* 10.4* 10.1* 8.8* 7.9* 11.6*  HCT 22.6* 31.9* 30.7* 26.9* 23.9* 34.3*  MCV 96.2 88.9 88.5 89.1 88.2 87.3  PLT 170 108* 113* 100* 89* 96*   Cardiac Enzymes: No results for input(s): CKTOTAL, CKMB, CKMBINDEX, TROPONINI in the last 168  hours. BNP (last 3 results) No results for input(s): PROBNP in the last 8760 hours. CBG:  Recent Labs Lab 06/09/16 0934 06/09/16 1128 06/09/16 1751 06/09/16 2149 06/10/16 0621  GLUCAP 107* 166* 231* 169* 196*    Recent Results (from the past 240 hour(s))  Surgical pcr screen     Status: None   Collection Time: 06/06/16  2:00 AM  Result Value Ref Range Status   MRSA, PCR NEGATIVE NEGATIVE Final   Staphylococcus aureus NEGATIVE NEGATIVE Final    Comment:        The Xpert SA Assay (FDA approved for NASAL specimens in patients over 33 years of age), is one component of a comprehensive surveillance program.  Test  performance has been validated by [facility] for patients greater than or equal to 87 year old. It is not intended to diagnose infection nor to guide or monitor treatment.      Studies: No results found.   Scheduled Meds: . carvedilol  3.125 mg Oral BID WC  . enoxaparin (LOVENOX) injection  30 mg Subcutaneous Q24H  . feeding supplement (ENSURE ENLIVE)  237 mL Oral BID BM  . FLUoxetine  20 mg Oral Daily  . gabapentin  600 mg Oral TID  . insulin aspart  0-9 Units Subcutaneous TID WC  . insulin aspart  3 Units Subcutaneous TID WC  . isosorbide mononitrate  60 mg Oral Daily  . levofloxacin  500 mg Oral Q48H  . lisinopril  2.5 mg Oral QHS  . pantoprazole  40 mg Oral Daily  . simvastatin  20 mg Oral QHS  . topiramate  100 mg Oral BID   Continuous Infusions: . lactated ringers 10 mL/hr at 06/07/16 1713    Principal Problem:   Fracture of femoral neck, right (HCC) Active Problems:   Symptomatic anemia   CKD (chronic kidney disease) stage 4, GFR 15-29 ml/min (HCC)   DM w/o complication type II (HCC)   HLD (hyperlipidemia)   Depressive disorder   HTN (hypertension)   CAD (coronary artery disease)   Fibromyalgia   GERD (gastroesophageal reflux disease)   Head trauma   Decreased hearing of both ears   Hip fracture ([place])   Femoral neck fracture  ([place])  Time spent:   Irwin Brakeman, MD, FAAFP [facility] Pager [phone]  If 7PM-7AM, please contact night-coverage [email or web address] Password [email or web address] 06/10/2016, 10:44 AM    LOS: 5 days

## 2016-06-11 LAB — GLUCOSE, CAPILLARY
GLUCOSE-CAPILLARY: 106 mg/dL — AB (ref 65–99)
GLUCOSE-CAPILLARY: 201 mg/dL — AB (ref 65–99)
Glucose-Capillary: 215 mg/dL — ABNORMAL HIGH (ref 65–99)
Glucose-Capillary: 177 mg/dL — ABNORMAL HIGH (ref 65–99)

## 2016-06-11 MED ORDER — LISINOPRIL 5 MG PO TABS: 5.0000 mg | ORAL_TABLET | Freq: Every day | ORAL | Status: DC

## 2016-06-11 MED ORDER — INSULIN ASPART 100 UNIT/ML ~~LOC~~ SOLN: 3.0000 [IU] | Freq: Three times a day (TID) | SUBCUTANEOUS | Status: DC

## 2016-06-11 MED ORDER — AMLODIPINE BESYLATE 5 MG PO TABS: 5.0000 mg | ORAL_TABLET | Freq: Every day | ORAL | Status: DC

## 2016-06-11 MED ORDER — DIPHENOXYLATE-ATROPINE 2.5-0.025 MG/5ML PO LIQD: 10.0000 mL | Freq: Four times a day (QID) | ORAL | Status: AC | PRN

## 2016-06-11 MED ORDER — BLISTEX MEDICATED EX OINT: 1.0000 "application " | TOPICAL_OINTMENT | CUTANEOUS | Status: AC | PRN

## 2016-06-11 MED ORDER — ALPRAZOLAM 0.5 MG PO TABS: 0.5000 mg | ORAL_TABLET | Freq: Three times a day (TID) | ORAL | Status: AC | PRN

## 2016-06-11 MED ORDER — ENSURE ENLIVE PO LIQD: 237.0000 mL | Freq: Two times a day (BID) | ORAL | Status: DC

## 2016-06-11 MED ORDER — BLISTEX MEDICATED EX OINT
TOPICAL_OINTMENT | CUTANEOUS | Status: DC | PRN
  Administered 2016-06-11: 17:00:00 via TOPICAL
  Filled 2016-06-11: qty 6.3

## 2016-06-11 MED ORDER — DIPHENOXYLATE-ATROPINE 2.5-0.025 MG/5ML PO LIQD: 10.0000 mL | Freq: Four times a day (QID) | ORAL | Status: DC | PRN

## 2016-06-11 NOTE — Discharge Summary (Addendum)
Physician Discharge Summary  [name] [id number] DOB: 1951/01/27 DOA: 06/05/2016  PCP: [name], MD  Admit date: 06/05/2016 Discharge date: 06/11/2016  Disposition:  SNF  Recommendations for Outpatient Follow-up:  1. Follow up with orthopedics in 2 weeks 2. Follow up with primary care provider in 2 weeks 3. Check blood sugars 3 times per day.   Discharge Condition: Stable  Diet - carb consistent dys 3, ensure   Brief/Interim Summary: Hospital course: With past medical history significant for diabetes, chronic anemia, coronary artery disease status post recent stent placement and chronic kidney disease presents to emergency room after fall. Patient states that she has fallen once every few days over the last 2 weeks. At baseline she uses a walker. She does not always use it as she is supposed to. Patient denies any prodrome prior to her falls. She states when this has occured her legs just get weak and shaky and then she loses her balance and topples over. Both times when she has fallen she has hit her head on the tile floor in her house. She denies any loss of consciousness. Patient does live alone. Patient was brought to the emergency room due to this fall by EMS and was found to have a femoral neck fracture on x-ray. Orthopedics consult in the emergency room and surgery is planned for 6/8. She was found to be anemic and 2 units of bloodgiven prior to surgery and has received 2 units post op.   Assessment & Plan: Acute Right femoral neck fracture Orthopedics consulted: Plan to OR on 06/07/16. Surgery performed on 6/8. Planning for SNF, social worker involved.    Prolonged QT - resolved magnesium and phosphorus WNL. EKG repeated this morning within normal limits. prolonged QTcResolved. Patient did have low potassium when she first arrived but has corrected. With correction of anemia and electrolytes EKG could normalize. If normal surgery can occur. If not consider  cardiology consult. Revised cardiac risk index for preoperative risk score of 4, 11% risk of major cardiac event  Fall with head trauma & low H/H CT head and neck no acute findings.  Patient with baseline mentation per family Likely due to deconditioning and acute worsening of her anemia currently being transfused 2 units started in the emergency room Currently holding aspirin and resuming Plavix at discharge.  Postop Anemia -Hemoglobin less than 8 now, transfused PRBC 6/10 given CAD and recent MI -Hemoglobin 11 post transfusion on 6/10.  Thrombocytopenia  - No active bleeding, following - currently holding aspirin  - currently on lovenox for DVT prophylaxis x 14 days per orthopedics, recommend follow platelets outpatient  Chronic Kidney Disease Stage 4 - following creatinine, holding around 2. Recommend monitoring outpatient.  - Avoid nephrotoxic medication  UTI -treated with levofloxacin  Hypertension Increasing lisinopril to 5 mg  Coreg 3.125 mg twice a day Imdur 60 mg daily  Moderate to severe protein calorie malnutrition  CAD Nitroglycerin when necessary Holding aspirin and  Resuming plavix at discharge  Hyperlipidemia Zocor 20 mg daily at bedtime  Fibromyalgia and all over body tremor Gabapentin 300 mg 3 times a day Topamax 100 mg twice a day  Depressive disorder/anxiety Prozac 20 mg daily Xanax when necessary 3 times a day  Diabetes - type 2, insulin requiring Patient is a known brittle diabetic with history of severe hypoglycemia Stopped basal insulin as she has been having lows. Given small dose of insulin with meals. Blood sugars better with current management. Recommend checking blood sugars 3 times  per day.  Strict hypoglycemia precautions recommended.   Reflux Protonix 40 mg by mouth daily  Discharge Diagnoses:  Principal Problem:   Fracture of femoral neck, right (HCC) Active Problems:   Symptomatic anemia   CKD (chronic kidney disease)  stage 4, GFR 15-29 ml/min (HCC)   DM w/o complication type II (HCC)   HLD (hyperlipidemia)   Depressive disorder   HTN (hypertension)   CAD (coronary artery disease)   Fibromyalgia   GERD (gastroesophageal reflux disease)   Head trauma   Decreased hearing of both ears   Hip fracture ([place])   Femoral neck fracture [facility])  Discharge Instructions  Discharge Instructions    Care order/instruction    Complete by:  As directed   Orthopaedic Trauma Service Discharge Instructions   General Discharge Instructions  WEIGHT BEARING STATUS: Weightbearing as tolerated   RANGE OF MOTION/ACTIVITY: posterior hip precautions right hip   Wound Care: daily dressing changes as needed. Ok to shower once wound dry  Do not apply ointments or lotions to wound. Clean wound with soap and water only  See additional instructions below   PAIN MEDICATION USE AND EXPECTATIONS  You have likely been given narcotic medications to help control your pain.  After a traumatic event that results in an fracture (broken bone) with or without surgery, it is ok to use narcotic pain medications to help control one's pain.  We understand that everyone responds to pain differently and each individual patient will be evaluated on a regular basis for the continued need for narcotic medications. Ideally, narcotic medication use should last no more than 6-8 weeks (coinciding with fracture healing).   As a patient it is your responsibility as well to monitor narcotic medication use and report the amount and frequency you use these medications when you come to your office visit.   We would also advise that if you are using narcotic medications, you should take a dose prior to therapy to maximize you participation.  IF YOU ARE ON NARCOTIC MEDICATIONS IT IS NOT PERMISSIBLE TO OPERATE A MOTOR VEHICLE (MOTORCYCLE/CAR/TRUCK/MOPED) OR HEAVY MACHINERY DO NOT MIX NARCOTICS WITH OTHER CNS (CENTRAL NERVOUS SYSTEM) DEPRESSANTS SUCH AS  ALCOHOL  Diet: as you were eating previously.  Can use over the counter stool softeners and bowel preparations, such as Miralax, to help with bowel movements.  Narcotics can be constipating.  Be sure to drink plenty of fluids    STOP SMOKING OR USING NICOTINE PRODUCTS!!!!  As discussed nicotine severely impairs your body's ability to heal surgical and traumatic wounds but also impairs bone healing.  Wounds and bone heal by forming microscopic blood vessels (angiogenesis) and nicotine is a vasoconstrictor (essentially, shrinks blood vessels).  Therefore, if vasoconstriction occurs to these microscopic blood vessels they essentially disappear and are unable to deliver necessary nutrients to the healing tissue.  This is one modifiable factor that you can do to dramatically increase your chances of healing your injury.    (This means no smoking, no nicotine gum, patches, etc)  DO NOT USE NONSTEROIDAL ANTI-INFLAMMATORY DRUGS (NSAID'S)  Using products such as Advil (ibuprofen), Aleve (naproxen), Motrin (ibuprofen) for additional pain control during fracture healing can delay and/or prevent the healing response.  If you would like to take over the counter (OTC) medication, Tylenol (acetaminophen) is ok.  However, some narcotic medications that are given for pain control contain acetaminophen as well. Therefore, you should not exceed more than 4000 mg of tylenol in a day if you do not have  liver disease.  Also note that there are may OTC medicines, such as cold medicines and allergy medicines that my contain tylenol as well.  If you have any questions about medications and/or interactions please ask your doctor/PA or your pharmacist.      ICE AND ELEVATE INJURED/OPERATIVE EXTREMITY  Using ice and elevating the injured extremity above your heart can help with swelling and pain control.  Icing in a pulsatile fashion, such as 20 minutes on and 20 minutes off, can be followed.    Do not place ice directly on  skin. Make sure there is a barrier between to skin and the ice pack.    Using frozen items such as frozen peas works well as the conform nicely to the are that needs to be iced.  USE AN ACE WRAP OR TED HOSE FOR SWELLING CONTROL  In addition to icing and elevation, Ace wraps or TED hose are used to help limit and resolve swelling.  It is recommended to use Ace wraps or TED hose until you are informed to stop.    When using Ace Wraps start the wrapping distally (farthest away from the body) and wrap proximally (closer to the body)   Example: If you had surgery on your leg or thing and you do not have a splint on, start the ace wrap at the toes and work your way up to the thigh        If you had surgery on your upper extremity and do not have a splint on, start the ace wrap at your fingers and work your way up to the upper arm  IF YOU ARE IN A SPLINT OR CAST DO NOT [place]   If your splint gets wet for any reason please contact the office immediately. You may shower in your splint or cast as long as you keep it dry.  This can be done by wrapping in a cast cover or garbage back (or similar)  Do Not stick any thing down your splint or cast such as pencils, money, or hangers to try and scratch yourself with.  If you feel itchy take benadryl as prescribed on the bottle for itching  IF YOU ARE IN A CAM BOOT (BLACK BOOT)  You may remove boot periodically. Perform daily dressing changes as noted below.  Wash the liner of the boot regularly and wear a sock when wearing the boot. It is recommended that you sleep in the boot until told otherwise  CALL THE OFFICE WITH ANY QUESTIONS OR CONCERNS: [phone]        Discharge Pin Site Instructions  Dress pins daily with Kerlix roll starting on POD 2. Wrap the Kerlix so that it tamps the skin down around the pin-skin interface to prevent/limit motion of the skin relative to the pin.  (Pin-skin motion is the primary cause of pain and  infection related to external fixator pin sites).  Remove any crust or coagulum that may obstruct drainage with a saline moistened gauze or soap and water.  After POD 3, if there is no discernable drainage on the pin site dressing, the interval for change can by increased to every other day.  You may shower with the fixator, cleaning all pin sites gently with soap and water.  If you have a surgical wound this needs to be completely dry and without drainage before showering.  The extremity can be lifted by the fixator to facilitate wound care and transfers.  Notify the  office/Doctor if you experience increasing drainage, redness, or pain from a pin site, or if you notice purulent (thick, snot-like) drainage.  Discharge Wound Care Instructions  Do NOT apply any ointments, solutions or lotions to pin sites or surgical wounds.  These prevent needed drainage and even though solutions like hydrogen peroxide kill bacteria, they also damage cells lining the pin sites that help fight infection.  Applying lotions or ointments can keep the wounds moist and can cause them to breakdown and open up as well. This can increase the risk for infection. When in doubt call the office.  Surgical incisions should be dressed daily.  If any drainage is noted, use one layer of adaptic, then gauze, Kerlix, and an ace wrap.  Once the incision is completely dry and without drainage, it may be left open to air out.  Showering may begin 36-48 hours later.  Cleaning gently with soap and water.  Traumatic wounds should be dressed daily as well.    One layer of adaptic, gauze, Kerlix, then ace wrap.  The adaptic can be discontinued once the draining has ceased    If you have a wet to dry dressing: wet the gauze with saline the squeeze as much saline out so the gauze is moist (not soaking wet), place moistened gauze over wound, then place a dry gauze over the moist one, followed by Kerlix wrap, then ace wrap.     Weight  bearing as tolerated    Complete by:  As directed   Laterality:  right  Extremity:  Lower            Medication List    STOP taking these medications        aspirin 81 MG chewable tablet     HYDROcodone-acetaminophen 5-325 MG tablet  Commonly known as:  NORCO/VICODIN     insulin glargine 100 UNIT/ML injection  Commonly known as:  LANTUS     ketoconazole 2 % shampoo  Commonly known as:  NIZORAL     promethazine 25 MG tablet  Commonly known as:  PHENERGAN      TAKE these medications        acetaminophen 325 MG tablet  Commonly known as:  TYLENOL  Take 2 tablets (650 mg total) by mouth every 6 (six) hours as needed for mild pain (or Fever >/= 101).     ALPRAZolam 0.5 MG tablet  Commonly known as:  XANAX  Take 1 tablet (0.5 mg total) by mouth 3 (three) times daily as needed for anxiety.     amLODipine 5 MG tablet  Commonly known as:  NORVASC  Take 1 tablet (5 mg total) by mouth daily.     carvedilol 3.125 MG tablet  Commonly known as:  COREG  Take 1 tablet (3.125 mg total) by mouth 2 (two) times daily with a meal.     clopidogrel 75 MG tablet  Commonly known as:  PLAVIX  Take 75 mg by mouth at bedtime.     diphenoxylate-atropine 2.5-0.025 MG/5ML liquid  Commonly known as:  LOMOTIL  Take 10 mLs by mouth 4 (four) times daily as needed for diarrhea or loose stools.     enoxaparin 30 MG/0.3ML injection  Commonly known as:  LOVENOX  Inject 0.3 mLs (30 mg total) into the skin daily.     feeding supplement (ENSURE ENLIVE) Liqd  Take 237 mLs by mouth 2 (two) times daily between meals.     Fenofibric Acid 135 MG Cpdr  Take 135 mg  by mouth at bedtime.     FLUoxetine 20 MG capsule  Commonly known as:  PROZAC  Take 20 mg by mouth daily.     gabapentin 300 MG capsule  Commonly known as:  NEURONTIN  Take 600 mg by mouth 3 (three) times daily.     GLUCAGON EMERGENCY IJ  Inject 1 mg as directed once as needed (for severe hypoglycemia).     insulin aspart 100  UNIT/ML injection  Commonly known as:  novoLOG  Inject 3 Units into the skin 3 (three) times daily with meals. Pt uses as needed per sliding scale.     isosorbide mononitrate 60 MG 24 hr tablet  Commonly known as:  IMDUR  Take 1 tablet (60 mg total) by mouth daily.     lip balm Oint  Apply 1 application topically as needed for lip care.     lisinopril 5 MG tablet  Commonly known as:  PRINIVIL,ZESTRIL  Take 1 tablet (5 mg total) by mouth at bedtime.     nitroGLYCERIN 0.4 MG SL tablet  Commonly known as:  NITROSTAT  Place 0.4 mg under the tongue every 5 (five) minutes as needed for chest pain.     pantoprazole 40 MG tablet  Commonly known as:  PROTONIX  Take 40 mg by mouth daily.     simvastatin 20 MG tablet  Commonly known as:  ZOCOR  Take 20 mg by mouth at bedtime.     topiramate 100 MG tablet  Commonly known as:  TOPAMAX  Take 100 mg by mouth 2 (two) times daily.     Vitamin D3 5000 units Tabs  Take 5,000 Units by mouth daily.           Follow-up Information    Follow up with HANDY,MICHAEL H, MD. Schedule an appointment as soon as possible for a visit in 2 weeks.   Specialty:  Orthopedic Surgery   Why:  For suture removal, For wound re-check   Contact information:   [place] [address] [place] [address] [phone]      Allergies  Allergen Reactions  . Hydralazine Hcl Swelling  . Bisphosphonates Rash  . Codeine Phosphate Nausea And Vomiting and Rash  . Penicillins Itching, Rash and Other (See Comments)    Has patient had a PCN reaction causing immediate rash, facial/tongue/throat swelling, SOB or lightheadedness with hypotension: No Has patient had a PCN reaction causing severe rash involving mucus membranes or skin necrosis: No Has patient had a PCN reaction that required hospitalization No Has patient had a PCN reaction occurring within the last 10 years: No If all of the above answers are "NO", then may proceed with Cephalosporin use.  Pt  tolerant to cephalosporins   . Sulfa Antibiotics Itching and Rash    Consultations:  Orthopedics   Procedures/Studies: Dg Chest 1 View  06/05/2016  CLINICAL DATA:  Status post fall today with obvious deformity of the right leg. EXAM: CHEST 1 VIEW COMPARISON:  February 07, 2016. FINDINGS: The heart size and mediastinal contours are stable. The heart size is mildly enlarged. Both lungs are clear. The visualized skeletal structures are stable. IMPRESSION: No active cardiopulmonary disease. Electronically Signed   By: Abelardo Diesel M.D.   On: 06/05/2016 18:36   Abd 1 View (kub)  06/06/2016  CLINICAL DATA:  Chronic diarrhea for 3-4 years. Large distal colonic stool burden on recent CT. EXAM: ABDOMEN - 1 VIEW COMPARISON:  CT on 03/11/2016 FINDINGS: No evidence of dilated bowel  loops. A small to moderate amount of stool is seen within the descending and rectosigmoid colon. IMPRESSION: No acute findings.  Small to moderate stool burden again noted. Electronically Signed   By: Earle Gell M.D.   On: 06/06/2016 08:24   Ct Head Wo Contrast  06/05/2016  CLINICAL DATA:  65 year old female with fall and trauma to the head. EXAM: CT HEAD WITHOUT CONTRAST CT CERVICAL SPINE WITHOUT CONTRAST TECHNIQUE: Multidetector CT imaging of the head and cervical spine was performed following the standard protocol without intravenous contrast. Multiplanar CT image reconstructions of the cervical spine were also generated. COMPARISON:  Head CT dated 06/28/2015 FINDINGS: CT HEAD FINDINGS The ventricles and sulci are appropriate in size for patient's age. Mild periventricular and deep white matter chronic microvascular ischemic changes noted. There is no acute intracranial hemorrhage. No mass effect or midline shift noted. There is partial opacification of the right sphenoid sinus. The remainder of the visualized paranasal sinuses and mastoid air cells are clear. The calvarium is intact. CT CERVICAL SPINE FINDINGS There is no acute  fracture or subluxation of the cervical spine.There is osteopenia with minimal degenerative changes.The odontoid and spinous processes are intact.There is normal anatomic alignment of the C1-C2 lateral masses. The visualized soft tissues appear unremarkable. IMPRESSION: No acute intracranial hemorrhage. No Acute/traumatic cervical spine pathology. Electronically Signed   By: Anner Crete M.D.   On: 06/05/2016 20:34   Ct Cervical Spine Wo Contrast  06/05/2016  CLINICAL DATA:  65 year old female with fall and trauma to the head. EXAM: CT HEAD WITHOUT CONTRAST CT CERVICAL SPINE WITHOUT CONTRAST TECHNIQUE: Multidetector CT imaging of the head and cervical spine was performed following the standard protocol without intravenous contrast. Multiplanar CT image reconstructions of the cervical spine were also generated. COMPARISON:  Head CT dated 06/28/2015 FINDINGS: CT HEAD FINDINGS The ventricles and sulci are appropriate in size for patient's age. Mild periventricular and deep white matter chronic microvascular ischemic changes noted. There is no acute intracranial hemorrhage. No mass effect or midline shift noted. There is partial opacification of the right sphenoid sinus. The remainder of the visualized paranasal sinuses and mastoid air cells are clear. The calvarium is intact. CT CERVICAL SPINE FINDINGS There is no acute fracture or subluxation of the cervical spine.There is osteopenia with minimal degenerative changes.The odontoid and spinous processes are intact.There is normal anatomic alignment of the C1-C2 lateral masses. The visualized soft tissues appear unremarkable. IMPRESSION: No acute intracranial hemorrhage. No Acute/traumatic cervical spine pathology. Electronically Signed   By: Anner Crete M.D.   On: 06/05/2016 20:34   Dg Knee Right Port  06/06/2016  CLINICAL DATA:  Femoral neck fracture, fall this morning EXAM: PORTABLE RIGHT KNEE - 1-2 VIEW COMPARISON:  None FINDINGS: Three views of the  right knee submitted. No acute fracture or subluxation. There is diffuse osteopenia. Mild narrowing of medial joint compartment. Small joint effusion. Atherosclerotic calcifications of femoral and popliteal artery. IMPRESSION: No acute fracture or subluxation. Mild degenerative changes. Small joint effusion. Diffuse osteopenia. Electronically Signed   By: Lahoma Crocker M.D.   On: 06/06/2016 12:10   Dg Hip Port Unilat With Pelvis 1v Right  06/07/2016  CLINICAL DATA:  Right femoral neck fracture.  Postop imaging. EXAM: DG HIP (WITH OR WITHOUT PELVIS) 1V PORT RIGHT COMPARISON:  Two days ago FINDINGS: New bipolar right hip hemiarthroplasty without periprosthetic fracture. The prosthesis is located. Negative visualized bony pelvis and left hip. Osteopenia and atherosclerosis. IMPRESSION: No adverse finding after right hip hemiarthroplasty. Electronically  Signed   By: Monte Fantasia M.D.   On: 06/07/2016 22:24   Dg Hip Unilat  With Pelvis 2-3 Views Right  06/05/2016  CLINICAL DATA:  Status post fall today with right leg deformity. EXAM: DG HIP (WITH OR WITHOUT PELVIS) 2-3V RIGHT COMPARISON:  None. FINDINGS: There is displaced fracture of the right femoral neck. No other fracture is identified. There is no dislocation. IMPRESSION: Displaced fracture of the right femoral. Electronically Signed   By: Abelardo Diesel M.D.   On: 06/05/2016 18:35     Subjective: Pt is ambulating much better now.  She was cleared by orthopedics to go to SNF.    Discharge Exam: Filed Vitals:   06/10/16 2142 06/11/16 0454  BP: 145/66 153/72  Pulse: 83 84  Temp:  100.5 F (38.1 C)  Resp:  18   Filed Vitals:   06/10/16 1917 06/10/16 2142 06/11/16 0454 06/11/16 0621  BP: 173/77 145/66 153/72   Pulse: 87 83 84   Temp: 99.5 F (37.5 C)  100.5 F (38.1 C)   TempSrc: Oral  Oral   Resp: 16  18   Height:      Weight:    137 lb 1.6 oz (62.188 kg)  SpO2: 99%  98%     General exam: Pt is thin, emaciated appearing, awake, alert,  no distress, cooperative.  Respiratory system: Clear. No increased work of breathing. Cardiovascular system: S1 & S2 heard, RRR. No JVD, murmurs, gallops, clicks or pedal edema. Gastrointestinal system: Abdomen is nondistended, soft and nontender. Normal bowel sounds heard. Central nervous system: Alert and oriented. No focal neurological deficits. Extremities: bandages clean and dry.   The results of significant diagnostics from this hospitalization (including imaging, microbiology, ancillary and laboratory) are listed below for reference.     Microbiology: Recent Results (from the past 240 hour(s))  Surgical pcr screen     Status: None   Collection Time: 06/06/16  2:00 AM  Result Value Ref Range Status   MRSA, PCR NEGATIVE NEGATIVE Final   Staphylococcus aureus NEGATIVE NEGATIVE Final    Comment:        The Xpert SA Assay (FDA approved for NASAL specimens in patients over 33 years of age), is one component of a comprehensive surveillance program.  Test performance has been validated by [facility] for patients greater than or equal to 54 year old. It is not intended to diagnose infection nor to guide or monitor treatment.      Labs: BNP (last 3 results) No results for input(s): BNP in the last 8760 hours. Basic Metabolic Panel:  Recent Labs Lab 06/05/16 1802 06/06/16 0652 06/07/16 0350 06/08/16 0402 06/09/16 0258 06/10/16 0815  NA  --  141 135 136 135 136  K  --  4.4 3.6 3.6 3.7 3.7  CL  --  110 110 111 108 108  CO2  --  25 21* 19* 22 20*  GLUCOSE  --  131* 80 199* 218* 199*  BUN  --  43* 28* 26* 31* 30*  CREATININE  --  1.82* 1.51* 1.63* 2.02* 1.94*  CALCIUM  --  8.8* 8.0*  7.7* 7.6* 7.8* 8.2*  MG 1.9  --   --   --   --   --   PHOS 3.8  --   --   --   --   --    Liver Function Tests:  Recent Labs Lab 06/05/16 1740 06/07/16 0350  AST 28 23  ALT 24 19  ALKPHOS 45 30*  BILITOT 0.6 1.1  PROT 5.4* 4.6*  ALBUMIN 3.0* 2.4*   No results for input(s):  LIPASE, AMYLASE in the last 168 hours. No results for input(s): AMMONIA in the last 168 hours. CBC:  Recent Labs Lab 06/05/16 1740 06/06/16 0652 06/07/16 0350 06/08/16 0402 06/09/16 0258 06/10/16 0815  WBC 3.7* 6.0 5.8 4.9 4.2 4.6  NEUTROABS 2.5  --  4.7  --  3.0  --   HGB 7.3* 10.4* 10.1* 8.8* 7.9* 11.6*  HCT 22.6* 31.9* 30.7* 26.9* 23.9* 34.3*  MCV 96.2 88.9 88.5 89.1 88.2 87.3  PLT 170 108* 113* 100* 89* 96*   Cardiac Enzymes: No results for input(s): CKTOTAL, CKMB, CKMBINDEX, TROPONINI in the last 168 hours. BNP: Invalid input(s): POCBNP CBG:  Recent Labs Lab 06/10/16 1155 06/10/16 1604 06/10/16 1957 06/10/16 2354 06/11/16 0453  GLUCAP 198* 190* 180* 215* 106*   D-Dimer No results for input(s): DDIMER in the last 72 hours. Hgb A1c No results for input(s): HGBA1C in the last 72 hours. Lipid Profile No results for input(s): CHOL, HDL, LDLCALC, TRIG, CHOLHDL, LDLDIRECT in the last 72 hours. Thyroid function studies No results for input(s): TSH, T4TOTAL, T3FREE, THYROIDAB in the last 72 hours.  Invalid input(s): FREET3 Anemia work up No results for input(s): VITAMINB12, FOLATE, FERRITIN, TIBC, IRON, RETICCTPCT in the last 72 hours. Urinalysis    Component Value Date/Time   COLORURINE YELLOW 06/06/2016 0115   COLORURINE Yellow 05/04/2014 0914   APPEARANCEUR CLOUDY* 06/06/2016 0115   APPEARANCEUR Clear 05/04/2014 0914   LABSPEC 1.019 06/06/2016 0115   LABSPEC 1.010 05/04/2014 0914   LABSPEC 1.020 02/14/2011 1654   PHURINE 5.0 06/06/2016 0115   PHURINE 5.0 05/04/2014 0914   PHURINE 6.0 02/14/2011 1654   GLUCOSEU NEGATIVE 06/06/2016 0115   GLUCOSEU >=500 05/04/2014 0914   HGBUR NEGATIVE 06/06/2016 0115   HGBUR 1+ 05/04/2014 0914   HGBUR Negative 02/14/2011 1654   HGBUR negative 05/17/2009 1334   BILIRUBINUR NEGATIVE 06/06/2016 0115   BILIRUBINUR Negative 05/04/2014 0914   BILIRUBINUR Negative 02/14/2011 1654   KETONESUR NEGATIVE 06/06/2016 0115    KETONESUR Negative 05/04/2014 0914   KETONESUR Negative 02/14/2011 1654   PROTEINUR NEGATIVE 06/06/2016 0115   PROTEINUR Negative 05/04/2014 0914   PROTEINUR Negative 02/14/2011 1654   UROBILINOGEN 0.2 09/17/2009 0233   NITRITE NEGATIVE 06/06/2016 0115   NITRITE Negative 05/04/2014 0914   NITRITE Negative 02/14/2011 1654   LEUKOCYTESUR TRACE* 06/06/2016 0115   LEUKOCYTESUR 2+ 05/04/2014 0914   LEUKOCYTESUR Trace 02/14/2011 1654   Sepsis Labs Invalid input(s): PROCALCITONIN,  WBC,  LACTICIDVEN Microbiology Recent Results (from the past 240 hour(s))  Surgical pcr screen     Status: None   Collection Time: 06/06/16  2:00 AM  Result Value Ref Range Status   MRSA, PCR NEGATIVE NEGATIVE Final   Staphylococcus aureus NEGATIVE NEGATIVE Final    Comment:        The Xpert SA Assay (FDA approved for NASAL specimens in patients over 23 years of age), is one component of a comprehensive surveillance program.  Test performance has been validated by [facility] for patients greater than or equal to 106 year old. It is not intended to diagnose infection nor to guide or monitor treatment.    Time coordinating discharge: 34 minutes  SIGNED:  Irwin Brakeman, MD  [facility] 06/11/2016, 12:21 PM Pager   If 7PM-7AM, please contact night-coverage [email or web address] Password [email or web address]

## 2016-06-11 NOTE — Progress Notes (Signed)
Orthopaedic Trauma Service Progress Note  Subjective  Doing ok Still reports some R hip pain  Received 2 more units of PRBC's over weekend, felt better after transfusion  Agreeable to SNF   ROS As above   Objective   BP 153/72 mmHg  Pulse 84  Temp(Src) 100.5 F (38.1 C) (Oral)  Resp 18  Ht 5' 3" (1.6 m)  Wt 62.188 kg (137 lb 1.6 oz)  BMI 24.29 kg/m2  SpO2 98%  Intake/Output      06/11 0701 - 06/12 0700 06/12 0701 - 06/13 0700   P.O. 240    Blood     Total Intake(mL/kg) 240 (3.9)    Net +240          Urine Occurrence 3 x    Stool Occurrence 2 x      Labs  CBG (last 3)   Recent Labs  06/10/16 1957 06/10/16 2354 06/11/16 0453  GLUCAP 180* 215* 106*    Results for Bruhl, Tauri L (MRN [id number]) as of 06/11/2016 08:54  Ref. Range 06/10/2016 08:15  Sodium Latest Ref Range: 135-145 mmol/L 136  Potassium Latest Ref Range: 3.5-5.1 mmol/L 3.7  Chloride Latest Ref Range: 101-111 mmol/L 108  CO2 Latest Ref Range: 22-32 mmol/L 20 (L)  BUN Latest Ref Range: 6-20 mg/dL 30 (H)  Creatinine Latest Ref Range: 0.44-1.00 mg/dL 1.94 (H)  Calcium Latest Ref Range: 8.9-10.3 mg/dL 8.2 (L)  EGFR (Non-African Amer.) Latest Ref Range: >60 mL/min 26 (L)  EGFR (African American) Latest Ref Range: >60 mL/min 30 (L)  Glucose Latest Ref Range: 65-99 mg/dL 199 (H)  Anion gap Latest Ref Range: 5-15  8  WBC Latest Ref Range: 4.0-10.5 K/uL 4.6  RBC Latest Ref Range: 3.87-5.11 MIL/uL 3.93  Hemoglobin Latest Ref Range: 12.0-15.0 g/dL 11.6 (L)  HCT Latest Ref Range: 36.0-46.0 % 34.3 (L)  MCV Latest Ref Range: 78.0-100.0 fL 87.3  MCH Latest Ref Range: 26.0-34.0 pg 29.5  MCHC Latest Ref Range: 30.0-36.0 g/dL 33.8  RDW Latest Ref Range: 11.5-15.5 % 16.0 (H)  Platelets Latest Ref Range: 150-400 K/uL 96 (L)    Exam  Gen: resting comfortably in bed, NAD, eating breakfast  Lungs: clear anterior fields Cardiac: RRR Abd: + BS, NT Ext:       Right Lower Extremity   Dressing  removed  Incision looks fantastic  No erythema, drainage or other signs of infection   DPN, SPN, TN sensation intact   Ext warm  + DP pulse  No DCT   Compartments soft  EHL, FHL, AT, PT, peroneals, gastroc motor intact  No appreciable swelling distally    Assessment and Plan   POD/HD#:4  64 y/o female s/p fall with R femoral neck fracture s/p R hip hemiarthroplasty     - Femoral neck fracture and hemi-arthroplasty  WBAT  Posterior hip precautions  Continue PT/OT  SNF at dc   Dressing changed today, can change PRN  Ok to shower with dressing off   Clean wound with soap and water   - Pain management:             Minimize narcotic use for pain management. Use Tylenol as primary pain medication.              - ABL anemia/Hemodynamics             improved     - Medical issues               UTI - continue Levaquin   every 48 hours  Defer duration to medicine service               - DVT/PE prophylaxis:             Lovenox 14 days             ok to restart plavix tomorrow   -ID:             UTI - Klebsiella, continue levaquin  - Activity:             Weight bearing as tolerated               PT/OT  - FEN/GI prophylaxis/Foley/Lines:           dysphagia 3 diet    protonix   - Dispo:             stable for dc to snf from ortho standpoint   Follow up with ortho in 2 weeks   Jari Pigg, PA-C [facility] [phone] ([phone] (O) 06/11/2016 8:52 AM

## 2016-06-11 NOTE — Consult Note (Signed)
   [facility] CM Inpatient Consult   06/11/2016  [name] Apr 10, 1951 [id number]   Patient screened for potential [facility] Care Management services. Chart reviewed. 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 Noted discharge plan is for SNF.  There are no identifiable [facility] Care Management needs at this time. If patient's post hospital needs change, please place a [facility] Care Management consult. For questions please contact:  Marthenia Rolling, [place], RN,BSN [facility] Liaison [phone]

## 2016-06-11 NOTE — Progress Notes (Signed)
Report called to [facility] at [employer]. No questions at this time. Awaiting PTAR.

## 2016-06-11 NOTE — Clinical Social Work Placement (Signed)
   CLINICAL SOCIAL WORK PLACEMENT  NOTE  Date:  06/11/2016  Patient Details  Name: [name] MRN: [id number] Date of Birth: 18-May-1951  Clinical Social Work is seeking post-discharge placement for this patient at the [place] level of care (*CSW will initial, date and re-position this form in  chart as items are completed):  Yes   Patient/family provided with [place] Work Department's list of facilities offering this level of care within the geographic area requested by the patient (or if unable, by the patient's family).  Yes   Patient/family informed of their freedom to choose among providers that offer the needed level of care, that participate in Medicare, Medicaid or managed care program needed by the patient, have an available bed and are willing to accept the patient.  Yes   Patient/family informed of [place]'s ownership interest in [facility] and [facility], as well as of the fact that they are under no obligation to receive care at these facilities.  PASRR submitted to EDS on 06/09/16     PASRR number received on 06/09/16     Existing PASRR number confirmed on 06/09/16     FL2 transmitted to all facilities in geographic area requested by pt/family on 06/09/16     FL2 transmitted to all facilities within larger geographic area on       Patient informed that his/her managed care company has contracts with or will negotiate with certain facilities, including the following:        Yes   Patient/family informed of bed offers received.  Patient chooses bed at [facility]     Physician recommends and patient chooses bed at      Patient to be transferred to [facility] on 06/11/16.  Patient to be transferred to facility by PTAR     Patient family notified on 06/11/16 of transfer.  Name of family member notified:  Patient updated at bedside     PHYSICIAN Please prepare prescriptions, Please sign FL2      Additional Comment:  Per MD patient is ready to discharge to [facility] . RN and Neurosurgeon for [facility] notified of discharge. RN given phone number for report and transport packet is on patient's chart. Ambulance transport requested. CSW signing off.   _______________________________________________ Samule Dry, LCSW 06/11/2016, 3:20 PM

## 2016-06-11 NOTE — Progress Notes (Signed)
Attempted to call report to [facility]. No one answered after 10 minutes of being on hold. Will attempt again.

## 2016-06-11 NOTE — Progress Notes (Signed)
Patient discharged, awaiting PTAR. CCMD called and tele removed. Patient had no belongings at bedside, not even clothing. Patient discharged with hospital cloths. All meds given.

## 2016-06-11 NOTE — Clinical Social Work Placement (Signed)
   CLINICAL SOCIAL WORK PLACEMENT  NOTE  Date:  06/11/2016  Patient Details  Name: [name] MRN: [id number] Date of Birth: 04-20-1951  Clinical Social Work is seeking post-discharge placement for this patient at the [place] level of care (*CSW will initial, date and re-position this form in  chart as items are completed):  Yes   Patient/family provided with [place] Work Department's list of facilities offering this level of care within the geographic area requested by the patient (or if unable, by the patient's family).  Yes   Patient/family informed of their freedom to choose among providers that offer the needed level of care, that participate in Medicare, Medicaid or managed care program needed by the patient, have an available bed and are willing to accept the patient.  Yes   Patient/family informed of [place]'s ownership interest in [place] and [facility], as well as of the fact that they are under no obligation to receive care at these facilities.  PASRR submitted to EDS on 06/09/16     PASRR number received on 06/09/16     Existing PASRR number confirmed on 06/09/16     FL2 transmitted to all facilities in geographic area requested by pt/family on 06/09/16     FL2 transmitted to all facilities within larger geographic area on       Patient informed that his/her managed care company has contracts with or will negotiate with certain facilities, including the following:        Yes   Patient/family informed of bed offers received.  Patient chooses bed at [facility]     Physician recommends and patient chooses bed at      Patient to be transferred to [facility] on 06/11/16.  Patient to be transferred to facility by PTAR     Patient family notified on 06/11/16 of transfer.  Name of family member notified:  Patient updated at bedside     PHYSICIAN Please prepare prescriptions, Please sign FL2      Additional Comment:    _______________________________________________ Caroline Sauger, LCSW 06/11/2016, 10:21 AM

## 2016-06-11 NOTE — Progress Notes (Signed)
Physical Therapy Treatment Patient Details Name: [name] MRN: [id number] DOB: [birth year] Today's Date: 06/11/2016    History of Present Illness pt presents after multiple falls at home and sustained a R Femoral Neck fx now s/p Hemiarthroplasty.  pt with hx of falls, DM, Anemia, CAD, and CKD.      PT Comments    Pt making good progress with mobility today. She does demonstrate poor recall of hip precautions.  Con't to recommend short term SNF.  Follow Up Recommendations  SNF     Equipment Recommendations  None recommended by PT    Recommendations for Other Services       Precautions / Restrictions Precautions Precautions: Fall;Posterior Hip Precaution Comments: Reviewed precautions at beginnig of session, could not recall at end of session Restrictions Weight Bearing Restrictions: No RLE Weight Bearing: Weight bearing as tolerated    Mobility  Bed Mobility Overal bed mobility: Needs Assistance Bed Mobility: Supine to Sit     Supine to sit: Min assist     General bed mobility comments: Pt doing a good job of moving R LE to EOB, but needed MIN A to get fully upright and to maintain hip precautions  Transfers Overall transfer level: Needs assistance Equipment used: Rolling walker (2 wheeled) Transfers: Sit to/from Stand Sit to Stand: Min assist;+2 safety/equipment         General transfer comment: verbal cues for sequencing and precautions  Ambulation/Gait Ambulation/Gait assistance: Min assist;+2 safety/equipment Ambulation Distance (Feet): 25 Feet Assistive device: Rolling walker (2 wheeled) Gait Pattern/deviations: Step-to pattern;Trunk flexed;Decreased step length - right;Decreased step length - left Gait velocity: decreased Gait velocity interpretation: Below normal speed for age/gender General Gait Details: Cues for proper sequencing and some A to push RW farther.  Pt began to have loose stool and BSC brought to her. When finished transfered to  recliner.   Stairs            Wheelchair Mobility    Modified Rankin (Stroke Patients Only)       Balance                                    Cognition Arousal/Alertness: Awake/alert Behavior During Therapy: Anxious Overall Cognitive Status: Within Functional Limits for tasks assessed       Memory: Decreased recall of precautions;Decreased short-term memory   Safety/Judgement: Decreased awareness of safety;Decreased awareness of deficits   Problem Solving: Requires verbal cues;Requires tactile cues General Comments: no family present    Exercises Total Joint Exercises Heel Slides: AAROM;Right;10 reps;Supine    General Comments General comments (skin integrity, edema, etc.): Pt nervous about going to SNF      Pertinent Vitals/Pain Pain Assessment: Faces Faces Pain Scale: Hurts little more Pain Location: R leg Pain Descriptors / Indicators: Grimacing Pain Intervention(s): Premedicated before session    Home Living Family/patient expects to be discharged to:: Skilled nursing facility Living Arrangements: Spouse/significant other                  Prior Function            PT Goals (current goals can now be found in the care plan section) Acute Rehab PT Goals Patient Stated Goal: home PT Goal Formulation: With patient/family Time For Goal Achievement: 06/22/16 Potential to Achieve Goals: Good Progress towards PT goals: Progressing toward goals    Frequency  Min 3X/week    PT Plan Current  plan remains appropriate    Co-evaluation             End of Session Equipment Utilized During Treatment: Gait belt Activity Tolerance: Patient tolerated treatment well Patient left: in chair;with call bell/phone within reach;with chair alarm set     Time: [phone] (no charge for time using toilet) PT Time Calculation (min) (ACUTE ONLY): 24 min  Charges:  $Gait Training: 8-22 mins                    G Codes:      Keeon Zurn  LUBECK 06/11/2016, 10:38 AM

## 2016-06-11 NOTE — Discharge Instructions (Signed)
Orthopaedic Trauma Service Discharge Instructions   General Discharge Instructions  WEIGHT BEARING STATUS: Weightbearing as tolerated   RANGE OF MOTION/ACTIVITY: posterior hip precautions right hip   Wound Care: daily dressing changes as needed. Ok to shower once wound dry  Do not apply ointments or lotions to wound. Clean wound with soap and water only  See additional instructions below   PAIN MEDICATION USE AND EXPECTATIONS  You have likely been given narcotic medications to help control your pain.  After a traumatic event that results in an fracture (broken bone) with or without surgery, it is ok to use narcotic pain medications to help control one's pain.  We understand that everyone responds to pain differently and each individual patient will be evaluated on a regular basis for the continued need for narcotic medications. Ideally, narcotic medication use should last no more than 6-8 weeks (coinciding with fracture healing).   As a patient it is your responsibility as well to monitor narcotic medication use and report the amount and frequency you use these medications when you come to your office visit.   We would also advise that if you are using narcotic medications, you should take a dose prior to therapy to maximize you participation.  IF YOU ARE ON NARCOTIC MEDICATIONS IT IS NOT PERMISSIBLE TO OPERATE A MOTOR VEHICLE (MOTORCYCLE/CAR/TRUCK/MOPED) OR HEAVY MACHINERY DO NOT MIX NARCOTICS WITH OTHER CNS (CENTRAL NERVOUS SYSTEM) DEPRESSANTS SUCH AS ALCOHOL  Diet: as you were eating previously.  Can use over the counter stool softeners and bowel preparations, such as Miralax, to help with bowel movements.  Narcotics can be constipating.  Be sure to drink plenty of fluids    STOP SMOKING OR USING NICOTINE PRODUCTS!!!!  As discussed nicotine severely impairs your body's ability to heal surgical and traumatic wounds but also impairs bone healing.  Wounds and bone heal by forming  microscopic blood vessels (angiogenesis) and nicotine is a vasoconstrictor (essentially, shrinks blood vessels).  Therefore, if vasoconstriction occurs to these microscopic blood vessels they essentially disappear and are unable to deliver necessary nutrients to the healing tissue.  This is one modifiable factor that you can do to dramatically increase your chances of healing your injury.    (This means no smoking, no nicotine gum, patches, etc)  DO NOT USE NONSTEROIDAL ANTI-INFLAMMATORY DRUGS (NSAID'S)  Using products such as Advil (ibuprofen), Aleve (naproxen), Motrin (ibuprofen) for additional pain control during fracture healing can delay and/or prevent the healing response.  If you would like to take over the counter (OTC) medication, Tylenol (acetaminophen) is ok.  However, some narcotic medications that are given for pain control contain acetaminophen as well. Therefore, you should not exceed more than 4000 mg of tylenol in a day if you do not have liver disease.  Also note that there are may OTC medicines, such as cold medicines and allergy medicines that my contain tylenol as well.  If you have any questions about medications and/or interactions please ask your doctor/PA or your pharmacist.      ICE AND ELEVATE INJURED/OPERATIVE EXTREMITY  Using ice and elevating the injured extremity above your heart can help with swelling and pain control.  Icing in a pulsatile fashion, such as 20 minutes on and 20 minutes off, can be followed.    Do not place ice directly on skin. Make sure there is a barrier between to skin and the ice pack.    Using frozen items such as frozen peas works well as the conform nicely to  the are that needs to be iced.  USE AN ACE WRAP OR TED HOSE FOR SWELLING CONTROL  In addition to icing and elevation, Ace wraps or TED hose are used to help limit and resolve swelling.  It is recommended to use Ace wraps or TED hose until you are informed to stop.    When using Ace Wraps  start the wrapping distally (farthest away from the body) and wrap proximally (closer to the body)   Example: If you had surgery on your leg or thing and you do not have a splint on, start the ace wrap at the toes and work your way up to the thigh        If you had surgery on your upper extremity and do not have a splint on, start the ace wrap at your fingers and work your way up to the upper arm  IF YOU ARE IN A SPLINT OR CAST DO NOT [place]   If your splint gets wet for any reason please contact the office immediately. You may shower in your splint or cast as long as you keep it dry.  This can be done by wrapping in a cast cover or garbage back (or similar)  Do Not stick any thing down your splint or cast such as pencils, money, or hangers to try and scratch yourself with.  If you feel itchy take benadryl as prescribed on the bottle for itching  IF YOU ARE IN A CAM BOOT (BLACK BOOT)  You may remove boot periodically. Perform daily dressing changes as noted below.  Wash the liner of the boot regularly and wear a sock when wearing the boot. It is recommended that you sleep in the boot until told otherwise  CALL THE OFFICE WITH ANY QUESTIONS OR CONCERNS: [phone]        Discharge Pin Site Instructions  Dress pins daily with Kerlix roll starting on POD 2. Wrap the Kerlix so that it tamps the skin down around the pin-skin interface to prevent/limit motion of the skin relative to the pin.  (Pin-skin motion is the primary cause of pain and infection related to external fixator pin sites).  Remove any crust or coagulum that may obstruct drainage with a saline moistened gauze or soap and water.  After POD 3, if there is no discernable drainage on the pin site dressing, the interval for change can by increased to every other day.  You may shower with the fixator, cleaning all pin sites gently with soap and water.  If you have a surgical wound this needs to be completely dry and  without drainage before showering.  The extremity can be lifted by the fixator to facilitate wound care and transfers.  Notify the office/Doctor if you experience increasing drainage, redness, or pain from a pin site, or if you notice purulent (thick, snot-like) drainage.  Discharge Wound Care Instructions  Do NOT apply any ointments, solutions or lotions to pin sites or surgical wounds.  These prevent needed drainage and even though solutions like hydrogen peroxide kill bacteria, they also damage cells lining the pin sites that help fight infection.  Applying lotions or ointments can keep the wounds moist and can cause them to breakdown and open up as well. This can increase the risk for infection. When in doubt call the office.  Surgical incisions should be dressed daily.  If any drainage is noted, use one layer of adaptic, then gauze, Kerlix, and an ace wrap.  Once the incision is completely dry and without drainage, it may be left open to air out.  Showering may begin 36-48 hours later.  Cleaning gently with soap and water.  Traumatic wounds should be dressed daily as well.    One layer of adaptic, gauze, Kerlix, then ace wrap.  The adaptic can be discontinued once the draining has ceased    If you have a wet to dry dressing: wet the gauze with saline the squeeze as much saline out so the gauze is moist (not soaking wet), place moistened gauze over wound, then place a dry gauze over the moist one, followed by Kerlix wrap, then ace wrap.

## 2016-06-11 NOTE — Care Management Important Message (Signed)
Important Message  Patient Details  Name: [name] MRN: [id number] Date of Birth: April 09, 1951   Medicare Important Message Given:  Yes    Loann Quill 06/11/2016, 1:56 PM

## 2016-06-13 ENCOUNTER — Emergency Department (HOSPITAL_COMMUNITY): Payer: Commercial Managed Care - HMO

## 2016-06-13 ENCOUNTER — Inpatient Hospital Stay (HOSPITAL_COMMUNITY)
Admission: EM | Admit: 2016-06-13 | Discharge: 2016-07-04 | DRG: 871 | Disposition: A | Discharge status: Skilled Nursing Facility | Payer: Commercial Managed Care - HMO | Attending: Internal Medicine | Admitting: Internal Medicine

## 2016-06-13 ENCOUNTER — Encounter: Payer: Self-pay | Admitting: Family

## 2016-06-13 ENCOUNTER — Encounter (HOSPITAL_COMMUNITY): Payer: Self-pay | Admitting: *Deleted

## 2016-06-13 DIAGNOSIS — S72001D Fracture of unspecified part of neck of right femur, subsequent encounter for closed fracture with routine healing: Secondary | ICD-10-CM

## 2016-06-13 DIAGNOSIS — L89152 Pressure ulcer of sacral region, stage 2: Secondary | ICD-10-CM | POA: Diagnosis present

## 2016-06-13 DIAGNOSIS — I252 Old myocardial infarction: Secondary | ICD-10-CM

## 2016-06-13 DIAGNOSIS — R29898 Other symptoms and signs involving the musculoskeletal system: Secondary | ICD-10-CM | POA: Diagnosis not present

## 2016-06-13 DIAGNOSIS — J9600 Acute respiratory failure, unspecified whether with hypoxia or hypercapnia: Secondary | ICD-10-CM | POA: Diagnosis not present

## 2016-06-13 DIAGNOSIS — Z888 Allergy status to other drugs, medicaments and biological substances status: Secondary | ICD-10-CM

## 2016-06-13 DIAGNOSIS — E785 Hyperlipidemia, unspecified: Secondary | ICD-10-CM | POA: Diagnosis present

## 2016-06-13 DIAGNOSIS — Z955 Presence of coronary angioplasty implant and graft: Secondary | ICD-10-CM

## 2016-06-13 DIAGNOSIS — Z8673 Personal history of transient ischemic attack (TIA), and cerebral infarction without residual deficits: Secondary | ICD-10-CM | POA: Diagnosis not present

## 2016-06-13 DIAGNOSIS — R6521 Severe sepsis with septic shock: Secondary | ICD-10-CM | POA: Diagnosis present

## 2016-06-13 DIAGNOSIS — D696 Thrombocytopenia, unspecified: Secondary | ICD-10-CM | POA: Diagnosis present

## 2016-06-13 DIAGNOSIS — F329 Major depressive disorder, single episode, unspecified: Secondary | ICD-10-CM | POA: Diagnosis present

## 2016-06-13 DIAGNOSIS — N19 Unspecified kidney failure: Secondary | ICD-10-CM

## 2016-06-13 DIAGNOSIS — Z681 Body mass index (BMI) 19 or less, adult: Secondary | ICD-10-CM | POA: Diagnosis not present

## 2016-06-13 DIAGNOSIS — J9601 Acute respiratory failure with hypoxia: Secondary | ICD-10-CM

## 2016-06-13 DIAGNOSIS — Z794 Long term (current) use of insulin: Secondary | ICD-10-CM | POA: Diagnosis not present

## 2016-06-13 DIAGNOSIS — Z882 Allergy status to sulfonamides status: Secondary | ICD-10-CM

## 2016-06-13 DIAGNOSIS — E440 Moderate protein-calorie malnutrition: Secondary | ICD-10-CM | POA: Diagnosis present

## 2016-06-13 DIAGNOSIS — E1122 Type 2 diabetes mellitus with diabetic chronic kidney disease: Secondary | ICD-10-CM | POA: Diagnosis present

## 2016-06-13 DIAGNOSIS — J189 Pneumonia, unspecified organism: Secondary | ICD-10-CM

## 2016-06-13 DIAGNOSIS — E861 Hypovolemia: Secondary | ICD-10-CM | POA: Diagnosis present

## 2016-06-13 DIAGNOSIS — L8990 Pressure ulcer of unspecified site, unspecified stage: Secondary | ICD-10-CM | POA: Insufficient documentation

## 2016-06-13 DIAGNOSIS — E46 Unspecified protein-calorie malnutrition: Secondary | ICD-10-CM | POA: Insufficient documentation

## 2016-06-13 DIAGNOSIS — Z7901 Long term (current) use of anticoagulants: Secondary | ICD-10-CM | POA: Diagnosis not present

## 2016-06-13 DIAGNOSIS — Z4659 Encounter for fitting and adjustment of other gastrointestinal appliance and device: Secondary | ICD-10-CM

## 2016-06-13 DIAGNOSIS — I2510 Atherosclerotic heart disease of native coronary artery without angina pectoris: Secondary | ICD-10-CM | POA: Diagnosis present

## 2016-06-13 DIAGNOSIS — A419 Sepsis, unspecified organism: Secondary | ICD-10-CM | POA: Diagnosis present

## 2016-06-13 DIAGNOSIS — R7989 Other specified abnormal findings of blood chemistry: Secondary | ICD-10-CM

## 2016-06-13 DIAGNOSIS — E875 Hyperkalemia: Secondary | ICD-10-CM | POA: Diagnosis not present

## 2016-06-13 DIAGNOSIS — R4182 Altered mental status, unspecified: Secondary | ICD-10-CM | POA: Diagnosis present

## 2016-06-13 DIAGNOSIS — G92 Toxic encephalopathy: Secondary | ICD-10-CM | POA: Diagnosis present

## 2016-06-13 DIAGNOSIS — Z87891 Personal history of nicotine dependence: Secondary | ICD-10-CM | POA: Diagnosis not present

## 2016-06-13 DIAGNOSIS — Y95 Nosocomial condition: Secondary | ICD-10-CM | POA: Diagnosis present

## 2016-06-13 DIAGNOSIS — D638 Anemia in other chronic diseases classified elsewhere: Secondary | ICD-10-CM | POA: Diagnosis present

## 2016-06-13 DIAGNOSIS — N170 Acute kidney failure with tubular necrosis: Secondary | ICD-10-CM | POA: Diagnosis present

## 2016-06-13 DIAGNOSIS — Z9889 Other specified postprocedural states: Secondary | ICD-10-CM

## 2016-06-13 DIAGNOSIS — N184 Chronic kidney disease, stage 4 (severe): Secondary | ICD-10-CM | POA: Diagnosis present

## 2016-06-13 DIAGNOSIS — R680 Hypothermia, not associated with low environmental temperature: Secondary | ICD-10-CM | POA: Diagnosis present

## 2016-06-13 DIAGNOSIS — K859 Acute pancreatitis without necrosis or infection, unspecified: Secondary | ICD-10-CM | POA: Diagnosis present

## 2016-06-13 DIAGNOSIS — Z7902 Long term (current) use of antithrombotics/antiplatelets: Secondary | ICD-10-CM

## 2016-06-13 DIAGNOSIS — F419 Anxiety disorder, unspecified: Secondary | ICD-10-CM | POA: Diagnosis present

## 2016-06-13 DIAGNOSIS — K219 Gastro-esophageal reflux disease without esophagitis: Secondary | ICD-10-CM | POA: Diagnosis present

## 2016-06-13 DIAGNOSIS — L89150 Pressure ulcer of sacral region, unstageable: Secondary | ICD-10-CM | POA: Diagnosis not present

## 2016-06-13 DIAGNOSIS — I248 Other forms of acute ischemic heart disease: Secondary | ICD-10-CM | POA: Diagnosis present

## 2016-06-13 DIAGNOSIS — R109 Unspecified abdominal pain: Secondary | ICD-10-CM

## 2016-06-13 DIAGNOSIS — N179 Acute kidney failure, unspecified: Secondary | ICD-10-CM

## 2016-06-13 DIAGNOSIS — E1149 Type 2 diabetes mellitus with other diabetic neurological complication: Secondary | ICD-10-CM | POA: Diagnosis present

## 2016-06-13 DIAGNOSIS — L309 Dermatitis, unspecified: Secondary | ICD-10-CM | POA: Diagnosis present

## 2016-06-13 DIAGNOSIS — R7309 Other abnormal glucose: Secondary | ICD-10-CM | POA: Insufficient documentation

## 2016-06-13 DIAGNOSIS — R64 Cachexia: Secondary | ICD-10-CM | POA: Diagnosis present

## 2016-06-13 DIAGNOSIS — J810 Acute pulmonary edema: Secondary | ICD-10-CM | POA: Diagnosis present

## 2016-06-13 DIAGNOSIS — E1110 Type 2 diabetes mellitus with ketoacidosis without coma: Secondary | ICD-10-CM

## 2016-06-13 DIAGNOSIS — I25119 Atherosclerotic heart disease of native coronary artery with unspecified angina pectoris: Secondary | ICD-10-CM | POA: Diagnosis not present

## 2016-06-13 DIAGNOSIS — E1310 Other specified diabetes mellitus with ketoacidosis without coma: Secondary | ICD-10-CM | POA: Diagnosis present

## 2016-06-13 DIAGNOSIS — Z880 Allergy status to penicillin: Secondary | ICD-10-CM

## 2016-06-13 DIAGNOSIS — I129 Hypertensive chronic kidney disease with stage 1 through stage 4 chronic kidney disease, or unspecified chronic kidney disease: Secondary | ICD-10-CM | POA: Diagnosis present

## 2016-06-13 DIAGNOSIS — D472 Monoclonal gammopathy: Secondary | ICD-10-CM | POA: Diagnosis present

## 2016-06-13 DIAGNOSIS — E872 Acidosis, unspecified: Secondary | ICD-10-CM | POA: Diagnosis present

## 2016-06-13 DIAGNOSIS — I340 Nonrheumatic mitral (valve) insufficiency: Secondary | ICD-10-CM | POA: Diagnosis not present

## 2016-06-13 DIAGNOSIS — D72829 Elevated white blood cell count, unspecified: Secondary | ICD-10-CM | POA: Insufficient documentation

## 2016-06-13 DIAGNOSIS — F13239 Sedative, hypnotic or anxiolytic dependence with withdrawal, unspecified: Secondary | ICD-10-CM | POA: Diagnosis present

## 2016-06-13 DIAGNOSIS — E0810 Diabetes mellitus due to underlying condition with ketoacidosis without coma: Secondary | ICD-10-CM | POA: Diagnosis not present

## 2016-06-13 DIAGNOSIS — N189 Chronic kidney disease, unspecified: Secondary | ICD-10-CM

## 2016-06-13 DIAGNOSIS — G9340 Encephalopathy, unspecified: Secondary | ICD-10-CM | POA: Diagnosis not present

## 2016-06-13 DIAGNOSIS — Z885 Allergy status to narcotic agent status: Secondary | ICD-10-CM

## 2016-06-13 DIAGNOSIS — E11649 Type 2 diabetes mellitus with hypoglycemia without coma: Secondary | ICD-10-CM | POA: Diagnosis present

## 2016-06-13 DIAGNOSIS — Z96649 Presence of unspecified artificial hip joint: Secondary | ICD-10-CM | POA: Insufficient documentation

## 2016-06-13 DIAGNOSIS — E876 Hypokalemia: Secondary | ICD-10-CM | POA: Diagnosis not present

## 2016-06-13 HISTORY — DX: Cutaneous abscess, unspecified: L02.91

## 2016-06-13 HISTORY — DX: Cellulitis, unspecified: L03.90

## 2016-06-13 LAB — URINALYSIS, ROUTINE W REFLEX MICROSCOPIC
Glucose, UA: 250 mg/dL — AB
KETONES UR: NEGATIVE mg/dL
NITRITE: NEGATIVE
PH: 5 (ref 5.0–8.0)
Protein, ur: NEGATIVE mg/dL
Specific Gravity, Urine: 1.021 (ref 1.005–1.030)

## 2016-06-13 LAB — BASIC METABOLIC PANEL
Anion gap: 13 (ref 5–15)
BUN: 66 mg/dL — AB (ref 6–20)
CHLORIDE: 105 mmol/L (ref 101–111)
CO2: 13 mmol/L — AB (ref 22–32)
Calcium: 7.6 mg/dL — ABNORMAL LOW (ref 8.9–10.3)
Creatinine, Ser: 3.24 mg/dL — ABNORMAL HIGH (ref 0.44–1.00)
GFR calc Af Amer: 16 mL/min — ABNORMAL LOW (ref >60)
GFR calc non Af Amer: 14 mL/min — ABNORMAL LOW (ref >60)
GLUCOSE: 671 mg/dL — AB (ref 65–99)
POTASSIUM: 3.7 mmol/L (ref 3.5–5.1)
SODIUM: 131 mmol/L — AB (ref 135–145)

## 2016-06-13 LAB — URINE MICROSCOPIC-ADD ON

## 2016-06-13 LAB — BETA-HYDROXYBUTYRIC ACID: BETA-HYDROXYBUTYRIC ACID: 2.57 mmol/L — AB (ref 0.05–0.27)

## 2016-06-13 LAB — CBC WITH DIFFERENTIAL/PLATELET
Basophils Absolute: 0 10*3/uL (ref 0.0–0.1)
Basophils Relative: 0 %
EOS PCT: 0 %
Eosinophils Absolute: 0 10*3/uL (ref 0.0–0.7)
HEMATOCRIT: 31.2 % — AB (ref 36.0–46.0)
HEMOGLOBIN: 9.7 g/dL — AB (ref 12.0–15.0)
LYMPHS ABS: 1 10*3/uL (ref 0.7–4.0)
LYMPHS PCT: 11 %
MCH: 29.5 pg (ref 26.0–34.0)
MCHC: 31.1 g/dL (ref 30.0–36.0)
MCV: 94.8 fL (ref 78.0–100.0)
Monocytes Absolute: 0.7 10*3/uL (ref 0.1–1.0)
Monocytes Relative: 7 %
NEUTROS ABS: 7.9 10*3/uL — AB (ref 1.7–7.7)
NEUTROS PCT: 82 %
PLATELETS: 149 10*3/uL — AB (ref 150–400)
RBC: 3.29 MIL/uL — AB (ref 3.87–5.11)
RDW: 16.8 % — ABNORMAL HIGH (ref 11.5–15.5)
WBC: 9.4 10*3/uL (ref 4.0–10.5)

## 2016-06-13 LAB — I-STAT VENOUS BLOOD GAS, ED
ACID-BASE DEFICIT: 16 mmol/L — AB (ref 0.0–2.0)
Bicarbonate: 9.8 mEq/L — ABNORMAL LOW (ref 20.0–24.0)
O2 SAT: 97 %
PCO2 VEN: 23.4 mmHg — AB (ref 45.0–50.0)
TCO2: 11 mmol/L (ref 0–100)
pH, Ven: 7.23 — ABNORMAL LOW (ref 7.250–7.300)
pO2, Ven: 107 mmHg — ABNORMAL HIGH (ref 31.0–45.0)

## 2016-06-13 LAB — LIPASE, BLOOD: LIPASE: 614 U/L — AB (ref 11–51)

## 2016-06-13 LAB — I-STAT CG4 LACTIC ACID, ED
Lactic Acid, Venous: 1.99 mmol/L (ref 0.5–2.0)
Lactic Acid, Venous: 1.39 mmol/L (ref 0.5–2.0)

## 2016-06-13 LAB — COMPREHENSIVE METABOLIC PANEL
ALBUMIN: 2 g/dL — AB (ref 3.5–5.0)
ALT: 17 U/L (ref 14–54)
ANION GAP: 20 — AB (ref 5–15)
AST: 44 U/L — AB (ref 15–41)
Alkaline Phosphatase: 78 U/L (ref 38–126)
BUN: 72 mg/dL — AB (ref 6–20)
CHLORIDE: 99 mmol/L — AB (ref 101–111)
CO2: 10 mmol/L — ABNORMAL LOW (ref 22–32)
Calcium: 8.3 mg/dL — ABNORMAL LOW (ref 8.9–10.3)
Creatinine, Ser: 3.82 mg/dL — ABNORMAL HIGH (ref 0.44–1.00)
GFR calc Af Amer: 13 mL/min — ABNORMAL LOW (ref >60)
GFR, EST NON AFRICAN AMERICAN: 11 mL/min — AB (ref >60)
Glucose, Bld: 743 mg/dL (ref 65–99)
POTASSIUM: 4.4 mmol/L (ref 3.5–5.1)
Sodium: 129 mmol/L — ABNORMAL LOW (ref 135–145)
Total Bilirubin: 2.3 mg/dL — ABNORMAL HIGH (ref 0.3–1.2)
Total Protein: 4.5 g/dL — ABNORMAL LOW (ref 6.5–8.1)

## 2016-06-13 LAB — PROCALCITONIN: PROCALCITONIN: 2.55 ng/mL

## 2016-06-13 LAB — TROPONIN I: Troponin I: 9.95 ng/mL (ref <0.031)

## 2016-06-13 LAB — CBG MONITORING, ED
GLUCOSE-CAPILLARY: 533 mg/dL — AB (ref 65–99)
Glucose-Capillary: >600 mg/dL (ref 65–99)
Glucose-Capillary: 578 mg/dL (ref 65–99)

## 2016-06-13 LAB — MAGNESIUM: MAGNESIUM: 1.7 mg/dL (ref 1.7–2.4)

## 2016-06-13 LAB — PHOSPHORUS: Phosphorus: 5.5 mg/dL — ABNORMAL HIGH (ref 2.5–4.6)

## 2016-06-13 MED ORDER — SODIUM CHLORIDE 0.9 % IV SOLN
INTRAVENOUS | Status: DC
  Administered 2016-06-13: 5.4 [IU]/h via INTRAVENOUS
  Administered 2016-06-14: 18.6 [IU]/h via INTRAVENOUS
  Administered 2016-06-14: 21.7 [IU]/h via INTRAVENOUS
  Filled 2016-06-13 (×2): qty 2.5

## 2016-06-13 MED ORDER — VANCOMYCIN HCL 500 MG IV SOLR
500.0000 mg | INTRAVENOUS | Status: DC
  Administered 2016-06-14 – 2016-06-16 (×3): 500 mg via INTRAVENOUS
  Filled 2016-06-13 (×4): qty 500

## 2016-06-13 MED ORDER — DEXTROSE 5 % IV SOLN: 2.0000 g | Freq: Once | INTRAVENOUS | Status: DC

## 2016-06-13 MED ORDER — SODIUM CHLORIDE 0.9 % IV BOLUS (SEPSIS)
1000.0000 mL | Freq: Once | INTRAVENOUS | Status: AC
Start: 2016-06-13 — End: 2016-06-13
  Administered 2016-06-13: 1000 mL via INTRAVENOUS

## 2016-06-13 MED ORDER — SODIUM CHLORIDE 0.9 % IV SOLN
INTRAVENOUS | Status: DC
  Administered 2016-06-13 – 2016-06-14 (×2): via INTRAVENOUS

## 2016-06-13 MED ORDER — NOREPINEPHRINE BITARTRATE 1 MG/ML IV SOLN: 2.0000 ug/kg/min | Freq: Once | INTRAVENOUS | Status: DC

## 2016-06-13 MED ORDER — VANCOMYCIN HCL IN DEXTROSE 1-5 GM/200ML-% IV SOLN: 1000.0000 mg | Freq: Once | INTRAVENOUS | Status: DC

## 2016-06-13 MED ORDER — LEVOFLOXACIN IN D5W 750 MG/150ML IV SOLN: 750.0000 mg | Freq: Once | INTRAVENOUS | Status: DC

## 2016-06-13 MED ORDER — DEXTROSE 5 % IV SOLN
2.0000 g | INTRAVENOUS | Status: DC
  Administered 2016-06-13 – 2016-06-17 (×5): 2 g via INTRAVENOUS
  Filled 2016-06-13 (×6): qty 2

## 2016-06-13 MED ORDER — HEPARIN SODIUM (PORCINE) 5000 UNIT/ML IJ SOLN
5000.0000 [IU] | Freq: Three times a day (TID) | INTRAMUSCULAR | Status: DC
  Administered 2016-06-14 – 2016-06-15 (×5): 5000 [IU] via SUBCUTANEOUS
  Filled 2016-06-13 (×4): qty 1

## 2016-06-13 MED ORDER — INSULIN ASPART 100 UNIT/ML ~~LOC~~ SOLN
5.0000 [IU] | Freq: Once | SUBCUTANEOUS | Status: AC
  Administered 2016-06-13: 5 [IU] via INTRAVENOUS
  Filled 2016-06-13: qty 1

## 2016-06-13 MED ORDER — NOREPINEPHRINE BITARTRATE 1 MG/ML IV SOLN
0.0000 ug/min | INTRAVENOUS | Status: DC
  Administered 2016-06-13: 2 ug/min via INTRAVENOUS
  Filled 2016-06-13: qty 4

## 2016-06-13 MED ORDER — VANCOMYCIN HCL IN DEXTROSE 1-5 GM/200ML-% IV SOLN
1000.0000 mg | Freq: Once | INTRAVENOUS | Status: AC
  Administered 2016-06-13: 1000 mg via INTRAVENOUS
  Filled 2016-06-13: qty 200

## 2016-06-13 MED ORDER — DEXTROSE-NACL 5-0.45 % IV SOLN
INTRAVENOUS | Status: DC
  Administered 2016-06-14 (×2): via INTRAVENOUS

## 2016-06-13 MED ORDER — SODIUM CHLORIDE 0.9 % IV BOLUS (SEPSIS)
1000.0000 mL | Freq: Once | INTRAVENOUS | Status: AC
  Administered 2016-06-13: 1000 mL via INTRAVENOUS

## 2016-06-13 MED ORDER — SODIUM CHLORIDE 0.9 % IV BOLUS (SEPSIS)
500.0000 mL | Freq: Once | INTRAVENOUS | Status: AC
  Administered 2016-06-13: 500 mL via INTRAVENOUS

## 2016-06-13 MED ORDER — SODIUM CHLORIDE 0.9 % IV BOLUS (SEPSIS)
250.0000 mL | Freq: Once | INTRAVENOUS | Status: AC
  Administered 2016-06-13: 250 mL via INTRAVENOUS

## 2016-06-13 MED ORDER — POTASSIUM CHLORIDE 10 MEQ/100ML IV SOLN
10.0000 meq | INTRAVENOUS | Status: AC
  Administered 2016-06-13 – 2016-06-14 (×2): 10 meq via INTRAVENOUS
  Filled 2016-06-13 (×2): qty 100

## 2016-06-13 NOTE — ED Notes (Signed)
MD aware of high CBG, also aware unable to obtain 2nd IV.  MD to attempt.

## 2016-06-13 NOTE — ED Provider Notes (Signed)
CSN: [id number]     Arrival date & time 06/13/16  1556 History   First MD Initiated Contact with Patient 06/13/16 1558     Chief Complaint  Patient presents with  . Altered Mental Status     (Consider location/radiation/quality/duration/timing/severity/associated sxs/prior Treatment) HPI Comments: 65 year old female with past medical history including type 2 diabetes mellitus, hypertension, CAD, CKD, recent hip fracture s/p repair who p/w AMS. History obtained from EMS and the patient's records including primary care provider's note from earlier today. The patient was discharged on 6/12 after having hip surgery to repair a fracture from fall. Today, she was noted to have increased lethargy, decreased PO intake, and altered mentation according to staff. Patient disoriented to time. The patient complains of moderate abdominal pain which is worse with palpation. Provider note from earlier today noted normal temperature and BP but patient generally weak and altered, therefore she was sent here for further evaluation. Discharge note from 6/12 notes that the patient was diagnosed with UTI and treated with levofloxacin.  LEVEL 5 CAVEAT DUE TO AMS    Patient is a 65 y.o. female presenting with altered mental status. The history is provided by the EMS personnel and medical records.  Altered Mental Status   Past Medical History  Diagnosis Date  . DIABETES MELLITUS, II, COMPLICATIONS 99991111  . HYPERLIPIDEMIA 02/27/2007  . 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 ANEMIA, OTHER, UNSPECIFIED 02/27/2007  . DEPRESSIVE DISORDER, NOS 02/27/2007  . HYPERTENSION, BENIGN SYSTEMIC 02/27/2007  . CORONARY, ARTERIOSCLEROSIS 02/27/2007  . TIA 05/17/2009  . GASTROESOPHAGEAL REFLUX, NO ESOPHAGITIS 02/27/2007  . RENAL INSUFFICIENCY, CHRONIC 12/28/2009  . Renal failure, acute on chronic (HCC) 01/25/2014  . Blood transfusion without reported diagnosis   . Anemia    Past Surgical History  Procedure Laterality Date  .  Partial hysterectomy    . Rectocele repair    . Bladder surgery      Tack  . Hip arthroplasty Right 06/07/2016    Procedure: ARTHROPLASTY BIPOLAR HIP (HEMIARTHROPLASTY);  Surgeon: Altamese [place], MD;  Location: [facility];  Service: Orthopedics;  Laterality: Right;   Family History  Problem Relation Age of Onset  . Cancer Mother     unknown  . CAD Mother   . Cancer Sister     unknown cancer ?breast ca   Social History  Substance Use Topics  . Smoking status: Never Smoker   . Smokeless tobacco: Never Used  . Alcohol Use: No   OB History    No data available     Review of Systems  Unable to perform ROS: Mental status change      Allergies  Hydralazine hcl; Bisphosphonates; Codeine phosphate; Penicillins; and Sulfa antibiotics  Home Medications   Prior to Admission medications   Medication Sig Start Date End Date Taking? Authorizing Provider  acetaminophen (TYLENOL) 325 MG tablet Take 2 tablets (650 mg total) by mouth every 6 (six) hours as needed for mild pain (or Fever >/= 101). 06/08/16   Ainsley Spinner, PA-C  ALPRAZolam Duanne Moron) 0.5 MG tablet Take 1 tablet (0.5 mg total) by mouth 3 (three) times daily as needed for anxiety. 06/11/16   Clanford Marisa Hua, MD  amLODipine (NORVASC) 5 MG tablet Take 1 tablet (5 mg total) by mouth daily. 06/11/16   Clanford Marisa Hua, MD  carvedilol (COREG) 3.125 MG tablet Take 1 tablet (3.125 mg total) by mouth 2 (two) times daily with a meal. 02/10/16   Bettey Costa, MD  Cholecalciferol (VITAMIN D3) 5000 units TABS Take  5,000 Units by mouth daily.    Historical Provider, MD  Choline Fenofibrate (FENOFIBRIC ACID) 135 MG CPDR Take 135 mg by mouth at bedtime.     Historical Provider, MD  clopidogrel (PLAVIX) 75 MG tablet Take 75 mg by mouth at bedtime.     Historical Provider, MD  diphenoxylate-atropine (LOMOTIL) 2.5-0.025 MG/5ML liquid Take 10 mLs by mouth 4 (four) times daily as needed for diarrhea or loose stools. 06/11/16   Clanford Marisa Hua, MD  enoxaparin  (LOVENOX) 30 MG/0.3ML injection Inject 0.3 mLs (30 mg total) into the skin daily. 06/08/16   Ainsley Spinner, PA-C  feeding supplement, ENSURE ENLIVE, (ENSURE ENLIVE) LIQD Take 237 mLs by mouth 2 (two) times daily between meals. 06/11/16   Clanford Marisa Hua, MD  FLUoxetine (PROZAC) 20 MG capsule Take 20 mg by mouth daily.     Historical Provider, MD  gabapentin (NEURONTIN) 300 MG capsule Take 600 mg by mouth 3 (three) times daily.     Historical Provider, MD  Glucagon, rDNA, (GLUCAGON EMERGENCY IJ) Inject 1 mg as directed once as needed (for severe hypoglycemia). Reported on 06/13/2016    Historical Provider, MD  insulin lispro (HUMALOG) 100 UNIT/ML injection Inject 3 Units into the skin 3 (three) times daily with meals. Patient uses as needed sliding scale. Hold if check blood glucose < 150.    Historical Provider, MD  isosorbide mononitrate (IMDUR) 60 MG 24 hr tablet Take 1 tablet (60 mg total) by mouth daily. 02/10/16   Bettey Costa, MD  lip balm (BLISTEX) OINT Apply 1 application topically as needed for lip care. 06/11/16   Clanford Marisa Hua, MD  lisinopril (PRINIVIL,ZESTRIL) 5 MG tablet Take 1 tablet (5 mg total) by mouth at bedtime. 06/11/16   Clanford Marisa Hua, MD  nitroGLYCERIN (NITROSTAT) 0.4 MG SL tablet Place 0.4 mg under the tongue every 5 (five) minutes as needed for chest pain. Reported on 06/13/2016    Historical Provider, MD  pantoprazole (PROTONIX) 40 MG tablet Take 40 mg by mouth daily.    Historical Provider, MD  simvastatin (ZOCOR) 20 MG tablet Take 20 mg by mouth at bedtime.     Historical Provider, MD  topiramate (TOPAMAX) 100 MG tablet Take 100 mg by mouth 2 (two) times daily.     Historical Provider, MD   BP 82/44 mmHg  Pulse 71  Temp(Src) 92.7 F (33.7 C) (Rectal)  Resp 18  Ht 5\' 8"  (1.727 m)  Wt 120 lb (54.432 kg)  BMI 18.25 kg/m2  SpO2 100% Physical Exam  Constitutional: She appears well-developed and well-nourished. No distress.  Somnolent but arousable  HENT:  Head:  Normocephalic and atraumatic.  dry mucous membranes  Eyes: Conjunctivae are normal. Pupils are equal, round, and reactive to light.  Neck: Neck supple.  Cardiovascular: Normal rate, regular rhythm and normal heart sounds.   No murmur heard. Pulmonary/Chest: Effort normal.  Crackles L lower lung  Abdominal: Soft. Bowel sounds are normal. She exhibits no distension. There is tenderness (generalized TTP). There is no rebound and no guarding.  Musculoskeletal: She exhibits no edema.  Limited ROM R hip  Neurological:  Sleepy but arousable, oriented to person but confused on time, able to follow basic commands; normal sensation x all 4 extremities  Skin: Skin is warm and dry.  Nursing note and vitals reviewed.   ED Course  .Critical Care Performed by: Sharlett Iles Authorized by: Sharlett Iles Total critical care time: 60 minutes Critical care was necessary to treat or  prevent imminent or life-threatening deterioration of the following conditions: sepsis. Critical care was time spent personally by me on the following activities: discussions with consultants, evaluation of patient's response to treatment, examination of patient, obtaining history from patient or surrogate, ordering and performing treatments and interventions, ordering and review of laboratory studies, ordering and review of radiographic studies, re-evaluation of patient's condition and review of old charts.   (including critical care time) Labs Review Labs Reviewed  CBC WITH DIFFERENTIAL/PLATELET - Abnormal; Notable for the following:    RBC 3.29 (*)    Hemoglobin 9.7 (*)    HCT 31.2 (*)    RDW 16.8 (*)    Platelets 149 (*)    Neutro Abs 7.9 (*)    All other components within normal limits  CBG MONITORING, ED - Abnormal; Notable for the following:    Glucose-Capillary >600 (*)    All other components within normal limits  URINE CULTURE  CULTURE, BLOOD (ROUTINE X 2)  CULTURE, BLOOD (ROUTINE X 2)   COMPREHENSIVE METABOLIC PANEL  URINALYSIS, ROUTINE W REFLEX MICROSCOPIC (NOT AT [facility])  BLOOD GAS, VENOUS  LIPASE, BLOOD  I-STAT CG4 LACTIC ACID, ED    Imaging Review No results found. I have personally reviewed and evaluated these  lab results as part of my medical decision-making.   EKG Interpretation   Date/Time:  Wednesday June 13 2016 16:04:19 EDT Ventricular Rate:  73 PR Interval:  169 QRS Duration: 107 QT Interval:  509 QTC Calculation: 561 R Axis:   65 Text Interpretation:  Sinus rhythm Borderline ST depression, diffuse leads  Prolonged QT interval peaked T waves more prominent from previous  Prolonged QT new from previous Confirmed by Kentrell Guettler MD, [place] [phone]) on  06/13/2016 4:08:29 PM     Medications  sodium chloride 0.9 % bolus 1,000 mL (1,000 mLs Intravenous New Bag/Given 06/13/16 1623)    And  sodium chloride 0.9 % bolus 500 mL (not administered)    And  sodium chloride 0.9 % bolus 250 mL (not administered)  vancomycin (VANCOCIN) IVPB 1000 mg/200 mL premix (not administered)  cefTAZidime (FORTAZ) 2 g in dextrose 5 % 50 mL IVPB (not administered)  vancomycin (VANCOCIN) 500 mg in sodium chloride 0.9 % 100 mL IVPB (not administered)  sodium chloride 0.9 % bolus 1,000 mL (not administered)    MDM   Final diagnoses:  Septic shock (HCC)  Acute pancreatitis, unspecified pancreatitis type  Diabetic ketoacidosis without coma associated with type 2 diabetes mellitus ([facility])   Patient discharged 2 days ago from hospital after hip surgery, noted to have a UTI and treated during hospitalization, p/w lethargy, decreased PO intake noted today. On exam, she was somnolent with some confusion but able to follow basic commands. Vital signs notable for BP 95/49, heart rate 63, rectal temperature 92.7. Because of hypotension and hypothermia, initiated a code sepsis with blood and urine cultures, vancomycin and Ceftazidime, and 2788ml fluids. EKG showed prolonged QT and mildly  peaked T waves new from previous. Initial blood glucose greater than 600. VBG with pH 7.23, CO2 23, normal lactate. Glucose 743, creatinine elevated from baseline at 3.82, BUN 72, anion gap 20. AST 44, total bilirubin 2.3. CBC stable from previous. UA with evidence of infection. Lipase significantly elevated at 614. Ordered right upper quadrant ultrasound to evaluate for gallstone pancreatitis, as patient is too unstable to go to CT scanner. Initiated an insulin drip for treatment of DKA. Her pressure remained low after fluids, therefore initiated levophed drip. Discussed with Critical  Care, Dr. Lake Bells, and NP Georgann Housekeeper. Patient admitted to ICU for further care.  Sharlett Iles, MD 06/14/16 0130

## 2016-06-13 NOTE — Progress Notes (Signed)
Pharmacy Antibiotic Note  [name] is a 65 y.o. female admitted on 06/13/2016 with sepsis.  Pharmacy has been consulted for vancomycin and ceftazidime dosing. Received vancomycin 1g *1 in the ED.  Plan: Vancomycin 500 IV every 24 hours.  Goal trough 15-20 mcg/mL. Ceftazidime 2g every 24 hours  Follow renal function closely Vanc trough at steady state  Height: 5\' 8"  (172.7 cm) Weight: 120 lb (54.432 kg) IBW/kg (Calculated) : 63.9  Temp (24hrs), Avg:95.3 F (35.2 C), Min:92.7 F (33.7 C), Max:97.9 F (36.6 C)   Recent Labs Lab 06/07/16 0350 06/08/16 0402 06/09/16 0258 06/10/16 0815 06/13/16 1630  WBC 5.8 4.9 4.2 4.6  --   CREATININE 1.51* 1.63* 2.02* 1.94*  --   LATICACIDVEN  --   --   --   --  1.99    Estimated Creatinine Clearance: 24.5 mL/min (by C-G formula based on Cr of 1.94).    Allergies  Allergen Reactions  . Hydralazine Hcl Swelling  . Bisphosphonates Rash  . Codeine Phosphate Nausea And Vomiting and Rash  . Penicillins Itching, Rash and Other (See Comments)    Has patient had a PCN reaction causing immediate rash, facial/tongue/throat swelling, SOB or lightheadedness with hypotension: No Has patient had a PCN reaction causing severe rash involving mucus membranes or skin necrosis: No Has patient had a PCN reaction that required hospitalization No Has patient had a PCN reaction occurring within the last 10 years: No If all of the above answers are "NO", then may proceed with Cephalosporin use.  Pt tolerant to cephalosporins   . Sulfa Antibiotics Itching and Rash    Antimicrobials this admission: 6/14 vancomycin >>  6/14 ceftazidime >>   Dose adjustments this admission: NA  Microbiology results: 6/14 UCx:    Thank you for allowing pharmacy to be a part of this patient's care.  Melburn Popper, PharmD Clinical Pharmacy Resident Pager: [phone] 06/13/2016 4:36 PM

## 2016-06-13 NOTE — Progress Notes (Signed)
CRITICAL VALUE ALERT  Critical value received:  Troponin 9.95, Glucose 671  Date of notification:  06/13/2016  Time of notification:  0930  Critical value read back:Yes.    Nurse who received alert:  Dorene Grebe, RN  MD notified (1st page):  Calton Dach, MD  Time of first page:  [phone], given at bedside  MD notified (2nd page):  Time of second page:  Responding MD:  Calton Dach, MD  Time MD responded:  [phone], given at bedside.

## 2016-06-13 NOTE — ED Notes (Signed)
Bair Hugger applied per MD.

## 2016-06-13 NOTE — Progress Notes (Signed)
Patient ID: [name], female   DOB: 1951/05/28, 65 y.o.   MRN: [id number]  Location:   [facility] Room Number: [phone] Place of Service:  SNF [phone]) Provider: Dinah Ngetich FNP-C   [name], MD  Patient Care Team: [name], MD as PCP - General (Family Medicine)  Extended Emergency Contact Information Primary Emergency Contact: Lucille Passy [place] Phone: [phone] Mobile Phone: [phone] Relation: Son Secondary Emergency Contact: [place] Phone: [phone] Relation: Relative  Code Status:  Full Code  Goals of care: Advanced Directive information Advanced Directives 06/05/2016  Does patient have an advance directive? No  Would patient like information on creating an advanced directive? -     Chief Complaint  Patient presents with  . Acute Visit    HPI:  Pt is a 65 y.o. female seen today at [facility] for an acute visit for low blood pressure. She is here for short term rehabilitation post hospital admission from 06/05/2016-06/11/2016 for fall . She was found to have a femoral neck fracture on X-ray. She underwent Right hip Arthroplasty 06/07/2016. She has a medical history of Type 2 DM, CAD, Anemia, Hyperlipidemia, Depression, GERD, CKD stage 4, Thrombocytopenia, Fibromyalgia among others. She is seen in her room today per facility Nurse request. Facility Nurse reports patient's B/p ranging in the 90's/40's-110's/46. Also states patient has had poor oral intake with generalized weakness. She has not been able to participate in physical Therapy due to orthostatic Blood pressure. Unable to obtain HPI and ROS from patient she opens eyes but does not follow commands. She moans with movement.    Past Medical History  Diagnosis Date  . DIABETES MELLITUS, II, COMPLICATIONS [id number]  . HYPERLIPIDEMIA 02/27/2007  . 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 ANEMIA,  OTHER, UNSPECIFIED 02/27/2007  . DEPRESSIVE DISORDER, NOS 02/27/2007  . HYPERTENSION, BENIGN SYSTEMIC 02/27/2007  . CORONARY, ARTERIOSCLEROSIS 02/27/2007  . TIA 05/17/2009  . GASTROESOPHAGEAL REFLUX, NO ESOPHAGITIS 02/27/2007  . RENAL INSUFFICIENCY, CHRONIC 12/28/2009  . Renal failure, acute on chronic (HCC) 01/25/2014  . Blood transfusion without reported diagnosis   . Anemia    Past Surgical History  Procedure Laterality Date  . Partial hysterectomy    . Rectocele repair    . Bladder surgery      Tack  . Hip arthroplasty Right 06/07/2016    Procedure: ARTHROPLASTY BIPOLAR HIP (HEMIARTHROPLASTY);  Surgeon: Altamese Moline Acres, MD;  Location: [facility];  Service: Orthopedics;  Laterality: Right;    Allergies  Allergen Reactions  . Hydralazine Hcl Swelling  . Bisphosphonates Rash  . Codeine Phosphate Nausea And Vomiting and Rash  . Penicillins Itching, Rash and Other (See Comments)    Has patient had a PCN reaction causing immediate rash, facial/tongue/throat swelling, SOB or lightheadedness with hypotension: No Has patient had a PCN reaction causing severe rash involving mucus membranes or skin necrosis: No Has patient had a PCN reaction that required hospitalization No Has patient had a PCN reaction occurring within the last 10 years: No If all of the above answers are "NO", then may proceed with Cephalosporin use.  Pt tolerant to cephalosporins   . Sulfa Antibiotics Itching and Rash      Medication List       This list is accurate as of: 06/13/16  3:39 PM.  Always use your most recent med list.  acetaminophen 325 MG tablet  Commonly known as:  TYLENOL  Take 2 tablets (650 mg total) by mouth every 6 (six) hours as needed for mild pain (or Fever >/= 101).     ALPRAZolam 0.5 MG tablet  Commonly known as:  XANAX  Take 1 tablet (0.5 mg total) by mouth 3 (three) times daily as needed for anxiety.     amLODipine 5 MG tablet  Commonly known as:  NORVASC  Take 1 tablet (5  mg total) by mouth daily.     carvedilol 3.125 MG tablet  Commonly known as:  COREG  Take 1 tablet (3.125 mg total) by mouth 2 (two) times daily with a meal.     clopidogrel 75 MG tablet  Commonly known as:  PLAVIX  Take 75 mg by mouth at bedtime.     diphenoxylate-atropine 2.5-0.025 MG/5ML liquid  Commonly known as:  LOMOTIL  Take 10 mLs by mouth 4 (four) times daily as needed for diarrhea or loose stools.     enoxaparin 30 MG/0.3ML injection  Commonly known as:  LOVENOX  Inject 0.3 mLs (30 mg total) into the skin daily.     feeding supplement (ENSURE ENLIVE) Liqd  Take 237 mLs by mouth 2 (two) times daily between meals.     Fenofibric Acid 135 MG Cpdr  Take 135 mg by mouth at bedtime.     FLUoxetine 20 MG capsule  Commonly known as:  PROZAC  Take 20 mg by mouth daily.     gabapentin 300 MG capsule  Commonly known as:  NEURONTIN  Take 600 mg by mouth 3 (three) times daily.     GLUCAGON EMERGENCY IJ  Inject 1 mg as directed once as needed (for severe hypoglycemia). Reported on 06/13/2016     HUMALOG 100 UNIT/ML injection  Generic drug:  insulin lispro  Inject 3 Units into the skin 3 (three) times daily with meals. Patient uses as needed sliding scale. Hold if check blood glucose < 150.     isosorbide mononitrate 60 MG 24 hr tablet  Commonly known as:  IMDUR  Take 1 tablet (60 mg total) by mouth daily.     lip balm Oint  Apply 1 application topically as needed for lip care.     lisinopril 5 MG tablet  Commonly known as:  PRINIVIL,ZESTRIL  Take 1 tablet (5 mg total) by mouth at bedtime.     nitroGLYCERIN 0.4 MG SL tablet  Commonly known as:  NITROSTAT  Place 0.4 mg under the tongue every 5 (five) minutes as needed for chest pain. Reported on 06/13/2016     pantoprazole 40 MG tablet  Commonly known as:  PROTONIX  Take 40 mg by mouth daily.     simvastatin 20 MG tablet  Commonly known as:  ZOCOR  Take 20 mg by mouth at bedtime.     topiramate 100 MG tablet    Commonly known as:  TOPAMAX  Take 100 mg by mouth 2 (two) times daily.     Vitamin D3 5000 units Tabs  Take 5,000 Units by mouth daily.        Review of Systems  Unable to perform ROS: Acuity of condition    Immunization History  Administered Date(s) Administered  . Influenza Split 10/22/2012  . Influenza Whole 10/08/2008  . Influenza,inj,Quad PF,36+ Mos 02/08/2016  . Pneumococcal Polysaccharide-23 02/08/2016  . Td 05/31/2006   Pertinent  Health Maintenance Due  Topic Date Due  . FOOT EXAM  11/13/1959  .  OPHTHALMOLOGY EXAM  11/13/1959  . MAMMOGRAM  11/13/1999  . COLONOSCOPY  11/13/1999  . DEXA SCAN  11/12/2014  . INFLUENZA VACCINE  07/31/2016  . HEMOGLOBIN A1C  12/05/2016  . PNA vac Low Risk Adult (2 of 2 - PCV13) 02/07/2017   No flowsheet data found. Functional Status Survey:    Filed Vitals:   06/13/16 1421  BP: 118/46  Pulse: 92  Temp: 97.9 F (36.6 C)  TempSrc: Oral  Resp: 20  Height: 5\' 3"  (1.6 m)  Weight: 121 lb (54.885 kg)  SpO2: 99%   Body mass index is 21.44 kg/(m^2). Physical Exam  Constitutional: She appears well-developed and well-nourished.  Opens eyes but does not follow commands. Moans with movement.   HENT:  Head: Normocephalic.  Mouth/Throat: Oropharynx is clear and moist.  Neck: Neck supple.  Cardiovascular: Normal rate, regular rhythm, normal heart sounds and intact distal pulses.  Exam reveals no gallop and no friction rub.   No murmur heard. Pulmonary/Chest: Effort normal. No respiratory distress. She has no wheezes. She has no rales.  Diminished breath sounds though not taking deep breaths.   Abdominal: Soft. Bowel sounds are normal. She exhibits no distension. There is no tenderness. There is no rebound and no guarding.  Musculoskeletal: She exhibits no edema or tenderness.  Neurological: She is alert.  Lethargic   Skin: Skin is warm and dry. No rash noted. No erythema. No pallor.  Psychiatric:  Moans with movement.      Labs reviewed:  Recent Labs  06/05/16 1802  06/08/16 0402 06/09/16 0258 06/10/16 0815  NA  --   < > 136 135 136  K  --   < > 3.6 3.7 3.7  CL  --   < > 111 108 108  CO2  --   < > 19* 22 20*  GLUCOSE  --   < > 199* 218* 199*  BUN  --   < > 26* 31* 30*  CREATININE  --   < > 1.63* 2.02* 1.94*  CALCIUM  --   < > 7.6* 7.8* 8.2*  MG 1.9  --   --   --   --   PHOS 3.8  --   --   --   --   < > = values in this interval not displayed.  Recent Labs  03/10/16 0536 06/05/16 1740 06/07/16 0350  AST 73* 28 23  ALT 30 24 19   ALKPHOS 56 45 30*  BILITOT 1.1 0.6 1.1  PROT 5.4* 5.4* 4.6*  ALBUMIN 3.0* 3.0* 2.4*    Recent Labs  06/05/16 1740  06/07/16 0350 06/08/16 0402 06/09/16 0258 06/10/16 0815  WBC 3.7*  < > 5.8 4.9 4.2 4.6  NEUTROABS 2.5  --  4.7  --  3.0  --   HGB 7.3*  < > 10.1* 8.8* 7.9* 11.6*  HCT 22.6*  < > 30.7* 26.9* 23.9* 34.3*  MCV 96.2  < > 88.5 89.1 88.2 87.3  PLT 170  < > 113* 100* 89* 96*  < > = values in this interval not displayed. Lab Results  Component Value Date   TSH 1.933 06/07/2016   Lab Results  Component Value Date   HGBA1C 7.6* 06/05/2016   Lab Results  Component Value Date   CHOL 97 06/24/2015   HDL 28* 06/24/2015   LDLCALC 52 06/24/2015   TRIG 87 06/24/2015   CHOLHDL 3.5 06/24/2015    Significant Diagnostic Results in last 30 days:  Dg Chest 1  View  06/05/2016  CLINICAL DATA:  Status post fall today with obvious deformity of the right leg. EXAM: CHEST 1 VIEW COMPARISON:  February 07, 2016. FINDINGS: The heart size and mediastinal contours are stable. The heart size is mildly enlarged. Both lungs are clear. The visualized skeletal structures are stable. IMPRESSION: No active cardiopulmonary disease. Electronically Signed   By: Abelardo Diesel M.D.   On: 06/05/2016 18:36   Abd 1 View (kub)  06/06/2016  CLINICAL DATA:  Chronic diarrhea for 3-4 years. Large distal colonic stool burden on recent CT. EXAM: ABDOMEN - 1 VIEW COMPARISON:  CT on  03/11/2016 FINDINGS: No evidence of dilated bowel loops. A small to moderate amount of stool is seen within the descending and rectosigmoid colon. IMPRESSION: No acute findings.  Small to moderate stool burden again noted. Electronically Signed   By: Earle Gell M.D.   On: 06/06/2016 08:24   Ct Head Wo Contrast  06/05/2016  CLINICAL DATA:  65 year old female with fall and trauma to the head. EXAM: CT HEAD WITHOUT CONTRAST CT CERVICAL SPINE WITHOUT CONTRAST TECHNIQUE: Multidetector CT imaging of the head and cervical spine was performed following the standard protocol without intravenous contrast. Multiplanar CT image reconstructions of the cervical spine were also generated. COMPARISON:  Head CT dated 06/28/2015 FINDINGS: CT HEAD FINDINGS The ventricles and sulci are appropriate in size for patient's age. Mild periventricular and deep white matter chronic microvascular ischemic changes noted. There is no acute intracranial hemorrhage. No mass effect or midline shift noted. There is partial opacification of the right sphenoid sinus. The remainder of the visualized paranasal sinuses and mastoid air cells are clear. The calvarium is intact. CT CERVICAL SPINE FINDINGS There is no acute fracture or subluxation of the cervical spine.There is osteopenia with minimal degenerative changes.The odontoid and spinous processes are intact.There is normal anatomic alignment of the C1-C2 lateral masses. The visualized soft tissues appear unremarkable. IMPRESSION: No acute intracranial hemorrhage. No Acute/traumatic cervical spine pathology. Electronically Signed   By: Anner Crete M.D.   On: 06/05/2016 20:34   Ct Cervical Spine Wo Contrast  06/05/2016  CLINICAL DATA:  65 year old female with fall and trauma to the head. EXAM: CT HEAD WITHOUT CONTRAST CT CERVICAL SPINE WITHOUT CONTRAST TECHNIQUE: Multidetector CT imaging of the head and cervical spine was performed following the standard protocol without intravenous  contrast. Multiplanar CT image reconstructions of the cervical spine were also generated. COMPARISON:  Head CT dated 06/28/2015 FINDINGS: CT HEAD FINDINGS The ventricles and sulci are appropriate in size for patient's age. Mild periventricular and deep white matter chronic microvascular ischemic changes noted. There is no acute intracranial hemorrhage. No mass effect or midline shift noted. There is partial opacification of the right sphenoid sinus. The remainder of the visualized paranasal sinuses and mastoid air cells are clear. The calvarium is intact. CT CERVICAL SPINE FINDINGS There is no acute fracture or subluxation of the cervical spine.There is osteopenia with minimal degenerative changes.The odontoid and spinous processes are intact.There is normal anatomic alignment of the C1-C2 lateral masses. The visualized soft tissues appear unremarkable. IMPRESSION: No acute intracranial hemorrhage. No Acute/traumatic cervical spine pathology. Electronically Signed   By: Anner Crete M.D.   On: 06/05/2016 20:34   Dg Knee Right Port  06/06/2016  CLINICAL DATA:  Femoral neck fracture, fall this morning EXAM: PORTABLE RIGHT KNEE - 1-2 VIEW COMPARISON:  None FINDINGS: Three views of the right knee submitted. No acute fracture or subluxation. There is diffuse osteopenia. Mild narrowing  of medial joint compartment. Small joint effusion. Atherosclerotic calcifications of femoral and popliteal artery. IMPRESSION: No acute fracture or subluxation. Mild degenerative changes. Small joint effusion. Diffuse osteopenia. Electronically Signed   By: Lahoma Crocker M.D.   On: 06/06/2016 12:10   Dg Hip Port Unilat With Pelvis 1v Right  06/07/2016  CLINICAL DATA:  Right femoral neck fracture.  Postop imaging. EXAM: DG HIP (WITH OR WITHOUT PELVIS) 1V PORT RIGHT COMPARISON:  Two days ago FINDINGS: New bipolar right hip hemiarthroplasty without periprosthetic fracture. The prosthesis is located. Negative visualized bony pelvis and  left hip. Osteopenia and atherosclerosis. IMPRESSION: No adverse finding after right hip hemiarthroplasty. Electronically Signed   By: Monte Fantasia M.D.   On: 06/07/2016 22:24   Dg Hip Unilat  With Pelvis 2-3 Views Right  06/05/2016  CLINICAL DATA:  Status post fall today with right leg deformity. EXAM: DG HIP (WITH OR WITHOUT PELVIS) 2-3V RIGHT COMPARISON:  None. FINDINGS: There is displaced fracture of the right femoral neck. No other fracture is identified. There is no dislocation. IMPRESSION: Displaced fracture of the right femoral. Electronically Signed   By: Abelardo Diesel M.D.   On: 06/05/2016 18:35    Assessment/Plan 1. Orthostatic hypotension Facility Nurse and Physical therapist reports  B/p ranging in the 90's/40's-110's/46.Has had Increased generalized weakness. Send to ER for evaluation.   2. Decreased oral intake Has decreased oral intake. Eat very small amount during Lunch time per facility Nurse.   3. Lethargy Worsening generalized weakness with orthostatic B/P. Send to ER for evaluation.     Family/ staff Communication:Reviewed plan of care with facility Nurse and  Nurse Supervisor   Labs/tests ordered:  None Send to ER  Spent > 40 minutes Coordinating with facility staff,reviewing medical record; tests; labs; and developing future plan of care.

## 2016-06-13 NOTE — Progress Notes (Signed)
Location:    [facility] Room Number: [id number] Place of Service: [place] Provider:  Kelby Fam, Dinah NP  [name], MD  Patient Care Team: [name], MD as PCP - General (Family Medicine)  Extended Emergency Contact Information Primary Emergency Contact: Lucille Passy [place] Phone: [phone] Mobile Phone: [phone] Relation: Son Secondary Emergency Contact: [place] Phone: [phone] Relation: Relative  Code Status:   Goals of care: Advanced Directive information Advanced Directives 06/05/2016  Does patient have an advance directive? No  Would patient like information on creating an advanced directive? -     Chief Complaint  Patient presents with  . Acute Visit    HPI:  Pt is a 65 y.o. female seen today for an acute visit for    Past Medical History  Diagnosis Date  . DIABETES MELLITUS, II, COMPLICATIONS [id number]  . HYPERLIPIDEMIA 02/27/2007  . 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 ANEMIA, OTHER, UNSPECIFIED 02/27/2007  . DEPRESSIVE DISORDER, NOS 02/27/2007  . HYPERTENSION, BENIGN SYSTEMIC 02/27/2007  . CORONARY, ARTERIOSCLEROSIS 02/27/2007  . TIA 05/17/2009  . GASTROESOPHAGEAL REFLUX, NO ESOPHAGITIS 02/27/2007  . RENAL INSUFFICIENCY, CHRONIC 12/28/2009  . Renal failure, acute on chronic (HCC) 01/25/2014  . Blood transfusion without reported diagnosis   . Anemia    Past Surgical History  Procedure Laterality Date  . Partial hysterectomy    . Rectocele repair    . Bladder surgery      Tack  . Hip arthroplasty Right 06/07/2016    Procedure: ARTHROPLASTY BIPOLAR HIP (HEMIARTHROPLASTY);  Surgeon: Altamese [place], MD;  Location: [facility];  Service: Orthopedics;  Laterality: Right;    Allergies  Allergen Reactions  . Hydralazine Hcl Swelling  . Bisphosphonates Rash  . Codeine Phosphate Nausea And Vomiting and Rash  . Penicillins Itching, Rash and Other (See Comments)    Has patient had a PCN  reaction causing immediate rash, facial/tongue/throat swelling, SOB or lightheadedness with hypotension: No Has patient had a PCN reaction causing severe rash involving mucus membranes or skin necrosis: No Has patient had a PCN reaction that required hospitalization No Has patient had a PCN reaction occurring within the last 10 years: No If all of the above answers are "NO", then may proceed with Cephalosporin use.  Pt tolerant to cephalosporins   . Sulfa Antibiotics Itching and Rash      Medication List       This list is accurate as of: 06/13/16  2:40 PM.  Always use your most recent med list.               acetaminophen 325 MG tablet  Commonly known as:  TYLENOL  Take 2 tablets (650 mg total) by mouth every 6 (six) hours as needed for mild pain (or Fever >/= 101).     ALPRAZolam 0.5 MG tablet  Commonly known as:  XANAX  Take 1 tablet (0.5 mg total) by mouth 3 (three) times daily as needed for anxiety.     amLODipine 5 MG tablet  Commonly known as:  NORVASC  Take 1 tablet (5 mg total) by mouth daily.     carvedilol 3.125 MG tablet  Commonly known as:  COREG  Take 1 tablet (3.125 mg total) by mouth 2 (two) times daily with a meal.     clopidogrel 75 MG tablet  Commonly known as:  PLAVIX  Take 75 mg by mouth at bedtime.     diphenoxylate-atropine 2.5-0.025 MG/5ML liquid  Commonly known as:  LOMOTIL  Take 10 mLs by mouth 4 (four) times daily as needed for diarrhea or loose stools.     enoxaparin 30 MG/0.3ML injection  Commonly known as:  LOVENOX  Inject 0.3 mLs (30 mg total) into the skin daily.     feeding supplement (ENSURE ENLIVE) Liqd  Take 237 mLs by mouth 2 (two) times daily between meals.     Fenofibric Acid 135 MG Cpdr  Take 135 mg by mouth at bedtime.     FLUoxetine 20 MG capsule  Commonly known as:  PROZAC  Take 20 mg by mouth daily.     gabapentin 300 MG capsule  Commonly known as:  NEURONTIN  Take 600 mg by mouth 3 (three) times daily.      GLUCAGON EMERGENCY IJ  Inject 1 mg as directed once as needed (for severe hypoglycemia). Reported on 06/13/2016     HUMALOG 100 UNIT/ML injection  Generic drug:  insulin lispro  Inject 3 Units into the skin 3 (three) times daily with meals. Patient uses as needed sliding scale. Hold if check blood glucose < 150.     isosorbide mononitrate 60 MG 24 hr tablet  Commonly known as:  IMDUR  Take 1 tablet (60 mg total) by mouth daily.     lip balm Oint  Apply 1 application topically as needed for lip care.     lisinopril 5 MG tablet  Commonly known as:  PRINIVIL,ZESTRIL  Take 1 tablet (5 mg total) by mouth at bedtime.     nitroGLYCERIN 0.4 MG SL tablet  Commonly known as:  NITROSTAT  Place 0.4 mg under the tongue every 5 (five) minutes as needed for chest pain. Reported on 06/13/2016     pantoprazole 40 MG tablet  Commonly known as:  PROTONIX  Take 40 mg by mouth daily.     simvastatin 20 MG tablet  Commonly known as:  ZOCOR  Take 20 mg by mouth at bedtime.     topiramate 100 MG tablet  Commonly known as:  TOPAMAX  Take 100 mg by mouth 2 (two) times daily.     Vitamin D3 5000 units Tabs  Take 5,000 Units by mouth daily.        Review of Systems  Immunization History  Administered Date(s) Administered  . Influenza Split 10/22/2012  . Influenza Whole 10/08/2008  . Influenza,inj,Quad PF,36+ Mos 02/08/2016  . Pneumococcal Polysaccharide-23 02/08/2016  . Td 05/31/2006   Pertinent  Health Maintenance Due  Topic Date Due  . FOOT EXAM  11/13/1959  . OPHTHALMOLOGY EXAM  11/13/1959  . MAMMOGRAM  11/13/1999  . COLONOSCOPY  11/13/1999  . DEXA SCAN  11/12/2014  . INFLUENZA VACCINE  07/31/2016  . HEMOGLOBIN A1C  12/05/2016  . PNA vac Low Risk Adult (2 of 2 - PCV13) 02/07/2017   No flowsheet data found. Functional Status Survey:    Filed Vitals:   06/13/16 1421  BP: 118/46  Pulse: 92  Temp: 97.9 F (36.6 C)  TempSrc: Oral  Resp: 20  Height: 5\' 3"  (1.6 m)  Weight: 121  lb (54.885 kg)  SpO2: 99%   Body mass index is 21.44 kg/(m^2). Physical Exam  Labs reviewed:  Recent Labs  06/05/16 1802  06/08/16 0402 06/09/16 0258 06/10/16 0815  NA  --   < > 136 135 136  K  --   < > 3.6 3.7 3.7  CL  --   < > 111 108 108  CO2  --   < >  19* 22 20*  GLUCOSE  --   < > 199* 218* 199*  BUN  --   < > 26* 31* 30*  CREATININE  --   < > 1.63* 2.02* 1.94*  CALCIUM  --   < > 7.6* 7.8* 8.2*  MG 1.9  --   --   --   --   PHOS 3.8  --   --   --   --   < > = values in this interval not displayed.  Recent Labs  03/10/16 0536 06/05/16 1740 06/07/16 0350  AST 73* 28 23  ALT 30 24 19   ALKPHOS 56 45 30*  BILITOT 1.1 0.6 1.1  PROT 5.4* 5.4* 4.6*  ALBUMIN 3.0* 3.0* 2.4*    Recent Labs  06/05/16 1740  06/07/16 0350 06/08/16 0402 06/09/16 0258 06/10/16 0815  WBC 3.7*  < > 5.8 4.9 4.2 4.6  NEUTROABS 2.5  --  4.7  --  3.0  --   HGB 7.3*  < > 10.1* 8.8* 7.9* 11.6*  HCT 22.6*  < > 30.7* 26.9* 23.9* 34.3*  MCV 96.2  < > 88.5 89.1 88.2 87.3  PLT 170  < > 113* 100* 89* 96*  < > = values in this interval not displayed. Lab Results  Component Value Date   TSH 1.933 06/07/2016   Lab Results  Component Value Date   HGBA1C 7.6* 06/05/2016   Lab Results  Component Value Date   CHOL 97 06/24/2015   HDL 28* 06/24/2015   LDLCALC 52 06/24/2015   TRIG 87 06/24/2015   CHOLHDL 3.5 06/24/2015    Significant Diagnostic Results in last 30 days:  Dg Chest 1 View  06/05/2016  CLINICAL DATA:  Status post fall today with obvious deformity of the right leg. EXAM: CHEST 1 VIEW COMPARISON:  February 07, 2016. FINDINGS: The heart size and mediastinal contours are stable. The heart size is mildly enlarged. Both lungs are clear. The visualized skeletal structures are stable. IMPRESSION: No active cardiopulmonary disease. Electronically Signed   By: Abelardo Diesel M.D.   On: 06/05/2016 18:36   Abd 1 View (kub)  06/06/2016  CLINICAL DATA:  Chronic diarrhea for 3-4 years. Large distal  colonic stool burden on recent CT. EXAM: ABDOMEN - 1 VIEW COMPARISON:  CT on 03/11/2016 FINDINGS: No evidence of dilated bowel loops. A small to moderate amount of stool is seen within the descending and rectosigmoid colon. IMPRESSION: No acute findings.  Small to moderate stool burden again noted. Electronically Signed   By: Earle Gell M.D.   On: 06/06/2016 08:24   Ct Head Wo Contrast  06/05/2016  CLINICAL DATA:  65 year old female with fall and trauma to the head. EXAM: CT HEAD WITHOUT CONTRAST CT CERVICAL SPINE WITHOUT CONTRAST TECHNIQUE: Multidetector CT imaging of the head and cervical spine was performed following the standard protocol without intravenous contrast. Multiplanar CT image reconstructions of the cervical spine were also generated. COMPARISON:  Head CT dated 06/28/2015 FINDINGS: CT HEAD FINDINGS The ventricles and sulci are appropriate in size for patient's age. Mild periventricular and deep white matter chronic microvascular ischemic changes noted. There is no acute intracranial hemorrhage. No mass effect or midline shift noted. There is partial opacification of the right sphenoid sinus. The remainder of the visualized paranasal sinuses and mastoid air cells are clear. The calvarium is intact. CT CERVICAL SPINE FINDINGS There is no acute fracture or subluxation of the cervical spine.There is osteopenia with minimal degenerative changes.The odontoid and spinous  processes are intact.There is normal anatomic alignment of the C1-C2 lateral masses. The visualized soft tissues appear unremarkable. IMPRESSION: No acute intracranial hemorrhage. No Acute/traumatic cervical spine pathology. Electronically Signed   By: Anner Crete M.D.   On: 06/05/2016 20:34   Ct Cervical Spine Wo Contrast  06/05/2016  CLINICAL DATA:  65 year old female with fall and trauma to the head. EXAM: CT HEAD WITHOUT CONTRAST CT CERVICAL SPINE WITHOUT CONTRAST TECHNIQUE: Multidetector CT imaging of the head and cervical  spine was performed following the standard protocol without intravenous contrast. Multiplanar CT image reconstructions of the cervical spine were also generated. COMPARISON:  Head CT dated 06/28/2015 FINDINGS: CT HEAD FINDINGS The ventricles and sulci are appropriate in size for patient's age. Mild periventricular and deep white matter chronic microvascular ischemic changes noted. There is no acute intracranial hemorrhage. No mass effect or midline shift noted. There is partial opacification of the right sphenoid sinus. The remainder of the visualized paranasal sinuses and mastoid air cells are clear. The calvarium is intact. CT CERVICAL SPINE FINDINGS There is no acute fracture or subluxation of the cervical spine.There is osteopenia with minimal degenerative changes.The odontoid and spinous processes are intact.There is normal anatomic alignment of the C1-C2 lateral masses. The visualized soft tissues appear unremarkable. IMPRESSION: No acute intracranial hemorrhage. No Acute/traumatic cervical spine pathology. Electronically Signed   By: Anner Crete M.D.   On: 06/05/2016 20:34   Dg Knee Right Port  06/06/2016  CLINICAL DATA:  Femoral neck fracture, fall this morning EXAM: PORTABLE RIGHT KNEE - 1-2 VIEW COMPARISON:  None FINDINGS: Three views of the right knee submitted. No acute fracture or subluxation. There is diffuse osteopenia. Mild narrowing of medial joint compartment. Small joint effusion. Atherosclerotic calcifications of femoral and popliteal artery. IMPRESSION: No acute fracture or subluxation. Mild degenerative changes. Small joint effusion. Diffuse osteopenia. Electronically Signed   By: Lahoma Crocker M.D.   On: 06/06/2016 12:10   Dg Hip Port Unilat With Pelvis 1v Right  06/07/2016  CLINICAL DATA:  Right femoral neck fracture.  Postop imaging. EXAM: DG HIP (WITH OR WITHOUT PELVIS) 1V PORT RIGHT COMPARISON:  Two days ago FINDINGS: New bipolar right hip hemiarthroplasty without periprosthetic  fracture. The prosthesis is located. Negative visualized bony pelvis and left hip. Osteopenia and atherosclerosis. IMPRESSION: No adverse finding after right hip hemiarthroplasty. Electronically Signed   By: Monte Fantasia M.D.   On: 06/07/2016 22:24   Dg Hip Unilat  With Pelvis 2-3 Views Right  06/05/2016  CLINICAL DATA:  Status post fall today with right leg deformity. EXAM: DG HIP (WITH OR WITHOUT PELVIS) 2-3V RIGHT COMPARISON:  None. FINDINGS: There is displaced fracture of the right femoral neck. No other fracture is identified. There is no dislocation. IMPRESSION: Displaced fracture of the right femoral. Electronically Signed   By: Abelardo Diesel M.D.   On: 06/05/2016 18:35    Assessment/Plan There are no diagnoses linked to this encounter.   Family/ staff Communication  Labs/tests ordered:    This encounter was created in error - please disregard. This encounter was created in error - please disregard. This encounter was created in error - please disregard.

## 2016-06-13 NOTE — H&P (Signed)
PULMONARY / CRITICAL CARE MEDICINE   Name: [name] MRN: [id number] DOB: 23-Oct-1951    ADMISSION DATE:  06/13/2016 CONSULTATION DATE:  06/13/2016  REFERRING MD:  Theotis Burrow, M.D. / EDP  CHIEF COMPLAINT:  Acute Encephalopathy  HISTORY OF PRESENT ILLNESS:   65 year old female with PMH as below, which includes DM, CAD, HTN, and recent admission for R femoral neck fracture s/p arthoplasty 6/8. She had suffered several fall prior to the fall that caused the fracture. Post operative course was fairly uncomplicated with the exception of some mild anemia requiring PRBC transfusion and UTI treated with levaquin. She was discharged to [facility] for rehabilitation on 6/12. 6/14 she returned to ER with chief complaint of AMS with associated hypotension. In the emergency department she was noted to be lethargic and confused. She was complaining of abdominal pain. She was hypotensive and hypothermic with elevated WBC. Glucose noted to be markedly elevated, however urine ketones negative. With metabolic acidosis. Lactic acid wnl. Due to persistent hypotension and concern for septic shock PCCM asked to admit.   PAST MEDICAL HISTORY :  Past Medical History  Diagnosis Date  . DIABETES MELLITUS, II, COMPLICATIONS [id number]  . HYPERLIPIDEMIA 02/27/2007  . 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 ANEMIA, OTHER, UNSPECIFIED 02/27/2007  . DEPRESSIVE DISORDER, NOS 02/27/2007  . HYPERTENSION, BENIGN SYSTEMIC 02/27/2007  . CORONARY, ARTERIOSCLEROSIS 02/27/2007  . TIA 05/17/2009  . GASTROESOPHAGEAL REFLUX, NO ESOPHAGITIS 02/27/2007  . RENAL INSUFFICIENCY, CHRONIC 12/28/2009  . Renal failure, acute on chronic (HCC) 01/25/2014  . Blood transfusion without reported diagnosis   . Anemia     PAST SURGICAL HISTORY: Past Surgical History  Procedure Laterality Date  . Partial hysterectomy    . Rectocele repair    . Bladder surgery      Tack  . Hip arthroplasty Right 06/07/2016    Procedure: ARTHROPLASTY  BIPOLAR HIP (HEMIARTHROPLASTY);  Surgeon: Altamese [place], MD;  Location: [facility];  Service: Orthopedics;  Laterality: Right;     Allergies  Allergen Reactions  . Codeine Other (See Comments)    Noted as an allergy on MAR  . Hydralazine Hcl Swelling  . Bisphosphonates Rash  . Codeine Phosphate Nausea And Vomiting and Rash  . Penicillins Itching, Rash and Other (See Comments)    Has patient had a PCN reaction causing immediate rash, facial/tongue/throat swelling, SOB or lightheadedness with hypotension: No Has patient had a PCN reaction causing severe rash involving mucus membranes or skin necrosis: No Has patient had a PCN reaction that required hospitalization No Has patient had a PCN reaction occurring within the last 10 years: No If all of the above answers are "NO", then may proceed with Cephalosporin use.  Pt tolerant to cephalosporins   . Sulfa Antibiotics Itching and Rash    No current facility-administered medications on file prior to encounter.   Current Outpatient Prescriptions on File Prior to Encounter  Medication Sig  . acetaminophen (TYLENOL) 325 MG tablet Take 2 tablets (650 mg total) by mouth every 6 (six) hours as needed for mild pain (or Fever >/= 101).  Marland Kitchen ALPRAZolam (XANAX) 0.5 MG tablet Take 1 tablet (0.5 mg total) by mouth 3 (three) times daily as needed for anxiety.  Marland Kitchen amLODipine (NORVASC) 5 MG tablet Take 1 tablet (5 mg total) by mouth daily.  . carvedilol (COREG) 3.125 MG tablet Take 1 tablet (3.125 mg total) by mouth 2 (two) times daily with a meal.  . Cholecalciferol (VITAMIN D3) 5000 units TABS Take 5,000  Units by mouth daily.  . Choline Fenofibrate (FENOFIBRIC ACID) 135 MG CPDR Take 135 mg by mouth at bedtime.   . clopidogrel (PLAVIX) 75 MG tablet Take 75 mg by mouth at bedtime.   . diphenoxylate-atropine (LOMOTIL) 2.5-0.025 MG/5ML liquid Take 10 mLs by mouth 4 (four) times daily as needed for diarrhea or loose stools.  . enoxaparin (LOVENOX) 30 MG/0.3ML  injection Inject 0.3 mLs (30 mg total) into the skin daily.  . feeding supplement, ENSURE ENLIVE, (ENSURE ENLIVE) LIQD Take 237 mLs by mouth 2 (two) times daily between meals.  Marland Kitchen FLUoxetine (PROZAC) 20 MG capsule Take 20 mg by mouth daily.   Marland Kitchen gabapentin (NEURONTIN) 300 MG capsule Take 600 mg by mouth 3 (three) times daily.   . Glucagon, rDNA, (GLUCAGON EMERGENCY IJ) Inject 1 mg as directed once as needed (for severe hypoglycemia). Reported on 06/13/2016  . insulin lispro (HUMALOG) 100 UNIT/ML injection Inject 3 Units into the skin 3 (three) times daily with meals. Patient uses as needed sliding scale. Hold if check blood glucose < 150.  . isosorbide mononitrate (IMDUR) 60 MG 24 hr tablet Take 1 tablet (60 mg total) by mouth daily.  Marland Kitchen lip balm (BLISTEX) OINT Apply 1 application topically as needed for lip care.  Marland Kitchen lisinopril (PRINIVIL,ZESTRIL) 5 MG tablet Take 1 tablet (5 mg total) by mouth at bedtime.  . nitroGLYCERIN (NITROSTAT) 0.4 MG SL tablet Place 0.4 mg under the tongue every 5 (five) minutes as needed for chest pain. Reported on 06/13/2016  . pantoprazole (PROTONIX) 40 MG tablet Take 40 mg by mouth daily.  . simvastatin (ZOCOR) 20 MG tablet Take 20 mg by mouth at bedtime.   . topiramate (TOPAMAX) 100 MG tablet Take 100 mg by mouth 2 (two) times daily.     FAMILY HISTORY:  Family History  Problem Relation Age of Onset  . Cancer Mother     unknown  . CAD Mother   . Cancer Sister     unknown cancer ?breast ca    SOCIAL HISTORY: Social History   Social History  . Marital Status: Divorced    Spouse Name: N/A  . Number of Children: 2  . Years of Education: N/A   Occupational History  . Disabled    Social History Main Topics  . Smoking status: Never Smoker   . Smokeless tobacco: Never Used  . Alcohol Use: No  . Drug Use: No  . Sexual Activity: Not Asked   Other Topics Concern  . None   Social History Narrative    REVIEW OF SYSTEMS:  Unable to obtain due to  encephalopathy.  SUBJECTIVE: As above.  VITAL SIGNS: BP 88/49 mmHg  Pulse 69  Temp(Src) 92.7 F (33.7 C) (Rectal)  Resp 19  Ht 5\' 8"  (1.727 m)  Wt 54.432 kg (120 lb)  BMI 18.25 kg/m2  SpO2 100%  HEMODYNAMICS:    VENTILATOR SETTINGS:    INTAKE / OUTPUT: I/O last 3 completed shifts: In: 2750 [I.V.:2750] Out: -   PHYSICAL EXAMINATION: General:  Chronically ill appearing female. No distress. Comfortable. Neuro:  Following commands. Oriented to place, person, & president but not year. Grossly nonfocal. CN grossly in tact. HEENT:  Dry mucus membranes. No scleral injection or icterus.  Cardiovascular:  Regular rate. Trace edema. Normal S1 & S2. Lungs:  Coarse breath sounds bilaterally. Normal work of breathing on supplemental oxygen. Abdomen:  Soft, non-tender, non-distended Musculoskeletal:  No acute deformity. Strength 5/5 & symmetric. Skin:  Warm & dry. No rash  on exposed skin.  LABS:  BMET  Recent Labs Lab 06/09/16 0258 06/10/16 0815 06/13/16 1618  NA 135 136 129*  K 3.7 3.7 4.4  CL 108 108 99*  CO2 22 20* 10*  BUN 31* 30* 72*  CREATININE 2.02* 1.94* 3.82*  GLUCOSE 218* 199* 743*    Electrolytes  Recent Labs Lab 06/09/16 0258 06/10/16 0815 06/13/16 1618  CALCIUM 7.8* 8.2* 8.3*    CBC  Recent Labs Lab 06/09/16 0258 06/10/16 0815 06/13/16 1618  WBC 4.2 4.6 9.4  HGB 7.9* 11.6* 9.7*  HCT 23.9* 34.3* 31.2*  PLT 89* 96* 149*    Coag's  Recent Labs Lab 06/07/16 0350  APTT 36  INR 1.24    Sepsis Markers  Recent Labs Lab 06/13/16 1630  LATICACIDVEN 1.99    ABG No results for input(s): PHART, PCO2ART, PO2ART in the last 168 hours.  Liver Enzymes  Recent Labs Lab 06/07/16 0350 06/13/16 1618  AST 23 44*  ALT 19 17  ALKPHOS 30* 78  BILITOT 1.1 2.3*  ALBUMIN 2.4* 2.0*    Cardiac Enzymes No results for input(s): TROPONINI, PROBNP in the last 168 hours.  Glucose  Recent Labs Lab 06/10/16 2354 06/11/16 0453  06/11/16 1210 06/11/16 1619 06/13/16 1626 06/13/16 1850  GLUCAP 215* 106* 201* 177* >600* >600*    Imaging Dg Chest Port 1 View  06/13/2016  CLINICAL DATA:  Altered mental status with abdominal pain for 2 days. History of diabetes and anemia. EXAM: PORTABLE CHEST 1 VIEW COMPARISON:  06/05/2016 and 02/07/2016 radiographs. FINDINGS: 1729 hours. There are lower lung volumes. Allowing for this, the heart size and mediastinal contours are stable. There are new left-greater-than-right perihilar and lower lobe airspace opacities with a probable small left pleural effusion. No evidence of pneumothorax. The bones appear unchanged. IMPRESSION: New lower lung volumes with left-greater-than-right perihilar and lower lobe airspace opacities. These findings are nonspecific and may reflect asymmetric edema, although are worrisome for possible aspiration. Electronically Signed   By: Richardean Sale M.D.   On: 06/13/2016 17:39   Dg Abd Portable 1v  06/13/2016  CLINICAL DATA:  Altered mental status with abdominal pain for 2 days. History of diabetes and anemia. EXAM: PORTABLE ABDOMEN - 1 VIEW COMPARISON:  Radiographs 06/06/2016.  CT 03/11/2016. FINDINGS: 1728 hours. Prominent stool is again noted within the rectum. The bowel gas pattern is nonobstructive. There is no evidence of bowel wall thickening or free air. Interval right hip hemiarthroplasty and scattered vascular calcifications are noted. IMPRESSION: No acute abdominal findings. Prominent stool in the rectum, similar to prior study. Electronically Signed   By: Richardean Sale M.D.   On: 06/13/2016 17:41    STUDIES:  TTE (06/24/15): EF 55-60%. LA & RA normal in size. RV normal in size and function. No aortic stenosis or regurgitation. Mild mitral regurgitation. Mild tricuspid regurgitation. Port CXR 6/14: Bilateral hilar fullness and lower lobe opacification with silhouetting of the left hemidiaphragm. Port Abd X-ray 6/14:  No free air under diaphragm. Right  hip hemiarthroplasty. Bowel gas pattern nonobstructive.  MICROBIOLOGY: MRSA PCR 6/14 >> Blood Ctx x2 6/14 >> Urine Ctx 6/14 >>  ANTIBIOTICS: Tressie Ellis 6/14 >> Vancomycin 6/14 >>  SIGNIFICANT EVENTS: 6/14 - Admit  LINES/TUBES: PIV x3  DISCUSSION:  65 year old female presenting with shock which is gradually resolving and diabetic ketoacidosis. Suspect underlying sepsis. Clinical status improving with correction of acidosis and hypotension. Elevated cardiac biomarkers suspicious for demand ischemia. Continuing broad-spectrum antibiotics while awaiting culture results.  ASSESSMENT / PLAN:  PULMONARY A: HCAP  P:   Continuous Pulse Ox 2 L/m supplemental with elevated troponin I  CARDIOVASCULAR A:  Shock - Likely combination of hypovolemia & sepsis. Elevated Troponin I - Likely due to shock & acidosis. H/O Hyperlipidemia H/O HTN H/O CAD  P:  Monitoring on telemetry Vitals per unit protocol Checking Complete TTE Trending Troponin I q6hr Weaning Levophed to off   RENAL A:   Acute on Chronic Renal Failure - Baseline creatinine 1.9-2.0. Metabolic Acidosis - From DKA.  P:   Trending UOP Monitoring electrolytes & renal function daily Replacing electrolytes as indicated  GASTROINTESTINAL A:   Abdominal Pain - Possible pancreatitis with elevated Lipase. Bilirubin elevated. H/O GERD  P:   NPO Complete Abd [place] pending Repeat LFTs in AM  HEMATOLOGIC/ONCOLOGIC A:   Anemia - Near baseline anemia. No signs of active bleeding. Thrombocytopenia - Slightly above baseline due to hemoconcentration. H/O MGUS  P:  Trending cell counts daily w/ CBC SCDs Heparin [place] q8hr  INFECTIOUS A:   Sepsis - UTI vs HCAP vs Intra-Abdominal Source.  P:   Empiric Vancomycin & Tressie Ellis Day #1 Trending Procalcitonin per algorithm Awaiting blood & urine culture results Urine Strep & Legionella Antigen  ENDOCRINE A:   DKA H/O DM Type 2    P:   Mangment per DKA protocol  4.75 total  liters NS ordered (3.75 given in ED) Check beta-hydroxybutyric acid  NEUROLOGIC A:   Acute Encephalopathy - Likely metabolic. Improving. H/O Depression H/O TIA  P:   Monitor closely in ICU.   FAMILY  - Updates: No family at bedside 6/14.   - Inter-disciplinary family meet or Palliative Care meeting due by:  6/21  I have spent a total of 38 minutes of critical care time today caring for the patient and reviewing the patient's electronic medical record.  Sonia Baller Ashok Cordia, M.D. [facility] Pulmonary & Critical Care Pager:  [phone] After 3pm or if no response, call [phone] 9:17 PM 06/13/2016

## 2016-06-13 NOTE — ED Notes (Signed)
Critical Glucose 743-MD Little aware.

## 2016-06-13 NOTE — ED Notes (Signed)
Pt presents via [facility] from [facility] for increased lethargy.  Staff unable to give LSN.  Pt recent hip surgery, went to [facility] on Monday.  Staff reports increase in lethargy and decrease in PO intake since arrival to facility.  EMS reports L facial droop, grips = although weak, moves all extremities.  Pt oriented to situation, self, place, disoriented to time.  Hx: Stroke, unknown deficits.  EKG unremarkable.  18g L wrist.

## 2016-06-14 ENCOUNTER — Other Ambulatory Visit (HOSPITAL_COMMUNITY): Payer: Commercial Managed Care - HMO

## 2016-06-14 DIAGNOSIS — E872 Acidosis: Secondary | ICD-10-CM

## 2016-06-14 DIAGNOSIS — G9340 Encephalopathy, unspecified: Secondary | ICD-10-CM

## 2016-06-14 LAB — BASIC METABOLIC PANEL
Anion gap: 9 (ref 5–15)
Anion gap: 11 (ref 5–15)
Anion gap: 10 (ref 5–15)
BUN: 64 mg/dL — ABNORMAL HIGH (ref 6–20)
BUN: 65 mg/dL — AB (ref 6–20)
BUN: 66 mg/dL — AB (ref 6–20)
CHLORIDE: 108 mmol/L (ref 101–111)
CHLORIDE: 108 mmol/L (ref 101–111)
CHLORIDE: 109 mmol/L (ref 101–111)
CO2: 16 mmol/L — ABNORMAL LOW (ref 22–32)
CO2: 15 mmol/L — AB (ref 22–32)
CO2: 15 mmol/L — AB (ref 22–32)
Calcium: 7.6 mg/dL — ABNORMAL LOW (ref 8.9–10.3)
Calcium: 7.6 mg/dL — ABNORMAL LOW (ref 8.9–10.3)
Calcium: 7.9 mg/dL — ABNORMAL LOW (ref 8.9–10.3)
Creatinine, Ser: 3.12 mg/dL — ABNORMAL HIGH (ref 0.44–1.00)
Creatinine, Ser: 3.06 mg/dL — ABNORMAL HIGH (ref 0.44–1.00)
Creatinine, Ser: 2.92 mg/dL — ABNORMAL HIGH (ref 0.44–1.00)
GFR calc Af Amer: 17 mL/min — ABNORMAL LOW (ref >60)
GFR calc Af Amer: 17 mL/min — ABNORMAL LOW (ref >60)
GFR calc non Af Amer: 15 mL/min — ABNORMAL LOW (ref >60)
GFR calc non Af Amer: 15 mL/min — ABNORMAL LOW (ref >60)
GFR calc non Af Amer: 16 mL/min — ABNORMAL LOW (ref >60)
GFR, EST AFRICAN AMERICAN: 18 mL/min — AB (ref >60)
Glucose, Bld: 496 mg/dL — ABNORMAL HIGH (ref 65–99)
Glucose, Bld: 372 mg/dL — ABNORMAL HIGH (ref 65–99)
Glucose, Bld: 139 mg/dL — ABNORMAL HIGH (ref 65–99)
POTASSIUM: 3.1 mmol/L — AB (ref 3.5–5.1)
POTASSIUM: 3.3 mmol/L — AB (ref 3.5–5.1)
POTASSIUM: 3.4 mmol/L — AB (ref 3.5–5.1)
SODIUM: 132 mmol/L — AB (ref 135–145)
SODIUM: 134 mmol/L — AB (ref 135–145)
SODIUM: 135 mmol/L (ref 135–145)

## 2016-06-14 LAB — GLUCOSE, CAPILLARY
GLUCOSE-CAPILLARY: 512 mg/dL — AB (ref 65–99)
GLUCOSE-CAPILLARY: 509 mg/dL — AB (ref 65–99)
GLUCOSE-CAPILLARY: 356 mg/dL — AB (ref 65–99)
GLUCOSE-CAPILLARY: 294 mg/dL — AB (ref 65–99)
GLUCOSE-CAPILLARY: 263 mg/dL — AB (ref 65–99)
GLUCOSE-CAPILLARY: 193 mg/dL — AB (ref 65–99)
GLUCOSE-CAPILLARY: 151 mg/dL — AB (ref 65–99)
GLUCOSE-CAPILLARY: 91 mg/dL (ref 65–99)
GLUCOSE-CAPILLARY: 89 mg/dL (ref 65–99)
GLUCOSE-CAPILLARY: 210 mg/dL — AB (ref 65–99)
GLUCOSE-CAPILLARY: 217 mg/dL — AB (ref 65–99)
Glucose-Capillary: 100 mg/dL — ABNORMAL HIGH (ref 65–99)
Glucose-Capillary: 117 mg/dL — ABNORMAL HIGH (ref 65–99)
Glucose-Capillary: 185 mg/dL — ABNORMAL HIGH (ref 65–99)
Glucose-Capillary: 241 mg/dL — ABNORMAL HIGH (ref 65–99)
Glucose-Capillary: 386 mg/dL — ABNORMAL HIGH (ref 65–99)
Glucose-Capillary: 428 mg/dL — ABNORMAL HIGH (ref 65–99)
Glucose-Capillary: 465 mg/dL — ABNORMAL HIGH (ref 65–99)
Glucose-Capillary: 63 mg/dL — ABNORMAL LOW (ref 65–99)
Glucose-Capillary: 76 mg/dL (ref 65–99)
Glucose-Capillary: 84 mg/dL (ref 65–99)

## 2016-06-14 LAB — HEPATIC FUNCTION PANEL
ALK PHOS: 65 U/L (ref 38–126)
ALT: 14 U/L (ref 14–54)
AST: 40 U/L (ref 15–41)
Albumin: 1.8 g/dL — ABNORMAL LOW (ref 3.5–5.0)
BILIRUBIN INDIRECT: 0.5 mg/dL (ref 0.3–0.9)
Bilirubin, Direct: 0.3 mg/dL (ref 0.1–0.5)
TOTAL PROTEIN: 4.1 g/dL — AB (ref 6.5–8.1)
Total Bilirubin: 0.8 mg/dL (ref 0.3–1.2)

## 2016-06-14 LAB — CBC WITH DIFFERENTIAL/PLATELET
BASOS ABS: 0 10*3/uL (ref 0.0–0.1)
Basophils Relative: 0 %
EOS ABS: 0 10*3/uL (ref 0.0–0.7)
Eosinophils Relative: 0 %
HCT: 30 % — ABNORMAL LOW (ref 36.0–46.0)
HEMOGLOBIN: 10 g/dL — AB (ref 12.0–15.0)
LYMPHS ABS: 0.5 10*3/uL — AB (ref 0.7–4.0)
LYMPHS PCT: 6 %
MCH: 29.5 pg (ref 26.0–34.0)
MCHC: 33.3 g/dL (ref 30.0–36.0)
MCV: 88.5 fL (ref 78.0–100.0)
Monocytes Absolute: 0.4 10*3/uL (ref 0.1–1.0)
Monocytes Relative: 5 %
NEUTROS PCT: 89 %
Neutro Abs: 7.6 10*3/uL (ref 1.7–7.7)
Platelets: 155 10*3/uL (ref 150–400)
RBC: 3.39 MIL/uL — AB (ref 3.87–5.11)
RDW: 16 % — ABNORMAL HIGH (ref 11.5–15.5)
WBC: 8.6 10*3/uL (ref 4.0–10.5)

## 2016-06-14 LAB — URINE CULTURE
CULTURE: NO GROWTH
SPECIAL REQUESTS: NORMAL

## 2016-06-14 LAB — PROCALCITONIN: Procalcitonin: 2.9 ng/mL

## 2016-06-14 LAB — TROPONIN I
TROPONIN I: 8.87 ng/mL — AB (ref <0.031)
TROPONIN I: 6.1 ng/mL — AB (ref <0.031)
Troponin I: 5.62 ng/mL (ref <0.031)
Troponin I: 3.8 ng/mL (ref <0.031)

## 2016-06-14 LAB — PHOSPHORUS
PHOSPHORUS: 3.8 mg/dL (ref 2.5–4.6)
Phosphorus: 4.5 mg/dL (ref 2.5–4.6)
Phosphorus: 3.5 mg/dL (ref 2.5–4.6)

## 2016-06-14 LAB — MAGNESIUM
MAGNESIUM: 1.6 mg/dL — AB (ref 1.7–2.4)
MAGNESIUM: 1.7 mg/dL (ref 1.7–2.4)
Magnesium: 1.5 mg/dL — ABNORMAL LOW (ref 1.7–2.4)

## 2016-06-14 MED ORDER — DEXTROSE 50 % IV SOLN
INTRAVENOUS | Status: AC
  Filled 2016-06-14: qty 50

## 2016-06-14 MED ORDER — DEXTROSE 50 % IV SOLN
15.0000 mL | Freq: Once | INTRAVENOUS | Status: AC
  Administered 2016-06-14: 15 mL via INTRAVENOUS

## 2016-06-14 MED ORDER — ACETAMINOPHEN 325 MG PO TABS
650.0000 mg | ORAL_TABLET | ORAL | Status: DC | PRN
  Administered 2016-06-18 – 2016-06-25 (×6): 650 mg via ORAL
  Filled 2016-06-14 (×6): qty 2

## 2016-06-14 MED ORDER — ONDANSETRON HCL 4 MG/2ML IJ SOLN
4.0000 mg | INTRAMUSCULAR | Status: DC | PRN
  Administered 2016-06-14 – 2016-07-01 (×20): 4 mg via INTRAVENOUS
  Filled 2016-06-14 (×22): qty 2

## 2016-06-14 MED ORDER — GERHARDT'S BUTT CREAM
TOPICAL_CREAM | Freq: Two times a day (BID) | CUTANEOUS | Status: DC
  Administered 2016-06-14: 21:00:00 via TOPICAL
  Administered 2016-06-15 (×2): 1 via TOPICAL
  Administered 2016-06-16 – 2016-06-17 (×2): via TOPICAL
  Administered 2016-06-18: 1 via TOPICAL
  Administered 2016-06-18 – 2016-06-23 (×9): via TOPICAL
  Administered 2016-06-23 – 2016-06-24 (×3): 1 via TOPICAL
  Administered 2016-06-25 – 2016-07-01 (×13): via TOPICAL
  Filled 2016-06-14 (×2): qty 1

## 2016-06-14 MED ORDER — INSULIN ASPART 100 UNIT/ML ~~LOC~~ SOLN
0.0000 [IU] | SUBCUTANEOUS | Status: DC
  Administered 2016-06-14 – 2016-06-15 (×3): 3 [IU] via SUBCUTANEOUS
  Administered 2016-06-15 (×2): 2 [IU] via SUBCUTANEOUS
  Administered 2016-06-15: 5 [IU] via SUBCUTANEOUS
  Administered 2016-06-15: 3 [IU] via SUBCUTANEOUS
  Administered 2016-06-16: 2 [IU] via SUBCUTANEOUS
  Administered 2016-06-16: 1 [IU] via SUBCUTANEOUS
  Administered 2016-06-16: 3 [IU] via SUBCUTANEOUS
  Administered 2016-06-16 – 2016-06-17 (×2): 2 [IU] via SUBCUTANEOUS
  Administered 2016-06-17: 3 [IU] via SUBCUTANEOUS
  Administered 2016-06-17 – 2016-06-18 (×5): 2 [IU] via SUBCUTANEOUS
  Administered 2016-06-18: 5 [IU] via SUBCUTANEOUS
  Administered 2016-06-18: 3 [IU] via SUBCUTANEOUS
  Administered 2016-06-18: 1 [IU] via SUBCUTANEOUS
  Administered 2016-06-18: 3 [IU] via SUBCUTANEOUS
  Administered 2016-06-18: 2 [IU] via SUBCUTANEOUS
  Administered 2016-06-19 (×2): 7 [IU] via SUBCUTANEOUS
  Administered 2016-06-19 (×2): 9 [IU] via SUBCUTANEOUS
  Administered 2016-06-19: 1 [IU] via SUBCUTANEOUS
  Administered 2016-06-20: 2 [IU] via SUBCUTANEOUS
  Administered 2016-06-20: 3 [IU] via SUBCUTANEOUS
  Administered 2016-06-20: 2 [IU] via SUBCUTANEOUS
  Administered 2016-06-20: 5 [IU] via SUBCUTANEOUS
  Administered 2016-06-20: 2 [IU] via SUBCUTANEOUS
  Administered 2016-06-20: 1 [IU] via SUBCUTANEOUS
  Administered 2016-06-21: 3 [IU] via SUBCUTANEOUS
  Administered 2016-06-21 (×2): 5 [IU] via SUBCUTANEOUS
  Administered 2016-06-21: 1 [IU] via SUBCUTANEOUS

## 2016-06-14 MED ORDER — HYDROMORPHONE HCL 1 MG/ML IJ SOLN
0.5000 mg | INTRAMUSCULAR | Status: DC | PRN
  Administered 2016-06-14 (×2): 0.5 mg via INTRAVENOUS
  Filled 2016-06-14 (×2): qty 1
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
## 2016-06-14 NOTE — Progress Notes (Signed)
PULMONARY / CRITICAL CARE MEDICINE   Name: [name] MRN: [id number] DOB: 03-Apr-1951    ADMISSION DATE:  06/13/2016 CONSULTATION DATE:  06/13/2016  REFERRING MD:  Theotis Burrow, M.D. / EDP  CHIEF COMPLAINT:  Acute Encephalopathy  HISTORY OF PRESENT ILLNESS:   65 year old female with PMH as below, which includes DM, CAD, HTN, and recent admission for R femoral neck fracture s/p arthoplasty 6/8. She had suffered several fall prior to the fall that caused the fracture. Post operative course was fairly uncomplicated with the exception of some mild anemia requiring PRBC transfusion and UTI treated with levaquin. She was discharged to [facility] for rehabilitation on 6/12. 6/14 she returned to ER with chief complaint of AMS with associated hypotension. In the emergency department she was noted to be lethargic and confused. She was complaining of abdominal pain. She was hypotensive and hypothermic with elevated WBC. Glucose noted to be markedly elevated, however urine ketones negative. With metabolic acidosis. Lactic acid wnl. Due to persistent hypotension and concern for septic shock PCCM asked to admit.   SUBJECTIVE:  Weaning off pressors.   VITAL SIGNS: BP 117/63 mmHg  Pulse 69  Temp(Src) 98.1 F (36.7 C) (Axillary)  Resp 17  Ht 5\' 3"  (1.6 m)  Wt 60.1 kg (132 lb 7.9 oz)  BMI 23.48 kg/m2  SpO2 100%  HEMODYNAMICS:    VENTILATOR SETTINGS:    INTAKE / OUTPUT: I/O last 3 completed shifts: In: 5104.3 [I.V.:4954.3; IV Piggyback:150] Out: -   PHYSICAL EXAMINATION: General:  Chronically ill appearing female. No distress. Comfortable. Neuro:  Somnolent but arousable, follows some commands, Grossly nonfocal. CN grossly in tact. HEENT:  Dry mucus membranes. No scleral injection or icterus.  Cardiovascular:  Regular rate. Trace edema. Normal S1 & S2. Lungs:  Coarse breath sounds bilaterally. Normal work of breathing on supplemental oxygen. Abdomen:  Soft, non-tender,  non-distended Musculoskeletal:  No acute deformity. No edema  Skin:  Warm & dry. No rash on exposed skin.  LABS:  BMET  Recent Labs Lab 06/13/16 2020 06/14/16 0042 06/14/16 0320  NA 131* 132* 134*  K 3.7 3.4* 3.3*  CL 105 108 108  CO2 13* 15* 15*  BUN 66* 65* 66*  CREATININE 3.24* 3.12* 3.06*  GLUCOSE 671* 496* 372*    Electrolytes  Recent Labs Lab 06/13/16 2020 06/14/16 0042 06/14/16 0320  CALCIUM 7.6* 7.6* 7.6*  MG 1.7 1.7  --   PHOS 5.5* 4.5  --     CBC  Recent Labs Lab 06/10/16 0815 06/13/16 1618 06/14/16 0320  WBC 4.6 9.4 8.6  HGB 11.6* 9.7* 10.0*  HCT 34.3* 31.2* 30.0*  PLT 96* 149* 155    Coag's No results for input(s): APTT, INR in the last 168 hours.  Sepsis Markers  Recent Labs Lab 06/13/16 1630 06/13/16 1933 06/13/16 2146 06/14/16 0320  LATICACIDVEN 1.99 1.39  --   --   PROCALCITON  --   --  2.55 2.90    ABG No results for input(s): PHART, PCO2ART, PO2ART in the last 168 hours.  Liver Enzymes  Recent Labs Lab 06/13/16 1618 06/14/16 0320  AST 44* 40  ALT 17 14  ALKPHOS 78 65  BILITOT 2.3* 0.8  ALBUMIN 2.0* 1.8*    Cardiac Enzymes  Recent Labs Lab 06/13/16 2020 06/14/16 0320  TROPONINI 9.95* 8.87*    Glucose  Recent Labs Lab 06/14/16 0217 06/14/16 0305 06/14/16 0417 06/14/16 0510 06/14/16 0607 06/14/16 0659  GLUCAP 386* 356* 294* 263* 241*  185*    Imaging US Abdomen Complete  06/13/2016  CLINICAL DATA:  Acute onset of generalized abdominal pain. Elevated lipase and bilirubin. Initial encounter. EXAM: ABDOMEN ULTRASOUND COMPLETE COMPARISON:  CT of the abdomen and pelvis performed 03/11/2016, and renal ultrasound performed 02/08/2016 FINDINGS: Gallbladder: No gallstones or wall thickening visualized. Trace sludge is noted within the gallbladder. No sonographic Murphy sign noted by sonographer. Common bile duct: Diameter: 0.3 cm, within normal limits in caliber. Liver: No focal lesion identified. Within normal  limits in parenchymal echogenicity. Trace fluid is noted adjacent to the gallbladder fossa. IVC: No abnormality visualized. Pancreas: Visualized portion unremarkable. Spleen: Size and appearance within normal limits. Right Kidney: Length: 11.1 cm. Increased renal parenchymal echogenicity noted. Trace right-sided perinephric fluid is seen. No mass or hydronephrosis visualized. Left Kidney: Length: 10.0 cm. Increased renal parenchymal echogenicity noted. No mass or hydronephrosis visualized. Abdominal aorta: No aneurysm visualized. Scattered calcification is seen along the abdominal aorta. Other findings: Small bilateral pleural effusions are seen. IMPRESSION: 1. No acute abnormality seen within the abdomen. 2. Trace sludge within the gallbladder. Gallbladder otherwise unremarkable. 3. Increased renal parenchymal echogenicity likely reflects medical renal disease. 4. Trace fluid noted adjacent to the gallbladder fossa. 5. Trace right-sided perinephric fluid seen. 6. Small bilateral pleural effusions noted. 7. Scattered calcification along the abdominal aorta. Electronically Signed   By: Garald Balding M.D.   On: 06/13/2016 23:36   Dg Chest Port 1 View  06/13/2016  CLINICAL DATA:  Altered mental status with abdominal pain for 2 days. History of diabetes and anemia. EXAM: PORTABLE CHEST 1 VIEW COMPARISON:  06/05/2016 and 02/07/2016 radiographs. FINDINGS: 1729 hours. There are lower lung volumes. Allowing for this, the heart size and mediastinal contours are stable. There are new left-greater-than-right perihilar and lower lobe airspace opacities with a probable small left pleural effusion. No evidence of pneumothorax. The bones appear unchanged. IMPRESSION: New lower lung volumes with left-greater-than-right perihilar and lower lobe airspace opacities. These findings are nonspecific and may reflect asymmetric edema, although are worrisome for possible aspiration. Electronically Signed   By: Richardean Sale M.D.   On:  06/13/2016 17:39   Dg Abd Portable 1v  06/13/2016  CLINICAL DATA:  Altered mental status with abdominal pain for 2 days. History of diabetes and anemia. EXAM: PORTABLE ABDOMEN - 1 VIEW COMPARISON:  Radiographs 06/06/2016.  CT 03/11/2016. FINDINGS: 1728 hours. Prominent stool is again noted within the rectum. The bowel gas pattern is nonobstructive. There is no evidence of bowel wall thickening or free air. Interval right hip hemiarthroplasty and scattered vascular calcifications are noted. IMPRESSION: No acute abdominal findings. Prominent stool in the rectum, similar to prior study. Electronically Signed   By: Richardean Sale M.D.   On: 06/13/2016 17:41    STUDIES:  TTE (06/24/15): EF 55-60%. LA & RA normal in size. RV normal in size and function. No aortic stenosis or regurgitation. Mild mitral regurgitation. Mild tricuspid regurgitation. Port CXR 6/14: Bilateral hilar fullness and lower lobe opacification with silhouetting of the left hemidiaphragm. Port Abd X-ray 6/14:  No free air under diaphragm. Right hip hemiarthroplasty. Bowel gas pattern nonobstructive.  MICROBIOLOGY: MRSA PCR 6/14 >> Blood Ctx x2 6/14 >> Urine Ctx 6/14 >>  ANTIBIOTICS: Tressie Ellis 6/14 >> Vancomycin 6/14 >>  SIGNIFICANT EVENTS: 6/14 - Admit  LINES/TUBES: PIV x3  DISCUSSION:  65 year old female presenting with shock which is gradually resolving and diabetic ketoacidosis. Suspect underlying sepsis. Clinical status improving with correction of acidosis and hypotension. Elevated cardiac biomarkers suspicious  for demand ischemia. Continuing broad-spectrum antibiotics while awaiting culture results.   ASSESSMENT / PLAN:  PULMONARY A: ?HCAP  P:   abx as above  Pulmonary hygiene  Continuous Pulse Ox Supplemental O2   CARDIOVASCULAR A:  Shock - Likely combination of hypovolemia & sepsis - improving.  Elevated Troponin I - Likely due to shock & acidosis. H/O Hyperlipidemia H/O HTN H/O CAD P:  Monitoring on  telemetry Vitals per unit protocol Echo pending  Trending Troponin I q6hr Weaning Levophed to off  Lactate wnl  Treatment DKA  Volume resuscitation   RENAL A:   Acute on Chronic Renal Failure - Baseline creatinine 1.9-2.0. Metabolic Acidosis - From DKA.  P:   Trending UOP Monitoring electrolytes & renal function daily Replacing electrolytes as indicated   GASTROINTESTINAL A:   Abdominal Pain - Possible pancreatitis with elevated Lipase. Bilirubin elevated. H/O GERD  P:   NPO Abd [place] pending Repeat LFTs in AM  HEMATOLOGIC/ONCOLOGIC A:   Anemia - Near baseline anemia. No signs of active bleeding. Thrombocytopenia - Slightly above baseline due to hemoconcentration. H/O MGUS  P:  Trending cell counts daily w/ CBC SCDs Heparin [place] q8hr  INFECTIOUS A:   Sepsis - UTI vs HCAP vs Intra-Abdominal Source.  P:   Empiric Vancomycin & Tressie Ellis  Trending Procalcitonin per algorithm F/u culture data  Urine Strep & Legionella Antigen pending   ENDOCRINE A:   DKA v HHS-- NO ketones, did initially have anion gap acidosis, now resolved.   DM   P:   Continue insulin gtt for now with CBG's >400 - improving Volume resuscitation  Check beta-hydroxybutyric acid' q4 BMET  NEUROLOGIC A:   Acute Encephalopathy - Likely metabolic. Improving. H/O Depression H/O TIA  P:   Monitor closely in ICU.   FAMILY  - Updates: No family at bedside 6/15.   - Inter-disciplinary family meet or Palliative Care meeting due by:  6/21  Nickolas Madrid, NP 06/14/2016  9:43 AM Pager: [phone] or [phone]  Attending Note:  65 year old female with extensive PMH including diabetes who presents to the hospital with hyperglycemia, AMS and hypotension.  Responding to levophed and volume nicely.  DKA protocol was started.  Mental status remains very poor but reportedly improving on exam.  I reviewed CXR myself, no acute disease.  I reviewed CXR myself, low volume.  Will continue to  treat as DKA for now.  Will need diabetes coordinator consult (will place) and once off pressors for a few hours will consider transfer to SDU and to [facility] service with PCCM off 6/16.  The patient is critically ill with multiple organ systems failure and requires high complexity decision making for assessment and support, frequent evaluation and titration of therapies, application of advanced monitoring technologies and extensive interpretation of multiple databases.   Critical Care Time devoted to patient care services described in this note is  35  Minutes. This time reflects time of care of this signee Dr Jennet Maduro. This critical care time does not reflect procedure time, or teaching time or supervisory time of PA/NP/Med student/Med Resident etc but could involve care discussion time.  Rush Farmer, M.D. [facility] Pulmonary/Critical Care Medicine. Pager: [phone]. After hours pager: [phone].
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
## 2016-06-14 NOTE — Progress Notes (Signed)
14 fr foley placed per MD order/protocol d/t acute urinary retention. Pt tolerated procedure well. Arville Lime, NT present as second person during insertion. Peri care care with soap and water provided prior to insertion and sterile procedure maintained throughout. Will continue to monitor.
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
## 2016-06-14 NOTE — Progress Notes (Signed)
eLink Physician-Brief Progress Note Patient Name: JAIYA ACEVES DOB: Sep 20, 1951 MRN: [id number]   Date of Service  06/14/2016  HPI/Events of Note  Elevated troponins. No acute ECG changes. Suspect demand ischemia, was already on plavix at time of admit. H/o chronic anemia.   eICU Interventions  Will hold off on heparin at this time, continue to monitor.         Laverle Hobby 06/14/2016, 12:53 AM

## 2016-06-15 ENCOUNTER — Inpatient Hospital Stay (HOSPITAL_COMMUNITY): Payer: Commercial Managed Care - HMO

## 2016-06-15 DIAGNOSIS — E1310 Other specified diabetes mellitus with ketoacidosis without coma: Secondary | ICD-10-CM

## 2016-06-15 DIAGNOSIS — I340 Nonrheumatic mitral (valve) insufficiency: Secondary | ICD-10-CM

## 2016-06-15 DIAGNOSIS — E872 Acidosis, unspecified: Secondary | ICD-10-CM | POA: Diagnosis present

## 2016-06-15 DIAGNOSIS — E1110 Type 2 diabetes mellitus with ketoacidosis without coma: Secondary | ICD-10-CM | POA: Diagnosis present

## 2016-06-15 DIAGNOSIS — A419 Sepsis, unspecified organism: Secondary | ICD-10-CM

## 2016-06-15 DIAGNOSIS — N179 Acute kidney failure, unspecified: Secondary | ICD-10-CM | POA: Diagnosis present

## 2016-06-15 LAB — CBC
HCT: 30.4 % — ABNORMAL LOW (ref 36.0–46.0)
Hemoglobin: 10.2 g/dL — ABNORMAL LOW (ref 12.0–15.0)
MCH: 29.4 pg (ref 26.0–34.0)
MCHC: 33.6 g/dL (ref 30.0–36.0)
MCV: 87.6 fL (ref 78.0–100.0)
PLATELETS: 176 10*3/uL (ref 150–400)
RBC: 3.47 MIL/uL — AB (ref 3.87–5.11)
RDW: 16.7 % — ABNORMAL HIGH (ref 11.5–15.5)
WBC: 8.6 10*3/uL (ref 4.0–10.5)

## 2016-06-15 LAB — ECHOCARDIOGRAM COMPLETE
CHL CUP TV REG PEAK VELOCITY: 317 cm/s
E/e' ratio: 13.47
EWDT: 151 ms
FS: 28 % (ref 28–44)
HEIGHTINCHES: 63 in
IVS/LV PW RATIO, ED: 0.9
LA ID, A-P, ES: 36 mm
LADIAMINDEX: 2.22 cm/m2
LAVOLA4C: 40 mL
LEFT ATRIUM END SYS DIAM: 36 mm
LV e' LATERAL: 6.14 cm/s
LVEEAVG: 13.47
LVEEMED: 13.47
LVOT area: 2.54 cm2
LVOT diameter: 18 mm
MV Dec: 151
MV pk E vel: 82.7 m/s
MVPG: 3 mmHg
MVPKAVEL: 105 m/s
PW: 10.8 mm — AB (ref 0.6–1.1)
TDI e' lateral: 6.14
TDI e' medial: 6.25
TRMAXVEL: 317 cm/s
WEIGHTICAEL: 2119.94 [oz_av]

## 2016-06-15 LAB — POCT I-STAT 3, ART BLOOD GAS (G3+)
Acid-base deficit: 9 mmol/L — ABNORMAL HIGH (ref 0.0–2.0)
Acid-base deficit: 9 mmol/L — ABNORMAL HIGH (ref 0.0–2.0)
BICARBONATE: 14.5 meq/L — AB (ref 20.0–24.0)
Bicarbonate: 16.3 mEq/L — ABNORMAL LOW (ref 20.0–24.0)
O2 SAT: 81 %
O2 Saturation: 81 %
PCO2 ART: 25.5 mmHg — AB (ref 35.0–45.0)
PO2 ART: 45 mmHg — AB (ref 80.0–100.0)
TCO2: 15 mmol/L (ref 0–100)
TCO2: 17 mmol/L (ref 0–100)
pCO2 arterial: 31.7 mmHg — ABNORMAL LOW (ref 35.0–45.0)
pH, Arterial: 7.363 (ref 7.350–7.450)
pH, Arterial: 7.32 — ABNORMAL LOW (ref 7.350–7.450)
pO2, Arterial: 48 mmHg — ABNORMAL LOW (ref 80.0–100.0)

## 2016-06-15 LAB — BASIC METABOLIC PANEL
Anion gap: 7 (ref 5–15)
BUN: 61 mg/dL — ABNORMAL HIGH (ref 6–20)
CALCIUM: 7.9 mg/dL — AB (ref 8.9–10.3)
CO2: 16 mmol/L — ABNORMAL LOW (ref 22–32)
Chloride: 112 mmol/L — ABNORMAL HIGH (ref 101–111)
Creatinine, Ser: 2.91 mg/dL — ABNORMAL HIGH (ref 0.44–1.00)
GFR calc Af Amer: 18 mL/min — ABNORMAL LOW (ref >60)
GFR, EST NON AFRICAN AMERICAN: 16 mL/min — AB (ref >60)
Glucose, Bld: 254 mg/dL — ABNORMAL HIGH (ref 65–99)
POTASSIUM: 3.6 mmol/L (ref 3.5–5.1)
SODIUM: 135 mmol/L (ref 135–145)

## 2016-06-15 LAB — GLUCOSE, CAPILLARY
GLUCOSE-CAPILLARY: 215 mg/dL — AB (ref 65–99)
Glucose-Capillary: 267 mg/dL — ABNORMAL HIGH (ref 65–99)
Glucose-Capillary: 234 mg/dL — ABNORMAL HIGH (ref 65–99)
Glucose-Capillary: 167 mg/dL — ABNORMAL HIGH (ref 65–99)
Glucose-Capillary: 167 mg/dL — ABNORMAL HIGH (ref 65–99)

## 2016-06-15 LAB — PHOSPHORUS
PHOSPHORUS: 3.6 mg/dL (ref 2.5–4.6)
Phosphorus: 4.2 mg/dL (ref 2.5–4.6)
Phosphorus: 3.4 mg/dL (ref 2.5–4.6)

## 2016-06-15 LAB — MAGNESIUM
MAGNESIUM: 1.6 mg/dL — AB (ref 1.7–2.4)
MAGNESIUM: 1.6 mg/dL — AB (ref 1.7–2.4)
Magnesium: 1.6 mg/dL — ABNORMAL LOW (ref 1.7–2.4)

## 2016-06-15 LAB — PROCALCITONIN: Procalcitonin: 2.18 ng/mL

## 2016-06-15 LAB — TROPONIN I
TROPONIN I: 2.85 ng/mL — AB (ref <0.031)
TROPONIN I: 2.64 ng/mL — AB (ref <0.031)

## 2016-06-15 LAB — HEMOGLOBIN A1C
Hgb A1c MFr Bld: 6.8 % — ABNORMAL HIGH (ref 4.8–5.6)
Mean Plasma Glucose: 148 mg/dL

## 2016-06-15 LAB — STREP PNEUMONIAE URINARY ANTIGEN: Strep Pneumo Urinary Antigen: NEGATIVE

## 2016-06-15 MED ORDER — LORAZEPAM 2 MG/ML IJ SOLN
0.5000 mg | Freq: Once | INTRAMUSCULAR | Status: AC
  Administered 2016-06-15: 0.5 mg via INTRAVENOUS
  Filled 2016-06-15: qty 1

## 2016-06-15 MED ORDER — CETYLPYRIDINIUM CHLORIDE 0.05 % MT LIQD
7.0000 mL | Freq: Two times a day (BID) | OROMUCOSAL | Status: DC
  Administered 2016-06-15: 7 mL via OROMUCOSAL

## 2016-06-15 MED ORDER — FUROSEMIDE 10 MG/ML IJ SOLN
40.0000 mg | Freq: Once | INTRAMUSCULAR | Status: AC
  Administered 2016-06-15: 40 mg via INTRAVENOUS
  Filled 2016-06-15: qty 4

## 2016-06-15 MED ORDER — FUROSEMIDE 10 MG/ML IJ SOLN
20.0000 mg | Freq: Once | INTRAMUSCULAR | Status: AC
  Administered 2016-06-15: 20 mg via INTRAVENOUS
  Filled 2016-06-15: qty 2

## 2016-06-15 MED ORDER — HEPARIN SODIUM (PORCINE) 5000 UNIT/ML IJ SOLN
5000.0000 [IU] | Freq: Three times a day (TID) | INTRAMUSCULAR | Status: DC
  Administered 2016-06-15 – 2016-06-28 (×38): 5000 [IU] via SUBCUTANEOUS
  Filled 2016-06-15 (×36): qty 1

## 2016-06-15 NOTE — Progress Notes (Addendum)
PULMONARY / CRITICAL CARE MEDICINE   Name: [name] MRN: [id number] DOB: 11/16/1951    ADMISSION DATE:  06/13/2016 CONSULTATION DATE:  06/13/2016  REFERRING MD:  Theotis Burrow, M.D. / EDP  CHIEF COMPLAINT:  Acute Encephalopathy  HISTORY OF PRESENT ILLNESS:   65 year old female with PMH as below, which includes DM, CAD, HTN, and recent admission for R femoral neck fracture s/p arthoplasty 6/8. She had suffered several fall prior to the fall that caused the fracture. Post operative course was fairly uncomplicated with the exception of some mild anemia requiring PRBC transfusion and UTI treated with levaquin. She was discharged to [facility] for rehabilitation on 6/12. 6/14 she returned to ER with chief complaint of AMS with associated hypotension. In the emergency department she was noted to be lethargic and confused. She was complaining of abdominal pain. She was hypotensive and hypothermic with elevated WBC. Glucose noted to be markedly elevated, however urine ketones negative. With metabolic acidosis. Lactic acid wnl. Due to persistent hypotension and concern for septic shock PCCM asked to admit.   SUBJECTIVE:  Off pressors x 24-48 hrs.  On and off drowsiness.  6L (+) IVF. More WOB but still comfortable.  Elevated CBG but on D5W  VITAL SIGNS: BP 135/117 mmHg  Pulse 85  Temp(Src) 98.4 F (36.9 C) (Oral)  Resp 21  Ht 5\' 3"  (1.6 m)  Wt 60.1 kg (132 lb 7.9 oz)  BMI 23.48 kg/m2  SpO2 94%  HEMODYNAMICS:    VENTILATOR SETTINGS:    INTAKE / OUTPUT: I/O last 3 completed shifts: In: 4261.8 [I.V.:4011.8; IV Piggyback:250] Out: 990 [Urine:990]  PHYSICAL EXAMINATION: General:  Chronically ill appearing female. mild distress. Comfortable. Neuro:  Somnolent but arousable, follows some commands, Grossly nonfocal. CN grossly in tact. HEENT:  Dry mucus membranes. No scleral injection or icterus.  Cardiovascular:  Regular rate. Trace edema. Normal S1 & S2. Lungs:  Coarse  breath sounds bilaterally. Normal work of breathing on supplemental oxygen. Abdomen:  Soft, non-tender, non-distended Musculoskeletal:  No acute deformity. Gr 1 edema  Skin:  Warm & dry. No rash on exposed skin.  LABS:  BMET  Recent Labs Lab 06/14/16 0320 06/14/16 0939 06/15/16 0203  NA 134* 135 135  K 3.3* 3.1* 3.6  CL 108 109 112*  CO2 15* 16* 16*  BUN 66* 64* 61*  CREATININE 3.06* 2.92* 2.91*  GLUCOSE 372* 139* 254*    Electrolytes  Recent Labs Lab 06/14/16 0320 06/14/16 0939 06/14/16 1415 06/15/16 0057 06/15/16 0203 06/15/16 0624  CALCIUM 7.6* 7.9*  --   --  7.9*  --   MG  --  1.5* 1.6* 1.6*  --  1.6*  PHOS  --  3.5 3.8 4.2  --  3.6    CBC  Recent Labs Lab 06/13/16 1618 06/14/16 0320 06/15/16 0203  WBC 9.4 8.6 8.6  HGB 9.7* 10.0* 10.2*  HCT 31.2* 30.0* 30.4*  PLT 149* 155 176    Coag's No results for input(s): APTT, INR in the last 168 hours.  Sepsis Markers  Recent Labs Lab 06/13/16 1630 06/13/16 1933 06/13/16 2146 06/14/16 0320 06/15/16 0203  LATICACIDVEN 1.99 1.39  --   --   --   PROCALCITON  --   --  2.55 2.90 2.18    ABG No results for input(s): PHART, PCO2ART, PO2ART in the last 168 hours.  Liver Enzymes  Recent Labs Lab 06/13/16 1618 06/14/16 0320  AST 44* 40  ALT 17 14  ALKPHOS 78  65  BILITOT 2.3* 0.8  ALBUMIN 2.0* 1.8*    Cardiac Enzymes  Recent Labs Lab 06/14/16 2030 06/15/16 0203 06/15/16 0624  TROPONINI 3.80* 2.85* 2.64*    Glucose  Recent Labs Lab 06/14/16 1327 06/14/16 1409 06/14/16 1543 06/14/16 1914 06/14/16 2323 06/15/16 0336  GLUCAP 91 84 89 210* 217* 267*    Imaging No results found.  STUDIES:  TTE (06/24/15): EF 55-60%. LA & RA normal in size. RV normal in size and function. No aortic stenosis or regurgitation. Mild mitral regurgitation. Mild tricuspid regurgitation. Port CXR 6/14: Bilateral hilar fullness and lower lobe opacification with silhouetting of the left hemidiaphragm. Port  Abd X-ray 6/14:  No free air under diaphragm. Right hip hemiarthroplasty. Bowel gas pattern nonobstructive.  MICROBIOLOGY: MRSA PCR 6/14 >> (-) Blood Ctx x2 6/14 >> (-) Urine Ctx 6/14 >> (-)  ANTIBIOTICS: Tressie Ellis 6/14 >> Vancomycin 6/14 >>  SIGNIFICANT EVENTS: 6/14 - Admit  LINES/TUBES: PIV x3  DISCUSSION:  65 year old female presenting with shock which is gradually resolving and diabetic ketoacidosis. Suspect underlying sepsis. Clinical status improving with correction of acidosis and hypotension. Elevated cardiac biomarkers suspicious for demand ischemia. Continuing broad-spectrum antibiotics while awaiting culture results.   Also, pt with AKI on top of CKD with metabolic acidosis.   ASSESSMENT / PLAN:  PULMONARY A: ?HCAP  L base Concern now for pulm edema  P:   abx as above  Pulmonary hygiene  Continuous Pulse Ox Supplemental O2  CXR now > will deescalate abx depending on CXR if its better.   CARDIOVASCULAR A:  Shock - Likely combination of hypovolemia & sepsis - improving.  Concern for pulm edema Elevated Troponin I - Likely due to shock & acidosis. H/O Hyperlipidemia H/O HTN H/O CAD P:  Monitoring on telemetry Vitals per unit protocol Echo pending > none seen in system since admit. Will reorder today.  Hold off on IVF > will resume if CXR not worse.    RENAL A:   Acute on Chronic Renal Failure - Baseline creatinine 1.9-2.0. CKD st IV Metabolic Acidosis - From DKA and AKI  P:   Trending UOP Monitoring electrolytes & renal function daily Replacing electrolytes as indicated Will d/c IVF 2/2 concern for pulm edema   GASTROINTESTINAL A:   Abdominal Pain - Possible pancreatitis with elevated Lipase. Bilirubin elevated. H/O GERD  P:   NPO. May try clears if more awake.    HEMATOLOGIC/ONCOLOGIC A:   Anemia - Near baseline anemia. No signs of active bleeding. Thrombocytopenia - Slightly above baseline due to hemoconcentration. H/O MGUS  P:   Trending cell counts daily w/ CBC SCDs Heparin [place] q8hr  INFECTIOUS A:   Sepsis - Possible HCAP. Urine and blood (-) so far.   P:   Empiric Vancomycin & [place]az > if CXR is better, will deescalate abx.  Trending Procalcitonin per algorithm. Still elevated on 6/16 F/u culture data  Urine Strep (-);  Legionella Antigen pending   ENDOCRINE A:   DKA v HHS-- NO ketones, did initially have anion gap acidosis, now resolved.   DM   P:   CBG still in 200 mg% range but pt is on D5 IVF and has CKD. Will d/c D5 IVF and observe CBG. Will keep on sliding scale for now.   NEUROLOGIC A:   Acute Encephalopathy - Likely metabolic. Improving. H/O Depression H/O TIA  P:   Monitor closely in ICU. (-) lateralizing signs.    FAMILY  - Updates: No family at bedside 6/16.   -  Inter-disciplinary family meet or Palliative Care meeting due by:  6/21  Slowly improving. Will await rpt CXR and abg prior to SDU transfer. Cont abx. Will d/c IVF. Still with waxing and waning sensorium.   Addendum 06/15/2016  CXR with B infiltrates concerning for pulm edema +/- HCAP. Pt is 6L (+). Will d/c IVF. Lasix 20 mg IV x 1. Bipap prn.  ABG (mixed venous) 7.36/25/45 on RA. Comfortable. 146/90, 90, 20, 92% on 2L. Transfer to SDU. Will be on [facility] service in am. I discussed the case with Dr. Dia Crawford at 6/16 12 noon.    Monica Becton, MD 06/15/2016, 9:47 AM [place] [facility] Pager [phone] After 3 pm or if no answer, call [phone]

## 2016-06-15 NOTE — Progress Notes (Signed)
eLink Physician-Brief Progress Note Patient Name: CLARY DOBSON DOB: 08-31-1951 MRN: [id number]   Date of Service  06/15/2016  HPI/Events of Note  Progressive hypoxia.  RN notes crackles on chest exam.   eICU Interventions  Will give lasix 40 mg IV x one.     Intervention Category Major Interventions: Other:  Samyra Limb 06/15/2016, 7:43 PM

## 2016-06-15 NOTE — Progress Notes (Signed)
   06/15/16 1000  Clinical Encounter Type  Visited With Patient  Visit Type Other (Comment)  Referral From Chaplain  Spiritual Encounters  Spiritual Needs Emotional  Stress Factors  Patient Stress Factors Exhausted;Health changes;Loss of control;Major life changes  Chaplain completed consult visit, copy of advanced directives left, patient preferred to have family present. Advised patient and nurse to contact spiritual care office at a time when all parties are present to complete. Provided prayer. Follow up to be made.

## 2016-06-15 NOTE — Progress Notes (Signed)
[facility] Progress Note Patient Name: [name] DOB: [birth year] MRN: [id number]   Date of Service  06/15/2016  HPI/Events of Note  Call from bedside nurse expressing concern about patient's agitation and resp status.  PCXR this AM significantly worse.  Received lasix and is net negative over 1 liter.  Patient also agitated not keeping O2 on with drop in sats.  O2 requirements have increased to partial NRB.  Current RR of 33 with sats of 92%, HR of 99 and BP of 162/70 (96).  Patient is on daily benzos and SSRI.  She has not taken these since hospitalization.  eICU Interventions  Plan: Check ABG Continue with oxygen therapy Ativan 0.5 mg IV over concern re: benzo withdrawal     Intervention Category Major Interventions: Delirium, psychosis, severe agitation - evaluation and management Intermediate Interventions: Respiratory distress - evaluation and management  Brittley Regner 06/15/2016, 10:50 PM

## 2016-06-15 NOTE — Plan of Care (Signed)
Problem: Respiratory: Goal: Respiratory status will improve Outcome: Not Progressing Pt on 1 L [place] this morning, now on partial non-rebreather. Dr. Halford Chessman aware and 40 mg IV lasix ordered. Will continue to monitor and titrate oxygen as able.

## 2016-06-15 NOTE — Progress Notes (Signed)
[facility] Progress Note Patient Name: LYLIAH LAVOY DOB: 1951/07/31 MRN: [id number]   Date of Service  06/15/2016  HPI/Events of Note  ABG results on patient with resp distess shows preserved pH but hypoxia.  Nurse reports range of sats are 85 to 94%.  Currently 95% on partial NRB.  PH 7.3/32/46/16  eICU Interventions  Plan: Now that patient appears calmer will attempt BiPAP     Intervention Category Intermediate Interventions: Respiratory distress - evaluation and management  DETERDING,ELIZABETH 06/15/2016, 11:38 PM

## 2016-06-15 NOTE — Progress Notes (Signed)
Inpatient Diabetes Program Recommendations  AACE/ADA: New Consensus Statement on Inpatient Glycemic Control (2015)  Target Ranges:  Prepandial:   less than 140 mg/dL      Peak postprandial:   less than 180 mg/dL (1-2 hours)      Critically ill patients:  140 - 180 mg/dL  Results for [name], [name] (MRN [id number]) as of 06/15/2016 08:33  Ref. Range 06/14/2016 09:12 06/14/2016 10:30 06/14/2016 11:08 06/14/2016 12:10 06/14/2016 13:13 06/14/2016 13:27 06/14/2016 14:09 06/14/2016 15:43 06/14/2016 19:14 06/14/2016 23:23 06/15/2016 03:36  Glucose-Capillary Latest Ref Range: 65-99 mg/dL 151 (H) 117 (H) 100 (H) 76 63 (L) 91 84 89 210 (H) 217 (H) 267 (H)  Results for [name], [name] (MRN [id number]) as of 06/15/2016 08:33  Ref. Range 06/13/2016 16:18  Glucose Latest Ref Range: 65-99 mg/dL 743 (HH)    Review of Glycemic Control  Outpatient Diabetes medications: Humalog 3 units TID with meals Current orders for Inpatient glycemic control: Novolog 0-9 units Q4H  Inpatient Diabetes Program Recommendations: Insulin - Basal: Please consider ordering Lantus 5 units Q24H. Insulin - Meal Coverage: Once diet is resumed, may want to consider ordering Novolog 2 units TID with meals for meal coverage.  NOTE: In reviewing chart, noted patient was hospitalized from 06/05/16 to 06/11/16 and during that hospitalization patient had hypoglycemia on low dose Lantus so patient was ordered Novolog 3 units TID with meals for meal coverage and Novolog 0-9 units TID with meals and Lantus was discontinued.  In reviewing current glucose trends, it appears patient will need low dose basal insulin. Recommend ordering Lantus 5 units Q24H starting now along with Novolog 0-9 units Q4H correction scale. Once diet is resumed, may want to consider ordering Novolog 2 units TID with meals for meal coverage.  Thanks, Barnie Alderman, RN, MSN, CDE Diabetes Coordinator Inpatient Diabetes Program [phone] (Team Pager from [place] to  [place]) [phone] ([place] office) [phone] [facility] office) [phone] [facility] office)

## 2016-06-15 NOTE — Care Management Important Message (Signed)
Important Message  Patient Details  Name: [name] MRN: [id number] Date of Birth: [birth year]   Medicare Important Message Given:  Yes    [name] May 06/15/2016, 12:07 PM

## 2016-06-16 DIAGNOSIS — J9601 Acute respiratory failure with hypoxia: Secondary | ICD-10-CM

## 2016-06-16 DIAGNOSIS — R4182 Altered mental status, unspecified: Secondary | ICD-10-CM

## 2016-06-16 DIAGNOSIS — E876 Hypokalemia: Secondary | ICD-10-CM

## 2016-06-16 LAB — GLUCOSE, CAPILLARY
GLUCOSE-CAPILLARY: 118 mg/dL — AB (ref 65–99)
GLUCOSE-CAPILLARY: 163 mg/dL — AB (ref 65–99)
GLUCOSE-CAPILLARY: 201 mg/dL — AB (ref 65–99)
GLUCOSE-CAPILLARY: 219 mg/dL — AB (ref 65–99)
Glucose-Capillary: 135 mg/dL — ABNORMAL HIGH (ref 65–99)
Glucose-Capillary: 102 mg/dL — ABNORMAL HIGH (ref 65–99)
Glucose-Capillary: 177 mg/dL — ABNORMAL HIGH (ref 65–99)

## 2016-06-16 LAB — MAGNESIUM
MAGNESIUM: 2.7 mg/dL — AB (ref 1.7–2.4)
Magnesium: 1.6 mg/dL — ABNORMAL LOW (ref 1.7–2.4)
Magnesium: 2.4 mg/dL (ref 1.7–2.4)

## 2016-06-16 LAB — BASIC METABOLIC PANEL
Anion gap: 8 (ref 5–15)
BUN: 55 mg/dL — AB (ref 6–20)
CALCIUM: 8.3 mg/dL — AB (ref 8.9–10.3)
CO2: 19 mmol/L — ABNORMAL LOW (ref 22–32)
CREATININE: 2.6 mg/dL — AB (ref 0.44–1.00)
Chloride: 113 mmol/L — ABNORMAL HIGH (ref 101–111)
GFR, EST AFRICAN AMERICAN: 21 mL/min — AB (ref >60)
GFR, EST NON AFRICAN AMERICAN: 18 mL/min — AB (ref >60)
Glucose, Bld: 210 mg/dL — ABNORMAL HIGH (ref 65–99)
Potassium: 3.2 mmol/L — ABNORMAL LOW (ref 3.5–5.1)
SODIUM: 140 mmol/L (ref 135–145)

## 2016-06-16 LAB — LEGIONELLA PNEUMOPHILA SEROGP 1 UR AG: L. pneumophila Serogp 1 Ur Ag: NEGATIVE

## 2016-06-16 LAB — PHOSPHORUS
PHOSPHORUS: 3.4 mg/dL (ref 2.5–4.6)
PHOSPHORUS: 4.1 mg/dL (ref 2.5–4.6)
Phosphorus: 3.5 mg/dL (ref 2.5–4.6)

## 2016-06-16 MED ORDER — METOLAZONE 5 MG PO TABS
5.0000 mg | ORAL_TABLET | Freq: Every day | ORAL | Status: AC
  Administered 2016-06-16: 5 mg via ORAL
  Filled 2016-06-16: qty 1

## 2016-06-16 MED ORDER — POTASSIUM CHLORIDE 10 MEQ/100ML IV SOLN
10.0000 meq | INTRAVENOUS | Status: AC
  Administered 2016-06-16 (×4): 10 meq via INTRAVENOUS
  Filled 2016-06-16 (×5): qty 100

## 2016-06-16 MED ORDER — DEXTROSE 5 % IV SOLN
8.0000 mg/h | INTRAVENOUS | Status: DC
  Administered 2016-06-16: 8 mg/h via INTRAVENOUS
  Filled 2016-06-16: qty 25

## 2016-06-16 MED ORDER — LABETALOL HCL 5 MG/ML IV SOLN
10.0000 mg | INTRAVENOUS | Status: DC | PRN
  Administered 2016-06-16 – 2016-06-18 (×6): 10 mg via INTRAVENOUS
  Filled 2016-06-16 (×6): qty 4

## 2016-06-16 MED ORDER — LORAZEPAM 2 MG/ML IJ SOLN
0.5000 mg | INTRAMUSCULAR | Status: DC | PRN
  Administered 2016-06-16: 0.5 mg via INTRAVENOUS
  Administered 2016-06-17 (×2): 1 mg via INTRAVENOUS
  Filled 2016-06-16 (×3): qty 1

## 2016-06-16 MED ORDER — MAGNESIUM SULFATE 4 GM/100ML IV SOLN
4.0000 g | Freq: Once | INTRAVENOUS | Status: AC
  Administered 2016-06-16: 4 g via INTRAVENOUS
  Filled 2016-06-16: qty 100

## 2016-06-16 MED ORDER — CHLORHEXIDINE GLUCONATE 0.12 % MT SOLN
15.0000 mL | Freq: Two times a day (BID) | OROMUCOSAL | Status: DC
  Administered 2016-06-16 – 2016-07-04 (×33): 15 mL via OROMUCOSAL
  Filled 2016-06-16 (×30): qty 15

## 2016-06-16 MED ORDER — CETYLPYRIDINIUM CHLORIDE 0.05 % MT LIQD
7.0000 mL | Freq: Two times a day (BID) | OROMUCOSAL | Status: DC
  Administered 2016-06-16 – 2016-07-03 (×23): 7 mL via OROMUCOSAL

## 2016-06-16 NOTE — Progress Notes (Signed)
Pharmacy Antibiotic Note  BENIGNA CENTRELLA is a 65 y.o. female admitted on 06/13/2016 with sepsis.  Pharmacy has been consulted for vancomycin and ceftazidime dosing. Received vancomycin 1g *1 in the ED.  Plan: Vancomycin 500 IV every 24 hours.  Goal trough 15-20 mcg/mL. This patient's current antibiotics will be continued without adjustments. Ceftazidime 2g every 24 hours  Follow renal function closely VT at [facility] if continued  Height: 5\' 3"  (160 cm) Weight: 132 lb 7.9 oz (60.1 kg) IBW/kg (Calculated) : 52.4  Temp (24hrs), Avg:99.2 F (37.3 C), Min:98 F (36.7 C), Max:100.9 F (38.3 C)   Recent Labs Lab 06/10/16 0815 06/13/16 1618 06/13/16 1630 06/13/16 1933  06/14/16 0042 06/14/16 0320 06/14/16 0939 06/15/16 0203 06/16/16 0005  WBC 4.6 9.4  --   --   --   --  8.6  --  8.6  --   CREATININE 1.94* 3.82*  --   --   < > 3.12* 3.06* 2.92* 2.91* 2.60*  LATICACIDVEN  --   --  1.99 1.39  --   --   --   --   --   --   < > = values in this interval not displayed.  Estimated Creatinine Clearance: 17.6 mL/min (by C-G formula based on Cr of 2.6).    Allergies  Allergen Reactions  . Codeine Other (See Comments)    Noted as an allergy on MAR  . Hydralazine Hcl Swelling  . Bisphosphonates Rash  . Codeine Phosphate Nausea And Vomiting and Rash  . Penicillins Itching, Rash and Other (See Comments)    Has patient had a PCN reaction causing immediate rash, facial/tongue/throat swelling, SOB or lightheadedness with hypotension: No Has patient had a PCN reaction causing severe rash involving mucus membranes or skin necrosis: No Has patient had a PCN reaction that required hospitalization No Has patient had a PCN reaction occurring within the last 10 years: No If all of the above answers are "NO", then may proceed with Cephalosporin use.  Pt tolerant to cephalosporins   . Sulfa Antibiotics Itching and Rash    Antimicrobials this admission: 6/14 vancomycin >>  6/14 ceftazidime >>    Dose adjustments this admission: NA  Microbiology results: 6/14 UCx: NEG  6/14 BCx2: NGTD     Andrey Cota. Diona Foley, PharmD, [place] Clinical Pharmacist Pager [phone]  06/16/2016 12:45 PM

## 2016-06-16 NOTE — Progress Notes (Signed)
[place] Progress Note Patient Name: [name] DOB: [birth year] MRN: [id number]   Date of Service  06/16/2016  HPI/Events of Note  Hypertension with BP 174/75 (102) with HR of 85.    eICU Interventions  Plan: PRN labetalol 10 mg q2 hours as needed for systolic BP greater than [id number]     Intervention Category Intermediate Interventions: Hypertension - evaluation and management  DETERDING,ELIZABETH 06/16/2016, 5:51 AM

## 2016-06-16 NOTE — Progress Notes (Signed)
PULMONARY / CRITICAL CARE MEDICINE   Name: [name] MRN: [id number] DOB: 26-Nov-1951    ADMISSION DATE:  06/13/2016 CONSULTATION DATE:  06/13/2016  REFERRING MD:  Theotis Burrow, M.D. / EDP  CHIEF COMPLAINT:  Acute Encephalopathy  HISTORY OF PRESENT ILLNESS:   65 year old female with PMH as below, which includes DM, CAD, HTN, and recent admission for R femoral neck fracture s/p arthoplasty 6/8. She had suffered several fall prior to the fall that caused the fracture. Post operative course was fairly uncomplicated with the exception of some mild anemia requiring PRBC transfusion and UTI treated with levaquin. She was discharged to [facility] for rehabilitation on 6/12. 6/14 she returned to ER with chief complaint of AMS with associated hypotension. In the emergency department she was noted to be lethargic and confused. She was complaining of abdominal pain. She was hypotensive and hypothermic with elevated WBC. Glucose noted to be markedly elevated, however urine ketones negative. With metabolic acidosis. Lactic acid wnl. Due to persistent hypotension and concern for septic shock PCCM asked to admit.   SUBJECTIVE:  Respiratory failure overnight, on BiPAP.  VITAL SIGNS: BP 126/78 mmHg  Pulse 86  Temp(Src) 99 F (37.2 C) (Axillary)  Resp 25  Ht 5\' 3"  (1.6 m)  Wt 60.1 kg (132 lb 7.9 oz)  BMI 23.48 kg/m2  SpO2 95%  HEMODYNAMICS:    VENTILATOR SETTINGS: Vent Mode:  [-]  FiO2 (%):  [45 %-70 %] 70 %  INTAKE / OUTPUT: I/O last 3 completed shifts: In: [id number] [P.O.:180; I.V.:825; Other:70; IV Piggyback:700] Out: 3625 [id number]  PHYSICAL EXAMINATION: General:  Chronically ill appearing female. mild distress. On BiPAP. Neuro:  Somnolent but arousable, mildly confused, follows some commands, Grossly nonfocal. CN grossly in tact. HEENT:  Dry mucus membranes. No scleral injection or icterus.  Cardiovascular:  Regular rate. Trace edema. Normal S1 & S2. Lungs:  Coarse breath  sounds bilaterally. Normal work of breathing on supplemental oxygen. Abdomen:  Soft, non-tender, non-distended Musculoskeletal:  No acute deformity. Gr 1 edema  Skin:  Warm & dry. No rash on exposed skin.  LABS:  BMET  Recent Labs Lab 06/14/16 0939 06/15/16 0203 06/16/16 0005  NA 135 135 140  K 3.1* 3.6 3.2*  CL 109 112* 113*  CO2 16* 16* 19*  BUN 64* 61* 55*  CREATININE 2.92* 2.91* 2.60*  GLUCOSE 139* 254* 210*    Electrolytes  Recent Labs Lab 06/14/16 0939  06/15/16 0203 06/15/16 0624 06/15/16 1555 06/16/16 0005  CALCIUM 7.9*  --  7.9*  --   --  8.3*  MG 1.5*  < >  --  1.6* 1.6* 1.6*  PHOS 3.5  < >  --  3.6 3.4 3.5  < > = values in this interval not displayed.  CBC  Recent Labs Lab 06/13/16 1618 06/14/16 0320 06/15/16 0203  WBC 9.4 8.6 8.6  HGB 9.7* 10.0* 10.2*  HCT 31.2* 30.0* 30.4*  PLT 149* 155 176    Coag's No results for input(s): APTT, INR in the last 168 hours.  Sepsis Markers  Recent Labs Lab 06/13/16 1630 06/13/16 1933 06/13/16 2146 06/14/16 0320 06/15/16 0203  LATICACIDVEN 1.99 1.39  --   --   --   PROCALCITON  --   --  2.55 2.90 2.18    ABG  Recent Labs Lab 06/15/16 1001 06/15/16 2328  PHART 7.363 7.320*  PCO2ART 25.5* 31.7*  PO2ART 45.0* 48.0*    Liver Enzymes  Recent Labs Lab 06/13/16 1618  06/14/16 0320  AST 44* 40  ALT 17 14  ALKPHOS 78 65  BILITOT 2.3* 0.8  ALBUMIN 2.0* 1.8*    Cardiac Enzymes  Recent Labs Lab 06/14/16 2030 06/15/16 0203 06/15/16 0624  TROPONINI 3.80* 2.85* 2.64*    Glucose  Recent Labs Lab 06/15/16 0725 06/15/16 1134 06/15/16 1526 06/15/16 1916 06/16/16 0020 06/16/16 0402  GLUCAP 234* 215* 167* 167* 219* 135*    Imaging Dg Chest 1 View  06/15/2016  CLINICAL DATA:  Pneumonia EXAM: CHEST 1 VIEW COMPARISON:  06/13/2016 FINDINGS: Cardiac shadow is stable. Significant increase in bilateral infiltrates is seen. A component of this may be related to pulmonary edema as well.  No bony abnormality is seen. IMPRESSION: Significant increase in bilateral infiltrates. Electronically Signed   By: Inez Catalina M.D.   On: 06/15/2016 11:25    STUDIES:  TTE (06/24/15): EF 55-60%. LA & RA normal in size. RV normal in size and function. No aortic stenosis or regurgitation. Mild mitral regurgitation. Mild tricuspid regurgitation. Port CXR 6/14: Bilateral hilar fullness and lower lobe opacification with silhouetting of the left hemidiaphragm. Port Abd X-ray 6/14:  No free air under diaphragm. Right hip hemiarthroplasty. Bowel gas pattern nonobstructive.  MICROBIOLOGY: MRSA PCR 6/14 >> (-) Blood Ctx x2 6/14 >> (-) Urine Ctx 6/14 >> (-)  ANTIBIOTICS: Tressie Ellis 6/14 >> Vancomycin 6/14 >>  SIGNIFICANT EVENTS: 6/14 - Admit  LINES/TUBES: PIV x3  DISCUSSION:  65 year old female presenting with shock which is gradually resolving and diabetic ketoacidosis. Suspect underlying sepsis. Clinical status improving with correction of acidosis and hypotension. Elevated cardiac biomarkers suspicious for demand ischemia. Continuing broad-spectrum antibiotics while awaiting culture results.   Also, pt with AKI on top of CKD with metabolic acidosis.   ASSESSMENT / PLAN:  PULMONARY A: ?HCAP  L base Concern now for pulm edema  P:   BiPAP for now but anticipate if unable to diurese will need intubation, will discuss code status with the patient's son as she is unclear with her wishes. Pulmonary hygiene  Continuous Pulse Ox Supplemental O2  Titrate O2 for sat of 88-92%.  CARDIOVASCULAR A:  Shock - Likely combination of hypovolemia & sepsis - improving.  Concern for pulm edema Elevated Troponin I - Likely due to shock & acidosis. H/O Hyperlipidemia H/O HTN H/O CAD P:  Monitoring on telemetry Vitals per unit protocol Echo noted EF 65% and PAP of 48. Hold off on IVF > will resume if CXR not worse.   RENAL A:   Acute on Chronic Renal Failure - Baseline creatinine 1.9-2.0. CKD  st IV Metabolic Acidosis - From DKA and AKI  P:   Trending UOP Monitoring electrolytes & renal function daily Replacing electrolytes as indicated Lasix drip. Need to discuss option for dialysis  GASTROINTESTINAL A:   Abdominal Pain - Possible pancreatitis with elevated Lipase. Bilirubin elevated. H/O GERD  P:   NPO while on BiPAP.  HEMATOLOGIC/ONCOLOGIC A:   Anemia - Near baseline anemia. No signs of active bleeding. Thrombocytopenia - Slightly above baseline due to hemoconcentration. H/O MGUS  P:  Trending cell counts daily w/ CBC SCDs Heparin [place] q8hr  INFECTIOUS A:   Sepsis - Possible HCAP. Urine and blood (-) so far.   P:   Empiric Vancomycin & Fortaz > if CXR is better, will deescalate abx.  Trending Procalcitonin per algorithm. Still elevated on 6/16 F/u culture data  Urine Strep (-);  Legionella Antigen pending   ENDOCRINE A:   DKA v HHS-- NO ketones, did  initially have anion gap acidosis, now resolved.   DM   P:   CBG still in 200 mg% range but pt is on D5 IVF and has CKD. Will d/c D5 IVF and observe CBG. Will keep on sliding scale for now.   NEUROLOGIC A:   Acute Encephalopathy - Likely metabolic. Improving. H/O Depression H/O TIA  P:   Monitor closely in ICU. (-) lateralizing signs.  PRN Ativan for anxiety.  FAMILY  - Updates: Spoke with patient, can not determine code status, confused, spoke to sister and she does not have the phone number of the Chrissie Mcburney who the patient wants as main Media planner, called ex-husband no response then called [facility] (sone) whom the patient does not want involved to get phone number but no response.  Remains full code for now.  - Inter-disciplinary family meet or Palliative Care meeting due by:  6/21  The patient is critically ill with multiple organ systems failure and requires high complexity decision making for assessment and support, frequent evaluation and titration of therapies, application of  advanced monitoring technologies and extensive interpretation of multiple databases.   Critical Care Time devoted to patient care services described in this note is  35  Minutes. This time reflects time of care of this signee Dr Jennet Maduro. This critical care time does not reflect procedure time, or teaching time or supervisory time of PA/NP/Med student/Med Resident etc but could involve care discussion time.  Rush Farmer, M.D. [facility] Pulmonary/Critical Care Medicine. Pager: [phone]. After hours pager: [phone].

## 2016-06-16 NOTE — Progress Notes (Signed)
PT Cancellation Note  Patient Details Name: RIKKIA KUCERA MRN: [id number] DOB: [birth year]   Cancelled Treatment:    Reason Eval/Treat Not Completed: Patient not medically ready. Possible intubation later today per RN. Will continue to follow and evaluate when pt is medically ready to participate.    Rolinda Roan 06/16/2016, 12:16 PM   Rolinda Roan, PT, DPT Acute Rehabilitation Services Pager: [phone]

## 2016-06-16 NOTE — Progress Notes (Signed)
[place] Progress Note Patient Name: [name] DOB: 09/21/1951 MRN: [id number]   Date of Service  06/16/2016  HPI/Events of Note  Hypokalemia and hypomag  eICU Interventions  Potassium and mag replaced     Intervention Category Intermediate Interventions: Electrolyte abnormality - evaluation and management  DETERDING,ELIZABETH 06/16/2016, 12:59 AM

## 2016-06-17 ENCOUNTER — Inpatient Hospital Stay (HOSPITAL_COMMUNITY): Payer: Commercial Managed Care - HMO

## 2016-06-17 DIAGNOSIS — J810 Acute pulmonary edema: Secondary | ICD-10-CM

## 2016-06-17 LAB — PHOSPHORUS
Phosphorus: 3.5 mg/dL (ref 2.5–4.6)
Phosphorus: 3.9 mg/dL (ref 2.5–4.6)
Phosphorus: 4.3 mg/dL (ref 2.5–4.6)

## 2016-06-17 LAB — GLUCOSE, CAPILLARY
GLUCOSE-CAPILLARY: 193 mg/dL — AB (ref 65–99)
Glucose-Capillary: 213 mg/dL — ABNORMAL HIGH (ref 65–99)
Glucose-Capillary: 184 mg/dL — ABNORMAL HIGH (ref 65–99)
Glucose-Capillary: 187 mg/dL — ABNORMAL HIGH (ref 65–99)
Glucose-Capillary: 174 mg/dL — ABNORMAL HIGH (ref 65–99)
Glucose-Capillary: 173 mg/dL — ABNORMAL HIGH (ref 65–99)

## 2016-06-17 LAB — CBC
HCT: 32.4 % — ABNORMAL LOW (ref 36.0–46.0)
Hemoglobin: 11.1 g/dL — ABNORMAL LOW (ref 12.0–15.0)
MCH: 29.7 pg (ref 26.0–34.0)
MCHC: 34.3 g/dL (ref 30.0–36.0)
MCV: 86.6 fL (ref 78.0–100.0)
PLATELETS: 196 10*3/uL (ref 150–400)
RBC: 3.74 MIL/uL — ABNORMAL LOW (ref 3.87–5.11)
RDW: 17.3 % — AB (ref 11.5–15.5)
WBC: 17.3 10*3/uL — AB (ref 4.0–10.5)

## 2016-06-17 LAB — BASIC METABOLIC PANEL
Anion gap: 13 (ref 5–15)
BUN: 56 mg/dL — AB (ref 6–20)
CHLORIDE: 107 mmol/L (ref 101–111)
CO2: 18 mmol/L — ABNORMAL LOW (ref 22–32)
CREATININE: 2.54 mg/dL — AB (ref 0.44–1.00)
Calcium: 8.8 mg/dL — ABNORMAL LOW (ref 8.9–10.3)
GFR calc Af Amer: 22 mL/min — ABNORMAL LOW (ref >60)
GFR calc non Af Amer: 19 mL/min — ABNORMAL LOW (ref >60)
GLUCOSE: 204 mg/dL — AB (ref 65–99)
Potassium: 2.9 mmol/L — ABNORMAL LOW (ref 3.5–5.1)
SODIUM: 138 mmol/L (ref 135–145)

## 2016-06-17 LAB — MAGNESIUM
MAGNESIUM: 2.4 mg/dL (ref 1.7–2.4)
MAGNESIUM: 2.1 mg/dL (ref 1.7–2.4)
Magnesium: 2.5 mg/dL — ABNORMAL HIGH (ref 1.7–2.4)

## 2016-06-17 LAB — POTASSIUM: POTASSIUM: 3.5 mmol/L (ref 3.5–5.1)

## 2016-06-17 MED ORDER — POTASSIUM CHLORIDE 10 MEQ/100ML IV SOLN
10.0000 meq | INTRAVENOUS | Status: AC
  Administered 2016-06-17 (×4): 10 meq via INTRAVENOUS
  Filled 2016-06-17 (×4): qty 100

## 2016-06-17 MED ORDER — METOLAZONE 5 MG PO TABS
10.0000 mg | ORAL_TABLET | Freq: Once | ORAL | Status: AC
  Administered 2016-06-17: 10 mg via ORAL
  Filled 2016-06-17: qty 2

## 2016-06-17 MED ORDER — FUROSEMIDE 10 MG/ML IJ SOLN
10.0000 mg/h | INTRAVENOUS | Status: DC
  Administered 2016-06-17: 10 mg/h via INTRAVENOUS
  Filled 2016-06-17: qty 25

## 2016-06-17 MED ORDER — FUROSEMIDE 10 MG/ML IJ SOLN
8.0000 mg/h | INTRAVENOUS | Status: DC
  Filled 2016-06-17: qty 25

## 2016-06-17 MED ORDER — FENTANYL CITRATE (PF) 100 MCG/2ML IJ SOLN
25.0000 ug | INTRAMUSCULAR | Status: DC | PRN
  Administered 2016-06-17 – 2016-06-22 (×4): 50 ug via INTRAVENOUS
  Filled 2016-06-17 (×4): qty 2

## 2016-06-17 MED ORDER — POTASSIUM CHLORIDE 10 MEQ/100ML IV SOLN
10.0000 meq | INTRAVENOUS | Status: AC
  Administered 2016-06-17 (×6): 10 meq via INTRAVENOUS
  Filled 2016-06-17 (×6): qty 100

## 2016-06-17 MED ORDER — ACETAMINOPHEN 650 MG RE SUPP
650.0000 mg | RECTAL | Status: DC | PRN
  Administered 2016-06-17: 650 mg via RECTAL
  Filled 2016-06-17: qty 1

## 2016-06-17 MED ORDER — DEXTROSE 5 % IV SOLN
1.0000 g | INTRAVENOUS | Status: DC
  Administered 2016-06-18 – 2016-06-20 (×3): 1 g via INTRAVENOUS
  Filled 2016-06-17 (×5): qty 1

## 2016-06-17 MED ORDER — VANCOMYCIN HCL IN DEXTROSE 750-5 MG/150ML-% IV SOLN
750.0000 mg | INTRAVENOUS | Status: DC
  Administered 2016-06-18 – 2016-06-20 (×2): 750 mg via INTRAVENOUS
  Filled 2016-06-17 (×3): qty 150

## 2016-06-17 NOTE — Progress Notes (Signed)
PT Cancellation Note  Patient Details Name: [name] MRN: [id number] DOB: [birth year]   Cancelled Treatment:    Reason Eval/Treat Not Completed: Patient not medically ready. Pt on BiPap with FiO2 80%. Discussed with RN and agreed to defer therapy at this time   Shequita Peplinski 06/17/2016, 8:11 AM  Pager [phone]

## 2016-06-17 NOTE — Progress Notes (Signed)
Pharmacy Antibiotic Note  [name] is a 65 y.o. female admitted on 06/13/2016 with sepsis.  Pharmacy has been consulted for Vancomycin + Ceftazidime dosing.  A Vancomycin trough this evening resulted as SUPRAtherapeutic (VT 26 mcg/ml, goal of 15-20 mcg/ml). Will hold for 20 hours and restart at lower dose of 750 mg/48h.   The RN Anderson Malta) charted at 1708 that the dose was given at 1600 - however upon conversation with the RN - the dose has not been hung yet for today. Instructed the RN to hold the dose. Next dose due on 6/19.  Plan: 1. Start Vancomycin 750 mg IV every 48 hours (next dose on 6/19 @ 1200) 2. Adjust Ceftazidime to 1g IV every 24 hours 3. Will continue to follow renal function, culture results, LOT, and antibiotic de-escalation plans   Height: 5\' 3"  (160 cm) Weight: 116 lb 13.5 oz (53 kg) IBW/kg (Calculated) : 52.4  Temp (24hrs), Avg:99.4 F (37.4 C), Min:97.7 F (36.5 C), Max:100.2 F (37.9 C)   Recent Labs Lab 06/13/16 1618 06/13/16 1630 06/13/16 1933  06/14/16 0320 06/14/16 0939 06/15/16 0203 06/16/16 0005 06/17/16 0010 06/17/16 1601  WBC 9.4  --   --   --  8.6  --  8.6  --  17.3*  --   CREATININE 3.82*  --   --   < > 3.06* 2.92* 2.91* 2.60* 2.54*  --   LATICACIDVEN  --  1.99 1.39  --   --   --   --   --   --   --   VANCOTROUGH  --   --   --   --   --   --   --   --   --  26*  < > = values in this interval not displayed.  Estimated Creatinine Clearance: 18 mL/min (by C-G formula based on Cr of 2.54).    Allergies  Allergen Reactions  . Codeine Other (See Comments)    Noted as an allergy on MAR  . Hydralazine Hcl Swelling  . Bisphosphonates Rash  . Codeine Phosphate Nausea And Vomiting and Rash  . Penicillins Itching, Rash and Other (See Comments)    Has patient had a PCN reaction causing immediate rash, facial/tongue/throat swelling, SOB or lightheadedness with hypotension: No Has patient had a PCN reaction causing severe rash involving  mucus membranes or skin necrosis: No Has patient had a PCN reaction that required hospitalization No Has patient had a PCN reaction occurring within the last 10 years: No If all of the above answers are "NO", then may proceed with Cephalosporin use.  Pt tolerant to cephalosporins   . Sulfa Antibiotics Itching and Rash    Antimicrobials this admission: Vanc 6/15 >> Ceftazidime 6/14 >>  Dose adjustments this admission: * 6/18 VT 26 mcg/ml >> adjusted to 750 mg/48h  Microbiology results: 6/14 UCx >> NG 6/14 BCx >> ngtd  Thank you for allowing pharmacy to be a part of this patient's care.  Alycia Rossetti, PharmD, BCPS Clinical Pharmacist Pager: [phone] 06/17/2016 5:27 PM

## 2016-06-17 NOTE — Progress Notes (Signed)
RT attempted to titrate FIO2 to 50%, but the patient's sat dropped to 90%. Patient placed back on 60%, sat 94%.

## 2016-06-17 NOTE — Progress Notes (Signed)
PULMONARY / CRITICAL CARE MEDICINE   Name: [name] MRN: [id number] DOB: March 27, 1951    ADMISSION DATE:  06/13/2016 CONSULTATION DATE:  06/13/2016  REFERRING MD:  Theotis Burrow, M.D. / EDP  CHIEF COMPLAINT:  Acute Encephalopathy  HISTORY OF PRESENT ILLNESS:   65 year old female with PMH as below, which includes DM, CAD, HTN, and recent admission for R femoral neck fracture s/p arthoplasty 6/8. She had suffered several fall prior to the fall that caused the fracture. Post operative course was fairly uncomplicated with the exception of some mild anemia requiring PRBC transfusion and UTI treated with levaquin. She was discharged to [facility] for rehabilitation on 6/12. 6/14 she returned to ER with chief complaint of AMS with associated hypotension. In the emergency department she was noted to be lethargic and confused. She was complaining of abdominal pain. She was hypotensive and hypothermic with elevated WBC. Glucose noted to be markedly elevated, however urine ketones negative. With metabolic acidosis. Lactic acid wnl. Due to persistent hypotension and concern for septic shock PCCM asked to admit.   SUBJECTIVE:  Desaturates quickly off BiPAP and c/o hip pain.  VITAL SIGNS: BP 167/76 mmHg  Pulse 89  Temp(Src) 97.7 F (36.5 C) (Axillary)  Resp 27  Ht 5\' 3"  (1.6 m)  Wt 53 kg (116 lb 13.5 oz)  BMI 20.70 kg/m2  SpO2 97%  HEMODYNAMICS:    VENTILATOR SETTINGS: Vent Mode:  [-]  FiO2 (%):  [70 %-80 %] 80 %  INTAKE / OUTPUT: I/O last 3 completed shifts: In: 1413.7 [I.V.:153.7; Other:60; IV Piggyback:1200] Out: 6400 [Urine:6400]  PHYSICAL EXAMINATION: General:  Chronically ill appearing female.  Mild distress. On BiPAP. Neuro:  Somnolent but arousable, mildly confused, follows some commands, Grossly nonfocal. CN grossly in tact. HEENT:  Dry mucus membranes. No scleral injection or icterus.  Cardiovascular:  Regular rate. Trace edema. Normal S1 & S2. Lungs:  Coarse  breath sounds bilaterally. Normal work of breathing on supplemental oxygen. Abdomen:  Soft, non-tender, non-distended Musculoskeletal:  No acute deformity. Gr 1 edema  Skin:  Warm & dry. No rash on exposed skin.  LABS:  BMET  Recent Labs Lab 06/15/16 0203 06/16/16 0005 06/17/16 0010 06/17/16 0733  NA 135 140 138  --   K 3.6 3.2* 2.9* 3.5  CL 112* 113* 107  --   CO2 16* 19* 18*  --   BUN 61* 55* 56*  --   CREATININE 2.91* 2.60* 2.54*  --   GLUCOSE 254* 210* 204*  --     Electrolytes  Recent Labs Lab 06/15/16 0203  06/16/16 0005  06/16/16 1543 06/17/16 0010 06/17/16 0718  CALCIUM 7.9*  --  8.3*  --   --  8.8*  --   MG  --   < > 1.6*  < > 2.4 2.5* 2.4  PHOS  --   < > 3.5  < > 4.1 4.3 3.9  < > = values in this interval not displayed.  CBC  Recent Labs Lab 06/14/16 0320 06/15/16 0203 06/17/16 0010  WBC 8.6 8.6 17.3*  HGB 10.0* 10.2* 11.1*  HCT 30.0* 30.4* 32.4*  PLT 155 176 196    Coag's No results for input(s): APTT, INR in the last 168 hours.  Sepsis Markers  Recent Labs Lab 06/13/16 1630 06/13/16 1933 06/13/16 2146 06/14/16 0320 06/15/16 0203  LATICACIDVEN 1.99 1.39  --   --   --   PROCALCITON  --   --  2.55 2.90 2.18  ABG  Recent Labs Lab 06/15/16 1001 06/15/16 2328  PHART 7.363 7.320*  PCO2ART 25.5* 31.7*  PO2ART 45.0* 48.0*    Liver Enzymes  Recent Labs Lab 06/13/16 1618 06/14/16 0320  AST 44* 40  ALT 17 14  ALKPHOS 78 65  BILITOT 2.3* 0.8  ALBUMIN 2.0* 1.8*    Cardiac Enzymes  Recent Labs Lab 06/14/16 2030 06/15/16 0203 06/15/16 0624  TROPONINI 3.80* 2.85* 2.64*    Glucose  Recent Labs Lab 06/16/16 1513 06/16/16 1651 06/16/16 1908 06/16/16 2311 06/17/16 0424 06/17/16 0713  GLUCAP 177* 201* 163* 213* 184* 187*    Imaging Dg Chest Port 1 View  06/17/2016  CLINICAL DATA:  Patient with acute respiratory failure. Severe shortness of breath. EXAM: PORTABLE CHEST 1 VIEW COMPARISON:  Chest radiograph  06/15/2016. FINDINGS: Monitoring leads overlie the patient. Cardiomediastinal contours largely obscured. Grossly unchanged diffuse bilateral airspace opacities. Probable small bilateral pleural Fahrenheit scratch of small bilateral pleural effusions. No pneumothorax. IMPRESSION: Grossly unchanged diffuse bilateral airspace opacities. Small bilateral pleural effusions. Electronically Signed   By: Lovey Newcomer M.D.   On: 06/17/2016 08:03    STUDIES:  TTE (06/24/15): EF 55-60%. LA & RA normal in size. RV normal in size and function. No aortic stenosis or regurgitation. Mild mitral regurgitation. Mild tricuspid regurgitation. Port CXR 6/14: Bilateral hilar fullness and lower lobe opacification with silhouetting of the left hemidiaphragm. Port Abd X-ray 6/14:  No free air under diaphragm. Right hip hemiarthroplasty. Bowel gas pattern nonobstructive.  MICROBIOLOGY: MRSA PCR 6/14 >> (-) Blood Ctx x2 6/14 >> (-) Urine Ctx 6/14 >> (-)  ANTIBIOTICS: Tressie Ellis 6/14 >> Vancomycin 6/14 >>  SIGNIFICANT EVENTS: 6/14 - Admit  LINES/TUBES: PIV x3  DISCUSSION:  65 year old female presenting with shock which is gradually resolving and diabetic ketoacidosis. Suspect underlying sepsis. Clinical status improving with correction of acidosis and hypotension. Elevated cardiac biomarkers suspicious for demand ischemia. Continuing broad-spectrum antibiotics while awaiting culture results.   Also, pt with AKI on top of CKD with metabolic acidosis.   ASSESSMENT / PLAN:  PULMONARY A: ?HCAP  L base Concern now for pulm edema  P:   Continue BiPAP while we attempt diureses further with a lasix drip. Pulmonary hygiene  Continuous Pulse Ox Supplemental O2  Titrate O2 for sat of 88-92%. Full code, if deteriorates will intubate.  CARDIOVASCULAR A:  Shock - Likely combination of hypovolemia & sepsis - improving.  Concern for pulm edema Elevated Troponin I - Likely due to shock & acidosis. H/O Hyperlipidemia H/O  HTN H/O CAD P:  Monitoring on telemetry. Vitals per unit protocol. Echo noted EF 65% and PAP of 48.  RENAL A:   Acute on Chronic Renal Failure - Baseline creatinine 1.9-2.0. CKD st IV Metabolic Acidosis - From DKA and AKI  P:   Trending UOP. Monitoring electrolytes & renal function daily. Replacing electrolytes as indicated. Lasix drip, increase to 10 mg/hr. Need to discuss option for dialysis with son if UOP drops.  GASTROINTESTINAL A:   Abdominal Pain - Possible pancreatitis with elevated Lipase. Bilirubin elevated. H/O GERD  P:   NPO while on BiPAP.  HEMATOLOGIC/ONCOLOGIC A:   Anemia - Near baseline anemia. No signs of active bleeding. Thrombocytopenia - Slightly above baseline due to hemoconcentration. H/O MGUS  P:  Trending cell counts daily w/ CBC SCDs Heparin [place] q8hr  INFECTIOUS A:   Sepsis - Possible HCAP. Urine and blood (-) so far.   P:   Empiric Vancomycin & Fortaz > if CXR is  better, will deescalate abx.  Trending Procalcitonin per algorithm. Still elevated on 6/16 F/u culture data  Urine Strep (-);  Legionella Antigen pending   ENDOCRINE A:   DKA v HHS-- NO ketones, did initially have anion gap acidosis, now resolved.   DM   P:   CBG still in 200 mg% range but pt is on D5 IVF and has CKD. Will d/c D5 IVF and observe CBG. Will keep on sliding scale for now.   NEUROLOGIC A:   Acute Encephalopathy - Likely metabolic. Improving. H/O Depression H/O TIA  P:   Monitor closely in ICU. (-) lateralizing signs.  PRN Ativan for anxiety.  FAMILY  - Updates: Per patient's son, full code status.  Sister updated bedside 6/18 and will inform son.  - Inter-disciplinary family meet or Palliative Care meeting due by:  6/21  The patient is critically ill with multiple organ systems failure and requires high complexity decision making for assessment and support, frequent evaluation and titration of therapies, application of advanced monitoring  technologies and extensive interpretation of multiple databases.   Critical Care Time devoted to patient care services described in this note is  35  Minutes. This time reflects time of care of this signee Dr Jennet Maduro. This critical care time does not reflect procedure time, or teaching time or supervisory time of PA/NP/Med student/Med Resident etc but could involve care discussion time.  Rush Farmer, M.D. [facility] Pulmonary/Critical Care Medicine. Pager: [phone]. After hours pager: [phone].

## 2016-06-17 NOTE — Progress Notes (Signed)
RT called to patient's room due to desat to 85%. RT adjusted the Bipap mask and increased FIO2 to 50%. Patient's sat recovered to 94%.

## 2016-06-17 NOTE — Clinical Social Work Note (Signed)
Clinical Social Work Assessment  Patient Details  Name: [name] MRN: [id number] Date of Birth: October 28, 1951  Date of referral:  06/17/16               Reason for consult:  Discharge Planning                Permission sought to share information with:  Case Manager, Facility Sport and exercise psychologist, Family Supports Permission granted to share information::  Yes, Verbal Permission Granted  Name::        Agency::   (SNF)  Relationship::     Contact Information:     Housing/Transportation Living arrangements for the past 2 months:  [place] of Information:  Medical Team, Other (Comment Required) (Pt sister) Patient Interpreter Needed:  None Criminal Activity/Legal Involvement Pertinent to Current Situation/Hospitalization:  No - Comment as needed Significant Relationships:  Adult Children, Siblings Lives with:  Facility Resident Do you feel safe going back to the place where you live?  No Need for family participation in patient care:  Yes (Comment)  Care giving concerns: Pt unable to participate in assessment. Pt sister stated that family does not want pt to return to [facility]. Pt sister also stated that pt son is POA and will need to make decision regarding new SNF for pt to discharge to once medically stable.    Social Worker assessment / plan: Holiday representative met with pt to discuss CSW role with discharge planning. Pt sister stated pt is from SNF and that she does not want pt to return back to [facility] when discharged. Pt sister said she will speak with pt son who is pt's POA about choosing new SNF to discharge to. Pt family appears agreeable to SNF for higher level of care.   Employment status:  Retired Forensic scientist:  Medicare PT Recommendations:  [place] / Referral to community resources:  [place]  Patient/Family's Response to care: Pt disoriented. Pt sister appears happy with  care her sister is receiving at [facility].   Patient/Family's Understanding of and Emotional Response to Diagnosis, Current Treatment, and Prognosis: Pt sister appears to have a good understanding for reason pt admitted to the hospital and with pt care plan.   Emotional Assessment Appearance:  Appears stated age Attitude/Demeanor/Rapport:   (Unable to Access) Affect (typically observed):  Unable to Assess Orientation:  Oriented to Self, Oriented to Place Alcohol / Substance use:  Not Applicable Psych involvement (Current and /or in the community):  No (Comment)  Discharge Needs  Concerns to be addressed:  Discharge Planning Concerns Readmission within the last 30 days:  No Current discharge risk:  None Barriers to Discharge:  Continued Medical Work up   Junie Spencer, LCSW 06/17/2016, 5:15 PM

## 2016-06-17 NOTE — Progress Notes (Signed)
[place] Progress Note Patient Name: [name] DOB: 06-15-1951 MRN: [id number]   Date of Service  06/17/2016  HPI/Events of Note  Hypokalemia on lasix gtt  eICU Interventions  Potassium replaced     Intervention Category Intermediate Interventions: Electrolyte abnormality - evaluation and management  DETERDING,ELIZABETH 06/17/2016, 1:17 AM

## 2016-06-18 ENCOUNTER — Inpatient Hospital Stay (HOSPITAL_COMMUNITY): Payer: Commercial Managed Care - HMO

## 2016-06-18 DIAGNOSIS — I25119 Atherosclerotic heart disease of native coronary artery with unspecified angina pectoris: Secondary | ICD-10-CM

## 2016-06-18 DIAGNOSIS — J9601 Acute respiratory failure with hypoxia: Secondary | ICD-10-CM | POA: Diagnosis present

## 2016-06-18 LAB — GLUCOSE, CAPILLARY
GLUCOSE-CAPILLARY: 142 mg/dL — AB (ref 65–99)
GLUCOSE-CAPILLARY: 190 mg/dL — AB (ref 65–99)
GLUCOSE-CAPILLARY: 224 mg/dL — AB (ref 65–99)
Glucose-Capillary: 163 mg/dL — ABNORMAL HIGH (ref 65–99)
Glucose-Capillary: 223 mg/dL — ABNORMAL HIGH (ref 65–99)
Glucose-Capillary: 279 mg/dL — ABNORMAL HIGH (ref 65–99)

## 2016-06-18 LAB — MAGNESIUM
MAGNESIUM: 2.1 mg/dL (ref 1.7–2.4)
Magnesium: 2.1 mg/dL (ref 1.7–2.4)
Magnesium: 2 mg/dL (ref 1.7–2.4)
Magnesium: 1.9 mg/dL (ref 1.7–2.4)

## 2016-06-18 LAB — CBC
HEMATOCRIT: 29.8 % — AB (ref 36.0–46.0)
Hemoglobin: 10.1 g/dL — ABNORMAL LOW (ref 12.0–15.0)
MCH: 30 pg (ref 26.0–34.0)
MCHC: 33.9 g/dL (ref 30.0–36.0)
MCV: 88.4 fL (ref 78.0–100.0)
Platelets: 225 10*3/uL (ref 150–400)
RBC: 3.37 MIL/uL — ABNORMAL LOW (ref 3.87–5.11)
RDW: 17.4 % — AB (ref 11.5–15.5)
WBC: 13.9 10*3/uL — ABNORMAL HIGH (ref 4.0–10.5)

## 2016-06-18 LAB — BASIC METABOLIC PANEL
Anion gap: 12 (ref 5–15)
BUN: 55 mg/dL — AB (ref 6–20)
CHLORIDE: 100 mmol/L — AB (ref 101–111)
CO2: 25 mmol/L (ref 22–32)
Calcium: 9.2 mg/dL (ref 8.9–10.3)
Creatinine, Ser: 2.36 mg/dL — ABNORMAL HIGH (ref 0.44–1.00)
GFR calc Af Amer: 24 mL/min — ABNORMAL LOW (ref >60)
GFR calc non Af Amer: 20 mL/min — ABNORMAL LOW (ref >60)
GLUCOSE: 168 mg/dL — AB (ref 65–99)
POTASSIUM: 2.8 mmol/L — AB (ref 3.5–5.1)
Sodium: 137 mmol/L (ref 135–145)

## 2016-06-18 LAB — POTASSIUM
POTASSIUM: 3.1 mmol/L — AB (ref 3.5–5.1)
Potassium: 2.5 mmol/L — CL (ref 3.5–5.1)

## 2016-06-18 LAB — VANCOMYCIN, TROUGH: Vancomycin Tr: 26 ug/mL (ref 10.0–20.0)

## 2016-06-18 LAB — CULTURE, BLOOD (ROUTINE X 2)
Culture: NO GROWTH
Culture: NO GROWTH

## 2016-06-18 LAB — PHOSPHORUS
PHOSPHORUS: 2.9 mg/dL (ref 2.5–4.6)
Phosphorus: 3.4 mg/dL (ref 2.5–4.6)
Phosphorus: 3.6 mg/dL (ref 2.5–4.6)
Phosphorus: 3.5 mg/dL (ref 2.5–4.6)

## 2016-06-18 MED ORDER — POTASSIUM CHLORIDE 10 MEQ/100ML IV SOLN
10.0000 meq | INTRAVENOUS | Status: AC
  Administered 2016-06-18 (×4): 10 meq via INTRAVENOUS
  Filled 2016-06-18 (×4): qty 100

## 2016-06-18 MED ORDER — VITAL HIGH PROTEIN PO LIQD
1000.0000 mL | ORAL | Status: DC
  Administered 2016-06-18 (×4)
  Administered 2016-06-18: 1000 mL
  Administered 2016-06-18 – 2016-06-19 (×5)
  Administered 2016-06-19: 40 mL/h
  Administered 2016-06-19 (×2)
  Filled 2016-06-18 (×2): qty 1000

## 2016-06-18 MED ORDER — FUROSEMIDE 10 MG/ML IJ SOLN
10.0000 mg/h | INTRAVENOUS | Status: DC
  Administered 2016-06-18: 10 mg/h via INTRAVENOUS
  Filled 2016-06-18: qty 25

## 2016-06-18 MED ORDER — POTASSIUM CHLORIDE 10 MEQ/100ML IV SOLN
10.0000 meq | INTRAVENOUS | Status: DC
  Administered 2016-06-18 – 2016-06-19 (×6): 10 meq via INTRAVENOUS
  Filled 2016-06-18 (×6): qty 100

## 2016-06-18 MED ORDER — PRO-STAT SUGAR FREE PO LIQD
30.0000 mL | Freq: Two times a day (BID) | ORAL | Status: DC
  Administered 2016-06-18 – 2016-06-19 (×2): 30 mL
  Filled 2016-06-18 (×2): qty 30

## 2016-06-18 NOTE — Care Management Important Message (Signed)
Important Message  Patient Details  Name: [name] MRN: [id number] Date of Birth: 14-Jul-1951   Medicare Important Message Given:  Yes    Araina Butrick Abena 06/18/2016, 11:13 AM

## 2016-06-18 NOTE — Progress Notes (Signed)
PULMONARY / CRITICAL CARE MEDICINE   Name: [name] MRN: [id number] DOB: [birth year]    ADMISSION DATE:  06/13/2016 CONSULTATION DATE:  06/13/2016  REFERRING MD:  Theotis Burrow, M.D. / EDP  CHIEF COMPLAINT:  Acute Encephalopathy  HISTORY OF PRESENT ILLNESS:   65 year old female with PMH as below, which includes DM, CAD, HTN, and recent admission for R femoral neck fracture s/p arthoplasty 6/8. She had suffered several fall prior to the fall that caused the fracture. Post operative course was fairly uncomplicated with the exception of some mild anemia requiring PRBC transfusion and UTI treated with levaquin. She was discharged to [facility] for rehabilitation on 6/12. 6/14 she returned to ER with chief complaint of AMS with associated hypotension. In the emergency department she was noted to be lethargic and confused. She was complaining of abdominal pain. She was hypotensive and hypothermic with elevated WBC. Glucose noted to be markedly elevated, however urine ketones negative. With metabolic acidosis. Lactic acid wnl. Due to persistent hypotension and concern for septic shock PCCM asked to admit.   SUBJECTIVE:  Desaturates quickly off BiPAP and c/o hip pain. Tolerating bipap. Making good urine on lasix drip.  Waxing and waning sensorium > same since Friday.   VITAL SIGNS: BP 147/67 mmHg  Pulse 88  Temp(Src) 98.3 F (36.8 C) (Axillary)  Resp 20  Ht 5\' 3"  (1.6 m)  Wt 47.4 kg (104 lb 8 oz)  BMI 18.52 kg/m2  SpO2 97%  HEMODYNAMICS:    VENTILATOR SETTINGS: Vent Mode:  [-]  FiO2 (%):  [35 %-50 %] 35 %  INTAKE / OUTPUT: I/O last 3 completed shifts: In: 1784.8 [I.V.:334.8; IV [id number] Out: 8026 [Urine:8025; Stool:1]  PHYSICAL EXAMINATION: General:  Chronically ill appearing female.  Mild distress. On BiPAP. Neuro:  Somnolent but arousable, mildly confused, follows some commands, Grossly nonfocal. CN grossly in tact. HEENT:  Dry mucus membranes. No scleral  injection or icterus.  Cardiovascular:  Regular rate. Trace edema. Normal S1 & S2. Lungs:  Coarse breath sounds bilaterally. Normal work of breathing on supplemental oxygen. Abdomen:  Soft, non-tender, non-distended Musculoskeletal:  No acute deformity. Gr 1 edema  Skin:  Warm & dry. No rash on exposed skin.  LABS:  BMET  Recent Labs Lab 06/16/16 0005 06/17/16 0010 06/17/16 0733 06/18/16 0020  NA 140 138  --  137  K 3.2* 2.9* 3.5 2.8*  CL 113* 107  --  100*  CO2 19* 18*  --  25  BUN 55* 56*  --  55*  CREATININE 2.60* 2.54*  --  2.36*  GLUCOSE 210* 204*  --  168*    Electrolytes  Recent Labs Lab 06/16/16 0005  06/17/16 0010 06/17/16 0718 06/17/16 1601 06/18/16 0020  CALCIUM 8.3*  --  8.8*  --   --  9.2  MG 1.6*  < > 2.5* 2.4 2.1 2.1  PHOS 3.5  < > 4.3 3.9 3.5 3.4  < > = values in this interval not displayed.  CBC  Recent Labs Lab 06/15/16 0203 06/17/16 0010 06/18/16 0020  WBC 8.6 17.3* 13.9*  HGB 10.2* 11.1* 10.1*  HCT 30.4* 32.4* 29.8*  PLT 176 196 225    Coag's No results for input(s): APTT, INR in the last 168 hours.  Sepsis Markers  Recent Labs Lab 06/13/16 1630 06/13/16 1933 06/13/16 2146 06/14/16 0320 06/15/16 0203  LATICACIDVEN 1.99 1.39  --   --   --   PROCALCITON  --   --  2.55 2.90 2.18    ABG  Recent Labs Lab 06/15/16 1001 06/15/16 2328  PHART 7.363 7.320*  PCO2ART 25.5* 31.7*  PO2ART 45.0* 48.0*    Liver Enzymes  Recent Labs Lab 06/13/16 1618 06/14/16 0320  AST 44* 40  ALT 17 14  ALKPHOS 78 65  BILITOT 2.3* 0.8  ALBUMIN 2.0* 1.8*    Cardiac Enzymes  Recent Labs Lab 06/14/16 2030 06/15/16 0203 06/15/16 0624  TROPONINI 3.80* 2.85* 2.64*    Glucose  Recent Labs Lab 06/17/16 1148 06/17/16 1524 06/17/16 1907 06/17/16 2312 06/18/16 0319 06/18/16 0745  GLUCAP 174* 173* 193* 142* 190* 163*    Imaging No results found.  STUDIES:  TTE (06/24/15): EF 55-60%. LA & RA normal in size. RV normal in size  and function. No aortic stenosis or regurgitation. Mild mitral regurgitation. Mild tricuspid regurgitation. Port CXR 6/14: Bilateral hilar fullness and lower lobe opacification with silhouetting of the left hemidiaphragm. Port Abd X-ray 6/14:  No free air under diaphragm. Right hip hemiarthroplasty. Bowel gas pattern nonobstructive.  MICROBIOLOGY: MRSA PCR 6/14 >> (-) Blood Ctx x2 6/14 >> (-) Urine Ctx 6/14 >> (-)  ANTIBIOTICS: Tressie Ellis 6/14 >> Vancomycin 6/14 >>  SIGNIFICANT EVENTS: 6/14 - Admit  LINES/TUBES: PIV x3  DISCUSSION:  65 year old female presenting with shock which is gradually resolving and diabetic ketoacidosis. Suspect underlying sepsis. Clinical status improving with correction of acidosis and hypotension. Elevated cardiac biomarkers suspicious for demand ischemia. Continuing broad-spectrum antibiotics while awaiting culture results. Had worsening resp status on 6/16 with pulm edema.   Also, pt with AKI on top of CKD with metabolic acidosis.   ASSESSMENT / PLAN:  PULMONARY A: Acute hypoxemic respiratory failure 2/2 pulm edema, possible L base HCAP  P:   Continue BiPAP while we attempt diureses with lasix. Try to wean off bipap and try high flow o2 Pulmonary hygiene  Continuous Pulse Ox Supplemental O2  Titrate O2 for sat of 88-92%. Full code, if deteriorates will intubate.  CARDIOVASCULAR A:  Shock - Likely combination of hypovolemia & sepsis - imprioved. HTNsive now.  Acute Pulm edema Elevated Troponin I - Likely due to shock & acidosis. H/O Hyperlipidemia H/O CAD P:  Monitoring on telemetry. Vitals per unit protocol. Echo noted EF 65% and PAP of 48. On prn labetalol.  Pt was on ISMN, coreg, lisinopril, norvasc, as an out pt. Once taking PO, will add BP meds.  Consider nitro drip also is remains elevated despite labetalol.   RENAL A:   Acute on Chronic Renal Failure - Baseline creatinine 1.9-2.0. CKD st IV Metabolic Acidosis - From DKA and AKI.  Better.   P:   diurescing well on lasix drip. Cont lasix drip at 10 mg/hr. . Check K q 6 hrs.  Trending UOP. Monitoring electrolytes & renal function daily. Replacing electrolytes as indicated. Son OK with HD if needed.   GASTROINTESTINAL A:   Abdominal Pain - Possible pancreatitis with elevated Lipase. Bilirubin elevated. H/O GERD  P:   Start TF via dubhoff.   HEMATOLOGIC/ONCOLOGIC A:   Anemia - Near baseline anemia. No signs of active bleeding. Thrombocytopenia - Slightly above baseline due to hemoconcentration. H/O MGUS  P:  Trending cell counts daily w/ CBC SCDs Heparin [place] q8hr  INFECTIOUS A:   Sepsis - Possible HCAP. Urine and blood (-) so far.   P:   Empiric Vancomycin & Fortaz > if CXR is better, will deescalate abx.  Trending Procalcitonin per algorithm. Still elevated on 6/19 F/u culture data  Urine Strep (-);  Legionella Antigen pending   ENDOCRINE A:   DKA v HHS-- NO ketones, did initially have anion gap acidosis, now resolved.   DM   P:   Cont sliding scale  NEUROLOGIC A:   Acute Encephalopathy - Likely metabolic. Improving. H/O Depression H/O TIA  P:   Monitor closely in ICU. (-) lateralizing signs.  PRN Ativan for anxiety.  FAMILY  - Updates: Per patient's son, full code status.  Updated Quillian Quince (son, Media planner) and another son and ex-husband.    Critical Care time with this patient today : 33 minutes.  Monica Becton, MD 06/18/2016, 10:29 AM [place] [facility] Pager [phone] After 3 pm or if no answer, call [phone]

## 2016-06-18 NOTE — Progress Notes (Addendum)
Patient is from [facility]. Per records, Patient's sister does not want her to return to [facility] upon discharge. Patient's son is HCPOA and will need to make a decision regarding return to facility or to seek new placement. CSWwill continue to follow and facilitate patient discharge once medically stable.       Emiliano Dyer, LCSW [facility] ED/73M Clinical Social Worker [phone]

## 2016-06-18 NOTE — Progress Notes (Signed)
[place] Progress Note Patient Name: KENSEY PORTMANN DOB: 09/02/1951 MRN: [id number]   Date of Service  06/18/2016  HPI/Events of Note  K 2.8, Creatinine 2.36.  Wearing BiPAP.  eICU Interventions  Will give IV KCL x 4 runs.     Intervention Category Major Interventions: Other:  Vivan Agostino 06/18/2016, 1:15 AM

## 2016-06-18 NOTE — Evaluation (Signed)
Physical Therapy Evaluation Patient Details Name: [name] MRN: [id number] DOB: [birth year] Today's Date: 06/18/2016   History of Present Illness  Pt readmitted from SNF with sepsis and respiratory failure. Pt placed on bipap. Pt with recent rt hip fx and hemiarthroplasty on 06/08/16. PMH - DM, CAD, CKD, falls, HTN  Clinical Impression  Pt admitted with above diagnosis and presents to PT with functional limitations due to deficits listed below (See PT problem list). Pt needs skilled PT to maximize independence and safety to allow discharge to return to SNF. Pt is extremely weak and expect progress will be slow.     Follow Up Recommendations SNF    Equipment Recommendations  None recommended by PT    Recommendations for Other Services       Precautions / Restrictions Precautions Precautions: Fall;Posterior Hip Precaution Booklet Issued: Yes (comment) Precaution Comments: Placed hip precaution sheet over head of bed. Restrictions RLE Weight Bearing: Weight bearing as tolerated      Mobility  Bed Mobility Overal bed mobility: Needs Assistance Bed Mobility: Supine to Sit;Sit to Supine     Supine to sit: +2 for physical assistance;Total assist Sit to supine: +2 for physical assistance;Total assist   General bed mobility comments: Assist with all aspects.  Transfers                    Ambulation/Gait                Stairs            Wheelchair Mobility    Modified Rankin (Stroke Patients Only)       Balance Overall balance assessment: Needs assistance Sitting-balance support: Feet unsupported;No upper extremity supported Sitting balance-Leahy Scale: Zero Sitting balance - Comments: Sat EOB x 1 minute with total assist of 2. Pt unable to hold trunk or head erect                                     Pertinent Vitals/Pain Pain Assessment: Faces Faces Pain Scale: No hurt    Home Living Family/patient expects to be  discharged to:: Skilled nursing facility                      Prior Function Level of Independence: Needs assistance   Gait / Transfers Assistance Needed: Amb short distances with walker and assist on 6/12 after hip fx. Prior to hip fx pt was independent.            Hand Dominance   Dominant Hand: Right    Extremity/Trunk Assessment   Upper Extremity Assessment: RUE deficits/detail;LUE deficits/detail RUE Deficits / Details: Gross weakness <2/5     LUE Deficits / Details: Gross weakness <2/5   Lower Extremity Assessment: RLE deficits/detail;LLE deficits/detail RLE Deficits / Details: Gross weakness <2/5 LLE Deficits / Details: Gross weakness <2/5     Communication   Communication: HOH  Cognition Arousal/Alertness: Lethargic Behavior During Therapy: Flat affect Overall Cognitive Status: Difficult to assess                      General Comments      Exercises        Assessment/Plan    PT Assessment Patient needs continued PT services  PT Diagnosis Generalized weakness;Difficulty walking   PT Problem List Decreased strength;Decreased activity tolerance;Decreased balance;Decreased mobility;Decreased cognition  PT Treatment Interventions DME  instruction;Gait training;Functional mobility training;Therapeutic activities;Therapeutic exercise;Balance training;Cognitive remediation;Patient/family education   PT Goals (Current goals can be found in the Care Plan section) Acute Rehab PT Goals PT Goal Formulation: Patient unable to participate in goal setting Time For Goal Achievement: 07/02/16 Potential to Achieve Goals: Fair    Frequency Min 2X/week   Barriers to discharge        Co-evaluation               End of Session Equipment Utilized During Treatment: Oxygen Activity Tolerance: Patient limited by fatigue;Patient limited by lethargy Patient left: in bed;with call bell/phone within reach Nurse Communication: Mobility status          Time: [id number] PT Time Calculation (min) (ACUTE ONLY): 10 min   Charges:   PT Evaluation $PT Eval Moderate Complexity: 1 Procedure     PT G Codes:        Allon Costlow Jul 01, 2016, 3:55 PM [facility] PT [phone]

## 2016-06-18 NOTE — Progress Notes (Signed)
CRITICAL VALUE ALERT  Critical value received:  Potassium 2.5  Date of notification:  06/18/2016  Time of notification:  1915  Critical value read back: Yes  Nurse who received alert:  Porfirio Oar RN  MD notified (1st page):  CCM  Time of first page:  1925  MD notified (2nd page):  Time of second page:  Responding MD:  CCM  Time MD responded:  [phone]

## 2016-06-18 NOTE — Progress Notes (Signed)
[facility] Progress Note Patient Name: [name] DOB: Nov 19, 1951 MRN: [id number]   Date of Service  06/18/2016  HPI/Events of Note  K 2.5  eICU Interventions  Hold lasix drip K runs X 6 Recheck BMP     Intervention Category Intermediate Interventions: Electrolyte abnormality - evaluation and management  Tawnya Pujol 06/18/2016, 7:48 PM

## 2016-06-19 DIAGNOSIS — J810 Acute pulmonary edema: Secondary | ICD-10-CM | POA: Diagnosis present

## 2016-06-19 LAB — BASIC METABOLIC PANEL
ANION GAP: 9 (ref 5–15)
BUN: 68 mg/dL — ABNORMAL HIGH (ref 6–20)
CO2: 34 mmol/L — AB (ref 22–32)
Calcium: 9.3 mg/dL (ref 8.9–10.3)
Chloride: 97 mmol/L — ABNORMAL LOW (ref 101–111)
Creatinine, Ser: 2.67 mg/dL — ABNORMAL HIGH (ref 0.44–1.00)
GFR calc Af Amer: 20 mL/min — ABNORMAL LOW (ref >60)
GFR calc non Af Amer: 18 mL/min — ABNORMAL LOW (ref >60)
GLUCOSE: 309 mg/dL — AB (ref 65–99)
POTASSIUM: 3.5 mmol/L (ref 3.5–5.1)
Sodium: 140 mmol/L (ref 135–145)

## 2016-06-19 LAB — GLUCOSE, CAPILLARY
GLUCOSE-CAPILLARY: 176 mg/dL — AB (ref 65–99)
GLUCOSE-CAPILLARY: 335 mg/dL — AB (ref 65–99)
GLUCOSE-CAPILLARY: 352 mg/dL — AB (ref 65–99)
GLUCOSE-CAPILLARY: 364 mg/dL — AB (ref 65–99)
GLUCOSE-CAPILLARY: 98 mg/dL (ref 65–99)
Glucose-Capillary: 332 mg/dL — ABNORMAL HIGH (ref 65–99)
Glucose-Capillary: 148 mg/dL — ABNORMAL HIGH (ref 65–99)

## 2016-06-19 LAB — MAGNESIUM
MAGNESIUM: 2 mg/dL (ref 1.7–2.4)
Magnesium: 2 mg/dL (ref 1.7–2.4)
Magnesium: 2.2 mg/dL (ref 1.7–2.4)
Magnesium: 2.2 mg/dL (ref 1.7–2.4)

## 2016-06-19 LAB — PHOSPHORUS
PHOSPHORUS: 1.6 mg/dL — AB (ref 2.5–4.6)
PHOSPHORUS: 2 mg/dL — AB (ref 2.5–4.6)
PHOSPHORUS: 3.4 mg/dL (ref 2.5–4.6)
Phosphorus: 2.2 mg/dL — ABNORMAL LOW (ref 2.5–4.6)

## 2016-06-19 LAB — POTASSIUM: Potassium: 2.8 mmol/L — ABNORMAL LOW (ref 3.5–5.1)

## 2016-06-19 MED ORDER — NITROGLYCERIN 0.4 MG SL SUBL
0.4000 mg | SUBLINGUAL_TABLET | SUBLINGUAL | Status: DC | PRN
  Administered 2016-06-27 (×3): 0.4 mg via SUBLINGUAL
  Filled 2016-06-19: qty 1

## 2016-06-19 MED ORDER — INSULIN GLARGINE 100 UNIT/ML ~~LOC~~ SOLN
10.0000 [IU] | Freq: Every day | SUBCUTANEOUS | Status: DC
  Administered 2016-06-19 – 2016-06-21 (×3): 10 [IU] via SUBCUTANEOUS
  Filled 2016-06-19 (×3): qty 0.1

## 2016-06-19 MED ORDER — ACETAMINOPHEN 325 MG PO TABS
650.0000 mg | ORAL_TABLET | Freq: Four times a day (QID) | ORAL | Status: DC | PRN
  Filled 2016-06-19: qty 2

## 2016-06-19 MED ORDER — CLOPIDOGREL BISULFATE 75 MG PO TABS
75.0000 mg | ORAL_TABLET | Freq: Every day | ORAL | Status: DC
  Administered 2016-06-19 – 2016-06-26 (×8): 75 mg via ORAL
  Filled 2016-06-19 (×8): qty 1

## 2016-06-19 MED ORDER — ISOSORBIDE MONONITRATE ER 60 MG PO TB24
60.0000 mg | ORAL_TABLET | Freq: Every day | ORAL | Status: DC
  Administered 2016-06-19 – 2016-07-04 (×16): 60 mg via ORAL
  Filled 2016-06-19 (×4): qty 1
  Filled 2016-06-19 (×2): qty 2
  Filled 2016-06-19 (×2): qty 1
  Filled 2016-06-19 (×2): qty 2
  Filled 2016-06-19 (×2): qty 1
  Filled 2016-06-19: qty 2
  Filled 2016-06-19 (×3): qty 1

## 2016-06-19 MED ORDER — CARVEDILOL 3.125 MG PO TABS
3.1250 mg | ORAL_TABLET | Freq: Two times a day (BID) | ORAL | Status: DC
  Administered 2016-06-19 – 2016-07-04 (×29): 3.125 mg via ORAL
  Filled 2016-06-19 (×31): qty 1

## 2016-06-19 MED ORDER — INSULIN ASPART 100 UNIT/ML ~~LOC~~ SOLN
3.0000 [IU] | SUBCUTANEOUS | Status: DC
  Administered 2016-06-19 – 2016-06-21 (×9): 3 [IU] via SUBCUTANEOUS

## 2016-06-19 MED ORDER — TOPIRAMATE 25 MG PO TABS
100.0000 mg | ORAL_TABLET | Freq: Two times a day (BID) | ORAL | Status: DC
  Administered 2016-06-19 – 2016-06-24 (×10): 100 mg via ORAL
  Filled 2016-06-19 (×3): qty 1
  Filled 2016-06-19 (×2): qty 4
  Filled 2016-06-19 (×5): qty 1

## 2016-06-19 MED ORDER — ENOXAPARIN SODIUM 30 MG/0.3ML ~~LOC~~ SOLN
30.0000 mg | SUBCUTANEOUS | Status: DC
  Administered 2016-06-19: 30 mg via SUBCUTANEOUS
  Filled 2016-06-19: qty 0.3

## 2016-06-19 MED ORDER — ENSURE ENLIVE PO LIQD: 237.0000 mL | Freq: Two times a day (BID) | ORAL | Status: DC

## 2016-06-19 MED ORDER — FLUOXETINE HCL 20 MG PO CAPS
20.0000 mg | ORAL_CAPSULE | Freq: Every day | ORAL | Status: DC
  Administered 2016-06-19 – 2016-07-04 (×16): 20 mg via ORAL
  Filled 2016-06-19 (×16): qty 1

## 2016-06-19 MED ORDER — GABAPENTIN 300 MG PO CAPS
600.0000 mg | ORAL_CAPSULE | Freq: Three times a day (TID) | ORAL | Status: DC
  Administered 2016-06-19 – 2016-06-21 (×6): 600 mg via ORAL
  Filled 2016-06-19 (×6): qty 2

## 2016-06-19 MED ORDER — PANTOPRAZOLE SODIUM 40 MG PO TBEC: 40.0000 mg | DELAYED_RELEASE_TABLET | Freq: Every day | ORAL | Status: DC

## 2016-06-19 MED ORDER — GLUCERNA 1.2 CAL PO LIQD
1000.0000 mL | ORAL | Status: DC
  Administered 2016-06-19 – 2016-06-22 (×4): 1000 mL
  Filled 2016-06-19 (×6): qty 1000

## 2016-06-19 MED ORDER — VITAMIN D 1000 UNITS PO TABS
5000.0000 [IU] | ORAL_TABLET | Freq: Every day | ORAL | Status: DC
  Administered 2016-06-19 – 2016-07-04 (×16): 5000 [IU] via ORAL
  Filled 2016-06-19 (×16): qty 5

## 2016-06-19 MED ORDER — SIMVASTATIN 20 MG PO TABS
20.0000 mg | ORAL_TABLET | Freq: Every day | ORAL | Status: DC
  Administered 2016-06-19 – 2016-07-03 (×15): 20 mg via ORAL
  Filled 2016-06-19 (×15): qty 1

## 2016-06-19 MED ORDER — PANTOPRAZOLE SODIUM 40 MG PO PACK
40.0000 mg | PACK | Freq: Every day | ORAL | Status: DC
  Administered 2016-06-19 – 2016-06-24 (×6): 40 mg
  Filled 2016-06-19 (×6): qty 20

## 2016-06-19 MED ORDER — DEXTROSE 10 % IV SOLN: INTRAVENOUS | Status: DC | PRN

## 2016-06-19 MED ORDER — BLISTEX MEDICATED EX OINT
1.0000 "application " | TOPICAL_OINTMENT | CUTANEOUS | Status: DC | PRN
  Filled 2016-06-19: qty 6.3

## 2016-06-19 MED ORDER — FENOFIBRATE 160 MG PO TABS
160.0000 mg | ORAL_TABLET | Freq: Every day | ORAL | Status: DC
  Administered 2016-06-19 – 2016-07-03 (×14): 160 mg via ORAL
  Filled 2016-06-19 (×16): qty 1

## 2016-06-19 MED ORDER — POTASSIUM CHLORIDE 20 MEQ/15ML (10%) PO SOLN
40.0000 meq | ORAL | Status: AC
  Administered 2016-06-19 (×3): 40 meq
  Filled 2016-06-19 (×3): qty 30

## 2016-06-19 NOTE — Progress Notes (Signed)
Inpatient Diabetes Program Recommendations  AACE/ADA: New Consensus Statement on Inpatient Glycemic Control (2015)  Target Ranges:  Prepandial:   less than 140 mg/dL      Peak postprandial:   less than 180 mg/dL (1-2 hours)      Critically ill patients:  140 - 180 mg/dL   Lab Results  Component Value Date   GLUCAP 352* 06/19/2016   HGBA1C 6.8* 06/13/2016    Review of Glycemic Control Results for Latoya, [name] (MRN [id number]) as of 06/19/2016 10:56  Ref. Range 06/18/2016 15:21 06/18/2016 19:28 06/19/2016 00:10 06/19/2016 03:19 06/19/2016 07:18  Glucose-Capillary Latest Ref Range: 65-99 mg/dL 224 (H) 279 (H) 364 (H) 332 (H) 352 (H)   Blood sugars increased.  Please consider ICU glycemic control protocol-Phase 2/IV insulin.  Will discuss with RN.  Thanks, Adah Perl, RN, BC-ADM Inpatient Diabetes Coordinator Pager [phone] (8a-5p)

## 2016-06-19 NOTE — NC FL2 (Signed)
[place] LEVEL OF CARE SCREENING TOOL     IDENTIFICATION  Patient Name: [name] Birthdate: Dec 12, 1951 Sex: female Admission Date (Current Location): 06/13/2016  [facility] and [place] Number:  Herbalist and Address:  [facility] [place], [place] [address] [place], [place] [address]      Provider Number: [id number]  Attending Physician Name and Address:  Rush Farmer, MD  Relative Name and Phone Number:       Current Level of Care: Hospital Recommended Level of Care: [place] Prior Approval Number:    Date Approved/Denied:   PASRR Number: [id number]  Discharge Plan: SNF    Current Diagnoses: Patient Active Problem List   Diagnosis Date Noted  . Acute pulmonary edema (HCC)   . Acute respiratory failure with hypoxemia ([place])   . Diabetic ketoacidosis without coma associated with type 2 diabetes mellitus ([place])   . AKI (acute kidney injury) ([place])   . Metabolic acidosis   . DKA (diabetic ketoacidoses) ([place]) 06/13/2016  . CKD (chronic kidney disease) stage 4, GFR 15-29 ml/min (HCC) 06/05/2016  . Fracture of femoral neck, right ([place]) 06/05/2016  . DM w/o complication type II ([place]) 06/05/2016  . HLD (hyperlipidemia) 06/05/2016  . Depressive disorder 06/05/2016  . HTN (hypertension) 06/05/2016  . CAD (coronary artery disease) 06/05/2016  . Fibromyalgia 06/05/2016  . GERD (gastroesophageal reflux disease) 06/05/2016  . Head trauma 06/05/2016  . Decreased hearing of both ears 06/05/2016  . Hip fracture ([place]) 06/05/2016  . Femoral neck fracture ([place]) 06/05/2016  . Symptomatic anemia 03/09/2016  . Acute MI ([place]) 02/07/2016  . NSTEMI (non-ST elevated myocardial infarction) ([place]) 02/07/2016  . Chronic kidney disease (CKD), stage IV (severe) ([place]) 06/26/2015  . Chest pain with high risk for cardiac etiology 06/24/2015  . Low hemoglobin 02/02/2014  . Abdominal pain, epigastric 02/02/2014  . Black stools  02/02/2014  . Anemia 01/25/2014  . Renal failure, acute on chronic (HCC) 01/25/2014  . MGUS (monoclonal gammopathy of unknown significance) 11/02/2013  . OSTEOARTHROS UNSPEC GEN/LOC OTH SPEC SITES 07/05/2010  . HEAD TRAUMA 05/04/2010  . CELLULITIS AND ABSCESS OF UNSPECIFIED SITE 05/02/2010  . INSOMNIA, CHRONIC 04/06/2010  . HYPERKALEMIA 03/02/2010  . LEG CRAMPS 02/28/2010  . GASTROENTERITIS WITHOUT DEHYDRATION 02/21/2010  . HAIR LOSS 01/31/2010  . PROCTITIS 12/29/2009  . Chronic kidney disease 12/28/2009  . PRURITUS 10/04/2009  . ULCER, RECTUM 09/28/2009  . UNSPECIFIED CONSTIPATION 09/17/2009  . 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 FATIGUE 08/05/2009  . ABDOMINAL PAIN, UNSPECIFIED SITE 08/05/2009  . TIA 05/17/2009  . 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 ANXIETY 06/30/2008  . URGENCY OF URINATION 06/30/2008  . DIABETES MELLITUS, II, COMPLICATIONS [id number]  . HYPERLIPIDEMIA 02/27/2007  . Anemia 02/27/2007  . DEPRESSIVE DISORDER, NOS 02/27/2007  . HYPERTENSION, BENIGN SYSTEMIC 02/27/2007  . CORONARY, ARTERIOSCLEROSIS 02/27/2007  . GASTROESOPHAGEAL REFLUX, NO ESOPHAGITIS 02/27/2007  . BACK PAIN, LOW 02/27/2007  . FIBROMYALGIA, FIBROMYOSITIS 02/27/2007    Orientation RESPIRATION BLADDER Height & Weight     Self, Time  Normal Continent Weight: 107 lb 2.3 oz (48.6 kg) Height:  5\' 3"  (160 cm)  BEHAVIORAL SYMPTOMS/MOOD NEUROLOGICAL BOWEL NUTRITION STATUS   (none)  (None) Continent  (NPO)  AMBULATORY STATUS COMMUNICATION OF NEEDS Skin   Extensive Assist Verbally Surgical wounds                       Personal Care Assistance Level of Assistance  Bathing, Feeding, Dressing Bathing Assistance: Limited assistance Feeding assistance: Independent Dressing Assistance: Limited assistance     Functional Limitations Info  Sight, Hearing, Speech Sight Info: Adequate Hearing Info: Adequate Speech Info: Adequate    SPECIAL CARE FACTORS FREQUENCY  PT  (By licensed PT), OT (By licensed OT)     PT Frequency: 5/ week OT Frequency: 5/ week            Contractures Contractures Info: Not present    Additional Factors Info  Code Status, Allergies, Insulin Sliding Scale       Insulin Sliding Scale Info: insulin aspart (novoLOG) injection 0-9 Units Every 4 hours Route: [place]       Current Medications (06/19/2016):  This is the current hospital active medication list Current Facility-Administered Medications  Medication Dose Route Frequency Provider Last Rate Last Dose  . acetaminophen (TYLENOL) suppository 650 mg  650 mg Rectal Q4H PRN Chesley Mires, MD   650 mg at 06/17/16 2332  . acetaminophen (TYLENOL) tablet 650 mg  650 mg Oral Q4H PRN Laverle Hobby, MD   650 mg at 06/19/16 0332  . antiseptic oral rinse (CPC / CETYLPYRIDINIUM CHLORIDE 0.05%) solution 7 mL  7 mL Mouth Rinse q12n4p Rush Farmer, MD   7 mL at 06/19/16 1200  . cefTAZidime (FORTAZ) 1 g in dextrose 5 % 50 mL IVPB  1 g Intravenous Q24H Rolla Flatten, RPH   1 g at 06/18/16 1533  . chlorhexidine (PERIDEX) 0.12 % solution 15 mL  15 mL Mouth Rinse BID Rush Farmer, MD   15 mL at 06/19/16 0915  . dextrose 10 % infusion   Intravenous Continuous PRN [place], MD      . feeding supplement (PRO-STAT SUGAR FREE 64) liquid 30 mL  30 mL Per Tube BID [place], MD   30 mL at 06/19/16 0915  . feeding supplement (VITAL HIGH PROTEIN) liquid 1,000 mL  1,000 mL Per Tube Q24H [place], MD   40 mL/hr at 06/19/16 1030  . fentaNYL (SUBLIMAZE) injection 25-50 mcg  25-50 mcg Intravenous Q1H PRN Rush Farmer, MD   50 mcg at 06/18/16 2317  . Gerhardt's butt cream   Topical BID Rush Farmer, MD      . heparin injection 5,000 Units  5,000 Units Subcutaneous [place], MD   5,000 Units at 06/19/16 0532  . insulin aspart (novoLOG) injection 0-9 Units  0-9 Units Subcutaneous Q4H Rush Farmer, MD   7 Units at 06/19/16 1151  . insulin  aspart (novoLOG) injection 3 Units  3 Units Subcutaneous Q4H [place], MD   3 Units at 06/19/16 1152  . insulin glargine (LANTUS) injection 10 Units  10 Units Subcutaneous Daily [place], MD   10 Units at 06/19/16 1214  . labetalol (NORMODYNE,TRANDATE) injection 10 mg  10 mg Intravenous Q2H PRN Colbert Coyer, MD   10 mg at 06/18/16 1014  . LORazepam (ATIVAN) injection 0.5-1 mg  0.5-1 mg Intravenous Q4H PRN Rush Farmer, MD   1 mg at 06/17/16 1721  . ondansetron (ZOFRAN) injection 4 mg  4 mg Intravenous Q4H PRN Laverle Hobby, MD   4 mg at 06/19/16 0746  . vancomycin (VANCOCIN) IVPB 750 mg/150 ml premix  750 mg Intravenous Q48H Rolla Flatten, RPH   750 mg at 06/18/16 1157     Discharge Medications: Please  see discharge summary for a list of discharge medications.  Relevant Imaging Results:  Relevant Lab Results:   Additional Information SSN:  [id number]  Samule Dry, LCSW

## 2016-06-19 NOTE — Progress Notes (Signed)
06/19/2016 11:12 AM Nursing note MD paged and made aware of DM Coordinator reccomendations for resuming insulin gtt. Dr. Corrie Dandy Paged and made aware. MD to contact pharmacy and add basal insulin. Verbal orders to continue to observe CBG and continue q4h cbg and ssi coverage at this time. Orders enacted. Will continue to closely monitor patient.  Eular Panek, Arville Lime

## 2016-06-19 NOTE — Progress Notes (Signed)
Initial Nutrition Assessment  DOCUMENTATION CODES:   Not applicable  INTERVENTION:    D/C Vital High Protein formula   D/C Prostat liquid protein 30 ml BID   Initiate Glucerna 1.2 formula at 15 ml/hr and increase by 10 ml every 4 hours to goal rate of 55 ml/hr  Above TF regimen to provide 1584 kcals, 79 gm protein, 1063 ml of free water  NUTRITION DIAGNOSIS:   Inadequate oral intake related to inability to eat as evidenced by NPO status  GOAL:   Patient will meet greater than or equal to 90% of their needs  MONITOR:   TF tolerance, Diet advancement, Labs, Weight trends, I & O's  REASON FOR ASSESSMENT:   Consult Enteral/tube feeding initiation and management  ASSESSMENT:   65 year old Female with PMH as below, which includes DM, CAD, HTN, and recent admission for R femoral neck fracture s/p arthoplasty 6/8. She had suffered several fall prior to the fall that caused the fracture. Post operative course was fairly uncomplicated with the exception of some mild anemia requiring PRBC transfusion and UTI treated with levaquin. She was discharged to [facility] for rehabilitation on 6/12. 6/14 she returned to ER with chief complaint of AMS with associated hypotension. In the emergency department she was noted to be lethargic and confused. She was complaining of abdominal pain. She was hypotensive and hypothermic with elevated WBC. Glucose noted to be markedly elevated, however urine ketones negative. With metabolic acidosis. Lactic acid wnl. Due to persistent hypotension and concern for septic shock PCCM asked to admit.   Pt encephalopathic >> unable to obtain nutrition hx. Chart reviewed >> pt admitted from [facility]. Transferred from 2S-SICU to 2C-Stepdown today. Vital AF 1.2 formula was previously infusing at 40 ml/hr via small bore feeding tube (tip in gastric body) >> currently off. Also receiving Prostat liquid protein 30 ml BID via tube. Unable to complete  Nutrition-Focused physical exam at this time.   Diet Order:  Diet NPO time specified  Skin:  Reviewed, no issues  Last BM:  6/20  Height:   Ht Readings from Last 1 Encounters:  06/13/16 5\' 3"  (1.6 m)    Weight:   Wt Readings from Last 1 Encounters:  06/19/16 107 lb 2.3 oz (48.6 kg)    Ideal Body Weight:  52 kg  BMI:  Body mass index is 18.98 kg/(m^2).  Estimated Nutritional Needs:   Kcal:  1500-1700  Protein:  70-80 gm  Fluid:  1.5-1.7 L  EDUCATION NEEDS:   No education needs identified at this time  Arthur Holms, RD, LDN Pager #: [phone] After-Hours Pager #: [phone]

## 2016-06-19 NOTE — Progress Notes (Signed)
PULMONARY / CRITICAL CARE MEDICINE   Name: [name] MRN: [id number] DOB: [birth year]    ADMISSION DATE:  06/13/2016 CONSULTATION DATE:  06/13/2016  REFERRING MD:  Theotis Burrow, M.D. / EDP  CHIEF COMPLAINT:  Acute Encephalopathy  HISTORY OF PRESENT ILLNESS:   65 year old female with PMH as below, which includes DM, CAD, HTN, and recent admission for R femoral neck fracture s/p arthoplasty 6/8. She had suffered several fall prior to the fall that caused the fracture. Post operative course was fairly uncomplicated with the exception of some mild anemia requiring PRBC transfusion and UTI treated with levaquin. She was discharged to [facility] for rehabilitation on 6/12. 6/14 she returned to ER with chief complaint of AMS with associated hypotension. In the emergency department she was noted to be lethargic and confused. She was complaining of abdominal pain. She was hypotensive and hypothermic with elevated WBC. Glucose noted to be markedly elevated, however urine ketones negative. With metabolic acidosis. Lactic acid wnl. Due to persistent hypotension and concern for septic shock PCCM asked to admit.   SUBJECTIVE:  Bipap transitioned to High Flow [place]. She was on 40% all day and desaturated to 80s while asleep this am.  Titrated up to 60% and o2 sats 100% Lasix drip dcd 2/2 dropping K. More awake.  Had nausea with TF.  Comfortable.   VITAL SIGNS: BP 155/69 mmHg  Pulse 90  Temp(Src) 97.7 F (36.5 C) (Oral)  Resp 22  Ht 5\' 3"  (1.6 m)  Wt 48.6 kg (107 lb 2.3 oz)  BMI 18.98 kg/m2  SpO2 99%  HEMODYNAMICS:    VENTILATOR SETTINGS: Vent Mode:  [-]  FiO2 (%):  [40 %-60 %] 60 %  INTAKE / OUTPUT: I/O last 3 completed shifts: In: 2660 [I.V.:230; NG/GT:1180; IV Piggyback:1250] Out: [id number] [Urine:5920]  PHYSICAL EXAMINATION: General:  Chronically ill appearing female.  Less SOB. On 60% Fio2. More comfortable.  Neuro:  More awake.  follows commands, less confusion.  Grossly  nonfocal. CN grossly in tact. HEENT:  Dry mucus membranes. No scleral injection or icterus.  Cardiovascular:  Regular rate. Trace edema. Normal S1 & S2. Lungs:  Coarse breath sounds bilaterally. Normal work of breathing on supplemental oxygen. Abdomen:  Soft, non-tender, non-distended Musculoskeletal:  No acute deformity. Gr 1 edema  Skin:  Warm & dry. No rash on exposed skin.  LABS:  BMET  Recent Labs Lab 06/17/16 0010  06/18/16 0020  06/18/16 1847 06/19/16 0030 06/19/16 0501  NA 138  --  137  --   --   --  140  K 2.9*  < > 2.8*  < > 2.5* 2.8* 3.5  CL 107  --  100*  --   --   --  97*  CO2 18*  --  25  --   --   --  34*  BUN 56*  --  55*  --   --   --  68*  CREATININE 2.54*  --  2.36*  --   --   --  2.67*  GLUCOSE 204*  --  168*  --   --   --  309*  < > = values in this interval not displayed.  Electrolytes  Recent Labs Lab 06/17/16 0010  06/18/16 0020  06/18/16 1847 06/19/16 0029 06/19/16 0501  CALCIUM 8.8*  --  9.2  --   --   --  9.3  MG 2.5*  < > 2.1  < > 1.9 2.0 2.2  PHOS  4.3  < > 3.4  < > 2.9 3.4 2.2*  < > = values in this interval not displayed.  CBC  Recent Labs Lab 06/15/16 0203 06/17/16 0010 06/18/16 0020  WBC 8.6 17.3* 13.9*  HGB 10.2* 11.1* 10.1*  HCT 30.4* 32.4* 29.8*  PLT 176 196 225    Coag's No results for input(s): APTT, INR in the last 168 hours.  Sepsis Markers  Recent Labs Lab 06/13/16 1630 06/13/16 1933 06/13/16 2146 06/14/16 0320 06/15/16 0203  LATICACIDVEN 1.99 1.39  --   --   --   PROCALCITON  --   --  2.55 2.90 2.18    ABG  Recent Labs Lab 06/15/16 1001 06/15/16 2328  PHART 7.363 7.320*  PCO2ART 25.5* 31.7*  PO2ART 45.0* 48.0*    Liver Enzymes  Recent Labs Lab 06/13/16 1618 06/14/16 0320  AST 44* 40  ALT 17 14  ALKPHOS 78 65  BILITOT 2.3* 0.8  ALBUMIN 2.0* 1.8*    Cardiac Enzymes  Recent Labs Lab 06/14/16 2030 06/15/16 0203 06/15/16 0624  TROPONINI 3.80* 2.85* 2.64*    Glucose  Recent  Labs Lab 06/18/16 1149 06/18/16 1521 06/18/16 1928 06/19/16 0010 06/19/16 0319 06/19/16 0718  GLUCAP 223* 224* 279* 364* 332* 352*    Imaging Dg Abd Portable 1v  06/18/2016  CLINICAL DATA:  Feeding tube placement EXAM: PORTABLE ABDOMEN - 1 VIEW COMPARISON:  06/13/2016 FINDINGS: Feeding tube coils in the proximal stomach with the tip in the distal stomach. Nonobstructive bowel gas pattern. IMPRESSION: Feeding tube tip in the distal stomach. Electronically Signed   By: Rolm Baptise M.D.   On: 06/18/2016 13:00    STUDIES:  TTE (06/24/15): EF 55-60%. LA & RA normal in size. RV normal in size and function. No aortic stenosis or regurgitation. Mild mitral regurgitation. Mild tricuspid regurgitation. Port CXR 6/14: Bilateral hilar fullness and lower lobe opacification with silhouetting of the left hemidiaphragm. Port Abd X-ray 6/14:  No free air under diaphragm. Right hip hemiarthroplasty. Bowel gas pattern nonobstructive.  MICROBIOLOGY: MRSA PCR 6/14 >> (-) Blood Ctx x2 6/14 >> (-) Urine Ctx 6/14 >> (-)  ANTIBIOTICS: Tressie Ellis 6/14 >> Vancomycin 6/14 >>  SIGNIFICANT EVENTS: 6/14 - Admit  LINES/TUBES: PIV x3  DISCUSSION:  65 year old female presenting with shock which is gradually resolving and diabetic ketoacidosis. Suspect underlying sepsis. Clinical status improving with correction of acidosis and hypotension. Elevated cardiac biomarkers suspicious for demand ischemia. Continuing broad-spectrum antibiotics while awaiting culture results. Had worsening resp status on 6/16 with pulm edema.   Also, pt with AKI on top of CKD with metabolic acidosis.   ASSESSMENT / PLAN:  PULMONARY A: Acute hypoxemic respiratory failure 2/2 pulm edema, possible L base HCAP  P:   Continue high flow o2 for now. Was on 40% all day until this am. Inc to 60% and her O21 sats now is 100%. Try to decrease Fio2 to 50%. Bipap prn and HS.  Pulmonary hygiene  Continuous Pulse Ox Supplemental O2  Titrate O2  for sat of 88-92%. Full code, if deteriorates will intubate.  CARDIOVASCULAR A:  Shock - Likely combination of hypovolemia & sepsis - imprioved. HTNsive now.  Acute Pulm edema Elevated Troponin I - Likely due to shock & acidosis. H/O Hyperlipidemia H/O CAD P:  Monitoring on telemetry. Vitals per unit protocol. Echo noted EF 65% and PAP of 48. On prn labetalol.  Pt was on ISMN, coreg, lisinopril, norvasc, as an out pt. Once taking PO, will add BP meds.  Consider nitro drip also is remains elevated despite labetalol. Will try PO meds if more awake.  Lasix drip dc'd on 6/19 2/2 hypoK.   RENAL A:   Acute on Chronic Renal Failure - Baseline creatinine 1.9-2.0. CKD st IV  P:   diurescing well on lasix drip the last 3 d. (-) 5L now. Appears euvolemic. Lasix has been dcd. Observe uo for now.  Trending UOP. Monitoring electrolytes & renal function daily. Replacing electrolytes as indicated. Son OK with HD if needed.   GASTROINTESTINAL A:   Abdominal Pain - Possible pancreatitis with elevated Lipase. Bilirubin elevated. H/O GERD  P:   cont TF via dubhoff.  Try clears if she wakes up more.   HEMATOLOGIC/ONCOLOGIC A:   Anemia - Near baseline anemia. No signs of active bleeding. Thrombocytopenia - Slightly above baseline due to hemoconcentration. H/O MGUS  P:  Trending cell counts daily w/ CBC SCDs Heparin [place] q8hr  INFECTIOUS A:   Sepsis - Possible HCAP. Urine and blood (-) so far.   P:   Empiric Vancomycin & Fortaz > if CXR is better, will deescalate abx.  Trending Procalcitonin per algorithm. Still elevated on 6/19. Check pct in am.  F/u culture data  Urine Strep (-);  Legionella Antigen pending   ENDOCRINE A:   DKA v HHS-- NO ketones, did initially have anion gap acidosis, now resolved.   DM   P:   CBG has been trending in the 300 mg% today. We started TF the last 24 hrs. Plan to start scheduled novolog q4-6 hrs as she is on TF. Observe CBG. Holding off on  insulin drip.  Cont sliding scale as well.  D/w pharmacy.   NEUROLOGIC A:   Acute Encephalopathy - Likely metabolic. Improving. H/O Depression H/O TIA  P:   Monitor closely in SDU Avoid benzos for now 2/2 on and off confusion and sedation.  (-) lateralizing signs.   FAMILY  - Updates: Per patient's son, full code status.  Updated Quillian Quince (son, decision maker) and another son and ex-husband on 6/19. No family at bedside.   Pt still needs close f/u with resp status. Transfer to SDU today. If no issues overnight, will go to [facility] service in am.  I discussed the case with Dr. Dia Crawford this am.    Critical Care time with this patient today : 33 minutes.  Monica Becton, MD 06/19/2016, 9:06 AM [facility] Pager [phone] After 3 pm or if no answer, call [phone]

## 2016-06-19 NOTE — Progress Notes (Signed)
eLink Physician-Brief Progress Note Patient Name: AUBRE MARTS DOB: Jul 19, 1951 MRN: [id number]   Date of Service  06/19/2016  HPI/Events of Note  K still low.  eICU Interventions  Will give additional KCL via tube.     Intervention Category Major Interventions: Other:  Parys Elenbaas 06/19/2016, 2:06 AM

## 2016-06-19 NOTE — Progress Notes (Signed)
Patient currently on HFNC 30L and 60% with sats of 97. All vitals are stable and patient in no distress. BIPAP in room on standby but not needed at this time.

## 2016-06-20 ENCOUNTER — Inpatient Hospital Stay (HOSPITAL_COMMUNITY): Payer: Commercial Managed Care - HMO

## 2016-06-20 DIAGNOSIS — A419 Sepsis, unspecified organism: Secondary | ICD-10-CM | POA: Diagnosis present

## 2016-06-20 DIAGNOSIS — J9600 Acute respiratory failure, unspecified whether with hypoxia or hypercapnia: Secondary | ICD-10-CM | POA: Diagnosis present

## 2016-06-20 DIAGNOSIS — R29898 Other symptoms and signs involving the musculoskeletal system: Secondary | ICD-10-CM

## 2016-06-20 LAB — MAGNESIUM
MAGNESIUM: 2.1 mg/dL (ref 1.7–2.4)
Magnesium: 2.1 mg/dL (ref 1.7–2.4)
Magnesium: 2.1 mg/dL (ref 1.7–2.4)

## 2016-06-20 LAB — CBC
HCT: 31.6 % — ABNORMAL LOW (ref 36.0–46.0)
Hemoglobin: 10.1 g/dL — ABNORMAL LOW (ref 12.0–15.0)
MCH: 29.2 pg (ref 26.0–34.0)
MCHC: 32 g/dL (ref 30.0–36.0)
MCV: 91.3 fL (ref 78.0–100.0)
PLATELETS: 242 10*3/uL (ref 150–400)
RBC: 3.46 MIL/uL — ABNORMAL LOW (ref 3.87–5.11)
RDW: 18.3 % — AB (ref 11.5–15.5)
WBC: 11.2 10*3/uL — AB (ref 4.0–10.5)

## 2016-06-20 LAB — GLUCOSE, CAPILLARY
GLUCOSE-CAPILLARY: 267 mg/dL — AB (ref 65–99)
GLUCOSE-CAPILLARY: 193 mg/dL — AB (ref 65–99)
GLUCOSE-CAPILLARY: 129 mg/dL — AB (ref 65–99)
GLUCOSE-CAPILLARY: 178 mg/dL — AB (ref 65–99)
Glucose-Capillary: 206 mg/dL — ABNORMAL HIGH (ref 65–99)
Glucose-Capillary: 126 mg/dL — ABNORMAL HIGH (ref 65–99)

## 2016-06-20 LAB — PHOSPHORUS
PHOSPHORUS: 2.1 mg/dL — AB (ref 2.5–4.6)
Phosphorus: 2 mg/dL — ABNORMAL LOW (ref 2.5–4.6)
Phosphorus: 1.7 mg/dL — ABNORMAL LOW (ref 2.5–4.6)

## 2016-06-20 LAB — BASIC METABOLIC PANEL
ANION GAP: 8 (ref 5–15)
BUN: 80 mg/dL — ABNORMAL HIGH (ref 6–20)
CALCIUM: 9.7 mg/dL (ref 8.9–10.3)
CO2: 35 mmol/L — AB (ref 22–32)
Chloride: 102 mmol/L (ref 101–111)
Creatinine, Ser: 2.83 mg/dL — ABNORMAL HIGH (ref 0.44–1.00)
GFR, EST AFRICAN AMERICAN: 19 mL/min — AB (ref >60)
GFR, EST NON AFRICAN AMERICAN: 16 mL/min — AB (ref >60)
Glucose, Bld: 209 mg/dL — ABNORMAL HIGH (ref 65–99)
Potassium: 4.5 mmol/L (ref 3.5–5.1)
Sodium: 145 mmol/L (ref 135–145)

## 2016-06-20 LAB — PROCALCITONIN: Procalcitonin: 8.76 ng/mL

## 2016-06-20 LAB — BRAIN NATRIURETIC PEPTIDE: B Natriuretic Peptide: 610.8 pg/mL — ABNORMAL HIGH (ref 0.0–100.0)

## 2016-06-20 NOTE — Progress Notes (Signed)
PROGRESS NOTE    [name]  [id number] DOB: [birth year] DOA: 06/13/2016 PCP: [name], MD  Outpatient Specialists:   Brief Narrative: 65 year old female with PMH significant for DM, CAD, HTN, and recent admission for R femoral neck fracture s/p arthoplasty 6/8. She had suffered several fall prior to the fall that caused the fracture. Post operative course was fairly uncomplicated with the exception of some mild anemia requiring PRBC transfusion and UTI treated with levaquin. She was discharged to [facility] for rehabilitation on 6/12. 6/14 she returned to ER with chief complaint of AMS with associated hypotension. In the emergency department she was noted to be lethargic and confused. She was complaining of abdominal pain. She was hypotensive and hypothermic with elevated WBC. Glucose noted to be markedly elevated, however urine ketones negative. With metabolic acidosis. Lactic acid wnl. Due to persistent hypotension and concern for septic shock PCCM was asked to admit patient.   Assessment & Plan:   Active Problems:   CAD (coronary artery disease)   DKA (diabetic ketoacidoses) (HCC)   Diabetic ketoacidosis without coma associated with type 2 diabetes mellitus ([place])   AKI (acute kidney injury) ([place])   Metabolic acidosis   Acute respiratory failure with hypoxemia (HCC)   Acute pulmonary edema (HCC)  A: Acute hypoxemic respiratory failure 2/2 pulm edema, possible L base HCAP -   Continue supplemental oxygen. Bipap prn and HS.  Pulmonary hygiene  Continuous Pulse Ox Supplemental O2  Titrate O2 for sat of 88-92%. Full code, if deteriorates will intubate.  B: Shock - Likely combination of hypovolemia & sepsis - imprioved.   C: Acute Pulm edema Elevated Troponin I - Likely due to shock & acidosis. H/O Hyperlipidemia H/O CAD Monitoring on telemetry.  Echo noted EF 65% and PAP of 48. On prn labetalol.    D: Acute on Chronic Renal Failure - Baseline  creatinine 1.9-2.0. Diuresis.  Trend UOP. Monitor electrolytes & renal function daily. Replace electrolytes as indicated.   E: Abdominal Pain - Possible pancreatitis with elevated Lipase. Bilirubin elevated. H/O GERD cont TF via dubhoff. Try clears if she wakes up more.   F: Anemia - Near baseline anemia. No signs of active bleeding. Thrombocytopenia - Slightly above baseline due to hemoconcentration. H/O MGUS  G: Sepsis - Possible HCAP. Urine and blood (-) so far.  Empiric Vancomycin & Tressie Ellis. Repeat CXR noted. Pharmacy input is highly appreciated. Check procalcitonin.  Urine Strep (-); Follow Legionella Antigen    H: Acute Encephalopathy - Likely combine toxic and metabolic. Improving. H/O Depression H/O TIA  DVT prophylaxis: [place] Heparin Code Status: Full Family Communication:  Disposition Plan: Undetermined. Likely back to SNF  Subjective: No significant history from patient.   Objective: Filed Vitals:   06/20/16 0935 06/20/16 1200 06/20/16 1517 06/20/16 1554  BP: 168/73 123/57 124/57 132/59  Pulse: 92 82 82 87  Temp:  98.6 F (37 C)  98.8 F (37.1 C)  TempSrc:  Oral  Oral  Resp: 16 22 20 20   Height:      Weight:      SpO2: 96% 100% 99% 93%    Intake/Output Summary (Last 24 hours) at 06/20/16 1832 Last data filed at 06/20/16 1800  Gross per 24 hour  Intake 1364.17 ml  Output    900 ml  Net 464.17 ml   Filed Weights   06/18/16 0500 06/19/16 0400 06/20/16 0500  Weight: 47.4 kg (104 lb 8 oz) 48.6 kg (107 lb 2.3 oz) 44.2 kg (  97 lb 7.1 oz)    Examination:  General exam: Appears calm, but acutely ill.  Respiratory system: Decreased air entry posteriorly, right worse than left. Cardiovascular system: S1 & S2   Gastrointestinal system: Abdomen is nondistended, soft and nontender.   Central nervous system: Moves all limb. Arousable.    Data Reviewed: I have personally reviewed following labs and imaging studies  CBC:  Recent Labs Lab 06/14/16 0320  06/15/16 0203 06/17/16 0010 06/18/16 0020 06/20/16 0035  WBC 8.6 8.6 17.3* 13.9* 11.2*  NEUTROABS 7.6  --   --   --   --   HGB 10.0* 10.2* 11.1* 10.1* 10.1*  HCT 30.0* 30.4* 32.4* 29.8* 31.6*  MCV 88.5 87.6 86.6 88.4 91.3  PLT 155 176 196 225 [id number]   Basic Metabolic Panel:  Recent Labs Lab 06/16/16 0005  06/17/16 0010  06/18/16 0020  06/18/16 1108 06/18/16 1847  06/19/16 0030 06/19/16 0501 06/19/16 0850 06/19/16 1515 06/20/16 0035 06/20/16 0814 06/20/16 1551  NA 140  --  138  --  137  --   --   --   --   --  140  --   --  145  --   --   K 3.2*  --  2.9*  < > 2.8*  --  3.1* 2.5*  --  2.8* 3.5  --   --  4.5  --   --   CL 113*  --  107  --  100*  --   --   --   --   --  97*  --   --  102  --   --   CO2 19*  --  18*  --  25  --   --   --   --   --  34*  --   --  35*  --   --   GLUCOSE 210*  --  204*  --  168*  --   --   --   --   --  309*  --   --  209*  --   --   BUN 55*  --  56*  --  55*  --   --   --   --   --  68*  --   --  80*  --   --   CREATININE 2.60*  --  2.54*  --  2.36*  --   --   --   --   --  2.67*  --   --  2.83*  --   --   CALCIUM 8.3*  --  8.8*  --  9.2  --   --   --   --   --  9.3  --   --  9.7  --   --   MG 1.6*  < > 2.5*  < > 2.1  < > 2.0 1.9  < >  --  2.2 2.0 2.2 2.1 2.1 2.1  PHOS 3.5  < > 4.3  < > 3.4  < > 3.5 2.9  < >  --  2.2* 2.0* 1.6* 2.0* 2.1* 1.7*  < > = values in this interval not displayed. GFR: Estimated Creatinine Clearance: 13.6 mL/min (by C-G formula based on Cr of 2.83). Liver Function Tests:  Recent Labs Lab 06/14/16 0320  AST 40  ALT 14  ALKPHOS 65  BILITOT 0.8  PROT 4.1*  ALBUMIN 1.8*   No results for input(s): LIPASE, AMYLASE in the  last 168 hours. No results for input(s): AMMONIA in the last 168 hours. Coagulation Profile: No results for input(s): INR, PROTIME in the last 168 hours. Cardiac Enzymes:  Recent Labs Lab 06/14/16 0939 06/14/16 1415 06/14/16 2030 06/15/16 0203 06/15/16 0624  TROPONINI 6.10* 5.62* 3.80* 2.85*  2.64*   BNP (last 3 results) No results for input(s): PROBNP in the last 8760 hours. HbA1C: No results for input(s): HGBA1C in the last 72 hours. CBG:  Recent Labs Lab 06/19/16 2341 06/20/16 0341 06/20/16 0806 06/20/16 1222 06/20/16 1553  GLUCAP 176* 206* 267* 193* 129*   Lipid Profile: No results for input(s): CHOL, HDL, LDLCALC, TRIG, CHOLHDL, LDLDIRECT in the last 72 hours. Thyroid Function Tests: No results for input(s): TSH, T4TOTAL, FREET4, T3FREE, THYROIDAB in the last 72 hours. Anemia Panel: No results for input(s): VITAMINB12, FOLATE, FERRITIN, TIBC, IRON, RETICCTPCT in the last 72 hours. Urine analysis:    Component Value Date/Time   COLORURINE AMBER* 06/13/2016 1615   COLORURINE Yellow 05/04/2014 0914   APPEARANCEUR TURBID* 06/13/2016 1615   APPEARANCEUR Clear 05/04/2014 0914   LABSPEC 1.021 06/13/2016 1615   LABSPEC 1.010 05/04/2014 0914   LABSPEC 1.020 02/14/2011 1654   PHURINE 5.0 06/13/2016 1615   PHURINE 5.0 05/04/2014 0914   PHURINE 6.0 02/14/2011 1654   GLUCOSEU 250* 06/13/2016 1615   GLUCOSEU >=500 05/04/2014 0914   HGBUR TRACE* 06/13/2016 1615   HGBUR 1+ 05/04/2014 0914   HGBUR Negative 02/14/2011 1654   HGBUR negative 05/17/2009 1334   BILIRUBINUR SMALL* 06/13/2016 1615   BILIRUBINUR Negative 05/04/2014 0914   BILIRUBINUR Negative 02/14/2011 1654   KETONESUR NEGATIVE 06/13/2016 1615   KETONESUR Negative 05/04/2014 0914   KETONESUR Negative 02/14/2011 1654   PROTEINUR NEGATIVE 06/13/2016 1615   PROTEINUR Negative 05/04/2014 0914   PROTEINUR Negative 02/14/2011 1654   UROBILINOGEN 0.2 09/17/2009 0233   NITRITE NEGATIVE 06/13/2016 1615   NITRITE Negative 05/04/2014 0914   NITRITE Negative 02/14/2011 1654   LEUKOCYTESUR LARGE* 06/13/2016 1615   LEUKOCYTESUR 2+ 05/04/2014 0914   LEUKOCYTESUR Trace 02/14/2011 1654   Sepsis Labs: @LABRCNTIP (procalcitonin:4,lacticidven:4)  ) Recent Results (from the past 240 hour(s))  Culture, blood  (routine x 2)     Status: None   Collection Time: 06/13/16  3:25 PM  Result Value Ref Range Status   Specimen Description BLOOD RIGHT ANTECUBITAL  Final   Special Requests BOTTLES DRAWN AEROBIC ONLY 5CC  Final   Culture NO GROWTH 5 DAYS  Final   Report Status 06/18/2016 FINAL  Final  Urine culture     Status: None   Collection Time: 06/13/16  4:15 PM  Result Value Ref Range Status   Specimen Description URINE, CATHETERIZED  Final   Special Requests Normal  Final   Culture NO GROWTH  Final   Report Status 06/14/2016 FINAL  Final  Culture, blood (routine x 2)     Status: None   Collection Time: 06/13/16  5:12 PM  Result Value Ref Range Status   Specimen Description BLOOD RIGHT EJ  Final   Special Requests BOTTLES DRAWN AEROBIC AND ANAEROBIC 5CC  Final   Culture NO GROWTH 5 DAYS  Final   Report Status 06/18/2016 FINAL  Final         Radiology Studies: Dg Chest Port 1 View  06/20/2016  CLINICAL DATA:  Smoker, Healthcare associated pneumonia EXAM: PORTABLE CHEST 1 VIEW COMPARISON:  06/17/2016 FINDINGS: Cardiomediastinal silhouette is stable. Again noted diffuse bilateral infiltrates right greater than left with slight improvement in aeration. NG  feeding tube coiled within stomach with tip not included in the film. Degenerative changes left shoulder. IMPRESSION: Again noted diffuse bilateral infiltrates right greater than left with slight improvement in aeration from prior exam. Electronically Signed   By: Lahoma Crocker M.D.   On: 06/20/2016 09:09        Scheduled Meds: . antiseptic oral rinse  7 mL Mouth Rinse q12n4p  . carvedilol  3.125 mg Oral BID WC  . cefTAZidime (FORTAZ)  IV  1 g Intravenous Q24H  . chlorhexidine  15 mL Mouth Rinse BID  . cholecalciferol  5,000 Units Oral Daily  . clopidogrel  75 mg Oral QHS  . fenofibrate  160 mg Oral QHS  . FLUoxetine  20 mg Oral Daily  . gabapentin  600 mg Oral TID  . Gerhardt's butt cream   Topical BID  . heparin subcutaneous  5,000  Units Subcutaneous Q8H  . insulin aspart  0-9 Units Subcutaneous Q4H  . insulin aspart  3 Units Subcutaneous Q4H  . insulin glargine  10 Units Subcutaneous Daily  . isosorbide mononitrate  60 mg Oral Daily  . pantoprazole sodium  40 mg Per Tube Daily  . simvastatin  20 mg Oral QHS  . topiramate  100 mg Oral BID  . vancomycin  750 mg Intravenous Q48H   Continuous Infusions: . dextrose    . feeding supplement (GLUCERNA 1.2 CAL) 1,000 mL (06/20/16 1814)     LOS: 7 days    Time spent: 38 Minutes    Dana Allan, MD  [facility] Pager #: [phone] 7PM-7AM contact night coverage as above

## 2016-06-20 NOTE — Progress Notes (Signed)
PULMONARY / CRITICAL CARE MEDICINE   Name: [name] MRN: [id number] DOB: Feb 19, 1951    ADMISSION DATE:  06/13/2016 CONSULTATION DATE:  06/13/2016  REFERRING MD:  Theotis Burrow, M.D. / EDP  CHIEF COMPLAINT:  Acute Encephalopathy  HISTORY OF PRESENT ILLNESS:   65 year old female with PMH as below, which includes DM, CAD, HTN, and recent admission for R femoral neck fracture s/p arthoplasty 6/8. She had suffered several fall prior to the fall that caused the fracture. Post operative course was fairly uncomplicated with the exception of some mild anemia requiring PRBC transfusion and UTI treated with levaquin. She was discharged to [facility] for rehabilitation on 6/12. 6/14 she returned to ER with chief complaint of AMS with associated hypotension. In the emergency department she was noted to be lethargic and confused. She was complaining of abdominal pain. She was hypotensive and hypothermic with elevated WBC. Glucose noted to be markedly elevated, however urine ketones negative. With metabolic acidosis. Lactic acid wnl. Due to persistent hypotension and concern for septic shock PCCM asked to admit.   SUBJECTIVE:  Pt alert and oriented x2 no acute distress.  Remains on HFNC with FiO2 at 35% with O2 sats mid to upper 90's.  VITAL SIGNS: BP 163/72 mmHg  Pulse 90  Temp(Src) 98.4 F (36.9 C) (Oral)  Resp 18  Ht 5\' 3"  (1.6 m)  Wt 97 lb 7.1 oz (44.2 kg)  BMI 17.27 kg/m2  SpO2 95%  HEMODYNAMICS:    VENTILATOR SETTINGS: Vent Mode:  [-]  FiO2 (%):  [35 %-40 %] 35 %  INTAKE / OUTPUT: I/O last 3 completed shifts: In: 2309.2 [NG/GT:1709.2; IV Piggyback:600] Out: [id number] [Urine:1355]  PHYSICAL EXAMINATION: General:  Chronically ill appearing female. On 35% Fio2 and comfortable.  Neuro:  Alert and oriented x2 follows commands, grossly nonfocal, CN grossly in tact. HEENT:  Dry mucus membranes. No scleral injection or icterus.  Cardiovascular:  Regular rate. Trace edema. Normal  S1 & S2. Lungs:  Coarse breath sounds bilaterally, even, non labored Abdomen:  Soft, non-tender, non-distended Musculoskeletal:  No acute deformity. Gr 1 edema  Skin:  Warm & dry. No rash on exposed skin.  LABS:  BMET  Recent Labs Lab 06/18/16 0020  06/19/16 0030 06/19/16 0501 06/20/16 0035  NA 137  --   --  140 145  K 2.8*  < > 2.8* 3.5 4.5  CL 100*  --   --  97* 102  CO2 25  --   --  34* 35*  BUN 55*  --   --  68* 80*  CREATININE 2.36*  --   --  2.67* 2.83*  GLUCOSE 168*  --   --  309* 209*  < > = values in this interval not displayed.  Electrolytes  Recent Labs Lab 06/18/16 0020  06/19/16 0501 06/19/16 0850 06/19/16 1515 06/20/16 0035  CALCIUM 9.2  --  9.3  --   --  9.7  MG 2.1  < > 2.2 2.0 2.2 2.1  PHOS 3.4  < > 2.2* 2.0* 1.6* 2.0*  < > = values in this interval not displayed.  CBC  Recent Labs Lab 06/17/16 0010 06/18/16 0020 06/20/16 0035  WBC 17.3* 13.9* 11.2*  HGB 11.1* 10.1* 10.1*  HCT 32.4* 29.8* 31.6*  PLT 196 225 242    Coag's No results for input(s): APTT, INR in the last 168 hours.  Sepsis Markers  Recent Labs Lab 06/13/16 1630 06/13/16 1933  06/14/16 0320 06/15/16 0203 06/20/16  0035  LATICACIDVEN 1.99 1.39  --   --   --   --   PROCALCITON  --   --   < > 2.90 2.18 8.76  < > = values in this interval not displayed.  ABG  Recent Labs Lab 06/15/16 1001 06/15/16 2328  PHART 7.363 7.320*  PCO2ART 25.5* 31.7*  PO2ART 45.0* 48.0*    Liver Enzymes  Recent Labs Lab 06/13/16 1618 06/14/16 0320  AST 44* 40  ALT 17 14  ALKPHOS 78 65  BILITOT 2.3* 0.8  ALBUMIN 2.0* 1.8*    Cardiac Enzymes  Recent Labs Lab 06/14/16 2030 06/15/16 0203 06/15/16 0624  TROPONINI 3.80* 2.85* 2.64*    Glucose  Recent Labs Lab 06/19/16 1138 06/19/16 1633 06/19/16 1958 06/19/16 2341 06/20/16 0341 06/20/16 0806  GLUCAP 335* 148* 98 176* 206* 267*    Imaging Dg Chest Port 1 View  06/20/2016  CLINICAL DATA:  Smoker, Healthcare  associated pneumonia EXAM: PORTABLE CHEST 1 VIEW COMPARISON:  06/17/2016 FINDINGS: Cardiomediastinal silhouette is stable. Again noted diffuse bilateral infiltrates right greater than left with slight improvement in aeration. NG feeding tube coiled within stomach with tip not included in the film. Degenerative changes left shoulder. IMPRESSION: Again noted diffuse bilateral infiltrates right greater than left with slight improvement in aeration from prior exam. Electronically Signed   By: Lahoma Crocker M.D.   On: 06/20/2016 09:09    STUDIES:  TTE (06/24/15): EF 55-60%. LA & RA normal in size. RV normal in size and function. No aortic stenosis or regurgitation. Mild mitral regurgitation. Mild tricuspid regurgitation. Port CXR 6/14: Bilateral hilar fullness and lower lobe opacification with silhouetting of the left hemidiaphragm. Port Abd X-ray 6/14:  No free air under diaphragm. Right hip hemiarthroplasty. Bowel gas pattern nonobstructive.  MICROBIOLOGY: MRSA PCR 6/14 >> (-) Blood Ctx x2 6/14 >> (-) 6/19 Urine Ctx 6/14 >> (-) 6/15  ANTIBIOTICS: Tressie Ellis 6/14 >> Vancomycin 6/14 >>  SIGNIFICANT EVENTS: 6/14 - Admit  LINES/TUBES: PIV x2 Cortrak>>  DISCUSSION:  65 year old female presenting with shock which is gradually resolving and diabetic ketoacidosis. Suspect underlying sepsis. Clinical status improving with correction of acidosis and hypotension. Elevated cardiac biomarkers suspicious for demand ischemia. Continuing broad-spectrum antibiotics while awaiting culture results. Had worsening resp status on 6/16 with pulm edema.   Also, pt with AKI on top of CKD with metabolic acidosis.   ASSESSMENT / PLAN:  PULMONARY A: Acute hypoxemic respiratory failure 2/2 pulm edema, possible L base HCAP  P:   Continue HFNC will wean as tolerated    Bipap prn and HS.  Pulmonary hygiene  Continuous Pulse Ox Supplemental O2  Titrate O2 for sat of 88-92%. Full code, if deteriorates will  intubate Intermittent CXR  CARDIOVASCULAR A:  Shock - Likely combination of hypovolemia & sepsis - imprioved. HTNsive now.  Acute Pulm edema Elevated Troponin I - Likely due to shock & acidosis. H/O Hyperlipidemia H/O CAD P:  Monitoring on telemetry. Vitals per unit protocol. Echo noted EF 65% and PAP of 48. On prn labetalol. Speech consult pending   Pt was on ISMN, coreg, lisinopril, norvasc, as an out pt. Once taking PO, will add BP meds.  Consider nitro drip also if bp remains elevated despite labetalol.  Lasix drip dc'd on 6/19 2/2 hypoK.   RENAL A:   Acute on Chronic Renal Failure - Baseline creatinine 1.9-2.0. CKD st IV P:   Monitor UOP Currently net negative Monitoring electrolytes & renal function daily Replacing electrolytes as indicated.  Son OK with HD if needed.   GASTROINTESTINAL A:   Abdominal Pain - Possible pancreatitis with elevated Lipase. Bilirubin elevated. H/O GERD P:   Cont TF via Cortrak for now Speech consult pending will start clear liquids today if cleared by speech  HEMATOLOGIC/ONCOLOGIC A:   Anemia - Near baseline anemia. No signs of active bleeding. Thrombocytopenia - Slightly above baseline due to hemoconcentration. H/O MGUS P:  Trending cell counts daily w/ CBC SCDs Heparin [place] q8hr  INFECTIOUS A:   Sepsis - Possible HCAP. Urine and blood (-)  P:   Empiric Vancomycin & Fortaz > if CXR is better, will deescalate abx.  Trending Procalcitonin per algorithm. PCT trending up 6/21 recheck in am  Urine Strep (-);  Legionella Antigen (-)   ENDOCRINE A:   DKA v HHS-- NO ketones, did initially have anion gap acidosis, now resolved.   DM   P:   CBG's trending down Cont scheduled novolog, lantus and SSI   NEUROLOGIC A:   Acute Encephalopathy - Likely metabolic. Improving. H/O Depression H/O TIA P:   Monitor closely in SDU Avoid benzos for now 2/2 on and off confusion and sedation.  (-) lateralizing signs.   FAMILY  -  Updates: Per patient's son, full code status.  Updated pts son and ex-husband on 6/21 about plan of care and questions answered.  Marda Stalker, [place]  Pulmonary/Critical Care  Attending Note:  65 year old female presenting with shock and encephalopathy due DKA that resolved.  On exam, lethargic but arousable and following commands and protecting her airway.  I reviewed CXR myself, pulmonary edema noted.  Discussed with PCCM-NP.  Acute pulmonary edema:  - KVO IVF.  - Lasix.  DKA:  - Continue insulin.  - Diabetic coordinator consult.  Deconditioning:  - PT evaluation.  - Diet.  AMS: due to DKA, resolved.  Hypoxemia:  - Titrate o2 for sat of 88-92%.  - Ambulatory desat.  PCCM will sign off, please call back if needed.  Patient seen and examined, agree with above note.  I dictated the care and orders written for this patient under my direction.  Rush Farmer, MD [phone]

## 2016-06-20 NOTE — Evaluation (Signed)
Clinical/Bedside Swallow Evaluation Patient Details  Name: [name] MRN: [id number] Date of Birth: 10-Oct-1951  Today's Date: 06/20/2016 Time: SLP Start Time (ACUTE ONLY): 1010 SLP Stop Time (ACUTE ONLY): 1025 SLP Time Calculation (min) (ACUTE ONLY): 15 min  Past Medical History:  Past Medical History  Diagnosis Date  . DIABETES MELLITUS, II, COMPLICATIONS [id number]  . HYPERLIPIDEMIA 02/27/2007  . 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 ANEMIA, OTHER, UNSPECIFIED 02/27/2007  . DEPRESSIVE DISORDER, NOS 02/27/2007  . HYPERTENSION, BENIGN SYSTEMIC 02/27/2007  . CORONARY, ARTERIOSCLEROSIS 02/27/2007  . TIA 05/17/2009  . GASTROESOPHAGEAL REFLUX, NO ESOPHAGITIS 02/27/2007  . RENAL INSUFFICIENCY, CHRONIC 12/28/2009  . Renal failure, acute on chronic (HCC) 01/25/2014  . Blood transfusion without reported diagnosis   . Anemia    Past Surgical History:  Past Surgical History  Procedure Laterality Date  . Partial hysterectomy    . Rectocele repair    . Bladder surgery      Tack  . Hip arthroplasty Right 06/07/2016    Procedure: ARTHROPLASTY BIPOLAR HIP (HEMIARTHROPLASTY);  Surgeon: Altamese Elko [place], MD;  Location: [place];  Service: Orthopedics;  Laterality: Right;   HPI:  65 year old female with PMH as below, which includes DM, CAD, HTN, and recent admission for R femoral neck fracture s/p arthoplasty 6/8. She had suffered several fall prior to the fall that caused the fracture. Post operative course was fairly uncomplicated with the exception of some mild anemia requiring PRBC transfusion and UTI treated with levaquin. She was discharged to [facility] for rehabilitation on 6/12. 6/14 she returned to ER with chief complaint of AMS with associated hypotension. In the emergency department she was noted to be lethargic and confused. She was complaining of abdominal pain. She was hypotensive and hypothermic with elevated WBC. Glucose noted to be markedly elevated, however urine ketones negative. With  metabolic acidosis. Lactic acid wnl. Has had a cortrak.    Assessment / Plan / Recommendation Clinical Impression  Pt demosntrates signs of an oropharyngeal dysphagia, likely due to deconditioning and weakness secondary to prolonged metabolic encephalopathy, possibly with some baseline component.  Pt is moderately lethargic during assessment, but able to follow commands and participate as needed. Trials of thin liquids were initially tolerated well with no signs of aspiration. When purees were given, pt observed to have decreased laryngeal elevation, multiple swallows and eventual coughing, which then persisted and worsened as further liquid trials were given. Provided moderate cues for increased breath suppoert and forceful cough with success.   Pt either has a low threshold for fatigue, or has specifially solid food dysphagia, possibly with pharyngeal residuals that increase risk ofa spriation. Pt should remain NPO except for ice chips, all meds through Cortrak. Pt would benefit from objective test to better characterize swallow function and make appropriate diet and therapy recommendation.  Will consider MBS tomrorow if arousal is appropriate and pt able to tolerate time off high flow oxygen.     Aspiration Risk  Severe aspiration risk    Diet Recommendation NPO;Ice chips PRN after oral care   Medication Administration: Via alternative means Compensations: Minimize environmental distractions;Slow rate;Small sips/bites Postural Changes: Seated upright at 90 degrees    Other  Recommendations Oral Care Recommendations: Oral care QID   Follow up Recommendations  Skilled Nursing facility    Frequency and Duration min 3x week  2 weeks       Prognosis Prognosis for Safe Diet Advancement: Good      Swallow Study   General HPI:  65 year old female with PMH as below, which includes DM, CAD, HTN, and recent admission for R femoral neck fracture s/p arthoplasty 6/8. She had suffered several fall  prior to the fall that caused the fracture. Post operative course was fairly uncomplicated with the exception of some mild anemia requiring PRBC transfusion and UTI treated with levaquin. She was discharged to [facility] for [facility] on 6/12. 6/14 she returned to ER with chief complaint of AMS with associated hypotension. In the emergency department she was noted to be lethargic and confused. She was complaining of abdominal pain. She was hypotensive and hypothermic with elevated WBC. Glucose noted to be markedly elevated, however urine ketones negative. With metabolic acidosis. Lactic acid wnl. Has had a cortrak.  Previous Swallow Assessment: BSE 06/08/16 - dys 3/thin, has a history of GERD.  Diet Prior to this Study: NPO;NG Tube Temperature Spikes Noted: No Respiratory Status: Nasal cannula (high flow) History of Recent Intubation: No Behavior/Cognition: Lethargic/Drowsy Oral Cavity Assessment: Within Functional Limits Oral Care Completed by SLP: No Oral Cavity - Dentition: Missing dentition Vision: Functional for self-feeding Self-Feeding Abilities: Needs assist Patient Positioning: Upright in bed Baseline Vocal Quality: Normal Volitional Cough: Weak Volitional Swallow: Able to elicit    Oral/Motor/Sensory Function Overall Oral Motor/Sensory Function: Within functional limits   Ice Chips Ice chips: Not tested   Thin Liquid Thin Liquid: Impaired Presentation: Cup;Straw;Self Fed Pharyngeal  Phase Impairments: Suspected delayed Swallow;Decreased hyoid-laryngeal movement;Multiple swallows;Cough - Delayed    Nectar Thick Nectar Thick Liquid: Not tested   Honey Thick Honey Thick Liquid: Not tested   Puree Puree: Impaired Pharyngeal Phase Impairments: Decreased hyoid-laryngeal movement;Multiple swallows;Cough - Immediate   Solid   GO   Solid: Not tested       Herbie Baltimore, MA CCC-SLP [phone]  Grae Leathers, Katherene Ponto 06/20/2016,10:37 AM

## 2016-06-20 NOTE — Progress Notes (Signed)
Confirmed with son, Daniel/HCPOA, that plan is for return to [facility] when pt is medically stable.  CSW will continue to follow  Domenica Reamer, [place] Social Worker [phone]

## 2016-06-20 NOTE — Progress Notes (Signed)
Pharmacy Antibiotic Note  [name] is a 65 y.o. female admitted on 06/13/2016 with sepsis.  Pharmacy has been consulted for Vancomycin + Ceftazidime dosing. AF, wbc down 11.2, SCr trend up 2.83, CrCl~13. PCT trend up.  Dose previously adjusted for high VT on 6/18.   Plan: Vancomycin 750 mg IV every 48 hours Ceftazidime to 1g IV every 24 hours Will continue to follow renal function, culture results, LOT - per MD, checking PCT in AM to guide de-esc plans  Height: 5\' 3"  (160 cm) Weight: 97 lb 7.1 oz (44.2 kg) IBW/kg (Calculated) : 52.4  Temp (24hrs), Avg:98.5 F (36.9 C), Min:97.9 F (36.6 C), Max:99.4 F (37.4 C)   Recent Labs Lab 06/13/16 1630 06/13/16 1933  06/14/16 0320  06/15/16 0203 06/16/16 0005 06/17/16 0010 06/17/16 1601 06/18/16 0020 06/19/16 0501 06/20/16 0035  WBC  --   --   --  8.6  --  8.6  --  17.3*  --  13.9*  --  11.2*  CREATININE  --   --   < > 3.06*  < > 2.91* 2.60* 2.54*  --  2.36* 2.67* 2.83*  LATICACIDVEN 1.99 1.39  --   --   --   --   --   --   --   --   --   --   VANCOTROUGH  --   --   --   --   --   --   --   --  26*  --   --   --   < > = values in this interval not displayed.  Estimated Creatinine Clearance: 13.6 mL/min (by C-G formula based on Cr of 2.83).    Allergies  Allergen Reactions  . Codeine Other (See Comments)    Noted as an allergy on MAR  . Hydralazine Hcl Swelling  . Bisphosphonates Rash  . Codeine Phosphate Nausea And Vomiting and Rash  . Penicillins Itching, Rash and Other (See Comments)    Has patient had a PCN reaction causing immediate rash, facial/tongue/throat swelling, SOB or lightheadedness with hypotension: No Has patient had a PCN reaction causing severe rash involving mucus membranes or skin necrosis: No Has patient had a PCN reaction that required hospitalization No Has patient had a PCN reaction occurring within the last 10 years: No If all of the above answers are "NO", then may proceed with Cephalosporin  use.  Pt tolerant to cephalosporins   . Sulfa Antibiotics Itching and Rash    Antimicrobials this admission: Vanc 6/15 >> Ceftazidime 6/14 >>  Dose adjustments this admission: * 6/18 VT 26 mcg/ml >> adjusted to 750 mg/48h  Microbiology results: 6/14 UCx >> NG 6/14 BCx >> ngf  Elicia Lamp, PharmD, [facility] Clinical Pharmacist Pager [phone] 06/20/2016 10:59 AM

## 2016-06-21 ENCOUNTER — Encounter (HOSPITAL_COMMUNITY): Payer: Self-pay | Admitting: Internal Medicine

## 2016-06-21 DIAGNOSIS — R6521 Severe sepsis with septic shock: Secondary | ICD-10-CM

## 2016-06-21 DIAGNOSIS — E1149 Type 2 diabetes mellitus with other diabetic neurological complication: Secondary | ICD-10-CM | POA: Diagnosis present

## 2016-06-21 DIAGNOSIS — A419 Sepsis, unspecified organism: Secondary | ICD-10-CM | POA: Diagnosis present

## 2016-06-21 DIAGNOSIS — E785 Hyperlipidemia, unspecified: Secondary | ICD-10-CM

## 2016-06-21 LAB — GLUCOSE, CAPILLARY
GLUCOSE-CAPILLARY: 261 mg/dL — AB (ref 65–99)
GLUCOSE-CAPILLARY: 209 mg/dL — AB (ref 65–99)
GLUCOSE-CAPILLARY: 265 mg/dL — AB (ref 65–99)
GLUCOSE-CAPILLARY: 204 mg/dL — AB (ref 65–99)
Glucose-Capillary: 251 mg/dL — ABNORMAL HIGH (ref 65–99)

## 2016-06-21 LAB — PROCALCITONIN
PROCALCITONIN: 6.64 ng/mL
Procalcitonin: 6.34 ng/mL

## 2016-06-21 LAB — MAGNESIUM
MAGNESIUM: 2.1 mg/dL (ref 1.7–2.4)
MAGNESIUM: 2.2 mg/dL (ref 1.7–2.4)
Magnesium: 2.3 mg/dL (ref 1.7–2.4)

## 2016-06-21 LAB — PHOSPHORUS
PHOSPHORUS: 1.6 mg/dL — AB (ref 2.5–4.6)
PHOSPHORUS: 1.7 mg/dL — AB (ref 2.5–4.6)
Phosphorus: 1.7 mg/dL — ABNORMAL LOW (ref 2.5–4.6)

## 2016-06-21 MED ORDER — GABAPENTIN 100 MG PO CAPS
200.0000 mg | ORAL_CAPSULE | Freq: Three times a day (TID) | ORAL | Status: DC
  Administered 2016-06-21 – 2016-07-04 (×39): 200 mg via ORAL
  Filled 2016-06-21 (×41): qty 2

## 2016-06-21 MED ORDER — LEVOFLOXACIN IN D5W 750 MG/150ML IV SOLN
750.0000 mg | Freq: Once | INTRAVENOUS | Status: AC
  Administered 2016-06-21: 750 mg via INTRAVENOUS
  Filled 2016-06-21: qty 150

## 2016-06-21 MED ORDER — INSULIN GLARGINE 100 UNIT/ML ~~LOC~~ SOLN: 12.0000 [IU] | Freq: Every day | SUBCUTANEOUS | Status: DC

## 2016-06-21 MED ORDER — LEVOFLOXACIN IN D5W 500 MG/100ML IV SOLN
500.0000 mg | INTRAVENOUS | Status: DC
  Administered 2016-06-23: 500 mg via INTRAVENOUS
  Filled 2016-06-21: qty 100

## 2016-06-21 MED ORDER — INSULIN ASPART 100 UNIT/ML ~~LOC~~ SOLN
0.0000 [IU] | SUBCUTANEOUS | Status: DC
  Administered 2016-06-21: 5 [IU] via SUBCUTANEOUS
  Administered 2016-06-21: 8 [IU] via SUBCUTANEOUS
  Administered 2016-06-22: 3 [IU] via SUBCUTANEOUS
  Administered 2016-06-22: 5 [IU] via SUBCUTANEOUS

## 2016-06-21 NOTE — Care Management Important Message (Signed)
Important Message  Patient Details  Name: [name] MRN: [id number] Date of Birth: 01-19-1951   Medicare Important Message Given:  Yes    Ruddy Swire, Kym Groom, RN 06/21/2016, 12:52 PM

## 2016-06-21 NOTE — Progress Notes (Signed)
Pharmacy Antibiotic Note  [name] is a 65 y.o. female admitted on 06/13/2016 with sepsis.  Pharmacy has been consulted to de-escalate from Vanc/Ceftaz to Levofloxacin. AF, wbc down 11.2, SCr trend up 2.83, CrCl~13. PCT trend up.  Last dose of Ceftaz was 6/20 at ~1640.  Plan: Levo 750mg  IV x 1; 500mg  IV q48h Monitor renal function, f/u cultures  Height: 5\' 3"  (160 cm) Weight: 99 lb 13.9 oz (45.3 kg) IBW/kg (Calculated) : 52.4  Temp (24hrs), Avg:99.2 F (37.3 C), Min:98.6 F (37 C), Max:100.2 F (37.9 C)   Recent Labs Lab 06/15/16 0203 06/16/16 0005 06/17/16 0010 06/17/16 1601 06/18/16 0020 06/19/16 0501 06/20/16 0035  WBC 8.6  --  17.3*  --  13.9*  --  11.2*  CREATININE 2.91* 2.60* 2.54*  --  2.36* 2.67* 2.83*  VANCOTROUGH  --   --   --  26*  --   --   --     Estimated Creatinine Clearance: 14 mL/min (by C-G formula based on Cr of 2.83).    Allergies  Allergen Reactions  . Codeine Other (See Comments)    Noted as an allergy on MAR  . Hydralazine Hcl Swelling  . Bisphosphonates Rash  . Codeine Phosphate Nausea And Vomiting and Rash  . Penicillins Itching, Rash and Other (See Comments)    Has patient had a PCN reaction causing immediate rash, facial/tongue/throat swelling, SOB or lightheadedness with hypotension: No Has patient had a PCN reaction causing severe rash involving mucus membranes or skin necrosis: No Has patient had a PCN reaction that required hospitalization No Has patient had a PCN reaction occurring within the last 10 years: No If all of the above answers are "NO", then may proceed with Cephalosporin use.  Pt tolerant to cephalosporins   . Sulfa Antibiotics Itching and Rash    Antimicrobials this admission: 6/14 vancomycin >> 6/22 6/14 ceftazidime >> 6/22 6/22 levo >>  Dose adjustments this admission: * 6/18 VT 26 mcg/ml >> adjusted to 750 mg/48h  Microbiology results: 6/14 UCx >> NG 6/14 BCx >> ngf  Elicia Lamp, PharmD,  [facility] Clinical Pharmacist Pager [phone] 06/21/2016 10:43 AM

## 2016-06-21 NOTE — Progress Notes (Signed)
Speech Language Pathology Treatment: Dysphagia  Patient Details Name: [name] MRN: [id number] DOB: November 16, 1951 Today's Date: 06/21/2016 Time: [id number] SLP Time Calculation (min) (ACUTE ONLY): 26 min  Assessment / Plan / Recommendation Clinical Impression  Diagnostic treatment focused on po readiness. Patient more alert today, sitting upright in chair, communicative but with weak, low intensity phonation. Po trials provided. Patient presents with what appears to be an intact oropharyngeal swallow without overt indication of aspiration, independently utilizes chin tuck intermittently (strategy provided by 2010 MBS). Oral phase prolonged with regular texture solids due to combination of missing dentition and fatigue. Although high O2 requirements do increase aspiration risk, patient appears appropriate to resume a po diet today. Recommend dysphagia 1 solids for energy conservation. SLP will f/u closely for tolerance and potential to advance with improved strength and respiratory function. Patient may continue use of chin tuck however does not appear to be significantly impacting function at this time.    HPI HPI: 65 year old female with PMH as below, which includes DM, CAD, HTN, and recent admission for R femoral neck fracture s/p arthoplasty 6/8. She had suffered several fall prior to the fall that caused the fracture. Post operative course was fairly uncomplicated with the exception of some mild anemia requiring PRBC transfusion and UTI treated with levaquin. She was discharged to [facility] for rehabilitation on 6/12. 6/14 she returned to ER with chief complaint of AMS with associated hypotension. In the emergency department she was noted to be lethargic and confused. She was complaining of abdominal pain. She was hypotensive and hypothermic with elevated WBC. Glucose noted to be markedly elevated, however urine ketones negative. With metabolic acidosis. Lactic acid wnl. Has had a  cortrak.       SLP Plan  Goals updated     Recommendations  Diet recommendations: Dysphagia 1 (puree);Thin liquid Liquids provided via: Cup;Straw Medication Administration: Crushed with puree Supervision: Patient able to self feed;Intermittent supervision to cue for compensatory strategies Compensations: Slow rate;Small sips/bites;Other (Comment);Chin tuck (frequent breaks to control SOB/fatigue) Postural Changes and/or Swallow Maneuvers: Seated upright 90 degrees;Chin tuck             Oral Care Recommendations: Oral care BID Follow up Recommendations: Skilled Nursing facility Plan: Goals updated     Sumner Bonneau, [place] [phone]    [place] 06/21/2016, 11:27 AM

## 2016-06-21 NOTE — Progress Notes (Signed)
Physical Therapy Treatment Patient Details Name: [name] MRN: [id number] DOB: 02/23/1951 Today's Date: 06/21/2016    History of Present Illness Pt readmitted from SNF with sepsis and respiratory failure. Pt placed on bipap. Pt with recent rt hip fx and hemiarthroplasty on 06/08/16. PMH - DM, CAD, CKD, falls, HTN    PT Comments    Patient alert and able to fully participate (despite some confusion). Unable to attempt walking due to severe ?asterixis x 4 extremities (jerking with muscles intermittently lapsing, resulting in knee buckling and inconsistent ability to support herself on the RW with her arms). Patient unable to reliably report if this is new for her.   Follow Up Recommendations  SNF     Equipment Recommendations  None recommended by PT    Recommendations for Other Services       Precautions / Restrictions Precautions Precautions: Fall;Posterior Hip Precaution Comments: Patient unable to state hip precautions and reviewed verbally and with visual demonstration. Required assist to prevent Adduction and Internal rotation in sitting Restrictions Weight Bearing Restrictions: No RLE Weight Bearing: Weight bearing as tolerated    Mobility  Bed Mobility Overal bed mobility: Needs Assistance Bed Mobility: Supine to Sit     Supine to sit: Max assist;+2 for safety/equipment;HOB elevated     General bed mobility comments: Patient assisting with all aspects, HOB elevated  Transfers Overall transfer level: Needs assistance Equipment used: None Transfers: Sit to/from [facility] Sit to Stand: Mod assist;+2 safety/equipment Stand pivot transfers: Mod assist;+2 safety/equipment       General transfer comment: verbal cues for sequencing and precautions; assist for power up due to weakness; Pt experiencing ?asterixis with jerks/brief periods of collapse therefore walker not used and Lt knee blocked  Ambulation/Gait             General Gait  Details: Unable   Financial trader Rankin (Stroke Patients Only)       Balance Overall balance assessment: History of Falls   Sitting balance-Leahy Scale: Poor     Standing balance support: Bilateral upper extremity supported Standing balance-Leahy Scale: Zero                      Cognition Arousal/Alertness: Awake/alert Behavior During Therapy: Anxious Overall Cognitive Status: No family/caregiver present to determine baseline cognitive functioning Area of Impairment: Orientation;Memory Orientation Level: Place;Situation;Time Current Attention Level: Sustained Memory: Decreased recall of precautions;Decreased short-term memory Following Commands: Follows one step commands with increased time            Exercises Total Joint Exercises Ankle Circles/Pumps: AROM;Both;10 reps Quad Sets: AROM;Both;5 reps Heel Slides: AROM;Both;5 reps Hip ABduction/ADduction: AAROM;Right;5 reps    General Comments General comments (skin integrity, edema, etc.): pt reports jerks/tremors present prior to hip fracture ?accuracy      Pertinent Vitals/Pain Pain Assessment: Faces Faces Pain Scale: Hurts a little bit Pain Location: rt hip with ROM Pain Descriptors / Indicators: Discomfort Pain Intervention(s): Limited activity within patient's tolerance;Monitored during session;Repositioned    Home Living                      Prior Function            PT Goals (current goals can now be found in the care plan section) Acute Rehab PT Goals Patient Stated Goal: get stronger and walk Time For Goal Achievement: 07/02/16 Progress towards  PT goals: Progressing toward goals    Frequency  Min 2X/week    PT Plan Current plan remains appropriate    Co-evaluation             End of Session Equipment Utilized During Treatment: Oxygen Activity Tolerance: Patient limited by fatigue Patient left: with call bell/phone within  reach;in chair;with chair alarm set     Time: [id number] PT Time Calculation (min) (ACUTE ONLY): 36 min  Charges:  $Therapeutic Exercise: 8-22 mins $Therapeutic Activity: 8-22 mins                    G Codes:      Levaughn Puccinelli 2016-06-23, 10:53 AM  Pager [phone]

## 2016-06-21 NOTE — Progress Notes (Signed)
Progress Note    [name]  [id number] DOB: [birth year]  DOA: 06/13/2016 PCP: [name], MD    Brief Narrative:   [name] is an 65 y.o. female with PMH significant for DM, CAD, HTN, and recent admission for fall resulting in a right femoral neck fracture s/p arthoplasty 06/07/16. Post operative course was fairly uncomplicated with the exception of some mild anemia requiring PRBC transfusion and UTI treated with levaquin. She was discharged to [facility] for rehabilitation on 06/11/16. She was readmitted 06/13/16 with altered mental status and hypotension concerning for sepsis. Lactic acid was WNL. Treated for DKA which was also present on admission.  Assessment/Plan:   Principal Problems:   Acute respiratory failure with hypoxemia (HCC) secondary to pulmonary edema with possible left base HCAP Continue supplemental oxygen and BiPAP as needed/at bedtime. Continue pulmonary hygiene. Continue oxygen to maintain oxygen saturations 88-92 percent.    Shock secondary to hypovolemia and probable sepsis Blood and urine cultures negative. Trending Procalcitonin per algorithm. PCT trending up 06/20/16. Urine Strep (-); Legionella Antigen (-) Will narrow antibiotics to Levaquin given that she has completed 8 days of therapy with Fortaz and vancomycin. Repeat chest x-ray and Pro calcitonin in the morning.  Active Problems:   CAD (coronary artery disease)/demand ischemia Continue Coreg, labetalol, Imdur, fenofibrate, Zocor and Plavix.    Diabetic ketoacidosis without coma associated with type 2 diabetes mellitus (HCC)/metabolic acidosis Resolved. Diabetes currently being managed with insulin sensitive SSI +3 units every 4 hours as well as 10 units of Lantus. CBGs 129-261. Increase Lantus to 12 units and change SSI to moderate scale.    AKI (acute kidney injury) (HCC)/stage III chronic kidney disease Baseline creatinine 1.9-2.0. Current creatinine remains elevated over  usual baseline values.    Hypertension Continue Coreg and labetalol.    Hyperlipidemia Continue fenofibrate and Zocor.    Possible pancreatitis/elevated lipase Currently receiving tube feedings.    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  Monitor counts. No current indication for transfusion. Platelets normalized.    Acute encephalopathy Remains encephalopathic and weak appearing. Reduce Neurontin to 200 mg 3 times a day given impaired renal function and ongoing encephalopathy.  Family Communication/Anticipated D/C date and plan/Code Status   DVT prophylaxis: Heparin ordered. Code Status: Full Code.  Family Communication: No family at bedside. Son, Quillian Quince, called at [phone], no answer. Message left. Disposition Plan: Lives alone. Will likely need SNF.   Medical Consultants:    Pulmonology   Procedures:   TTE (06/24/15): EF 55-60%. LA & RA normal in size. RV normal in size and function. No aortic stenosis or regurgitation. Mild mitral regurgitation. Mild tricuspid regurgitation. Port CXR 6/14: Bilateral hilar fullness and lower lobe opacification with silhouetting of the left hemidiaphragm. Port Abd X-ray 6/14: No free air under diaphragm. Right hip hemiarthroplasty. Bowel gas pattern nonobstructive.  Anti-Infectives:   Tressie Ellis 6/14 >>6/22 Vancomycin 6/14 >> 6/22 Levaquin 6/22 >>   Subjective:    [name] is sitting up in the chair, very weak. Attempting to take small bites of pured foods, but complains that the food is cold. No choking observed with attempts to swallow. Ate about 25% of her meal.  Objective:    Filed Vitals:   06/21/16 0000 06/21/16 0310 06/21/16 0400 06/21/16 0454  BP: 129/67 127/57 127/57   Pulse: 91 83 81   Temp:   98.8 F (37.1 C)   TempSrc:   Axillary  Resp: 20 18 17    Height:      Weight:    45.3 kg (99 lb 13.9 oz)  SpO2: 95% 98% 99%     Intake/Output Summary (Last 24  hours) at 06/21/16 0714 Last data filed at 06/21/16 0000  Gross per 24 hour  Intake   1235 ml  Output    850 ml  Net    385 ml   Filed Weights   06/19/16 0400 06/20/16 0500 06/21/16 0454  Weight: 48.6 kg (107 lb 2.3 oz) 44.2 kg (97 lb 7.1 oz) 45.3 kg (99 lb 13.9 oz)    Exam: General exam: Very weak, sitting up in chair with a Panda tube in her right naris. Respiratory system: Clear to auscultation, diminished in the bases. Respiratory effort normal. Cardiovascular system: S1 & S2 heard, RRR. No JVD,  rubs, gallops or clicks. No murmurs. Gastrointestinal system: Abdomen is nondistended, soft and nontender. No organomegaly or masses felt. Normal bowel sounds heard. Central nervous system: Sleepy appearing, oriented x2. No focal neurological deficits. Extremities: Trace edema. Skin: No rashes, lesions or ulcers Psychiatry: Judgement and insight appear normal. Mood & affect flat.   Data Reviewed:   I have personally reviewed following labs and imaging studies:  Labs: Basic Metabolic Panel:  Recent Labs Lab 06/16/16 0005  06/17/16 0010  06/18/16 0020  06/19/16 0501  06/19/16 1515 06/20/16 0035 06/20/16 0814 06/20/16 1551 06/21/16 0025  NA 140  --  138  --  137  --  140  --   --  145  --   --   --   K 3.2*  --  2.9*  < > 2.8*  < > 3.5  --   --  4.5  --   --   --   CL 113*  --  107  --  100*  --  97*  --   --  102  --   --   --   CO2 19*  --  18*  --  25  --  34*  --   --  35*  --   --   --   GLUCOSE 210*  --  204*  --  168*  --  309*  --   --  209*  --   --   --   BUN 55*  --  56*  --  55*  --  68*  --   --  80*  --   --   --   CREATININE 2.60*  --  2.54*  --  2.36*  --  2.67*  --   --  2.83*  --   --   --   CALCIUM 8.3*  --  8.8*  --  9.2  --  9.3  --   --  9.7  --   --   --   MG 1.6*  < > 2.5*  < > 2.1  < > 2.2  < > 2.2 2.1 2.1 2.1 2.2  PHOS 3.5  < > 4.3  < > 3.4  < > 2.2*  < > 1.6* 2.0* 2.1* 1.7* 1.6*  < > = values in this interval not displayed. GFR Estimated  Creatinine Clearance: 14 mL/min (by C-G formula based on Cr of 2.83). Liver Function Tests: No results for input(s): AST, ALT, ALKPHOS, BILITOT, PROT, ALBUMIN in the last 168 hours. No results for input(s): LIPASE, AMYLASE in the last 168 hours. No results for input(s): AMMONIA in the last  168 hours. Coagulation profile No results for input(s): INR, PROTIME in the last 168 hours.  CBC:  Recent Labs Lab 06/15/16 0203 06/17/16 0010 06/18/16 0020 06/20/16 0035  WBC 8.6 17.3* 13.9* 11.2*  HGB 10.2* 11.1* 10.1* 10.1*  HCT 30.4* 32.4* 29.8* 31.6*  MCV 87.6 86.6 88.4 91.3  PLT 176 196 225 242   Cardiac Enzymes:  Recent Labs Lab 06/14/16 0939 06/14/16 1415 06/14/16 2030 06/15/16 0203 06/15/16 0624  TROPONINI 6.10* 5.62* 3.80* 2.85* 2.64*   BNP (last 3 results) No results for input(s): PROBNP in the last 8760 hours. CBG:  Recent Labs Lab 06/20/16 1222 06/20/16 1553 06/20/16 2001 06/20/16 2322 06/21/16 0533  GLUCAP 193* 129* 178* 126* 261*   Sepsis Labs:  Recent Labs Lab 06/15/16 0203 06/17/16 0010 06/18/16 0020 06/20/16 0035  PROCALCITON 2.18  --   --  8.76  WBC 8.6 17.3* 13.9* 11.2*   Microbiology Recent Results (from the past 240 hour(s))  Culture, blood (routine x 2)     Status: None   Collection Time: 06/13/16  3:25 PM  Result Value Ref Range Status   Specimen Description BLOOD RIGHT ANTECUBITAL  Final   Special Requests BOTTLES DRAWN AEROBIC ONLY 5CC  Final   Culture NO GROWTH 5 DAYS  Final   Report Status 06/18/2016 FINAL  Final  Urine culture     Status: None   Collection Time: 06/13/16  4:15 PM  Result Value Ref Range Status   Specimen Description URINE, CATHETERIZED  Final   Special Requests Normal  Final   Culture NO GROWTH  Final   Report Status 06/14/2016 FINAL  Final  Culture, blood (routine x 2)     Status: None   Collection Time: 06/13/16  5:12 PM  Result Value Ref Range Status   Specimen Description BLOOD RIGHT EJ  Final   Special  Requests BOTTLES DRAWN AEROBIC AND ANAEROBIC 5CC  Final   Culture NO GROWTH 5 DAYS  Final   Report Status 06/18/2016 FINAL  Final    Radiology: Dg Chest Port 1 View  06/20/2016  CLINICAL DATA:  Smoker, Healthcare associated pneumonia EXAM: PORTABLE CHEST 1 VIEW COMPARISON:  06/17/2016 FINDINGS: Cardiomediastinal silhouette is stable. Again noted diffuse bilateral infiltrates right greater than left with slight improvement in aeration. NG feeding tube coiled within stomach with tip not included in the film. Degenerative changes left shoulder. IMPRESSION: Again noted diffuse bilateral infiltrates right greater than left with slight improvement in aeration from prior exam. Electronically Signed   By: Lahoma Crocker M.D.   On: 06/20/2016 09:09    Medications:   . antiseptic oral rinse  7 mL Mouth Rinse q12n4p  . carvedilol  3.125 mg Oral BID WC  . cefTAZidime (FORTAZ)  IV  1 g Intravenous Q24H  . chlorhexidine  15 mL Mouth Rinse BID  . cholecalciferol  5,000 Units Oral Daily  . clopidogrel  75 mg Oral QHS  . fenofibrate  160 mg Oral QHS  . FLUoxetine  20 mg Oral Daily  . gabapentin  600 mg Oral TID  . Gerhardt's butt cream   Topical BID  . heparin subcutaneous  5,000 Units Subcutaneous Q8H  . insulin aspart  0-9 Units Subcutaneous Q4H  . insulin aspart  3 Units Subcutaneous Q4H  . insulin glargine  10 Units Subcutaneous Daily  . isosorbide mononitrate  60 mg Oral Daily  . pantoprazole sodium  40 mg Per Tube Daily  . simvastatin  20 mg Oral QHS  . topiramate  100 mg Oral BID  . vancomycin  750 mg Intravenous Q48H   Continuous Infusions: . dextrose    . feeding supplement (GLUCERNA 1.2 CAL) 1,000 mL (06/20/16 1814)    Time spent: 35 minutes.  The patient is medically complex with multiple co-morbidities and is at high risk for clinical deterioration and requires high complexity decision making.    LOS: 8 days   [place] [facility] Pager [phone]. If unable to reach me  by pager, please call my cell phone at [phone].  *Please refer to [email or web address], password [email or web address] to get updated schedule on who will round on this patient, as hospitalists switch teams weekly. If 7PM-7AM, please contact night-coverage at [email or web address], password [email or web address] for any overnight needs.  06/21/2016, 7:14 AM

## 2016-06-22 ENCOUNTER — Inpatient Hospital Stay (HOSPITAL_COMMUNITY): Payer: Commercial Managed Care - HMO

## 2016-06-22 DIAGNOSIS — D472 Monoclonal gammopathy: Secondary | ICD-10-CM

## 2016-06-22 LAB — MAGNESIUM
MAGNESIUM: 2.1 mg/dL (ref 1.7–2.4)
Magnesium: 2 mg/dL (ref 1.7–2.4)
Magnesium: 2.1 mg/dL (ref 1.7–2.4)

## 2016-06-22 LAB — BASIC METABOLIC PANEL
ANION GAP: 5 (ref 5–15)
BUN: 102 mg/dL — ABNORMAL HIGH (ref 6–20)
CHLORIDE: 97 mmol/L — AB (ref 101–111)
CO2: 39 mmol/L — AB (ref 22–32)
Calcium: 9 mg/dL (ref 8.9–10.3)
Creatinine, Ser: 2.94 mg/dL — ABNORMAL HIGH (ref 0.44–1.00)
GFR calc non Af Amer: 16 mL/min — ABNORMAL LOW (ref >60)
GFR, EST AFRICAN AMERICAN: 18 mL/min — AB (ref >60)
Glucose, Bld: 176 mg/dL — ABNORMAL HIGH (ref 65–99)
Potassium: 4.6 mmol/L (ref 3.5–5.1)
Sodium: 141 mmol/L (ref 135–145)

## 2016-06-22 LAB — PHOSPHORUS
PHOSPHORUS: 3 mg/dL (ref 2.5–4.6)
Phosphorus: 1.9 mg/dL — ABNORMAL LOW (ref 2.5–4.6)
Phosphorus: 2.3 mg/dL — ABNORMAL LOW (ref 2.5–4.6)

## 2016-06-22 LAB — GLUCOSE, CAPILLARY
GLUCOSE-CAPILLARY: 223 mg/dL — AB (ref 65–99)
GLUCOSE-CAPILLARY: 194 mg/dL — AB (ref 65–99)
Glucose-Capillary: 151 mg/dL — ABNORMAL HIGH (ref 65–99)
Glucose-Capillary: 406 mg/dL — ABNORMAL HIGH (ref 65–99)
Glucose-Capillary: 315 mg/dL — ABNORMAL HIGH (ref 65–99)
Glucose-Capillary: 199 mg/dL — ABNORMAL HIGH (ref 65–99)

## 2016-06-22 LAB — CBC
HCT: 31.7 % — ABNORMAL LOW (ref 36.0–46.0)
HEMOGLOBIN: 9.6 g/dL — AB (ref 12.0–15.0)
MCH: 29.1 pg (ref 26.0–34.0)
MCHC: 30.3 g/dL (ref 30.0–36.0)
MCV: 96.1 fL (ref 78.0–100.0)
Platelets: 281 10*3/uL (ref 150–400)
RBC: 3.3 MIL/uL — AB (ref 3.87–5.11)
RDW: 18.4 % — ABNORMAL HIGH (ref 11.5–15.5)
WBC: 8.5 10*3/uL (ref 4.0–10.5)

## 2016-06-22 LAB — PROCALCITONIN: PROCALCITONIN: 5.47 ng/mL

## 2016-06-22 MED ORDER — K PHOS MONO-SOD PHOS DI & MONO 155-852-130 MG PO TABS
250.0000 mg | ORAL_TABLET | Freq: Three times a day (TID) | ORAL | Status: AC
  Administered 2016-06-22 (×3): 250 mg via ORAL
  Filled 2016-06-22 (×3): qty 1

## 2016-06-22 MED ORDER — INSULIN ASPART 100 UNIT/ML ~~LOC~~ SOLN
0.0000 [IU] | Freq: Three times a day (TID) | SUBCUTANEOUS | Status: DC
  Administered 2016-06-22: 11 [IU] via SUBCUTANEOUS
  Administered 2016-06-22: 15 [IU] via SUBCUTANEOUS

## 2016-06-22 MED ORDER — INSULIN ASPART 100 UNIT/ML ~~LOC~~ SOLN: 0.0000 [IU] | Freq: Every day | SUBCUTANEOUS | Status: DC

## 2016-06-22 MED ORDER — CARBAMIDE PEROXIDE 6.5 % OT SOLN
5.0000 [drp] | Freq: Two times a day (BID) | OTIC | Status: DC
  Administered 2016-06-22 – 2016-07-04 (×24): 5 [drp] via OTIC
  Filled 2016-06-22: qty 15

## 2016-06-22 MED ORDER — LORAZEPAM 0.5 MG PO TABS
0.5000 mg | ORAL_TABLET | Freq: Three times a day (TID) | ORAL | Status: DC | PRN
Start: 2016-06-22 — End: 2016-07-04
  Administered 2016-06-23 – 2016-07-03 (×7): 0.5 mg via ORAL
  Filled 2016-06-22 (×7): qty 1

## 2016-06-22 MED ORDER — INSULIN ASPART 100 UNIT/ML ~~LOC~~ SOLN
4.0000 [IU] | Freq: Three times a day (TID) | SUBCUTANEOUS | Status: DC
  Administered 2016-06-22 (×2): 4 [IU] via SUBCUTANEOUS

## 2016-06-22 NOTE — Progress Notes (Signed)
Results for ZOIE, SIMINGTON (MRN [id number]) as of 06/22/2016 09:34  Ref. Range 06/21/2016 16:42 06/21/2016 19:56 06/22/2016 00:06 06/22/2016 04:37 06/22/2016 07:57  Glucose-Capillary Latest Ref Range: 65-99 mg/dL 251 (H) 204 (H) 151 (H) 223 (H) 194 (H)  Noted that patient is on continuous tube feedings. Recommend changing Novolog MODERATE correction scale to every 4 hours if on continuous tube feedings. Change meal coverage Novolog 4 units to every 4 hours. Will continue to monitor blood sugars while in the hospital. Harvel Ricks RN BSN CDE

## 2016-06-22 NOTE — Progress Notes (Signed)
Speech Language Pathology Treatment: Dysphagia  Patient Details Name: [name] MRN: [id number] DOB: 06/28/1951 Today's Date: 06/22/2016 Time: [id number] SLP Time Calculation (min) (ACUTE ONLY): 27 min  Assessment / Plan / Recommendation Clinical Impression  Pt alert, sitting in recliner, improved respiratory status since yesterday.  Continues with TF.  Tolerating POs well  despite fatigue with no overt s/s of aspiration, min-mod I cues to take frequent rest breaks.  No difficulty with purees, thin liquids, soft solids.  Extremely high pitched phonation; normal volume.  Pt anxious, worried that her son is making decisions for her without her consent.    Recommend upgrading diet to dysphagia 2 (purees are not appetizing to her); thin liquids; continue crushing meds for now.  Chin tuck no longer warranted.  Consider D/Cing TF.   HPI HPI: 65 year old female with PMH as below, which includes DM, CAD, HTN, and recent admission for R femoral neck fracture s/p arthoplasty 6/8. She had suffered several fall prior to the fall that caused the fracture. Post operative course was fairly uncomplicated with the exception of some mild anemia requiring PRBC transfusion and UTI treated with levaquin. She was discharged to [facility] for rehabilitation on 6/12. 6/14 she returned to ER with chief complaint of AMS with associated hypotension. In the emergency department she was noted to be lethargic and confused. She was complaining of abdominal pain. She was hypotensive and hypothermic with elevated WBC. Glucose noted to be markedly elevated, however urine ketones negative. With metabolic acidosis. Lactic acid wnl. Has had a cortrak.       SLP Plan       Recommendations  Diet recommendations: Dysphagia 2 (fine chop);Thin liquid Liquids provided via: Cup;Straw Medication Administration: Crushed with puree Supervision: Patient able to self feed Compensations: Slow rate;Small sips/bites (frequent  breaks) Postural Changes and/or Swallow Maneuvers: Seated upright 90 degrees             Oral Care Recommendations: Oral care BID Follow up Recommendations: Skilled Nursing facility     [place]. Tivis Ringer, [place] CCC/SLP Pager [phone]  Juan Quam Laurice 06/22/2016, 2:21 PM

## 2016-06-22 NOTE — Progress Notes (Addendum)
Progress Note    [name]  [id number] DOB: [birth year]  DOA: 06/13/2016 PCP: [name], MD    Brief Narrative:   [name] is an 65 y.o. female with PMH significant for DM, CAD, HTN, and recent admission for fall resulting in a right femoral neck fracture s/p arthoplasty 06/07/16. Post operative course was fairly uncomplicated with the exception of some mild anemia requiring PRBC transfusion and UTI treated with levaquin. She was discharged to [facility] for rehabilitation on 06/11/16. She was readmitted 06/13/16 with altered mental status and hypotension concerning for sepsis. Lactic acid was WNL. Treated for DKA which was also present on admission.  Assessment/Plan:   Principal Problems:   Acute respiratory failure with hypoxemia (HCC) secondary to pulmonary edema with possible left base HCAP Continue supplemental oxygen and BiPAP as needed/at bedtime. Continue pulmonary hygiene. Continue oxygen to maintain oxygen saturations 88-92 percent.    Shock secondary to hypovolemia and probable sepsis Blood and urine cultures negative. Trending Procalcitonin per algorithm. PCT trending up 06/20/16. Urine Strep (-); Legionella Antigen (-) Antibiotics narrowed to Levaquin status post 8 days of therapy with Fortaz and vancomycin. Pro calcitonin beginning to trend down. Repeat chest x-ray showed an interval decrease in bilateral diffuse airspace disease.  Active Problems:   CAD (coronary artery disease)/demand ischemia Continue Coreg, labetalol, Imdur, fenofibrate, Zocor and Plavix.    Diabetic ketoacidosis without coma associated with type 2 diabetes mellitus (HCC)/metabolic acidosis Resolved. Diabetes currently being managed with moderate scale and 12 units of Lantus. CBGs 151-265. Will add meal coverage and change to every before meals/at bedtime coverage. Discontinue tube feeds now but the patient is eating.    AKI (acute kidney injury) (HCC)/stage III chronic  kidney disease Baseline creatinine 1.9-2.0. Current creatinine remains elevated over usual baseline values.    Hypertension Continue Coreg and labetalol.    Hyperlipidemia Continue fenofibrate and Zocor.    Possible pancreatitis/elevated lipase Currently receiving tube feedings. Diet has now been advanced. Discontinue tube feedings given feelings of fullness/satiety.    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  Monitor counts. No current indication for transfusion. Platelets normalized.    Acute encephalopathy Remains encephalopathic and weak appearing. Reduce Neurontin to 200 mg 3 times a day given impaired renal function and ongoing encephalopathy.    Hypophosphatemia Replete.  Family Communication/Anticipated D/C date and plan/Code Status   DVT prophylaxis: Heparin ordered. Code Status: Full Code.  Family Communication: Ardis Hughs, called at [phone], updated by telephone. Disposition Plan: Lives alone. Will likely need SNF.   Medical Consultants:    Pulmonology   Procedures:   TTE (06/24/15): EF 55-60%. LA & RA normal in size. RV normal in size and function. No aortic stenosis or regurgitation. Mild mitral regurgitation. Mild tricuspid regurgitation. Port CXR 6/14: Bilateral hilar fullness and lower lobe opacification with silhouetting of the left hemidiaphragm. Port Abd X-ray 6/14: No free air under diaphragm. Right hip hemiarthroplasty. Bowel gas pattern nonobstructive.  Anti-Infectives:   Tressie Ellis 6/14 >>6/22 Vancomycin 6/14 >> 6/22 Levaquin 6/22 >>   Subjective:   [name] is sitting up in the chair, very weak. She has multiple somatic complaints today including nausea, fullness, right ear pain, knee pain, chest pain, shortness of breath.  Objective:    Filed Vitals:   06/22/16 0500 06/22/16 0502 06/22/16 0800 06/22/16 0801  BP:  154/68 162/65   Pulse:  91 82 86  Temp:  99 F (  37.2 C) 98.8 F  (37.1 C)   TempSrc:  Oral Oral   Resp:  14 19 16   Height:      Weight: 45.5 kg (100 lb 5 oz)     SpO2:  100% 96% 97%    Intake/Output Summary (Last 24 hours) at 06/22/16 1227 Last data filed at 06/22/16 0500  Gross per 24 hour  Intake    935 ml  Output    450 ml  Net    485 ml   Filed Weights   06/20/16 0500 06/21/16 0454 06/22/16 0500  Weight: 44.2 kg (97 lb 7.1 oz) 45.3 kg (99 lb 13.9 oz) 45.5 kg (100 lb 5 oz)    Exam: General exam: Very weak, sitting up in chair. HEENT: Right ear with cerumen, no other abnormalities.  Panda tube in her right naris. Respiratory system: Clear to auscultation, diminished in the bases. Respiratory effort normal. Cardiovascular system: S1 & S2 heard, RRR. No JVD, rubs, gallops or clicks. No murmurs. Gastrointestinal system: Abdomen is nondistended, soft and nontender. No organomegaly or masses felt. Normal bowel sounds heard. Central nervous system: Awake, oriented x2. No focal neurological deficits. Extremities: No significant edema. Skin: No rashes, lesions or ulcers Psychiatry: Judgement and insight appear normal. Mood & affect anxious.   Data Reviewed:   I have personally reviewed following labs and imaging studies:  Labs: Basic Metabolic Panel:  Recent Labs Lab 06/17/16 0010  06/18/16 0020  06/19/16 0501  06/20/16 0035  06/21/16 0025 06/21/16 1121 06/21/16 1526 06/22/16 0002 06/22/16 0312 06/22/16 0736  NA 138  --  137  --  140  --  145  --   --   --   --   --  141  --   K 2.9*  < > 2.8*  < > 3.5  --  4.5  --   --   --   --   --  4.6  --   CL 107  --  100*  --  97*  --  102  --   --   --   --   --  97*  --   CO2 18*  --  25  --  34*  --  35*  --   --   --   --   --  39*  --   GLUCOSE 204*  --  168*  --  309*  --  209*  --   --   --   --   --  176*  --   BUN 56*  --  55*  --  68*  --  80*  --   --   --   --   --  102*  --   CREATININE 2.54*  --  2.36*  --  2.67*  --  2.83*  --   --   --   --   --  2.94*  --   CALCIUM 8.8*   --  9.2  --  9.3  --  9.7  --   --   --   --   --  9.0  --   MG 2.5*  < > 2.1  < > 2.2  < > 2.1  < > 2.2 2.3 2.1 2.1  --  2.0  PHOS 4.3  < > 3.4  < > 2.2*  < > 2.0*  < > 1.6* 1.7* 1.7* 1.9*  --  2.3*  < > = values in this interval  not displayed. GFR Estimated Creatinine Clearance: 13.5 mL/min (by C-G formula based on Cr of 2.94).  CBC:  Recent Labs Lab 06/17/16 0010 06/18/16 0020 06/20/16 0035 06/22/16 0312  WBC 17.3* 13.9* 11.2* 8.5  HGB 11.1* 10.1* 10.1* 9.6*  HCT 32.4* 29.8* 31.6* 31.7*  MCV 86.6 88.4 91.3 96.1  PLT 196 225 242 281   CBG:  Recent Labs Lab 06/21/16 1642 06/21/16 1956 06/22/16 0006 06/22/16 0437 06/22/16 0757  GLUCAP 251* 204* 151* 223* 194*   Sepsis Labs:  Recent Labs Lab 06/17/16 0010 06/18/16 0020 06/20/16 0035 06/21/16 1121 06/21/16 1526 06/22/16 0312  PROCALCITON  --   --  8.76 6.64 6.34 5.47  WBC 17.3* 13.9* 11.2*  --   --  8.5   Microbiology Recent Results (from the past 240 hour(s))  Culture, blood (routine x 2)     Status: None   Collection Time: 06/13/16  3:25 PM  Result Value Ref Range Status   Specimen Description BLOOD RIGHT ANTECUBITAL  Final   Special Requests BOTTLES DRAWN AEROBIC ONLY 5CC  Final   Culture NO GROWTH 5 DAYS  Final   Report Status 06/18/2016 FINAL  Final  Urine culture     Status: None   Collection Time: 06/13/16  4:15 PM  Result Value Ref Range Status   Specimen Description URINE, CATHETERIZED  Final   Special Requests Normal  Final   Culture NO GROWTH  Final   Report Status 06/14/2016 FINAL  Final  Culture, blood (routine x 2)     Status: None   Collection Time: 06/13/16  5:12 PM  Result Value Ref Range Status   Specimen Description BLOOD RIGHT EJ  Final   Special Requests BOTTLES DRAWN AEROBIC AND ANAEROBIC 5CC  Final   Culture NO GROWTH 5 DAYS  Final   Report Status 06/18/2016 FINAL  Final    Radiology: Dg Chest Port 1 View  06/22/2016  CLINICAL DATA:  Acute respiratory failure EXAM: PORTABLE  CHEST 1 VIEW COMPARISON:  06/20/2016 FINDINGS: 0652 hours. Patient rotated to the right. Lungs are hyperexpanded. Interstitial markings are diffusely coarsened with chronic features. The diffuse bilateral airspace disease seen previously is decreased in the interval. The cardio pericardial silhouette is enlarged. Feeding tube is coiled in the stomach with the tip positioned in the region of the antrum. Telemetry leads overlie the chest. IMPRESSION: Interval decrease in bilateral diffuse airspace disease. Electronically Signed   By: Misty Stanley M.D.   On: 06/22/2016 08:17    Medications:   . antiseptic oral rinse  7 mL Mouth Rinse q12n4p  . carbamide peroxide  5 drop Right Ear BID  . carvedilol  3.125 mg Oral BID WC  . chlorhexidine  15 mL Mouth Rinse BID  . cholecalciferol  5,000 Units Oral Daily  . clopidogrel  75 mg Oral QHS  . fenofibrate  160 mg Oral QHS  . FLUoxetine  20 mg Oral Daily  . gabapentin  200 mg Oral TID  . Gerhardt's butt cream   Topical BID  . heparin subcutaneous  5,000 Units Subcutaneous Q8H  . insulin aspart  0-15 Units Subcutaneous TID WC  . insulin aspart  0-5 Units Subcutaneous QHS  . insulin aspart  4 Units Subcutaneous TID WC  . isosorbide mononitrate  60 mg Oral Daily  . [START ON 06/23/2016] levofloxacin (LEVAQUIN) IV  500 mg Intravenous Q48H  . pantoprazole sodium  40 mg Per Tube Daily  . phosphorus  250 mg Oral TID  . simvastatin  20 mg Oral QHS  . topiramate  100 mg Oral BID   Continuous Infusions:    Time spent: 35 minutes.  The patient is medically complex with multiple co-morbidities and is at high risk for clinical deterioration and requires high complexity decision making.    LOS: 9 days   [place] [facility] Pager [phone]. If unable to reach me by pager, please call my cell phone at [phone].  *Please refer to [email or web address], password [email or web address] to get updated schedule on who will round on this patient, as hospitalists switch teams  weekly. If 7PM-7AM, please contact night-coverage at [email or web address], password [email or web address] for any overnight needs.  06/22/2016, 12:27 PM

## 2016-06-23 ENCOUNTER — Other Ambulatory Visit: Payer: Self-pay

## 2016-06-23 DIAGNOSIS — L8990 Pressure ulcer of unspecified site, unspecified stage: Secondary | ICD-10-CM | POA: Insufficient documentation

## 2016-06-23 LAB — BASIC METABOLIC PANEL
Anion gap: 14 (ref 5–15)
BUN: 117 mg/dL — AB (ref 6–20)
CALCIUM: 8.8 mg/dL — AB (ref 8.9–10.3)
CO2: 31 mmol/L (ref 22–32)
CREATININE: 3.42 mg/dL — AB (ref 0.44–1.00)
Chloride: 93 mmol/L — ABNORMAL LOW (ref 101–111)
GFR calc Af Amer: 15 mL/min — ABNORMAL LOW (ref >60)
GFR, EST NON AFRICAN AMERICAN: 13 mL/min — AB (ref >60)
Glucose, Bld: 354 mg/dL — ABNORMAL HIGH (ref 65–99)
POTASSIUM: 5.3 mmol/L — AB (ref 3.5–5.1)
SODIUM: 138 mmol/L (ref 135–145)

## 2016-06-23 LAB — PHOSPHORUS
PHOSPHORUS: 4.4 mg/dL (ref 2.5–4.6)
PHOSPHORUS: 5.9 mg/dL — AB (ref 2.5–4.6)
Phosphorus: 5.8 mg/dL — ABNORMAL HIGH (ref 2.5–4.6)

## 2016-06-23 LAB — GLUCOSE, CAPILLARY
GLUCOSE-CAPILLARY: 411 mg/dL — AB (ref 65–99)
GLUCOSE-CAPILLARY: 383 mg/dL — AB (ref 65–99)
GLUCOSE-CAPILLARY: 127 mg/dL — AB (ref 65–99)
Glucose-Capillary: 279 mg/dL — ABNORMAL HIGH (ref 65–99)
Glucose-Capillary: 434 mg/dL — ABNORMAL HIGH (ref 65–99)
Glucose-Capillary: 290 mg/dL — ABNORMAL HIGH (ref 65–99)
Glucose-Capillary: 27 mg/dL — CL (ref 65–99)
Glucose-Capillary: 170 mg/dL — ABNORMAL HIGH (ref 65–99)

## 2016-06-23 LAB — MAGNESIUM
MAGNESIUM: 2 mg/dL (ref 1.7–2.4)
Magnesium: 2.1 mg/dL (ref 1.7–2.4)
Magnesium: 2.1 mg/dL (ref 1.7–2.4)

## 2016-06-23 LAB — PROCALCITONIN: PROCALCITONIN: 3.84 ng/mL

## 2016-06-23 MED ORDER — LORATADINE 10 MG PO TABS
10.0000 mg | ORAL_TABLET | Freq: Every day | ORAL | Status: DC
  Administered 2016-06-23 – 2016-07-04 (×11): 10 mg via ORAL
  Filled 2016-06-23 (×12): qty 1

## 2016-06-23 MED ORDER — MENTHOL 3 MG MT LOZG: 1.0000 | LOZENGE | OROMUCOSAL | Status: DC | PRN

## 2016-06-23 MED ORDER — INSULIN ASPART 100 UNIT/ML ~~LOC~~ SOLN
20.0000 [IU] | Freq: Once | SUBCUTANEOUS | Status: AC
  Administered 2016-06-23: 20 [IU] via SUBCUTANEOUS

## 2016-06-23 MED ORDER — SODIUM CHLORIDE 0.9 % IV SOLN
INTRAVENOUS | Status: AC
Start: 2016-06-23 — End: 2016-06-24
  Administered 2016-06-23 (×2): via INTRAVENOUS

## 2016-06-23 MED ORDER — INSULIN ASPART 100 UNIT/ML ~~LOC~~ SOLN: 3.0000 [IU] | Freq: Three times a day (TID) | SUBCUTANEOUS | Status: DC

## 2016-06-23 MED ORDER — SODIUM POLYSTYRENE SULFONATE 15 GM/60ML PO SUSP
15.0000 g | Freq: Once | ORAL | Status: AC
  Administered 2016-06-23: 15 g via ORAL
  Filled 2016-06-23: qty 60

## 2016-06-23 MED ORDER — INSULIN GLARGINE 100 UNIT/ML ~~LOC~~ SOLN: 15.0000 [IU] | SUBCUTANEOUS | Status: DC

## 2016-06-23 MED ORDER — INSULIN ASPART 100 UNIT/ML ~~LOC~~ SOLN: 6.0000 [IU] | Freq: Three times a day (TID) | SUBCUTANEOUS | Status: DC

## 2016-06-23 MED ORDER — DEXTROSE 50 % IV SOLN
25.0000 mL | Freq: Once | INTRAVENOUS | Status: AC
  Administered 2016-06-23: 25 mL via INTRAVENOUS

## 2016-06-23 MED ORDER — GLUCOSE 40 % PO GEL
ORAL | Status: AC
  Filled 2016-06-23: qty 1

## 2016-06-23 MED ORDER — INSULIN ASPART 100 UNIT/ML ~~LOC~~ SOLN: 0.0000 [IU] | Freq: Every day | SUBCUTANEOUS | Status: DC

## 2016-06-23 MED ORDER — INSULIN GLARGINE 100 UNIT/ML ~~LOC~~ SOLN
10.0000 [IU] | SUBCUTANEOUS | Status: DC
  Administered 2016-06-23 – 2016-06-25 (×2): 10 [IU] via SUBCUTANEOUS
  Filled 2016-06-23 (×4): qty 0.1

## 2016-06-23 MED ORDER — INSULIN ASPART 100 UNIT/ML ~~LOC~~ SOLN
6.0000 [IU] | Freq: Three times a day (TID) | SUBCUTANEOUS | Status: DC
  Administered 2016-06-27 – 2016-06-28 (×3): 6 [IU] via SUBCUTANEOUS
  Administered 2016-06-29: 3 [IU] via SUBCUTANEOUS
  Administered 2016-06-30 (×2): 6 [IU] via SUBCUTANEOUS

## 2016-06-23 MED ORDER — INSULIN ASPART 100 UNIT/ML ~~LOC~~ SOLN
0.0000 [IU] | Freq: Three times a day (TID) | SUBCUTANEOUS | Status: DC
  Administered 2016-06-23: 11 [IU] via SUBCUTANEOUS
  Administered 2016-06-24: 7 [IU] via SUBCUTANEOUS
  Administered 2016-06-24: 15 [IU] via SUBCUTANEOUS
  Administered 2016-06-25: 7 [IU] via SUBCUTANEOUS
  Administered 2016-06-25: 4 [IU] via SUBCUTANEOUS
  Administered 2016-06-25: 11 [IU] via SUBCUTANEOUS
  Administered 2016-06-26: 4 [IU] via SUBCUTANEOUS
  Administered 2016-06-26: 7 [IU] via SUBCUTANEOUS
  Administered 2016-06-27: 11 [IU] via SUBCUTANEOUS
  Administered 2016-06-27 – 2016-06-28 (×2): 4 [IU] via SUBCUTANEOUS
  Administered 2016-06-28: 3 [IU] via SUBCUTANEOUS
  Administered 2016-06-29: 10 [IU] via SUBCUTANEOUS
  Administered 2016-06-29: 6 [IU] via SUBCUTANEOUS
  Administered 2016-06-30: 4 [IU] via SUBCUTANEOUS
  Administered 2016-06-30 – 2016-07-01 (×2): 3 [IU] via SUBCUTANEOUS
  Administered 2016-07-01 – 2016-07-02 (×2): 4 [IU] via SUBCUTANEOUS
  Administered 2016-07-02: 3 [IU] via SUBCUTANEOUS
  Administered 2016-07-03: 4 [IU] via SUBCUTANEOUS
  Administered 2016-07-03: 2 [IU] via SUBCUTANEOUS
  Administered 2016-07-04: 4 [IU] via SUBCUTANEOUS

## 2016-06-23 NOTE — Progress Notes (Signed)
[name], Son is notified of this transfer.  Patient is being transferred to 2W08.

## 2016-06-23 NOTE — Progress Notes (Signed)
Hypoglycemic Event  CBG: 27  Treatment: pt ate ice cream and drank sweet tea D50 IV 25 mL given  Symptoms: drowsiness and sweaty   Follow-up CBG: Time: 1805 CBG Result: 127  Possible Reasons for Event: Inadequate meal intake  Comments/MD notified: Dr. Honor Loh

## 2016-06-23 NOTE — Progress Notes (Signed)
Progress Note    [name]  [id number] DOB: 1951-06-02  DOA: 06/13/2016 PCP: [name], MD    Brief Narrative:   [name] is an 65 y.o. female with PMH significant for DM, CAD, HTN, and recent admission for fall resulting in a right femoral neck fracture s/p arthoplasty 06/07/16. Post operative course was fairly uncomplicated with the exception of some mild anemia requiring PRBC transfusion and UTI treated with levaquin. She was discharged to [facility] for rehabilitation on 06/11/16. She was readmitted 06/13/16 with altered mental status and hypotension concerning for sepsis. Lactic acid was WNL. Treated for DKA which was also present on admission.  Assessment/Plan:   Principal Problems:   Acute respiratory failure with hypoxemia (HCC) secondary to pulmonary edema with possible left base HCAP Continue supplemental oxygen and BiPAP as needed/at bedtime. Continue pulmonary hygiene. Continue oxygen to maintain oxygen saturations 88-92 percent. Pro calcitonin is trending down, continue Levaquin for now.    Shock secondary to hypovolemia and probable sepsis Blood and urine cultures negative. Trending Procalcitonin per algorithm. PCT trending up 06/20/16. Urine Strep (-); Legionella Antigen (-) Antibiotics narrowed to Levaquin status post 8 days of therapy with Fortaz and vancomycin. Pro calcitonin beginning to trend down. Repeat chest x-ray showed an interval decrease in bilateral diffuse airspace disease. WBC now normalized.  Active Problems:   CAD (coronary artery disease)/demand ischemia Continue Coreg, labetalol, Imdur, fenofibrate, Zocor and Plavix.    Diabetic ketoacidosis without coma associated with type 2 diabetes mellitus (HCC)/metabolic acidosis Resolved. Diabetes currently being managed with moderate scale with 4 units of meal coverage and 12 units of Lantus. CBGs 199-406. Will change to resistant scale with 6 units of meal coverage and resume  Lantus at 10 units daily.     AKI (acute kidney injury) (HCC)/stage III chronic kidney disease Baseline creatinine 1.9-2.0. Current creatinine remains elevated over usual baseline values. Resume IVF.    Hypertension Continue Coreg and labetalol.    Hyperlipidemia Continue fenofibrate and Zocor.    Possible pancreatitis/elevated lipase Diet has now been advanced. Discontinue NG tube.    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  Monitor counts. No current indication for transfusion. Platelets normalized.    Acute encephalopathy More alert. Neurontin decreased to 200 mg 3 times a day given impaired renal function and ongoing encephalopathy.    Hypophosphatemia Repleted.    Hyperkalemia Kayexalate today.    Anxiety Ativan ordered as needed Q 8 hours. Continue Prozac.  Family Communication/Anticipated D/C date and plan/Code Status   DVT prophylaxis: Heparin ordered. Code Status: Full Code.  Family Communication: Ardis Hughs, called at [phone], updated by telephone 06/22/16. Disposition Plan: Lives alone. Will likely need SNF.   Medical Consultants:    Pulmonology   Procedures:   TTE (06/24/15): EF 55-60%. LA & RA normal in size. RV normal in size and function. No aortic stenosis or regurgitation. Mild mitral regurgitation. Mild tricuspid regurgitation. Port CXR 6/14: Bilateral hilar fullness and lower lobe opacification with silhouetting of the left hemidiaphragm. Port Abd X-ray 6/14: No free air under diaphragm. Right hip hemiarthroplasty. Bowel gas pattern nonobstructive.  Anti-Infectives:   Tressie Ellis 6/14 >>6/22 Vancomycin 6/14 >> 6/22 Levaquin 6/22 >>   Subjective:   [name] is sitting up in the chair, anxious.  Continues to report sore throat and right ear pain.  Also says she is having chest pain.  CBGs elevated this morning.  Objective:  Filed Vitals:   06/22/16 2000 06/22/16 2204 06/23/16  0001 06/23/16 0500  BP: 106/53 113/80 147/90   Pulse: 78  81   Temp:  97.1 F (36.2 C) 97.7 F (36.5 C)   TempSrc:  Oral Oral   Resp: 19     Height:      Weight:    48.2 kg (106 lb 4.2 oz)  SpO2: 98%       Intake/Output Summary (Last 24 hours) at 06/23/16 0732 Last data filed at 06/23/16 0004  Gross per 24 hour  Intake  907.5 ml  Output      0 ml  Net  907.5 ml   Filed Weights   06/21/16 0454 06/22/16 0500 06/23/16 0500  Weight: 45.3 kg (99 lb 13.9 oz) 45.5 kg (100 lb 5 oz) 48.2 kg (106 lb 4.2 oz)    Exam: General exam: Very weak, anxious. HEENT: Panda tube in her right naris. Respiratory system: Clear to auscultation, diminished in the bases. Respiratory effort normal. Cardiovascular system: S1 & S2 heard, mildly tachycardic. No JVD, rubs, gallops or clicks. No murmurs. Gastrointestinal system: Abdomen is nondistended, soft and nontender. No organomegaly or masses felt. Normal bowel sounds heard. Central nervous system: Awake, oriented x2. No focal neurological deficits. Extremities: No significant edema. Skin: No rashes, lesions or ulcers Psychiatry: Judgement and insight appear normal. Mood & affect anxious.   Data Reviewed:   I have personally reviewed following labs and imaging studies:  Labs: Basic Metabolic Panel:  Recent Labs Lab 06/18/16 0020  06/19/16 0501  06/20/16 0035  06/21/16 1526 06/22/16 0002 06/22/16 [id number] 06/22/16 0736 06/22/16 1605 06/23/16 0006 06/23/16 0405  NA 137  --  140  --  145  --   --   --  141  --   --   --  138  K 2.8*  < > 3.5  --  4.5  --   --   --  4.6  --   --   --  5.3*  CL 100*  --  97*  --  102  --   --   --  97*  --   --   --  93*  CO2 25  --  34*  --  35*  --   --   --  39*  --   --   --  31  GLUCOSE 168*  --  309*  --  209*  --   --   --  176*  --   --   --  354*  BUN 55*  --  68*  --  80*  --   --   --  102*  --   --   --  117*  CREATININE 2.36*  --  2.67*  --  2.83*  --   --   --  2.94*  --   --   --  3.42*   CALCIUM 9.2  --  9.3  --  9.7  --   --   --  9.0  --   --   --  8.8*  MG 2.1  < > 2.2  < > 2.1  < > 2.1 2.1  --  2.0 2.1 2.1  --   PHOS 3.4  < > 2.2*  < > 2.0*  < > 1.7* 1.9*  --  2.3* 3.0 4.4  --   < > = values in this interval not displayed. GFR Estimated Creatinine Clearance: 12.3 mL/min (by C-G  formula based on Cr of 3.42).  CBC:  Recent Labs Lab 06/17/16 0010 06/18/16 0020 06/20/16 0035 06/22/16 0312  WBC 17.3* 13.9* 11.2* 8.5  HGB 11.1* 10.1* 10.1* 9.6*  HCT 32.4* 29.8* 31.6* 31.7*  MCV 86.6 88.4 91.3 96.1  PLT 196 225 242 281   CBG:  Recent Labs Lab 06/22/16 0757 06/22/16 1237 06/22/16 1611 06/22/16 1949 06/23/16 0012  GLUCAP 194* 406* 315* 199* 279*   Sepsis Labs:  Recent Labs Lab 06/17/16 0010 06/18/16 0020  06/20/16 0035 06/21/16 1121 06/21/16 1526 06/22/16 0312 06/23/16 0405  PROCALCITON  --   --   < > 8.76 6.64 6.34 5.47 3.84  WBC 17.3* 13.9*  --  11.2*  --   --  8.5  --   < > = values in this interval not displayed. Microbiology Recent Results (from the past 240 hour(s))  Culture, blood (routine x 2)     Status: None   Collection Time: 06/13/16  3:25 PM  Result Value Ref Range Status   Specimen Description BLOOD RIGHT ANTECUBITAL  Final   Special Requests BOTTLES DRAWN AEROBIC ONLY 5CC  Final   Culture NO GROWTH 5 DAYS  Final   Report Status 06/18/2016 FINAL  Final  Urine culture     Status: None   Collection Time: 06/13/16  4:15 PM  Result Value Ref Range Status   Specimen Description URINE, CATHETERIZED  Final   Special Requests Normal  Final   Culture NO GROWTH  Final   Report Status 06/14/2016 FINAL  Final  Culture, blood (routine x 2)     Status: None   Collection Time: 06/13/16  5:12 PM  Result Value Ref Range Status   Specimen Description BLOOD RIGHT EJ  Final   Special Requests BOTTLES DRAWN AEROBIC AND ANAEROBIC 5CC  Final   Culture NO GROWTH 5 DAYS  Final   Report Status 06/18/2016 FINAL  Final    Radiology: Dg Chest  Port 1 View  06/22/2016  CLINICAL DATA:  Acute respiratory failure EXAM: PORTABLE CHEST 1 VIEW COMPARISON:  06/20/2016 FINDINGS: 0652 hours. Patient rotated to the right. Lungs are hyperexpanded. Interstitial markings are diffusely coarsened with chronic features. The diffuse bilateral airspace disease seen previously is decreased in the interval. The cardio pericardial silhouette is enlarged. Feeding tube is coiled in the stomach with the tip positioned in the region of the antrum. Telemetry leads overlie the chest. IMPRESSION: Interval decrease in bilateral diffuse airspace disease. Electronically Signed   By: Misty Stanley M.D.   On: 06/22/2016 08:17    Medications:   . antiseptic oral rinse  7 mL Mouth Rinse q12n4p  . carbamide peroxide  5 drop Right Ear BID  . carvedilol  3.125 mg Oral BID WC  . chlorhexidine  15 mL Mouth Rinse BID  . cholecalciferol  5,000 Units Oral Daily  . clopidogrel  75 mg Oral QHS  . fenofibrate  160 mg Oral QHS  . FLUoxetine  20 mg Oral Daily  . gabapentin  200 mg Oral TID  . Gerhardt's butt cream   Topical BID  . heparin subcutaneous  5,000 Units Subcutaneous Q8H  . insulin aspart  0-15 Units Subcutaneous TID WC  . insulin aspart  0-5 Units Subcutaneous QHS  . insulin aspart  4 Units Subcutaneous TID WC  . isosorbide mononitrate  60 mg Oral Daily  . levofloxacin (LEVAQUIN) IV  500 mg Intravenous Q48H  . pantoprazole sodium  40 mg Per Tube Daily  . simvastatin  20 mg Oral QHS  . topiramate  100 mg Oral BID   Continuous Infusions:    Time spent: 35 minutes.  The patient is medically complex with multiple co-morbidities and is at high risk for clinical deterioration and requires high complexity decision making.    LOS: 10 days   [facility] Pager [phone]. If unable to reach me by pager, please call my cell phone at [phone].  *Please refer to [email or web address], password [email or web address] to get updated schedule on who will round on this patient, as  hospitalists switch teams weekly. If 7PM-7AM, please contact night-coverage at [email or web address], password [email or web address] for any overnight needs.  06/23/2016, 7:32 AM

## 2016-06-23 NOTE — Progress Notes (Signed)
Dr. Rockne Menghini was notifief of the latest CBG result of 383 mg/dl and she called me back and ordered to cover the insulin result according to sliding scale.  Grant Fontana, receiving RN is made aware this new order.

## 2016-06-23 NOTE — Progress Notes (Deleted)
Adult ICU Glycemic Control Treatment Guidelines Sidebar Report  ICU Glycemic Control Protocol Subcutaneous insulin (Phase 1)  1. Monitor CBG every 4 hours 2. Administer correction coverage with insulin aspart (NOVOLOG) subcutaneously every 4 hours according to the sliding scale 3. If CBG < 70 mg/dL use Hypoglycemia Order Set a. In Order Management under Order Sets search for Hypoglycemia Order Set b. Place orders per protocol, cosign required 4. If there is one CBG > 250 mg/dL or two subsequent CBG > 200 mg/dL a. Search for ICU Glycemic Control Order Set (Phase 2) in Order Management under Order Sets and place orders per protocol, cosign required. b. In Order Management select Active Orders and View by Order Set. Discontinue ALL orders from ICU Glycemic Control Order Set (Phase 1) per protocol, cosign required.  ICU Glycemic Control Protocol IV insulin (Phase 2)  1. Monitor CBG every 1 hour 2. Administer regular insulin intravenously per GlucoStabilizer (MDN-CGS) software.   Set high target = 180, low target = 140, multiplier = 0.01   Do NOT use glucose lab values (use CBG readings only)   If CBG reads "Critical High", enter 601   If on TF / TNA and it is discontinued, enter original multiplier 3. If CBG < 70    Follow GlucoStabilizer Instructions   Recheck POCT CBG in 15 minutes   Change multiplier to 0.01 4. Notify MD if   Prompted by GlucoStabilizer:     If insulin drip rate is greater than 30 units/hour, stop insulin drip for 30 minutes and restart GS with original multiplier 0.01   CBG<70 after treating hypoglycemia twice   CBG<250 and there is no IV dextrose, TNA or TF   Withholding insulin for more then 1 hour  5. Initiate Phase III (Transition ICU Glycemic Control Order Set) when all of the following are met:    the insulin infusion rate is < 4 units / hour    tube feeds are at goal rate and stable     there are 6 subsequent CBG readings < 180 mg/dL 6. If patient is on TNA,  await further steps until TNA pharmacist sees patient.    In Order Management under Order Sets search for ICU Glycemic Control Order Set (Phase 3).  Initiate all orders from ICU Glycemic Control Order Set (Phase 2) per protocol, cosign required.   In Order Management select Active Orders and View by Order Set. Discontinue ALL orders form ICU Glycemic Control Order Set (Phase II) per protocol, cosign required.  ICU Glycemic Control Protocol Transition (Phase 3) **Choose appropriate Lantus and tube feed coverage based on last insulin drip rate.  Choose correction Novolog based on patients most recent glomerular filtration rate (GFR).  If GFR less than 30 choose sensitive, and if GFR greater than 30, choose standard. Please verify insulin drip rate/transition doses with 2nd RN.  1. Give first dose of Lantus now and q 24 hours per last insulin drip rate, and discontinue insulin drip 2 hours after Lantus given. 2. Monitor CBG every 4 hours 3. Administer correction insulin (NOVOLOG) [place] according to sliding scale 4. Administer tube feeding coverage (NOVOLOG) as ordered [place] every 4 hours after tubes feeds are at goal. If patient is not receiving TF, it is not at goal, or coverage is not ordered - skip this step.   5. If tube feeding / TNA is stopped, start D10 infusion at 40 mL/h and hold tube feeding coverage.    6. If there is one CBG reading >   250 mg/dL or two subsequent CBG readings > 200 mg/dL switch to ICU Glycemic Control Order Set (Phase II) a. In Order Management select Active Orders and View by Order Set. Discontinue ALL orders form ICU Glycemic Control Order Set (Phase III) per protocol, cosign required. 7. In Order Management under Order Sets search for ICU Glycemic Control Order Set (Phase II).  Initiate ALL orders form ICU Glycemic Control Order Set (Phase II) per protocol, cosign required. 8.  

## 2016-06-23 NOTE — Progress Notes (Signed)
Pt has arrived to 2w from 2c. Pt's vitals are stable. Pt currently has 2 liters of oxygen via nasal cannula. Pt has been oriented to room. Telemetry box has been applied and CCMD has been notified. Pt has no complaints at this time. Will continue to monitor.   Grant Fontana BSN, RN

## 2016-06-24 LAB — BASIC METABOLIC PANEL
Anion gap: 11 (ref 5–15)
BUN: 121 mg/dL — ABNORMAL HIGH (ref 6–20)
CALCIUM: 8 mg/dL — AB (ref 8.9–10.3)
CO2: 34 mmol/L — AB (ref 22–32)
CREATININE: 3.6 mg/dL — AB (ref 0.44–1.00)
Chloride: 97 mmol/L — ABNORMAL LOW (ref 101–111)
GFR calc Af Amer: 14 mL/min — ABNORMAL LOW (ref >60)
GFR calc non Af Amer: 12 mL/min — ABNORMAL LOW (ref >60)
GLUCOSE: 118 mg/dL — AB (ref 65–99)
Potassium: 4.3 mmol/L (ref 3.5–5.1)
Sodium: 142 mmol/L (ref 135–145)

## 2016-06-24 LAB — PHOSPHORUS
Phosphorus: 7 mg/dL — ABNORMAL HIGH (ref 2.5–4.6)
Phosphorus: 7 mg/dL — ABNORMAL HIGH (ref 2.5–4.6)

## 2016-06-24 LAB — MAGNESIUM
MAGNESIUM: 2 mg/dL (ref 1.7–2.4)
Magnesium: 2.1 mg/dL (ref 1.7–2.4)

## 2016-06-24 LAB — GLUCOSE, CAPILLARY
GLUCOSE-CAPILLARY: 329 mg/dL — AB (ref 65–99)
GLUCOSE-CAPILLARY: 77 mg/dL (ref 65–99)
Glucose-Capillary: 79 mg/dL (ref 65–99)
Glucose-Capillary: 153 mg/dL — ABNORMAL HIGH (ref 65–99)
Glucose-Capillary: 229 mg/dL — ABNORMAL HIGH (ref 65–99)

## 2016-06-24 MED ORDER — CALCIUM ACETATE (PHOS BINDER) 667 MG PO CAPS
667.0000 mg | ORAL_CAPSULE | Freq: Three times a day (TID) | ORAL | Status: DC
  Administered 2016-06-24 – 2016-06-26 (×6): 667 mg via ORAL
  Filled 2016-06-24 (×5): qty 1

## 2016-06-24 MED ORDER — TOPIRAMATE 25 MG PO TABS
50.0000 mg | ORAL_TABLET | Freq: Two times a day (BID) | ORAL | Status: DC
  Administered 2016-06-24 – 2016-07-04 (×20): 50 mg via ORAL
  Filled 2016-06-24 (×20): qty 2

## 2016-06-24 MED ORDER — SODIUM CHLORIDE 0.9 % IV SOLN
INTRAVENOUS | Status: AC
  Administered 2016-06-24 – 2016-06-25 (×3): via INTRAVENOUS

## 2016-06-24 NOTE — Progress Notes (Addendum)
Progress Note    [name]  [id number] DOB: September 15, 1951  DOA: 06/13/2016 PCP: [name], MD    Brief Narrative:   [name] is an 65 y.o. female with PMH significant for DM, CAD, HTN, and recent admission for fall resulting in a right femoral neck fracture s/p arthoplasty 06/07/16. Post operative course was fairly uncomplicated with the exception of some mild anemia requiring PRBC transfusion and UTI treated with levaquin. She was discharged to [facility] for rehabilitation on 06/11/16. She was readmitted 06/13/16 with altered mental status and hypotension concerning for sepsis. Lactic acid was WNL. Treated for DKA which was also present on admission.  Assessment/Plan:   Principal Problems:   Acute respiratory failure with hypoxemia (HCC) secondary to pulmonary edema with possible left base HCAP Continue supplemental oxygen and BiPAP as needed/at bedtime. Continue pulmonary hygiene. Continue oxygen to maintain oxygen saturations 88-92 percent. Pro calcitonin is trending down, discontinue Levaquin.    Shock secondary to hypovolemia and probable sepsis Blood and urine cultures negative. Urine Strep (-); Legionella Antigen (-) Antibiotics narrowed to Levaquin status post 8 days of therapy with Fortaz and vancomycin. Pro calcitonin beginning to trend down. Repeat chest x-ray showed an interval decrease in bilateral diffuse airspace disease. WBC now normalized. DC Levaquin.  Active Problems:   CAD (coronary artery disease)/demand ischemia Continue Coreg, labetalol, Imdur, fenofibrate, Zocor and Plavix.    Diabetic ketoacidosis without coma associated with type 2 diabetes mellitus (HCC)/metabolic acidosis Resolved. Diabetes currently being managed with resistant scale SSI with 6 units of meal coverage and 10 units of Lantus. CBGs 79-170. Had one low hypoglycemic episode 06/23/16 after she needed multiple injections of subcutaneous insulin to address marked  elevation of blood glucoses greater than 400.     AKI (acute kidney injury) (HCC)/stage III chronic kidney disease Baseline creatinine 1.9-2.0. Current creatinine remains elevated over usual baseline values. Creatinine higher today despite resumption of IV fluids. Continue IVF.    Hypertension Continue Coreg and labetalol.    Hyperlipidemia Continue fenofibrate and Zocor.    Possible pancreatitis/elevated lipase Diet has now been advanced. NG tube removed.  Poor appetite.    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  Monitor counts. No current indication for transfusion. Platelets normalized.    Acute encephalopathy More alert. Neurontin decreased to 200 mg 3 times a day given impaired renal function and ongoing encephalopathy.Will dose adjust Topamax as well.    Hypo/hyperphosphatemia Initially repleted, now high.  Likely reflective of progressive renal failure.  Will start PhosLo.    Hyperkalemia Resolved s/p Kayexalate 06/23/16.    Anxiety Ativan ordered as needed Q 8 hours. Continue Prozac.  Family Communication/Anticipated D/C date and plan/Code Status   DVT prophylaxis: Heparin ordered. Code Status: Full Code.  Family Communication: Ardis Hughs, called at [phone], updated by telephone 06/22/16. Disposition Plan: Lives alone. Will likely need SNF.   Medical Consultants:    Pulmonology   Procedures:   TTE (06/24/15): EF 55-60%. LA & RA normal in size. RV normal in size and function. No aortic stenosis or regurgitation. Mild mitral regurgitation. Mild tricuspid regurgitation. Port CXR 6/14: Bilateral hilar fullness and lower lobe opacification with silhouetting of the left hemidiaphragm. Port Abd X-ray 6/14: No free air under diaphragm. Right hip hemiarthroplasty. Bowel gas pattern nonobstructive.  Anti-Infectives:   Tressie Ellis 6/14 >>6/22 Vancomycin 6/14 >> 6/22 Levaquin 6/22 >> 06/24/16   Subjective:   [name] says  she has had some nausea and continues to have a poor appetite.  Only ate 10% of breakfast today.  Complains of pain in her sacral area, and mild shortness of breath.  Objective:    Filed Vitals:   06/23/16 1125 06/23/16 1416 06/23/16 1812 06/24/16 0502  BP: 101/37 93/43 94/34  118/92  Pulse: 94 82  87  Temp: 98.1 F (36.7 C) 98 F (36.7 C)  98.6 F (37 C)  TempSrc: Oral Oral  Oral  Resp: 20 20  18   Height:      Weight:      SpO2: 100% 100%  100%    Intake/Output Summary (Last 24 hours) at 06/24/16 0737 Last data filed at 06/23/16 1843  Gross per 24 hour  Intake    240 ml  Output      0 ml  Net    240 ml   Filed Weights   06/21/16 0454 06/22/16 0500 06/23/16 0500  Weight: 45.3 kg (99 lb 13.9 oz) 45.5 kg (100 lb 5 oz) 48.2 kg (106 lb 4.2 oz)    Exam: General exam: Weak, anxious. Respiratory system: Clear to auscultation, diminished in the bases. Respiratory effort normal. Cardiovascular system: S1 & S2 heard, mildly tachycardic. No JVD, rubs, gallops or clicks. No murmurs. Gastrointestinal system: Abdomen is nondistended, soft and nontender. No organomegaly or masses felt. Normal bowel sounds heard. Central nervous system: Awake, oriented x2. No focal neurological deficits. Extremities: No significant edema. Skin: No rashes, lesions or ulcers Psychiatry: Judgement and insight appear normal. Mood & affect anxious.   Data Reviewed:   I have personally reviewed following labs and imaging studies:  Labs: Basic Metabolic Panel:  Recent Labs Lab 06/19/16 0501  06/20/16 0035  06/22/16 0312  06/22/16 1605 06/23/16 0006 06/23/16 0405 06/23/16 0801 06/23/16 1603 06/24/16  NA 140  --  145  --  141  --   --   --  138  --   --  142  K 3.5  --  4.5  --  4.6  --   --   --  5.3*  --   --  4.3  CL 97*  --  102  --  97*  --   --   --  93*  --   --  97*  CO2 34*  --  35*  --  39*  --   --   --  31  --   --  34*  GLUCOSE 309*  --  209*  --  176*  --   --   --   354*  --   --  118*  BUN 68*  --  80*  --  102*  --   --   --  117*  --   --  121*  CREATININE 2.67*  --  2.83*  --  2.94*  --   --   --  3.42*  --   --  3.60*  CALCIUM 9.3  --  9.7  --  9.0  --   --   --  8.8*  --   --  8.0*  MG 2.2  < > 2.1  < >  --   < > 2.1 2.1  --  2.0 2.1 2.1  PHOS 2.2*  < > 2.0*  < >  --   < > 3.0 4.4  --  5.8* 5.9* 7.0*  < > = values in this interval not displayed. GFR Estimated Creatinine Clearance: 11.7 mL/min (by  C-G formula based on Cr of 3.6).  CBC:  Recent Labs Lab 06/18/16 0020 06/20/16 0035 06/22/16 0312  WBC 13.9* 11.2* 8.5  HGB 10.1* 10.1* 9.6*  HCT 29.8* 31.6* 31.7*  MCV 88.4 91.3 96.1  PLT 225 242 281   CBG:  Recent Labs Lab 06/23/16 1737 06/23/16 1805 06/23/16 2055 06/24/16 0550 06/24/16 0650  GLUCAP 27* 127* 170* 79 153*   Sepsis Labs:  Recent Labs Lab 06/18/16 0020  06/20/16 0035 06/21/16 1121 06/21/16 1526 06/22/16 0312 06/23/16 0405  PROCALCITON  --   < > 8.76 6.64 6.34 5.47 3.84  WBC 13.9*  --  11.2*  --   --  8.5  --   < > = values in this interval not displayed. Microbiology No results found for this or any previous visit (from the past 240 hour(s)).  Radiology: No results found.  Medications:   . antiseptic oral rinse  7 mL Mouth Rinse q12n4p  . carbamide peroxide  5 drop Right Ear BID  . carvedilol  3.125 mg Oral BID WC  . chlorhexidine  15 mL Mouth Rinse BID  . cholecalciferol  5,000 Units Oral Daily  . clopidogrel  75 mg Oral QHS  . fenofibrate  160 mg Oral QHS  . FLUoxetine  20 mg Oral Daily  . gabapentin  200 mg Oral TID  . Gerhardt's butt cream   Topical BID  . heparin subcutaneous  5,000 Units Subcutaneous Q8H  . insulin aspart  0-20 Units Subcutaneous TID WC  . insulin aspart  0-5 Units Subcutaneous QHS  . insulin aspart  6 Units Subcutaneous TID WC  . insulin glargine  10 Units Subcutaneous [id number]  . isosorbide mononitrate  60 mg Oral Daily  . levofloxacin (LEVAQUIN) IV  500 mg Intravenous  Q48H  . loratadine  10 mg Oral Daily  . pantoprazole sodium  40 mg Per Tube Daily  . simvastatin  20 mg Oral QHS  . topiramate  100 mg Oral BID   Continuous Infusions: . sodium chloride 125 mL/hr at 06/23/16 1749    Time spent: 35 minutes.  The patient is medically complex with multiple co-morbidities and is at high risk for clinical deterioration and requires high complexity decision making.    LOS: 11 days   [facility] Pager [phone]. If unable to reach me by pager, please call my cell phone at [phone].  *Please refer to [email or web address], password [email or web address] to get updated schedule on who will round on this patient, as hospitalists switch teams weekly. If 7PM-7AM, please contact night-coverage at [email or web address], password [email or web address] for any overnight needs.  06/24/2016, 7:37 AM

## 2016-06-24 NOTE — Clinical Social Work Note (Signed)
CSW continues to follow the patient for placement needs.   Liz Beach MSW, [place], [place], JI:[id number]

## 2016-06-25 ENCOUNTER — Inpatient Hospital Stay (HOSPITAL_COMMUNITY): Payer: Commercial Managed Care - HMO

## 2016-06-25 LAB — GLUCOSE, CAPILLARY
GLUCOSE-CAPILLARY: 279 mg/dL — AB (ref 65–99)
Glucose-Capillary: 151 mg/dL — ABNORMAL HIGH (ref 65–99)
Glucose-Capillary: 229 mg/dL — ABNORMAL HIGH (ref 65–99)
Glucose-Capillary: 62 mg/dL — ABNORMAL LOW (ref 65–99)
Glucose-Capillary: 85 mg/dL (ref 65–99)

## 2016-06-25 LAB — RENAL FUNCTION PANEL
ALBUMIN: 1.7 g/dL — AB (ref 3.5–5.0)
ANION GAP: 10 (ref 5–15)
BUN: 108 mg/dL — ABNORMAL HIGH (ref 6–20)
CO2: 29 mmol/L (ref 22–32)
Calcium: 7.8 mg/dL — ABNORMAL LOW (ref 8.9–10.3)
Chloride: 101 mmol/L (ref 101–111)
Creatinine, Ser: 3.79 mg/dL — ABNORMAL HIGH (ref 0.44–1.00)
GFR calc Af Amer: 13 mL/min — ABNORMAL LOW (ref >60)
GFR, EST NON AFRICAN AMERICAN: 11 mL/min — AB (ref >60)
GLUCOSE: 202 mg/dL — AB (ref 65–99)
PHOSPHORUS: 6.8 mg/dL — AB (ref 2.5–4.6)
POTASSIUM: 4.3 mmol/L (ref 3.5–5.1)
Sodium: 140 mmol/L (ref 135–145)

## 2016-06-25 MED ORDER — SODIUM CHLORIDE 0.9 % IV SOLN
INTRAVENOUS | Status: DC
  Administered 2016-06-25 – 2016-06-26 (×2): via INTRAVENOUS

## 2016-06-25 MED ORDER — PANTOPRAZOLE SODIUM 40 MG PO TBEC
40.0000 mg | DELAYED_RELEASE_TABLET | Freq: Every day | ORAL | Status: DC
  Administered 2016-06-25 – 2016-06-26 (×2): 40 mg via ORAL
  Filled 2016-06-25 (×2): qty 1

## 2016-06-25 MED ORDER — CALCIUM CARBONATE ANTACID 500 MG PO CHEW
1.0000 | CHEWABLE_TABLET | Freq: Three times a day (TID) | ORAL | Status: DC | PRN
  Administered 2016-06-25 – 2016-07-02 (×8): 200 mg via ORAL
  Filled 2016-06-25 (×10): qty 1

## 2016-06-25 MED ORDER — GLUCERNA SHAKE PO LIQD
237.0000 mL | Freq: Three times a day (TID) | ORAL | Status: DC
  Administered 2016-06-25 – 2016-07-03 (×12): 237 mL via ORAL

## 2016-06-25 MED ORDER — SODIUM CHLORIDE 0.9 % IV SOLN: INTRAVENOUS | Status: AC

## 2016-06-25 NOTE — Consult Note (Signed)
[facility] CONSULT NOTE    Date: 02/08/2016                  Patient Name:  [name]  MRN: [id number]  DOB: [birth year]  Age / Sex: 65 y.o., female         PCP: [name], MD                 Service Requesting Consult: [facility], Dr. Rockne Menghini                 Reason for Consult: AKI, CKD stage III            History of Present Illness: Patient is a 65 y.o. female with a PMHx of diabetes mellitus type 2, hyperlipidemia, MGUS, anemia chronic kidney disease, hypertension, coronary artery disease, GERD, chronic kidney disease stage III, depression, who was admitted to [facility] on 6/14 with hypotension and concern for sepsis.    She was recently admitted from 6/6 to 6/12 after suffering a fall resulting in a right femoral neck fracture.  Underwent arthoplasty on 6/8.  Hospital course was fairly complicated by some mild anemia requiring PRBC transfusion and UTI treated with Levaquin.  She was discharged to [facility] on 6/12.    On 6/14, she was readmitted with AMS and hypotension concerning for sepsis.  Since she was treated for hyperglycemia, pulmonary edema, and possible HCAP (completed abx therapy 6/25 of fortaz, vancomycin, and levaquin).  Blood and urine cultures were negative.    She was last seen by Dr. Holley Raring on 02/09/16 for evaluation of her AKI and rule out chest pain.  Her baseline creatinine was 1.9 to 2.05 with an EGFR of 24 at that time .  However, outpatient follow-up has been lost.  Called for evaluation of continued elevated creatine (3.82 ->2.54 -> 3.79) despite IVFs and ongoing electrolyte imbalances. Her foley was taken out on 6/22 and urine output is questionable.    Subjective: Poor appetite and feels weak.  Has voided once today.    Allergies: Allergies  Allergen Reactions  . Hydralazine Hcl Swelling  . Bisphosphonates Rash  . Codeine Phosphate Nausea And  Vomiting and Rash  . Penicillins Itching, Rash and Other (See Comments)    Has patient had a PCN reaction causing immediate rash, facial/tongue/throat swelling, SOB or lightheadedness with hypotension: No Has patient had a PCN reaction causing severe rash involving mucus membranes or skin necrosis: No Has patient had a PCN reaction that required hospitalization No Has patient had a PCN reaction occurring within the last 10 years: No If all of the above answers are "NO", then may proceed with Cephalosporin use.  . Sulfa Antibiotics Itching and Rash      Past Medical History: Past Medical History  Diagnosis Date  . DIABETES MELLITUS, II, COMPLICATIONS 5/69/7948  . HYPERLIPIDEMIA 02/27/2007  . 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 ANEMIA, OTHER, UNSPECIFIED 02/27/2007  . DEPRESSIVE DISORDER, NOS 02/27/2007  . HYPERTENSION, BENIGN SYSTEMIC 02/27/2007  . CORONARY, ARTERIOSCLEROSIS 02/27/2007  . TIA 05/17/2009  . GASTROESOPHAGEAL REFLUX, NO ESOPHAGITIS 02/27/2007  . RENAL INSUFFICIENCY, CHRONIC 12/28/2009  . Renal failure, acute on chronic (HCC) 01/25/2014  . Blood transfusion without reported diagnosis   . Anemia           Medications:   . antiseptic oral rinse 7 mL Mouth Rinse q12n4p  . calcium acetate 667 mg Oral TID WC  . carbamide peroxide 5 drop Right Ear BID  . carvedilol  3.125 mg Oral BID WC  . chlorhexidine 15 mL Mouth Rinse BID  . cholecalciferol 5,000 Units Oral Daily  . clopidogrel 75 mg Oral QHS  . fenofibrate 160 mg Oral QHS  . FLUoxetine 20 mg Oral Daily  . gabapentin 200 mg Oral TID  . Gerhardt's butt cream  Topical BID  . heparin subcutaneous 5,000 Units Subcutaneous Q8H  . insulin aspart 0-20 Units Subcutaneous TID WC  . insulin aspart 0-5 Units Subcutaneous QHS  . insulin aspart 6 Units Subcutaneous TID WC  . insulin  glargine 10 Units Subcutaneous BH-q7a  . isosorbide mononitrate 60 mg Oral Daily  . loratadine 10 mg Oral Daily  . pantoprazole sodium 40 mg Per Tube Daily  . simvastatin 20 mg Oral QHS  . topiramate 50 mg Oral BID   Continuous Infusions: . sodium chloride 75 mL/hr at 06/25/16 0510         06/22/16 0500 06/23/16 0500 06/25/16 0207  Weight: 45.5 kg (100 lb 5 oz) 48.2 kg (106 lb 4.2 oz) 55.8 kg (123 lb 0.3 oz)    Exam: General exam:  Chronically ill appearing female sitting in chair, mildly anxious, NAD.  Hard of hearing. Respiratory system: Clear to auscultation, diminished in the bases. Respiratory effort normal. Cardiovascular system: S1 & S2 heard. No JVD, rubs, gallops or clicks. No murmurs. Gastrointestinal system: Abdomen is nondistended, soft and nontender. No organomegaly or masses felt. Normal bowel sounds heard. Central nervous system: Awake and oriented. No focal neurological deficits. Extremities: No significant edema.  Skin warm and dry.   Skin: No rashes, lesions or ulcers       Assessment & Plan: Pt is a 65 y.o. female with a PMHx of CKD stage IV, diabetes mellitus type 2, hyperlipidemia, MGUS, anemia, hypertension, coronary artery disease, GERD,  depression, who was admitted on 6/14 with AMS, hypotension, hyperglycemia, and likely sepsis/HCAP.  Renal function initially improved from admission, but since 6/20 has started to decline.    1. Acute renal failure/chronic kidney disease stage IV- eGFR now 11. sCr 3.82 ->2.54 -> 3.79 this admission.  Euvolemic on exam.   - Adjust current medications for renal impairment - Obtain renal ultrasound to evaluate for obstruction - Measure post void residual - foley taken out 6/22 - Strict I/O's - UA now - Serum protein electrophoresis now - Trend daily bmet, mag, phos  2.  Anemia- stable - trend H/H  3.  Hyperkalemia- resolved s/p kayexalate 06/23/16.  4.3 today -Monitor  daily BMET  4.  Hyperphosphatemia- improving on Phoslo (started 6/23) - continue Phoslo - daily phos   5.  Hypertension- on Coreg and Labetalol; BP has been  - avoid hypotension         Agree with above; please see consult under Dr. Augustin Coupe.

## 2016-06-25 NOTE — Progress Notes (Signed)
Progress Note    [name]  [id number] DOB: 04/12/1951  DOA: 06/13/2016 PCP: [name], MD    Brief Narrative:   [name] is an 65 y.o. female with PMH significant for DM, CAD, HTN, and recent admission for fall resulting in a right femoral neck fracture s/p arthoplasty 06/07/16. Post operative course was fairly uncomplicated with the exception of some mild anemia requiring PRBC transfusion and UTI treated with levaquin. She was discharged to [facility] for rehabilitation on 06/11/16. She was readmitted 06/13/16 with altered mental status and hypotension concerning for sepsis. Lactic acid was WNL. Treated for DKA which was also present on admission.  Assessment/Plan:   Principal Problems:   Acute respiratory failure with hypoxemia (HCC) secondary to pulmonary edema with possible left base HCAP Continue supplemental oxygen and BiPAP as needed/at bedtime. Continue pulmonary hygiene. Continue oxygen to maintain oxygen saturations 88-92 percent. Pro calcitonin is trending down, Levaquin discontinued 06/24/16.    Shock secondary to hypovolemia and probable sepsis Blood and urine cultures negative. Urine Strep (-); Legionella Antigen (-) Antibiotics narrowed to Levaquin status post 8 days of therapy with Fortaz and vancomycin. Pro calcitonin beginning to trend down. Repeat chest x-ray showed an interval decrease in bilateral diffuse airspace disease. WBC now normalized. Antibiotics stopped 06/24/16.  Active Problems:   CAD (coronary artery disease)/demand ischemia Continue Coreg, labetalol, Imdur, fenofibrate, Zocor and Plavix.    Diabetic ketoacidosis without coma associated with type 2 diabetes mellitus (HCC)/metabolic acidosis Resolved. Diabetes currently being managed with resistant scale SSI with 6 units of meal coverage and 10 units of Lantus. CBGs 77-329.     AKI (acute kidney injury) (HCC)/stage III-IV chronic kidney disease Baseline creatinine 1.9-2.0.  Current creatinine remains elevated over usual baseline values. Creatinine higher today despite resumption of IV fluids. Continue IVF. We'll ask nephrology to evaluate. May be moving towards HD given lack of recovery of renal function and ongoing problems with electrolytes and encephalopathy.    Hypertension Continue Coreg and labetalol.    Hyperlipidemia Continue fenofibrate and Zocor.    Possible pancreatitis/elevated lipase Diet has now been advanced. NG tube removed.  Poor appetite.    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  Monitor counts. No current indication for transfusion. Platelets normalized.    Acute encephalopathy More alert. Neurontin & Topamax dose adjusted given impaired renal function. Encephalopathy improved, but continues to be frail.    Hypo/hyperphosphatemia Initially repleted, now high.  Likely reflective of progressive renal failure.  Continue PhosLo.    Hyperkalemia Resolved s/p Kayexalate 06/23/16.    Anxiety Ativan ordered as needed Q 8 hours. Continue Prozac.  Family Communication/Anticipated D/C date and plan/Code Status   DVT prophylaxis: Heparin ordered. Code Status: Full Code.  Family Communication: Ardis Hughs, called at [phone], updated by telephone. Disposition Plan: Lives alone. Will likely need SNF.   Medical Consultants:    Pulmonology   Procedures:   TTE (06/24/15): EF 55-60%. LA & RA normal in size. RV normal in size and function. No aortic stenosis or regurgitation. Mild mitral regurgitation. Mild tricuspid regurgitation. Port CXR 6/14: Bilateral hilar fullness and lower lobe opacification with silhouetting of the left hemidiaphragm. Port Abd X-ray 6/14: No free air under diaphragm. Right hip hemiarthroplasty. Bowel gas pattern nonobstructive.  Anti-Infectives:   Tressie Ellis 6/14 >>6/22 Vancomycin 6/14 >> 6/22 Levaquin 6/22 >> 06/24/16  Subjective:   [name] says she  feels weak.  Sitting up  in the chair.  Multiple complaints.  Continues to have a poor appetite. Ambulated 6 feet or so with the physical therapist today.  Objective:    Filed Vitals:   06/24/16 1342 06/24/16 2115 06/25/16 0202 06/25/16 0207  BP: 92/50 132/72 133/47   Pulse: 82 79 107   Temp: 98.4 F (36.9 C) 98.2 F (36.8 C) 98.2 F (36.8 C)   TempSrc: Oral Oral Oral   Resp: 24 20 18    Height:      Weight:    55.8 kg (123 lb 0.3 oz)  SpO2: 100% 100% 100%     Intake/Output Summary (Last 24 hours) at 06/25/16 0718 Last data filed at 06/24/16 1900  Gross per 24 hour  Intake    702 ml  Output      0 ml  Net    702 ml   Filed Weights   06/22/16 0500 06/23/16 0500 06/25/16 0207  Weight: 45.5 kg (100 lb 5 oz) 48.2 kg (106 lb 4.2 oz) 55.8 kg (123 lb 0.3 oz)    Exam: General exam: Weak, anxious. Respiratory system: Clear to auscultation, diminished in the bases. Respiratory effort normal. Cardiovascular system: S1 & S2 heard, mildly tachycardic. No JVD, rubs, gallops or clicks. No murmurs. Gastrointestinal system: Abdomen is nondistended, soft and nontender. No organomegaly or masses felt. Normal bowel sounds heard. Central nervous system: Awake, oriented x2. No focal neurological deficits. Extremities: No significant edema. Skin: No rashes, lesions or ulcers Psychiatry: Judgement and insight appear normal. Mood & affect anxious.   Data Reviewed:   I have personally reviewed following labs and imaging studies:  Labs: Basic Metabolic Panel:  Recent Labs Lab 06/20/16 0035  06/22/16 0312  06/23/16 0006 06/23/16 0405 06/23/16 0801 06/23/16 1603 06/24/16 06/24/16 0727 06/25/16 0322  NA 145  --  141  --   --  138  --   --  142  --  140  K 4.5  --  4.6  --   --  5.3*  --   --  4.3  --  4.3  CL 102  --  97*  --   --  93*  --   --  97*  --  101  CO2 35*  --  39*  --   --  31  --   --  34*  --  29  GLUCOSE 209*  --  176*  --   --  354*  --   --  118*  --  202*  BUN 80*   --  102*  --   --  117*  --   --  121*  --  108*  CREATININE 2.83*  --  2.94*  --   --  3.42*  --   --  3.60*  --  3.79*  CALCIUM 9.7  --  9.0  --   --  8.8*  --   --  8.0*  --  7.8*  MG 2.1  < >  --   < > 2.1  --  2.0 2.1 2.1 2.0  --   PHOS 2.0*  < >  --   < > 4.4  --  5.8* 5.9* 7.0* 7.0* 6.8*  < > = values in this interval not displayed. GFR Estimated Creatinine Clearance: 12.1 mL/min (by C-G formula based on Cr of 3.79).  CBC:  Recent Labs Lab 06/20/16 0035 06/22/16 0312  WBC 11.2* 8.5  HGB 10.1* 9.6*  HCT 31.6* 31.7*  MCV 91.3 96.1  PLT 242 281   CBG:  Recent Labs Lab 06/24/16 0650 06/24/16 1204 06/24/16 1618 06/24/16 2119 06/25/16 0602  GLUCAP 153* 329* 229* 77 279*   Sepsis Labs:  Recent Labs Lab 06/20/16 0035 06/21/16 1121 06/21/16 1526 06/22/16 0312 06/23/16 0405  PROCALCITON 8.76 6.64 6.34 5.47 3.84  WBC 11.2*  --   --  8.5  --    Microbiology No results found for this or any previous visit (from the past 240 hour(s)).  Radiology: No results found.  Medications:   . antiseptic oral rinse  7 mL Mouth Rinse q12n4p  . calcium acetate  667 mg Oral TID WC  . carbamide peroxide  5 drop Right Ear BID  . carvedilol  3.125 mg Oral BID WC  . chlorhexidine  15 mL Mouth Rinse BID  . cholecalciferol  5,000 Units Oral Daily  . clopidogrel  75 mg Oral QHS  . fenofibrate  160 mg Oral QHS  . FLUoxetine  20 mg Oral Daily  . gabapentin  200 mg Oral TID  . Gerhardt's butt cream   Topical BID  . heparin subcutaneous  5,000 Units Subcutaneous Q8H  . insulin aspart  0-20 Units Subcutaneous TID WC  . insulin aspart  0-5 Units Subcutaneous QHS  . insulin aspart  6 Units Subcutaneous TID WC  . insulin glargine  10 Units Subcutaneous BH-q7a  . isosorbide mononitrate  60 mg Oral Daily  . loratadine  10 mg Oral Daily  . pantoprazole sodium  40 mg Per Tube Daily  . simvastatin  20 mg Oral QHS  . topiramate  50 mg Oral BID   Continuous Infusions: . sodium chloride  75 mL/hr at 06/25/16 0510    Time spent: 35 minutes.  The patient is medically complex with multiple co-morbidities and is at high risk for clinical deterioration and requires high complexity decision making.    LOS: 12 days   [facility] Pager [phone]. If unable to reach me by pager, please call my cell phone at [phone].  *Please refer to [email or web address], password [email or web address] to get updated schedule on who will round on this patient, as hospitalists switch teams weekly. If 7PM-7AM, please contact night-coverage at [email or web address], password [email or web address] for any overnight needs.  06/25/2016, 7:18 AM

## 2016-06-25 NOTE — Progress Notes (Signed)
Nutrition Consult/Follow Up  DOCUMENTATION CODES:   Not applicable  INTERVENTION:   Glucerna Shake po TID, each supplement provides 220 kcal and 10 grams of protein  NUTRITION DIAGNOSIS:   Inadequate oral intake now related to dysphagia as evidenced by meal completion < 50%, ongoing  GOAL:   Patient will meet greater than or equal to 90% of their needs, progressing  MONITOR:   PO intake, Supplement acceptance, Weight trends, Skin, Labs, I & O's  ASSESSMENT:   65 year old Female with PMH as below, which includes DM, CAD, HTN, and recent admission for R femoral neck fracture s/p arthoplasty 6/8. She had suffered several fall prior to the fall that caused the fracture. Post operative course was fairly uncomplicated with the exception of some mild anemia requiring PRBC transfusion and UTI treated with levaquin. She was discharged to [facility] for rehabilitation on 6/12. 6/14 she returned to ER with chief complaint of AMS with associated hypotension. In the emergency department she was noted to be lethargic and confused. She was complaining of abdominal pain. She was hypotensive and hypothermic with elevated WBC. Glucose noted to be markedly elevated, however urine ketones negative. With metabolic acidosis. Lactic acid wnl. Due to persistent hypotension and concern for septic shock PCCM asked to admit.   Patient working with OT >> did not disturb. TF (Glucerna 1.2 formula) via [place] small bore feeding tube discontinued 6/23. Speech Path following >> advanced to Dys 2-thin liquids. PO intake variable at 10-90% per flowsheet records. Would benefit from oral nutrition suplements >> will order.  Diet Order:  DIET DYS 2 Room service appropriate?: Yes; Fluid consistency:: Thin  Skin:  Wound (see comment) (Stage II to coccyx)   CBG (last 3)   Recent Labs  06/24/16 1618 06/24/16 2119 06/25/16 0602  GLUCAP 229* 77 279*    Last BM:  6/25  Height:   Ht Readings from Last 1  Encounters:  06/13/16 5\' 3"  (1.6 m)    Weight:   Wt Readings from Last 1 Encounters:  06/25/16 123 lb 0.3 oz (55.8 kg)    Ideal Body Weight:  52 kg  BMI:  Body mass index is 21.8 kg/(m^2).  Estimated Nutritional Needs:   Kcal:  1500-1700  Protein:  70-80 gm  Fluid:  1.5-1.7 L  EDUCATION NEEDS:   No education needs identified at this time  Arthur Holms, RD, LDN Pager #: [phone] After-Hours Pager #: [phone]

## 2016-06-25 NOTE — Consult Note (Signed)
Reason for Consult: Acute on Chronic Renal Failure Referring Physician: Dr. Margreta Journey Rama  Chief Complaint: Acute encephalopathy  Assessment/Plan: 1. Acute Kidney Injury on CKD IV - patient already with advanced CKD IV even with a baseline creatinine of 1.9-2.2 based on her age and body mass. - She has had a few hypotensive episodes but none sustained and did not receive contrast during this hospitalization. - Urine microscopy which I personally reviewed showed only a few pigmented casts, yeast, ~3 WBC/hpf and no RBC's. - Postvoid residual and renal ultrasound. - SFLC + SPEP + UPC - Will give her 536m NS x2 as tolerated and if no improvement overnight then I am leaning towards a renal biopsy. Certainly may be medication related with cephalosporins, vanc, Topamax, fenofibrate + sepsis --> ATN.  - If there is thinning of the cortex I would not biopsy given that she may be far along with little tissue to salvage. 2. Shock resolved with sepsis and negative urine + blood cultures now off Abx (had been treated with Levaquin --> Fortaz + Vancomycin completed on 6/25) 3. CASHD - [facility] 4. Hypertension - fairly well controlled. 5. MGUS - repeat SPEP and SFLC 6. Hyperphosphatemia - binders. 7. Acute encephalopathy - primary team appropriately adjusting per impaired renal function.   HPI: [name] an 66y.o. female  with a PMHx of diabetes mellitus type 2, hyperlipidemia, MGUS, anemia chronic kidney disease, hypertension, coronary artery disease, GERD, chronic kidney disease stage IV BL Cr 1.9-2.2, depression, [facility] and concern for [facility].   She was recently admitted from 6/6 to 6/12 after suffering a fall resulting in a right femoral neck fracture. Underwent arthoplasty on 6/8. Hospital course was fairly complicated by some mild anemia requiring PRBC transfusion and UTI treated with Levaquin. [facility] but readmiited 2 days later with AMS and hypotension concerning for sepsis. She was found to have leukocyturia on U/A with otherwise negative cultures and treated with Levaquin --> Fortaz + Vancomycin completed on 6/25.   She was last seen by Dr. LHolley Raringon 02/09/16 for evaluation of her AKI and rule out chest pain. Her baseline creatinine was 1.9 to 2.05 with an EGFR of 24 at that time . However, outpatient follow-up has been lost. [facility] Her creatinine at presentation was in the 3's and initially improved for a few days before a continued trend towards a decline in renal function (3.82 ->2.54 -> 3.79) despite IVFs.  Her foley was taken out on 6/22 and urine output is questionable.  Review of Systems  Constitutional: Positive for malaise/fatigue. Negative for fever and chills.  HENT: Negative for nosebleeds, sore throat and tinnitus.   Eyes: Positive for blurred vision and double vision.  Respiratory: Negative for cough, hemoptysis and sputum production.   Cardiovascular: Positive for chest pain. Negative for orthopnea and PND.  Gastrointestinal: Negative for heartburn, nausea and vomiting.  Genitourinary: Negative for dysuria and frequency.  Musculoskeletal: Negative for myalgias and neck pain.  Skin: Negative for itching and rash.  Neurological: Positive for weakness. Negative for dizziness, speech change and headaches.  Endo/Heme/Allergies: Positive for environmental allergies. Does not bruise/bleed easily.  Psychiatric/Behavioral: Negative for depression and suicidal ideas.   Pertinent items are noted in HPI.  Chemistry and CBC: CREATININE  Date/Time Value Ref Range Status  09/28/2014 12:21 PM 2.00*  0.60-1.30 mg/dL Final  05/04/2014 09:14 AM 2.01* 0.60-1.30 mg/dL Final  02/05/2014 04:09 PM 1.9* 0.6 - 1.1 mg/dL Final  01/25/2014 01:33 PM 2.1* 0.6 - 1.1 mg/dL Final  10/18/2009 11:10 AM 4.54* 0.40 - 1.20  mg/dL Final    Comment:    Result repeated and verified.   CREAT  Date/Time Value Ref Range Status  02/09/2010 03:12 PM 1.2 0.6 - 1.2 mg/dl Final   CREATININE, SER  Date/Time Value Ref Range Status  06/25/2016 03:22 AM 3.79* 0.44 - 1.00 mg/dL Final  06/24/2016 12:00 AM 3.60* 0.44 - 1.00 mg/dL Final  06/23/2016 04:05 AM 3.42* 0.44 - 1.00 mg/dL Final  06/22/2016 03:12 AM 2.94* 0.44 - 1.00 mg/dL Final  06/20/2016 12:35 AM 2.83* 0.44 - 1.00 mg/dL Final  06/19/2016 05:01 AM 2.67* 0.44 - 1.00 mg/dL Final  06/18/2016 12:20 AM 2.36* 0.44 - 1.00 mg/dL Final  06/17/2016 12:10 AM 2.54* 0.44 - 1.00 mg/dL Final  06/16/2016 12:05 AM 2.60* 0.44 - 1.00 mg/dL Final  06/15/2016 02:03 AM 2.91* 0.44 - 1.00 mg/dL Final  06/14/2016 09:39 AM 2.92* 0.44 - 1.00 mg/dL Final  06/14/2016 03:20 AM 3.06* 0.44 - 1.00 mg/dL Final  06/14/2016 12:42 AM 3.12* 0.44 - 1.00 mg/dL Final  06/13/2016 08:20 PM 3.24* 0.44 - 1.00 mg/dL Final  06/13/2016 04:18 PM 3.82* 0.44 - 1.00 mg/dL Final  06/10/2016 08:15 AM 1.94* 0.44 - 1.00 mg/dL Final  06/09/2016 02:58 AM 2.02* 0.44 - 1.00 mg/dL Final  06/08/2016 04:02 AM 1.63* 0.44 - 1.00 mg/dL Final  06/07/2016 03:50 AM 1.51* 0.44 - 1.00 mg/dL Final  06/06/2016 06:52 AM 1.82* 0.44 - 1.00 mg/dL Final  06/05/2016 05:40 PM 2.08* 0.44 - 1.00 mg/dL Final  03/10/2016 05:36 AM 2.28* 0.44 - 1.00 mg/dL Final  03/09/2016 02:08 PM 1.99* 0.44 - 1.00 mg/dL Final  02/24/2016 10:16 AM 2.04* 0.44 - 1.00 mg/dL Final  02/10/2016 11:33 AM 2.40* 0.44 - 1.00 mg/dL Final  02/09/2016 06:19 AM 2.02* 0.44 - 1.00 mg/dL Final  02/08/2016 05:44 AM 2.31* 0.44 - 1.00 mg/dL Final  02/07/2016 05:08 PM 2.91* 0.44 - 1.00 mg/dL Final  06/25/2015 05:02 AM 2.05* 0.44 - 1.00 mg/dL Final  06/24/2015 04:31 AM 2.05* 0.44 - 1.00 mg/dL Final  06/23/2015 04:35 PM 2.52* 0.44 - 1.00 mg/dL Final  06/20/2015 02:59 PM 2.04* 0.44 - 1.00 mg/dL Final  10/22/2012 04:34 PM 1.31* 0.50 - 1.10 mg/dL Final  03/12/2012 02:52 PM  1.48* 0.50 - 1.10 mg/dL Final  11/21/2011 03:04 PM 1.31* 0.50 - 1.10 mg/dL Final  07/25/2011 01:33 PM 1.47* 0.50 - 1.10 mg/dL Final  03/28/2011 04:11 PM 1.28* 0.40 - 1.20 mg/dL Final  11/22/2010 11:33 AM 1.20 0.40 - 1.20 mg/dL Final  04/20/2010 01:09 PM 1.3* 0.4-1.2 mg/dL Final  04/13/2010 03:57 PM 1.1 0.4-1.2 mg/dL Final  04/06/2010 03:21 PM 1.1 0.4-1.2 mg/dL Final  03/23/2010 02:16 PM 1.1 0.4-1.2 mg/dL Final  03/13/2010 03:18 PM 1.4* 0.4-1.2 mg/dL Final  03/09/2010 03:12 PM 1.0 0.4-1.2 mg/dL Final  03/02/2010 12:16 PM 1.4* 0.4-1.2 mg/dL Final  03/01/2010 02:37 PM 1.3* 0.4-1.2 mg/dL Final  12/28/2009 10:30 AM 1.1 0.4-1.2 mg/dL Final  11/14/2009 02:28 PM 1.51* 0.40 - 1.20 mg/dL Final  11/14/2009 1.51 mg/dL   10/13/2009 01:08 PM 1.30* 0.40 - 1.20 mg/dL Final  09/19/2009 06:12 AM 1.01 0.4 - 1.2 mg/dL Final  09/17/2009 06:20 AM 0.79 0.4 - 1.2 mg/dL Final    Recent Labs Lab 06/19/16 0030 06/19/16 0501  06/20/16 0035  06/22/16 0312  06/22/16 1605 06/23/16  0006 06/23/16 0405 06/23/16 0801 06/23/16 1603 06/24/16 06/24/16 0727 06/25/16 0322  NA  --  140  --  145  --  141  --   --   --  138  --   --  142  --  140  K 2.8* 3.5  --  4.5  --  4.6  --   --   --  5.3*  --   --  4.3  --  4.3  CL  --  97*  --  102  --  97*  --   --   --  93*  --   --  97*  --  101  CO2  --  34*  --  35*  --  39*  --   --   --  31  --   --  34*  --  29  GLUCOSE  --  309*  --  209*  --  176*  --   --   --  354*  --   --  118*  --  202*  BUN  --  68*  --  80*  --  102*  --   --   --  117*  --   --  121*  --  108*  CREATININE  --  2.67*  --  2.83*  --  2.94*  --   --   --  3.42*  --   --  3.60*  --  3.79*  CALCIUM  --  9.3  --  9.7  --  9.0  --   --   --  8.8*  --   --  8.0*  --  7.8*  PHOS  --  2.2*  < > 2.0*  < >  --   < > 3.0 4.4  --  5.8* 5.9* 7.0* 7.0* 6.8*  < > = values in this interval not displayed.  Recent Labs Lab 06/20/16 0035 06/22/16 0312  WBC 11.2* 8.5  HGB 10.1* 9.6*  HCT 31.6* 31.7*   MCV 91.3 96.1  PLT 242 281   Liver Function Tests:  Recent Labs Lab 06/25/16 0322  ALBUMIN 1.7*   No results for input(s): LIPASE, AMYLASE in the last 168 hours. No results for input(s): AMMONIA in the last 168 hours. Cardiac Enzymes: No results for input(s): CKTOTAL, CKMB, CKMBINDEX, TROPONINI in the last 168 hours. Iron Studies: No results for input(s): IRON, TIBC, TRANSFERRIN, FERRITIN in the last 72 hours. PT/INR: _0 (inr:5)  Xrays/Other Studies: ) Results for orders placed or performed during the hospital encounter of 06/13/16 (from the past 48 hour(s))  Magnesium     Status: None   Collection Time: 06/23/16  4:03 PM  Result Value Ref Range   Magnesium 2.1 1.7 - 2.4 mg/dL  Phosphorus     Status: Abnormal   Collection Time: 06/23/16  4:03 PM  Result Value Ref Range   Phosphorus 5.9 (H) 2.5 - 4.6 mg/dL  Glucose, capillary     Status: Abnormal   Collection Time: 06/23/16  5:37 PM  Result Value Ref Range   Glucose-Capillary 27 (LL) 65 - 99 mg/dL   Comment 1 Notify RN   Glucose, capillary     Status: Abnormal   Collection Time: 06/23/16  6:05 PM  Result Value Ref Range   Glucose-Capillary 127 (H) 65 - 99 mg/dL  Glucose, capillary     Status: Abnormal   Collection Time: 06/23/16  8:55 PM  Result Value Ref Range  Glucose-Capillary 170 (H) 65 - 99 mg/dL  Magnesium     Status: None   Collection Time: 06/24/16 12:00 AM  Result Value Ref Range   Magnesium 2.1 1.7 - 2.4 mg/dL  Phosphorus     Status: Abnormal   Collection Time: 06/24/16 12:00 AM  Result Value Ref Range   Phosphorus 7.0 (H) 2.5 - 4.6 mg/dL  Basic metabolic panel     Status: Abnormal   Collection Time: 06/24/16 12:00 AM  Result Value Ref Range   Sodium 142 135 - 145 mmol/L   Potassium 4.3 3.5 - 5.1 mmol/L    Comment: DELTA CHECK NOTED   Chloride 97 (L) 101 - 111 mmol/L   CO2 34 (H) 22 - 32 mmol/L   Glucose, Bld 118 (H) 65 - 99 mg/dL   BUN 121 (H) 6 - 20 mg/dL   Creatinine, Ser 3.60 (H) 0.44  - 1.00 mg/dL   Calcium 8.0 (L) 8.9 - 10.3 mg/dL   GFR calc non Af Amer 12 (L) >60 mL/min   GFR calc Af Amer 14 (L) >60 mL/min    Comment: (NOTE) The eGFR has been calculated using the CKD EPI equation. This calculation has not been validated in all clinical situations. eGFR's persistently <60 mL/min signify possible Chronic Kidney Disease.    Anion gap 11 5 - 15  Glucose, capillary     Status: None   Collection Time: 06/24/16  5:50 AM  Result Value Ref Range   Glucose-Capillary 79 65 - 99 mg/dL  Glucose, capillary     Status: Abnormal   Collection Time: 06/24/16  6:50 AM  Result Value Ref Range   Glucose-Capillary 153 (H) 65 - 99 mg/dL  Magnesium     Status: None   Collection Time: 06/24/16  7:27 AM  Result Value Ref Range   Magnesium 2.0 1.7 - 2.4 mg/dL  Phosphorus     Status: Abnormal   Collection Time: 06/24/16  7:27 AM  Result Value Ref Range   Phosphorus 7.0 (H) 2.5 - 4.6 mg/dL  Glucose, capillary     Status: Abnormal   Collection Time: 06/24/16 12:04 PM  Result Value Ref Range   Glucose-Capillary 329 (H) 65 - 99 mg/dL   Comment 1 Notify RN   Glucose, capillary     Status: Abnormal   Collection Time: 06/24/16  4:18 PM  Result Value Ref Range   Glucose-Capillary 229 (H) 65 - 99 mg/dL  Glucose, capillary     Status: None   Collection Time: 06/24/16  9:19 PM  Result Value Ref Range   Glucose-Capillary 77 65 - 99 mg/dL  Renal function panel     Status: Abnormal   Collection Time: 06/25/16  3:22 AM  Result Value Ref Range   Sodium 140 135 - 145 mmol/L   Potassium 4.3 3.5 - 5.1 mmol/L   Chloride 101 101 - 111 mmol/L   CO2 29 22 - 32 mmol/L   Glucose, Bld 202 (H) 65 - 99 mg/dL   BUN 108 (H) 6 - 20 mg/dL   Creatinine, Ser 3.79 (H) 0.44 - 1.00 mg/dL   Calcium 7.8 (L) 8.9 - 10.3 mg/dL   Phosphorus 6.8 (H) 2.5 - 4.6 mg/dL   Albumin 1.7 (L) 3.5 - 5.0 g/dL   GFR calc non Af Amer 11 (L) >60 mL/min   GFR calc Af Amer 13 (L) >60 mL/min    Comment: (NOTE) The eGFR has been  calculated using the CKD EPI equation. This calculation has not  been validated in all clinical situations. eGFR's persistently <60 mL/min signify possible Chronic Kidney Disease.    Anion gap 10 5 - 15  Glucose, capillary     Status: Abnormal   Collection Time: 06/25/16  6:02 AM  Result Value Ref Range   Glucose-Capillary 279 (H) 65 - 99 mg/dL  Glucose, capillary     Status: Abnormal   Collection Time: 06/25/16 11:39 AM  Result Value Ref Range   Glucose-Capillary 151 (H) 65 - 99 mg/dL   Comment 1 Notify RN    Comment 2 Document in Chart    No results found.  PMH:   Past Medical History  Diagnosis Date  . DIABETES MELLITUS, II, COMPLICATIONS 2/87/6811  . HYPERLIPIDEMIA 02/27/2007  . 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 ANEMIA, OTHER, UNSPECIFIED 02/27/2007  . DEPRESSIVE DISORDER, NOS 02/27/2007  . HYPERTENSION, BENIGN SYSTEMIC 02/27/2007  . CORONARY, ARTERIOSCLEROSIS 02/27/2007  . TIA 05/17/2009  . GASTROESOPHAGEAL REFLUX, NO ESOPHAGITIS 02/27/2007  . RENAL INSUFFICIENCY, CHRONIC 12/28/2009  . Renal failure, acute on chronic (HCC) 01/25/2014  . Blood transfusion without reported diagnosis   . Anemia   . Cellulitis and abscess of unspecified site 05/02/2010    Qualifier: Diagnosis of  By: Deborra Medina MD, Talia      PSH:   Past Surgical History  Procedure Laterality Date  . Partial hysterectomy    . Rectocele repair    . Bladder surgery      Tack  . Hip arthroplasty Right 06/07/2016    Procedure: ARTHROPLASTY BIPOLAR HIP (HEMIARTHROPLASTY);  Surgeon: Altamese Inyo, MD;  Location: [place];  Service: Orthopedics;  Laterality: Right;    Allergies:  Allergies  Allergen Reactions  . Codeine Other (See Comments)    Noted as an allergy on MAR  . Hydralazine Hcl Swelling  . Bisphosphonates Rash  . Codeine Phosphate Nausea And Vomiting and Rash  . Penicillins Itching, Rash and Other (See Comments)    Has patient had a PCN reaction causing immediate rash, facial/tongue/throat swelling, SOB or  lightheadedness with hypotension: No Has patient had a PCN reaction causing severe rash involving mucus membranes or skin necrosis: No Has patient had a PCN reaction that required hospitalization No Has patient had a PCN reaction occurring within the last 10 years: No If all of the above answers are "NO", then may proceed with Cephalosporin use.  Pt tolerant to cephalosporins   . Sulfa Antibiotics Itching and Rash    Medications:   Prior to Admission medications   Medication Sig Start Date End Date Taking? Authorizing Provider  acetaminophen (TYLENOL) 325 MG tablet Take 2 tablets (650 mg total) by mouth every 6 (six) hours as needed for mild pain (or Fever >/= 101). 06/08/16  Yes Ainsley Spinner, PA-C  ALPRAZolam Duanne Moron) 0.5 MG tablet Take 1 tablet (0.5 mg total) by mouth 3 (three) times daily as needed for anxiety. 06/11/16  Yes Clanford Marisa Hua, MD  amLODipine (NORVASC) 5 MG tablet Take 1 tablet (5 mg total) by mouth daily. 06/11/16  Yes Clanford Marisa Hua, MD  carvedilol (COREG) 3.125 MG tablet Take 1 tablet (3.125 mg total) by mouth 2 (two) times daily with a meal. 02/10/16  Yes Sital Mody, MD  Cholecalciferol (VITAMIN D3) 5000 units TABS Take 5,000 Units by mouth daily.   Yes Historical Provider, MD  Choline Fenofibrate (FENOFIBRIC ACID) 135 MG CPDR Take 135 mg by mouth at bedtime.    Yes Historical Provider, MD  clopidogrel (PLAVIX) 75 MG tablet Take 75 mg by mouth  at bedtime.    Yes Historical Provider, MD  diphenoxylate-atropine (LOMOTIL) 2.5-0.025 MG/5ML liquid Take 10 mLs by mouth 4 (four) times daily as needed for diarrhea or loose stools. 06/11/16  Yes Clanford Marisa Hua, MD  enoxaparin (LOVENOX) 30 MG/0.3ML injection Inject 0.3 mLs (30 mg total) into the skin daily. 06/08/16  Yes Ainsley Spinner, PA-C  FLUoxetine (PROZAC) 20 MG capsule Take 20 mg by mouth daily.    Yes Historical Provider, MD  gabapentin (NEURONTIN) 300 MG capsule Take 600 mg by mouth 3 (three) times daily.    Yes Historical  Provider, MD  insulin lispro (HUMALOG) 100 UNIT/ML injection Inject 3 Units into the skin 3 (three) times daily with meals. Patient uses as needed sliding scale. Hold if check blood glucose < 150.   Yes Historical Provider, MD  isosorbide mononitrate (IMDUR) 60 MG 24 hr tablet Take 1 tablet (60 mg total) by mouth daily. 02/10/16  Yes Bettey Costa, MD  lip balm (BLISTEX) OINT Apply 1 application topically as needed for lip care. 06/11/16  Yes Clanford Marisa Hua, MD  lisinopril (PRINIVIL,ZESTRIL) 5 MG tablet Take 1 tablet (5 mg total) by mouth at bedtime. 06/11/16  Yes Clanford Marisa Hua, MD  nitroGLYCERIN (NITROSTAT) 0.4 MG SL tablet Place 0.4 mg under the tongue every 5 (five) minutes as needed for chest pain. Reported on 06/13/2016   Yes Historical Provider, MD  pantoprazole (PROTONIX) 40 MG tablet Take 40 mg by mouth daily.   Yes Historical Provider, MD  simvastatin (ZOCOR) 20 MG tablet Take 20 mg by mouth at bedtime.    Yes Historical Provider, MD  topiramate (TOPAMAX) 100 MG tablet Take 100 mg by mouth 2 (two) times daily.    Yes Historical Provider, MD  feeding supplement, ENSURE ENLIVE, (ENSURE ENLIVE) LIQD Take 237 mLs by mouth 2 (two) times daily between meals. 06/11/16   Clanford Marisa Hua, MD  Glucagon, rDNA, (GLUCAGON EMERGENCY IJ) Inject 1 mg as directed once as needed (for severe hypoglycemia). Reported on 06/13/2016    Historical Provider, MD    Discontinued Meds:   Medications Discontinued During This Encounter  Medication Reason  . aztreonam (AZACTAM) 2 g in dextrose 5 % 50 mL IVPB   . levofloxacin (LEVAQUIN) IVPB 750 mg   . vancomycin (VANCOCIN) IVPB 1000 mg/200 mL premix   . norepinephrine (LEVOPHED) injection   . insulin regular (NOVOLIN R,HUMULIN R) 250 Units in sodium chloride 0.9 % 250 mL (1 Units/mL) infusion Change in therapy  . 0.9 %  sodium chloride infusion Change in therapy  . dextrose 5 %-0.45 % sodium chloride infusion Change in therapy  . heparin injection 5,000 Units  Change in therapy  . HYDROmorphone (DILAUDID) injection 0.5 mg   . norepinephrine (LEVOPHED) 4 mg in dextrose 5 % 250 mL (0.016 mg/mL) infusion   . antiseptic oral rinse (CPC / CETYLPYRIDINIUM CHLORIDE 0.05%) solution 7 mL   . furosemide (LASIX) 250 mg in dextrose 5 % 250 mL (1 mg/mL) infusion   . vancomycin (VANCOCIN) 500 mg in sodium chloride 0.9 % 100 mL IVPB Dose change  . cefTAZidime (FORTAZ) 2 g in dextrose 5 % 50 mL IVPB   . furosemide (LASIX) 250 mg in dextrose 5 % 250 mL (1 mg/mL) infusion   . furosemide (LASIX) 250 mg in dextrose 5 % 250 mL (1 mg/mL) infusion   . potassium chloride 10 mEq in 100 mL IVPB   . furosemide (LASIX) 250 mg in dextrose 5 % 250 mL (1 mg/mL)  infusion   . feeding supplement (ENSURE ENLIVE) (ENSURE ENLIVE) liquid 237 mL   . feeding supplement (VITAL HIGH PROTEIN) liquid 1,000 mL   . feeding supplement (PRO-STAT SUGAR FREE 64) liquid 30 mL   . pantoprazole (PROTONIX) EC tablet 40 mg   . enoxaparin (LOVENOX) injection 30 mg Duplicate  . vancomycin (VANCOCIN) IVPB 750 mg/150 ml premix   . cefTAZidime (FORTAZ) 1 g in dextrose 5 % 50 mL IVPB   . insulin glargine (LANTUS) injection 10 Units   . insulin aspart (novoLOG) injection 0-9 Units   . dextrose 10 % infusion   . insulin aspart (novoLOG) injection 3 Units   . insulin glargine (LANTUS) injection 12 Units   . gabapentin (NEURONTIN) capsule 600 mg   . insulin aspart (novoLOG) injection 0-15 Units   . feeding supplement (GLUCERNA 1.2 CAL) liquid 1,000 mL   . acetaminophen (TYLENOL) suppository 650 mg   . acetaminophen (TYLENOL) tablet 650 mg   . LORazepam (ATIVAN) injection 0.5-1 mg   . fentaNYL (SUBLIMAZE) injection 25-50 mcg   . insulin aspart (novoLOG) injection 0-15 Units   . insulin aspart (novoLOG) injection 0-5 Units   . insulin aspart (novoLOG) injection 4 Units   . insulin aspart (novoLOG) injection 6 Units   . insulin glargine (LANTUS) injection 15 Units   . insulin aspart (novoLOG) injection 3  Units   . levofloxacin (LEVAQUIN) IVPB 500 mg   . topiramate (TOPAMAX) tablet 100 mg   . pantoprazole sodium (PROTONIX) 40 mg/20 mL oral suspension 40 mg     Social History:  reports that she has never smoked. She has never used smokeless tobacco. She reports that she does not drink alcohol or use illicit drugs.  Family History:   Family History  Problem Relation Age of Onset  . Cancer Mother     unknown  . CAD Mother   . Cancer Sister     unknown cancer ?breast ca    Blood pressure 121/45, pulse 82, temperature 97.8 F (36.6 C), temperature source Oral, resp. rate 17, height _0  (1.6 m), weight 55.8 kg (123 lb 0.3 oz), SpO2 96 %. General appearance: alert and cooperative Head: Normocephalic, without obvious abnormality, atraumatic Eyes: negative Neck: no adenopathy, no carotid bruit, no JVD, supple, symmetrical, trachea midline and thyroid not enlarged, symmetric, no tenderness/mass/nodules Back: symmetric, no curvature. ROM normal. No CVA tenderness. Resp: clear to auscultation bilaterally Chest wall: no tenderness Cardio: regular rate and rhythm, S1, S2 normal, no murmur, click, rub or gallop GI: soft, non-tender; bowel sounds normal; no masses,  no organomegaly Extremities: extremities normal, atraumatic, no cyanosis or edema Pulses: 2+ and symmetric Skin: Skin color, texture, turgor normal. No rashes or lesions Lymph nodes: Cervical, supraclavicular, and axillary nodes normal. Neurologic: Grossly normal       Yeimy Brabant, Hunt Oris, MD 06/25/2016, 3:16 PM

## 2016-06-25 NOTE — Care Management Important Message (Signed)
Important Message  Patient Details  Name: [name] MRN: [id number] Date of Birth: 1951-11-28   Medicare Important Message Given:  Yes    [name] May 06/25/2016, 10:34 AM

## 2016-06-25 NOTE — Progress Notes (Signed)
Hypoglycemic Event  CBG: 62  Treatment: 15 GM carbohydrate snack  Symptoms: Shaky and drowsy  Follow-up CBG: Time:2156 CBG Result: 85  Possible Reasons for Event: Inadequate meal intake  Comments/MD notified:n/a gave patient scheduled glucerna to maintain CBG    [name], [name]

## 2016-06-25 NOTE — Progress Notes (Addendum)
Physical Therapy Treatment Patient Details Name: [name] MRN: [id number] DOB: 1951-03-30 Today's Date: 06/25/2016    History of Present Illness Pt readmitted from SNF with sepsis and respiratory failure. Pt placed on bipap. Pt with recent rt hip fx and hemiarthroplasty on 06/08/16. PMH - DM, CAD, CKD, falls, HTN    PT Comments    Patient progressing to ambulation this session and mentally more aware in terms of that she didn't have much therapy at SNF before.  Educated could have been due to her medical issues/illness.  Feel continued skilled PT in the acute setting needed to progress mobility for d/c to SNF prior to d/c home.   Follow Up Recommendations  SNF     Equipment Recommendations  None recommended by PT    Recommendations for Other Services       Precautions / Restrictions Precautions Precautions: Fall;Posterior Hip Precaution Comments: reviewed hip precautions, seems fearful to move the wrong way and though was not to move leg out to side Restrictions RLE Weight Bearing: Weight bearing as tolerated    Mobility  Bed Mobility Overal bed mobility: Needs Assistance       Supine to sit: Mod assist     General bed mobility comments: assist for trunk upright and pt pulling on railing  Transfers Overall transfer level: Needs assistance Equipment used: Rolling walker (2 wheeled) Transfers: Sit to/from Stand Sit to Stand: Mod assist Stand pivot transfers: Mod assist       General transfer comment: steps with RW to pivot to [facility]  Ambulation/Gait Ambulation/Gait assistance: Min assist Ambulation Distance (Feet): 12 Feet Assistive device: Rolling walker (2 wheeled) Gait Pattern/deviations: Step-to pattern;Decreased stride length;Wide base of support     General Gait Details: assist for balance, security and safety   Stairs            Wheelchair Mobility    Modified Rankin (Stroke Patients Only)       Balance Overall balance assessment:  Needs assistance Sitting-balance support: Feet supported;Single extremity supported Sitting balance-Leahy Scale: Poor Sitting balance - Comments: holding railing sitting EOB with feet supported   Standing balance support: Bilateral upper extremity supported Standing balance-Leahy Scale: Poor Standing balance comment: UE support needed for balance while assisted for perineal hygiene after toileting                    Cognition Arousal/Alertness: Awake/alert Behavior During Therapy: WFL for tasks assessed/performed Overall Cognitive Status: Within Functional Limits for tasks assessed                      Exercises Total Joint Exercises Ankle Circles/Pumps: AROM;10 reps;Both;Supine Quad Sets: AROM;Both;5 reps;Supine Short Arc Quad: AROM;Both;10 reps;Supine Heel Slides: AROM;AAROM;Both;10 reps;Supine Hip ABduction/ADduction: AROM;Right;10 reps;Supine    General Comments        Pertinent Vitals/Pain Faces Pain Scale: Hurts a little bit Pain Location: sore R hip with ROM Pain Descriptors / Indicators: Guarding Pain Intervention(s): Repositioned;Monitored during session    Home Living                      Prior Function            PT Goals (current goals can now be found in the care plan section) Progress towards PT goals: Goals met and updated - see care plan    Frequency  Min 2X/week    PT Plan Current plan remains appropriate    Co-evaluation  End of Session Equipment Utilized During Treatment: Gait belt;Oxygen Activity Tolerance: Patient limited by fatigue Patient left: in chair;with call bell/phone within reach     Time: 0957-1041 PT Time Calculation (min) (ACUTE ONLY): 44 min  Charges:  $Gait Training: 8-22 mins $Therapeutic Activity: 8-22 mins  $Thearpeutic Exercise: 8-22 mins                   G Codes:      Reginia Naas 07/20/2016, 11:48 AM  Magda Kiel, PT [phone] 07/20/2016

## 2016-06-26 DIAGNOSIS — Z96649 Presence of unspecified artificial hip joint: Secondary | ICD-10-CM | POA: Insufficient documentation

## 2016-06-26 DIAGNOSIS — Z9660 Presence of unspecified orthopedic joint implant: Secondary | ICD-10-CM

## 2016-06-26 DIAGNOSIS — E8809 Other disorders of plasma-protein metabolism, not elsewhere classified: Secondary | ICD-10-CM | POA: Insufficient documentation

## 2016-06-26 DIAGNOSIS — E875 Hyperkalemia: Secondary | ICD-10-CM | POA: Insufficient documentation

## 2016-06-26 DIAGNOSIS — R7309 Other abnormal glucose: Secondary | ICD-10-CM | POA: Insufficient documentation

## 2016-06-26 DIAGNOSIS — E46 Unspecified protein-calorie malnutrition: Secondary | ICD-10-CM | POA: Insufficient documentation

## 2016-06-26 DIAGNOSIS — E1149 Type 2 diabetes mellitus with other diabetic neurological complication: Secondary | ICD-10-CM

## 2016-06-26 DIAGNOSIS — N184 Chronic kidney disease, stage 4 (severe): Secondary | ICD-10-CM

## 2016-06-26 DIAGNOSIS — I10 Essential (primary) hypertension: Secondary | ICD-10-CM

## 2016-06-26 DIAGNOSIS — D72829 Elevated white blood cell count, unspecified: Secondary | ICD-10-CM

## 2016-06-26 DIAGNOSIS — D638 Anemia in other chronic diseases classified elsewhere: Secondary | ICD-10-CM

## 2016-06-26 LAB — RENAL FUNCTION PANEL
Albumin: 1.6 g/dL — ABNORMAL LOW (ref 3.5–5.0)
Anion gap: 10 (ref 5–15)
BUN: 103 mg/dL — ABNORMAL HIGH (ref 6–20)
CALCIUM: 8 mg/dL — AB (ref 8.9–10.3)
CHLORIDE: 101 mmol/L (ref 101–111)
CO2: 29 mmol/L (ref 22–32)
CREATININE: 3.62 mg/dL — AB (ref 0.44–1.00)
GFR calc non Af Amer: 12 mL/min — ABNORMAL LOW (ref >60)
GFR, EST AFRICAN AMERICAN: 14 mL/min — AB (ref >60)
GLUCOSE: 76 mg/dL (ref 65–99)
Phosphorus: 5.9 mg/dL — ABNORMAL HIGH (ref 2.5–4.6)
Potassium: 4.2 mmol/L (ref 3.5–5.1)
SODIUM: 140 mmol/L (ref 135–145)

## 2016-06-26 LAB — GLUCOSE, CAPILLARY
GLUCOSE-CAPILLARY: 80 mg/dL (ref 65–99)
GLUCOSE-CAPILLARY: 232 mg/dL — AB (ref 65–99)
GLUCOSE-CAPILLARY: 160 mg/dL — AB (ref 65–99)
GLUCOSE-CAPILLARY: 102 mg/dL — AB (ref 65–99)
Glucose-Capillary: 382 mg/dL — ABNORMAL HIGH (ref 65–99)
Glucose-Capillary: 87 mg/dL (ref 65–99)

## 2016-06-26 LAB — PROTEIN ELECTROPHORESIS, SERUM
A/G RATIO SPE: 0.9 (ref 0.7–1.7)
ALBUMIN ELP: 2.1 g/dL — AB (ref 2.9–4.4)
Alpha-1-Globulin: 0.3 g/dL (ref 0.0–0.4)
Alpha-2-Globulin: 0.7 g/dL (ref 0.4–1.0)
BETA GLOBULIN: 0.5 g/dL — AB (ref 0.7–1.3)
GLOBULIN, TOTAL: 2.4 g/dL (ref 2.2–3.9)
Gamma Globulin: 0.9 g/dL (ref 0.4–1.8)
M-Spike, %: 0.4 g/dL — ABNORMAL HIGH
TOTAL PROTEIN ELP: 4.5 g/dL — AB (ref 6.0–8.5)

## 2016-06-26 LAB — KAPPA/LAMBDA LIGHT CHAINS
KAPPA FREE LGHT CHN: 154.4 mg/L — AB (ref 3.3–19.4)
KAPPA, LAMDA LIGHT CHAIN RATIO: 3.71 — AB (ref 0.26–1.65)
Lambda free light chains: 41.6 mg/L — ABNORMAL HIGH (ref 5.7–26.3)

## 2016-06-26 IMAGING — CT CT HEAD W/O CM
1 series · 16 of 30 positions shown, 20 images · non-contrast
Comparison: 05/10/2010

CLINICAL DATA: Unsteady gait, dizziness this morning, history
migraines, diabetes mellitus, hyperlipidemia, benign systemic
hypertension, coronary artery disease, chronic renal insufficiency

EXAM:
CT HEAD WITHOUT CONTRAST
TECHNIQUE: Contiguous axial images were obtained from the base of the skull
through the vertex without intravenous contrast.

[Series 2: head wo · axial · 0.43mm/px · z∈[-152,-26]mm · 16 of 32 slices shown, 20 images]
[im 2/32  brain]
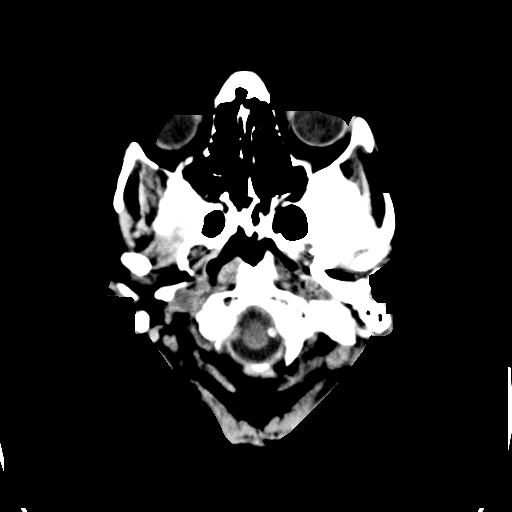
[im 2/32  bone]
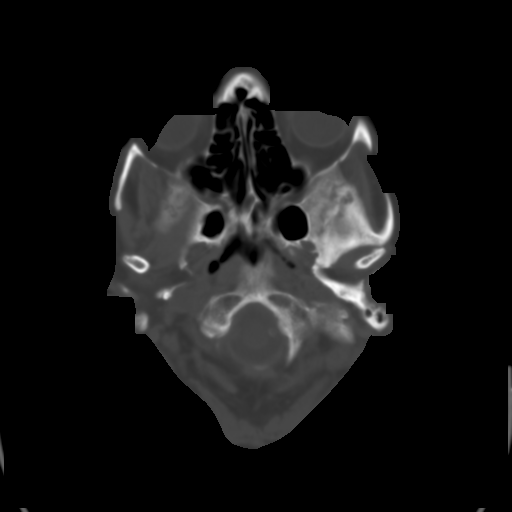
[im 4/32  brain]
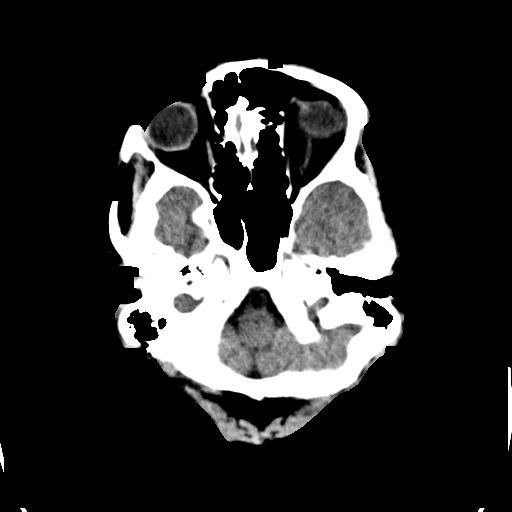
[im 6/32  brain]
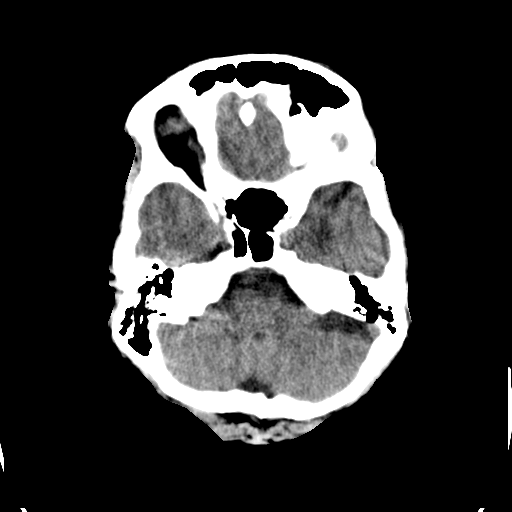
[im 8/32  brain]
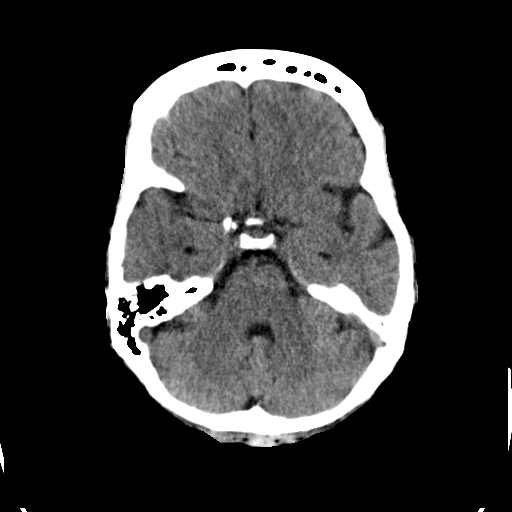
[im 9/32  brain]
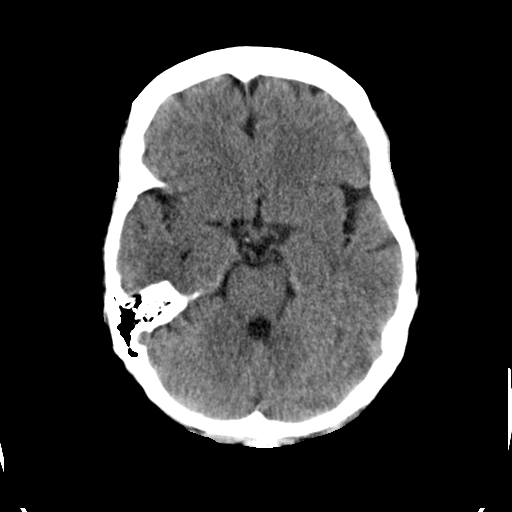
[im 9/32  bone]
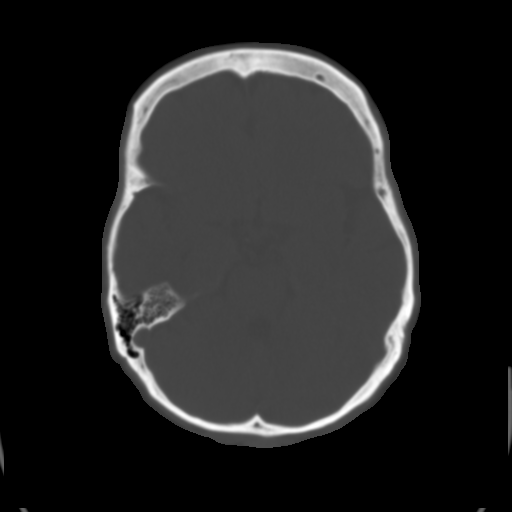
[im 11/32  brain]
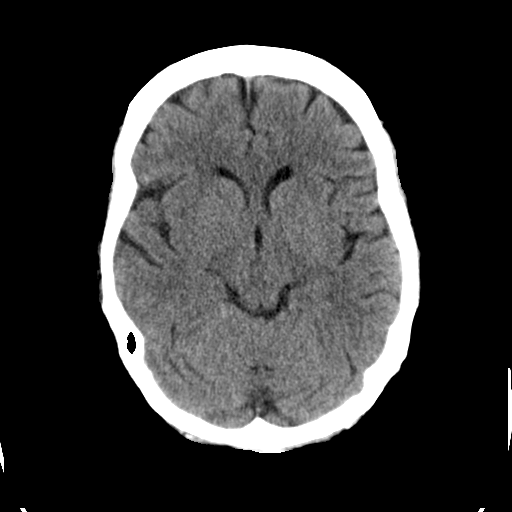
[im 13/32  brain]
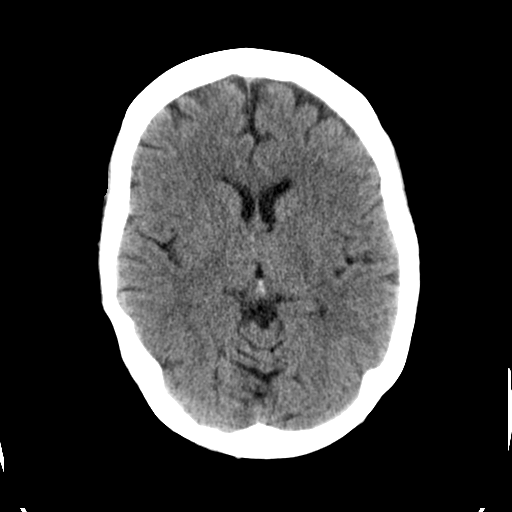
[im 15/32  brain]
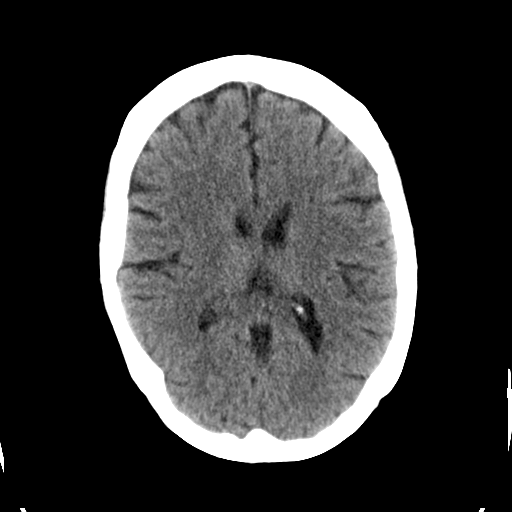
[im 17/32  brain]
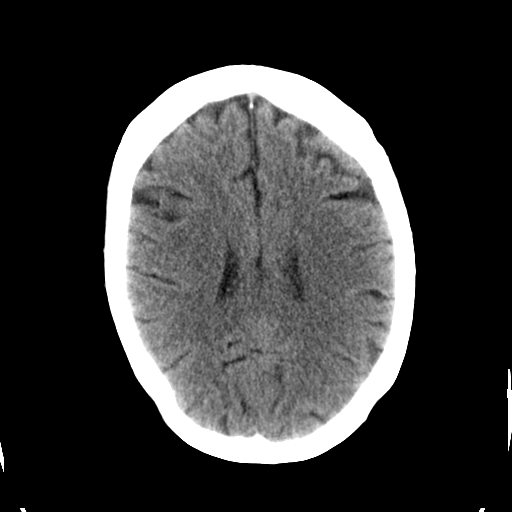
[im 17/32  bone]
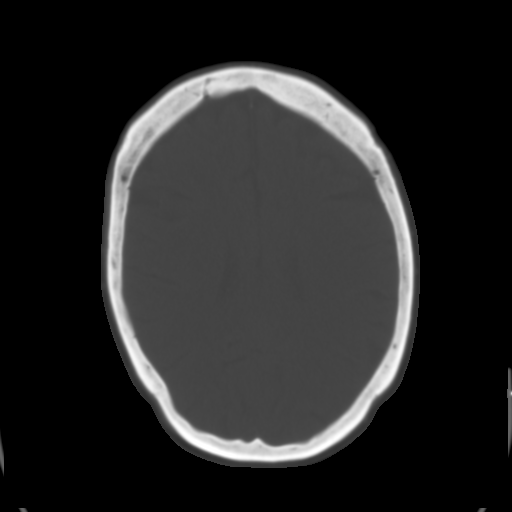
[im 19/32  brain]
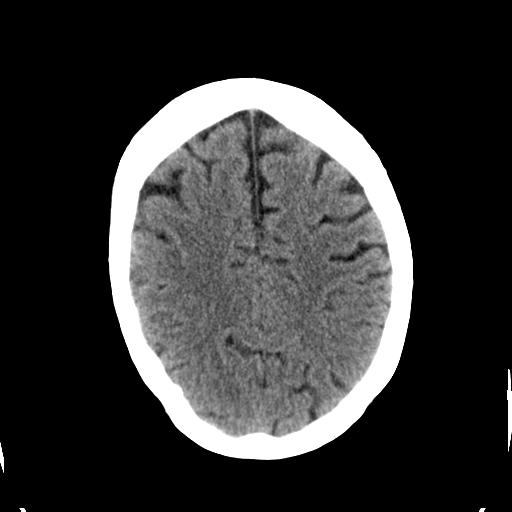
[im 21/32  brain]
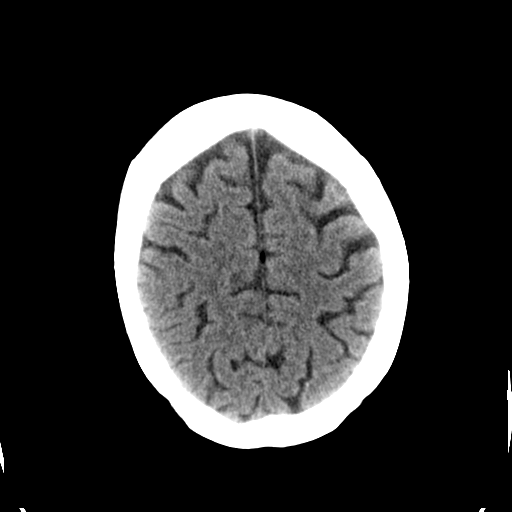
[im 23/32  brain]
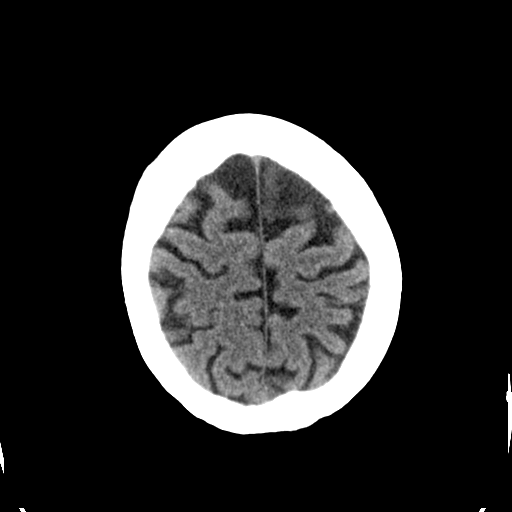
[im 24/32  brain]
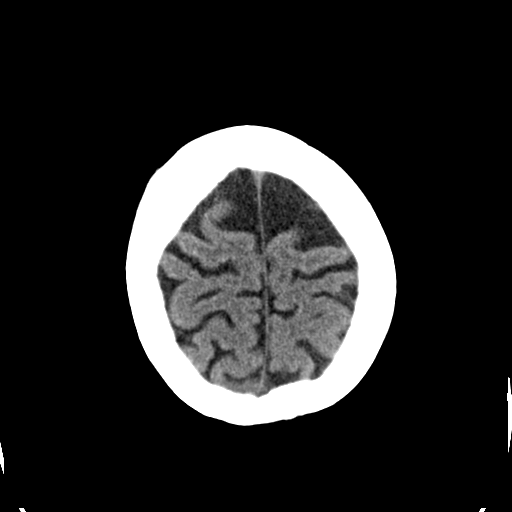
[im 24/32  bone]
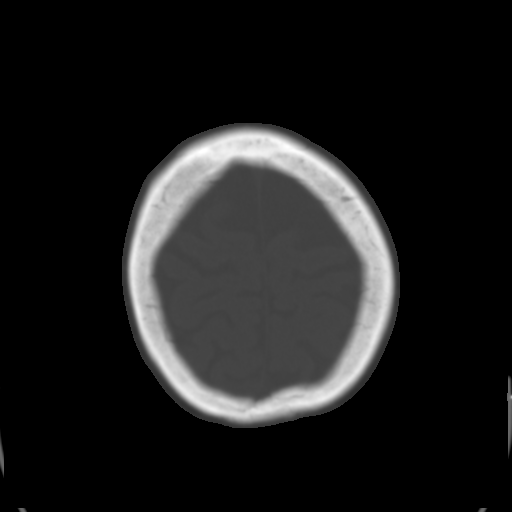
[im 26/32  brain]
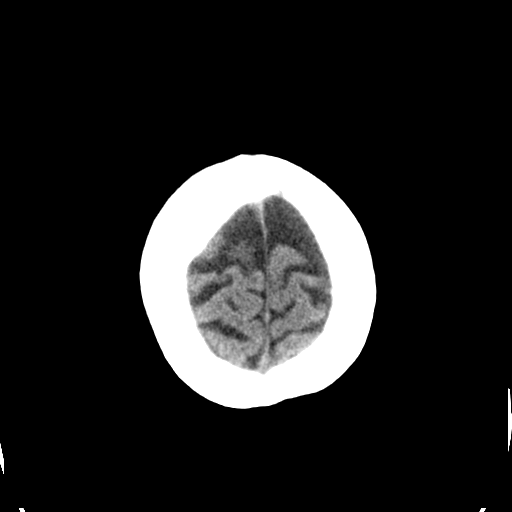
[im 28/32  brain]
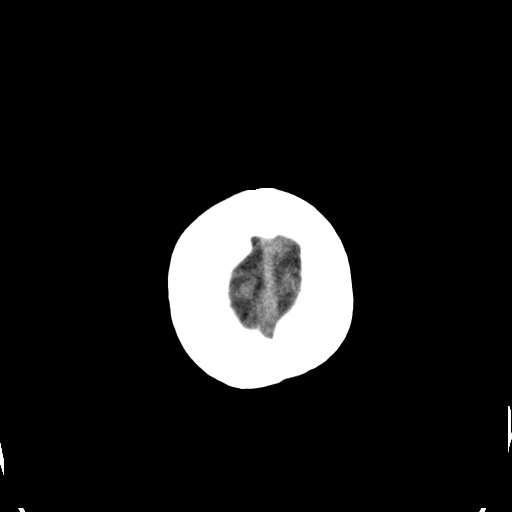
[im 30/32  brain]
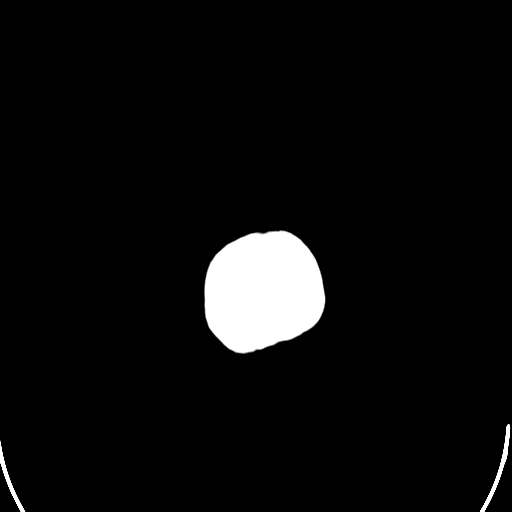

[16 of 30 positions shown; findings below may reference images not displayed]

FINDINGS: Normal ventricular morphology.

No midline shift or mass effect.

Normal appearance of brain parenchyma for age.

No intracranial hemorrhage, mass lesion, or acute infarction.

No extra-axial fluid collections.

Minimal fluid dependently in RIGHT sphenoid sinus slightly increased
versus previous exam.

Visualized paranasal sinuses and mastoid air cells otherwise clear.

Bones unremarkable.
IMPRESSION: No acute intracranial abnormalities.

Minimal fluid in RIGHT sphenoid sinus.

## 2016-06-26 MED ORDER — INSULIN GLARGINE 100 UNIT/ML ~~LOC~~ SOLN
8.0000 [IU] | Freq: Every day | SUBCUTANEOUS | Status: DC
  Administered 2016-06-27: 8 [IU] via SUBCUTANEOUS
  Administered 2016-06-29: 6 [IU] via SUBCUTANEOUS
  Administered 2016-06-30 – 2016-07-03 (×4): 8 [IU] via SUBCUTANEOUS
  Filled 2016-06-26 (×9): qty 0.08

## 2016-06-26 MED ORDER — CALCIUM ACETATE (PHOS BINDER) 667 MG PO CAPS
1334.0000 mg | ORAL_CAPSULE | Freq: Three times a day (TID) | ORAL | Status: DC
  Administered 2016-06-26 – 2016-07-04 (×20): 1334 mg via ORAL
  Filled 2016-06-26 (×22): qty 2

## 2016-06-26 MED ORDER — SODIUM CHLORIDE 0.9 % IV SOLN: INTRAVENOUS | Status: AC

## 2016-06-26 MED ORDER — PANTOPRAZOLE SODIUM 40 MG PO TBEC
40.0000 mg | DELAYED_RELEASE_TABLET | Freq: Two times a day (BID) | ORAL | Status: DC
  Administered 2016-06-26 – 2016-07-04 (×16): 40 mg via ORAL
  Filled 2016-06-26 (×16): qty 1

## 2016-06-26 NOTE — Consult Note (Signed)
Physical Medicine and Rehabilitation Consult Reason for Consult: Debilitation/sepsis/respiratory failure/right displaced femoral neck fracture Referring Physician: [facility]   HPI: [name] is a 65 y.o. right handed female with history of diabetes mellitus, coronary artery disease, chronic renal insufficiency with baseline creatinine 1.9-2.2 and recent right hip hemiarthroplasty for femoral neck fracture 06/08/2016 per Dr. Marcelino Scot and discharged to [facility]. Per chart review patient lived alone prior to hip fracture. She has a sister in the area she plans to live with on discharge if they can accommodate an apartment together. Her sister is disables as well.  Present 06/13/2016 from skilled nursing facility with complaints of altered mental status and hypotension. Chest x-ray with new lower lung volumes left greater than right worrisome for possible aspiration. Urine culture no growth. Troponin 9.95. Suspect sepsis placed on broad-spectrum antibiotics. Blood cultures negative. Elevated troponin felt to be secondary to demand ischemia. Creatinine elevated to 3.79 renal service is consulted placed on gentle IV fluids. Renal ultrasound no hydronephrosis. Initially with nasogastric tube feeds for nutritional support by slowly advance. Mental status continued to improve. Physical therapy evaluation completed an ongoing patient weightbearing as tolerated right lower extremity. Subcutaneous heparin for DVT prophylaxis. M.D. has requested physical medicine rehabilitation consult.   Review of Systems  Constitutional: Negative for fever and chills.  HENT: Negative for hearing loss.   Eyes: Negative for blurred vision and double vision.  Respiratory: Positive for shortness of breath. Negative for cough.   Cardiovascular: Negative for chest pain.  Gastrointestinal: Positive for constipation. Negative for nausea and vomiting.  Genitourinary: Negative for dysuria and hematuria.  Musculoskeletal:  Positive for myalgias, back pain, joint pain, falls and neck pain.  Skin: Negative for rash.  Neurological: Positive for weakness and headaches. Negative for loss of consciousness.  Psychiatric/Behavioral:       Anxiety  All other systems reviewed and are negative.  Past Medical History  Diagnosis Date  . DIABETES MELLITUS, II, COMPLICATIONS 99991111  . HYPERLIPIDEMIA 02/27/2007  . 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 ANEMIA, OTHER, UNSPECIFIED 02/27/2007  . DEPRESSIVE DISORDER, NOS 02/27/2007  . HYPERTENSION, BENIGN SYSTEMIC 02/27/2007  . CORONARY, ARTERIOSCLEROSIS 02/27/2007  . TIA 05/17/2009  . GASTROESOPHAGEAL REFLUX, NO ESOPHAGITIS 02/27/2007  . RENAL INSUFFICIENCY, CHRONIC 12/28/2009  . Renal failure, acute on chronic (HCC) 01/25/2014  . Blood transfusion without reported diagnosis   . Anemia   . Cellulitis and abscess of unspecified site 05/02/2010    Qualifier: Diagnosis of  By: Deborra Medina MD, Talia     Past Surgical History  Procedure Laterality Date  . Partial hysterectomy    . Rectocele repair    . Bladder surgery      Tack  . Hip arthroplasty Right 06/07/2016    Procedure: ARTHROPLASTY BIPOLAR HIP (HEMIARTHROPLASTY);  Surgeon: Altamese Littleton, MD;  Location: [facility];  Service: Orthopedics;  Laterality: Right;   Family History  Problem Relation Age of Onset  . Cancer Mother     unknown  . CAD Mother   . Cancer Sister     unknown cancer ?breast ca   Social History:  reports that she has never smoked. She has never used smokeless tobacco. She reports that she does not drink alcohol or use illicit drugs. Allergies:  Allergies  Allergen Reactions  . Codeine Other (See Comments)    Noted as an allergy on MAR  . Hydralazine Hcl Swelling  . Bisphosphonates Rash  . Codeine Phosphate Nausea And Vomiting and Rash  . Penicillins Itching,  Rash and Other (See Comments)    Has patient had a PCN reaction causing immediate rash, facial/tongue/throat swelling, SOB or lightheadedness with  hypotension: No Has patient had a PCN reaction causing severe rash involving mucus membranes or skin necrosis: No Has patient had a PCN reaction that required hospitalization No Has patient had a PCN reaction occurring within the last 10 years: No If all of the above answers are "NO", then may proceed with Cephalosporin use.  Pt tolerant to cephalosporins   . Sulfa Antibiotics Itching and Rash   Medications Prior to Admission  Medication Sig Dispense Refill  . acetaminophen (TYLENOL) 325 MG tablet Take 2 tablets (650 mg total) by mouth every 6 (six) hours as needed for mild pain (or Fever >/= 101).    Marland Kitchen ALPRAZolam (XANAX) 0.5 MG tablet Take 1 tablet (0.5 mg total) by mouth 3 (three) times daily as needed for anxiety. 10 tablet 0  . amLODipine (NORVASC) 5 MG tablet Take 1 tablet (5 mg total) by mouth daily. 30 tablet 0  . carvedilol (COREG) 3.125 MG tablet Take 1 tablet (3.125 mg total) by mouth 2 (two) times daily with a meal. 60 tablet 0  . Cholecalciferol (VITAMIN D3) 5000 units TABS Take 5,000 Units by mouth daily.    . Choline Fenofibrate (FENOFIBRIC ACID) 135 MG CPDR Take 135 mg by mouth at bedtime.     . clopidogrel (PLAVIX) 75 MG tablet Take 75 mg by mouth at bedtime.     . diphenoxylate-atropine (LOMOTIL) 2.5-0.025 MG/5ML liquid Take 10 mLs by mouth 4 (four) times daily as needed for diarrhea or loose stools. 30 mL 0  . enoxaparin (LOVENOX) 30 MG/0.3ML injection Inject 0.3 mLs (30 mg total) into the skin daily. 14 Syringe 0  . FLUoxetine (PROZAC) 20 MG capsule Take 20 mg by mouth daily.     Marland Kitchen gabapentin (NEURONTIN) 300 MG capsule Take 600 mg by mouth 3 (three) times daily.     . insulin lispro (HUMALOG) 100 UNIT/ML injection Inject 3 Units into the skin 3 (three) times daily with meals. Patient uses as needed sliding scale. Hold if check blood glucose < 150.    . isosorbide mononitrate (IMDUR) 60 MG 24 hr tablet Take 1 tablet (60 mg total) by mouth daily. 30 tablet 0  . lip balm  (BLISTEX) OINT Apply 1 application topically as needed for lip care. 1 Tube 0  . lisinopril (PRINIVIL,ZESTRIL) 5 MG tablet Take 1 tablet (5 mg total) by mouth at bedtime. 30 tablet 0  . nitroGLYCERIN (NITROSTAT) 0.4 MG SL tablet Place 0.4 mg under the tongue every 5 (five) minutes as needed for chest pain. Reported on 06/13/2016    . pantoprazole (PROTONIX) 40 MG tablet Take 40 mg by mouth daily.    . simvastatin (ZOCOR) 20 MG tablet Take 20 mg by mouth at bedtime.     . topiramate (TOPAMAX) 100 MG tablet Take 100 mg by mouth 2 (two) times daily.     . feeding supplement, ENSURE ENLIVE, (ENSURE ENLIVE) LIQD Take 237 mLs by mouth 2 (two) times daily between meals. 237 mL 0  . Glucagon, rDNA, (GLUCAGON EMERGENCY IJ) Inject 1 mg as directed once as needed (for severe hypoglycemia). Reported on 06/13/2016      Home: Home Living Family/patient expects to be discharged to:: Skilled nursing facility  Functional History: Prior Function Level of Independence: Needs assistance Gait / Transfers Assistance Needed: Amb short distances with walker and assist on 6/12 after hip fx. Prior  to hip fx pt was independent.  Functional Status:  Mobility: Bed Mobility Overal bed mobility: Needs Assistance Bed Mobility: Supine to Sit Supine to sit: Mod assist Sit to supine: +2 for physical assistance, Total assist General bed mobility comments: assist for trunk upright and pt pulling on railing Transfers Overall transfer level: Needs assistance Equipment used: Rolling walker (2 wheeled) Transfers: Sit to/from Stand Sit to Stand: Mod assist Stand pivot transfers: Mod assist General transfer comment: steps with RW to pivot to [facility] Ambulation/Gait Ambulation/Gait assistance: Min assist Ambulation Distance (Feet): 12 Feet Assistive device: Rolling walker (2 wheeled) Gait Pattern/deviations: Step-to pattern, Decreased stride length, Wide base of support General Gait Details: assist for balance, security and  safety    ADL:    Cognition: Cognition Overall Cognitive Status: Within Functional Limits for tasks assessed Orientation Level: Oriented X4 Cognition Arousal/Alertness: Awake/alert Behavior During Therapy: WFL for tasks assessed/performed Overall Cognitive Status: Within Functional Limits for tasks assessed Area of Impairment: Orientation, Memory Orientation Level: Place, Situation, Time Current Attention Level: Sustained Memory: Decreased recall of precautions, Decreased short-term memory Following Commands: Follows one step commands with increased time Difficult to assess due to: Level of arousal  Blood pressure 130/55, pulse 90, temperature 98.5 F (36.9 C), temperature source Oral, resp. rate 20, height 5\' 3"  (1.6 m), weight 55.8 kg (123 lb 0.3 oz), SpO2 96 %. Physical Exam  Vitals reviewed. Constitutional: She appears well-developed.  66 year old female appearing her stated age Frail  HENT:  Head: Normocephalic and atraumatic.  Eyes: Conjunctivae and EOM are normal.  Neck: Normal range of motion. Neck supple. No thyromegaly present.  Cardiovascular: Normal rate and regular rhythm.   Respiratory: Effort normal and breath sounds normal.  GI: Soft. Bowel sounds are normal. She exhibits no distension.  Musculoskeletal: She exhibits tenderness. She exhibits no edema.  Neurological: She is alert.  Patient is very anxious and tearful.  Follows simple commands.  She is able to provide her name and age date of birth in place. Motor: B/l UE 4/5 proximal to distal B/l LE hip flexion, knee extension 4/5, ankle dorsi/plantar flexion 4+/5  Skin: Skin is warm and dry.  Psychiatric: Her speech is normal. She is slowed.  Flat affect    Results for orders placed or performed during the hospital encounter of 06/13/16 (from the past 24 hour(s))  Glucose, capillary     Status: Abnormal   Collection Time: 06/25/16 11:39 AM  Result Value Ref Range   Glucose-Capillary 151 (H) 65 - 99  mg/dL   Comment 1 Notify RN    Comment 2 Document in Chart   Glucose, capillary     Status: Abnormal   Collection Time: 06/25/16  4:18 PM  Result Value Ref Range   Glucose-Capillary 229 (H) 65 - 99 mg/dL   Comment 1 Notify RN    Comment 2 Document in Chart   Glucose, capillary     Status: Abnormal   Collection Time: 06/25/16  9:02 PM  Result Value Ref Range   Glucose-Capillary 62 (L) 65 - 99 mg/dL  Glucose, capillary     Status: None   Collection Time: 06/25/16  9:56 PM  Result Value Ref Range   Glucose-Capillary 85 65 - 99 mg/dL  Glucose, capillary     Status: None   Collection Time: 06/26/16 12:52 AM  Result Value Ref Range   Glucose-Capillary 87 65 - 99 mg/dL  Renal function panel     Status: Abnormal   Collection Time: 06/26/16  2:28  AM  Result Value Ref Range   Sodium 140 135 - 145 mmol/L   Potassium 4.2 3.5 - 5.1 mmol/L   Chloride 101 101 - 111 mmol/L   CO2 29 22 - 32 mmol/L   Glucose, Bld 76 65 - 99 mg/dL   BUN 103 (H) 6 - 20 mg/dL   Creatinine, Ser 3.62 (H) 0.44 - 1.00 mg/dL   Calcium 8.0 (L) 8.9 - 10.3 mg/dL   Phosphorus 5.9 (H) 2.5 - 4.6 mg/dL   Albumin 1.6 (L) 3.5 - 5.0 g/dL   GFR calc non Af Amer 12 (L) >60 mL/min   GFR calc Af Amer 14 (L) >60 mL/min   Anion gap 10 5 - 15  Glucose, capillary     Status: None   Collection Time: 06/26/16  6:11 AM  Result Value Ref Range   Glucose-Capillary 80 65 - 99 mg/dL  Glucose, capillary     Status: Abnormal   Collection Time: 06/26/16 11:13 AM  Result Value Ref Range   Glucose-Capillary 232 (H) 65 - 99 mg/dL   US Renal  06/25/2016  CLINICAL DATA:  65 year old diabetic hypertensive female with stage 3 kidney disease. Subsequent encounter. EXAM: RENAL / URINARY TRACT ULTRASOUND COMPLETE COMPARISON:  06/13/2016 ultrasound.  03/11/2016 CT. FINDINGS: Right Kidney: Length: 10.6 cm. Echogenicity within normal limits. No mass or hydronephrosis visualized. Left Kidney: Length: 10.7 cm. Echogenicity within normal limits. No mass  or hydronephrosis visualized. Bladder: Appears normal for degree of bladder distention. IMPRESSION: No hydronephrosis. Limited evaluation of bladder secondary to decompression. Right-sided pleural effusion suspected. Electronically Signed   By: Genia Del M.D.   On: 06/25/2016 17:13    Assessment/Plan: Diagnosis: Debilitation Labs and images independently reviewed.  Records reviewed and summated above.  1. Does the need for close, 24 hr/day medical supervision in concert with the patient's rehab needs make it unreasonable for this patient to be served in a less intensive setting? Yes 2. Co-Morbidities requiring supervision/potential complications: CKD (avoid nephrotoxic meds), diabetes mellitus (Monitor in accordance with exercise and adjust meds as necessary), coronary artery disease (cont meds), chronic renal insufficiency (avoid nephrotoxic meds), recent right hip hemiarthroplasty for femoral neck fracture, labile blood glucose, hypoalbuminemia (replete), anemia of chronic disease (transfuse if necessary to ensure appropriate perfusion for increased activity tolerance), leukocytosis (cont to monitor for signs and symptoms of infection, further workup if indicated), hyperkalemia (cont to monitor treat if necessary) 3. Due to safety, skin/wound care, disease management, medication administration, pain management and patient education, does the patient require 24 hr/day rehab nursing? Yes 4. Does the patient require coordinated care of a physician, rehab nurse, PT (1-2 hrs/day, 5 days/week) and OT (1-2 hrs/day, 5 days/week) to address physical and functional deficits in the context of the above medical diagnosis(es)? Yes Addressing deficits in the following areas: balance, endurance, locomotion, strength, transferring, toileting and psychosocial support 5. Can the patient actively participate in an intensive therapy program of at least 3 hrs of therapy per day at least 5 days per week? Yes 6. The  potential for patient to make measurable gains while on inpatient rehab is good and fair 7. Anticipated functional outcomes upon discharge from inpatient rehab are supervision and min assist  with PT, supervision and min assist with OT, n/a with SLP. 8. Estimated rehab length of stay to reach the above functional goals is: 10-14 days. 9. Does the patient have adequate social supports and living environment to accommodate these discharge functional goals? No 10. Anticipated D/C setting: Other  11. Anticipated post D/C treatments: N/A 12. Overall Rehab/Functional Prognosis: good and fair  RECOMMENDATIONS: This patient's condition is appropriate for continued rehabilitative care in the following setting: Pt disabled, staying at SNF prior to admission. She has a sister who is also disabled. Pt will unlikely be able to obtain an independent level of functioning after a short IRF stay.  Agree with therapies, recommend SNF.  Patient has agreed to participate in recommended program. Potentially Note that insurance prior authorization may be required for reimbursement for recommended care.  Comment: Rehab Admissions Coordinator to follow up.  Delice Lesch, MD 06/26/2016

## 2016-06-26 NOTE — Progress Notes (Signed)
Utilization review completed.  

## 2016-06-26 NOTE — Progress Notes (Signed)
Patient refusing BIPAP for tonight.

## 2016-06-26 NOTE — Progress Notes (Signed)
Speech Language Pathology Treatment: Dysphagia  Patient Details Name: [name] MRN: [id number] DOB: [birth year] Today's Date: 06/26/2016 Time: [id number] SLP Time Calculation (min) (ACUTE ONLY): 16 min  Assessment / Plan / Recommendation Clinical Impression  Pt consumed regular textures with adequate oral preparation and seemingly swift swallow. She did have immediate cough x1 following straw sip of thin liquid, which she attributes to not tucking her chin. She did not have any other coughing observed on additional trials of water, despite lack of full chin tuck. Pt seems anxious about her food options and wavers between worrying about eating too much and not wanting to eat what is delivered on her trays. Recommend to liberalize diet to allow for more options. Will order regular textures and thin liquids. Continue strict aspiration precautions. SLP to f/u an additional time for tolerance, particularly given coughing observed today.   HPI HPI: 65 year old female with PMH as below, which includes DM, CAD, HTN, and recent admission for R femoral neck fracture s/p arthoplasty 6/8. She had suffered several fall prior to the fall that caused the fracture. Post operative course was fairly uncomplicated with the exception of some mild anemia requiring PRBC transfusion and UTI treated with levaquin. She was discharged to [facility] for rehabilitation on 6/12. 6/14 she returned to ER with chief complaint of AMS with associated hypotension. In the emergency department she was noted to be lethargic and confused. She was complaining of abdominal pain. She was hypotensive and hypothermic with elevated WBC. Glucose noted to be markedly elevated, however urine ketones negative. With metabolic acidosis. Lactic acid wnl. Has had a cortrak.       SLP Plan  Continue with current plan of care     Recommendations  Diet recommendations: Regular;Thin liquid Liquids provided via: Cup;Straw Medication  Administration: Whole meds with puree Supervision: Patient able to self feed;Intermittent supervision to cue for compensatory strategies Compensations: Slow rate;Small sips/bites Postural Changes and/or Swallow Maneuvers: Seated upright 90 degrees             Oral Care Recommendations: Oral care BID Follow up Recommendations: Skilled Nursing facility Plan: Continue with current plan of care     GO               Germain Osgood, M.A. CCC-SLP [id number]  Germain Osgood 06/26/2016, 2:47 PM

## 2016-06-26 NOTE — Progress Notes (Signed)
[facility] Progress Note   65 y.o. female with a PMHx of diabetes mellitus type 2, hyperlipidemia, MGUS, anemia chronic kidney disease, hypertension, coronary artery disease, GERD, chronic kidney disease stage IV BL Cr 1.9-2.2, depression, who was admitted to [facility] on 6/14 with hypotension and concern for sepsis.Renal function initially improved but then has had a consistent trend to a decline in function @ which time we were consulted on 6/26.  Assessment/ Plan:   1. Acute Kidney Injury on CKD IV - patient already with advanced CKD IV even with a baseline creatinine of 1.9-2.2 based on her age and body mass. Fortunately renal function appears to have started to  stabilize. - She has had a few hypotensive episodes but none sustained and did not receive contrast during this hospitalization. - Urine microscopy which I personally reviewed showed only a few pigmented casts, yeast, ~3 WBC/hpf and no RBC's. - Postvoid residual (41ml)  and renal ultrasound --> no mention of thinning of the cortex.  - SFLC + SPEP + UPC --> all pending.   - Received  51ml NS x2 yesterday with some improvement in renal function. - Likely will need a renal biopsy depending on results of the urine studies.   2. Shock resolved with sepsis and negative urine + blood cultures now off Abx (had been treated with Levaquin --> Fortaz + Vancomycin completed on 6/25) 3. CASHD - recent MI before she received a renal biopsy at [facility]. 4. Hypertension - fairly well controlled. 5. MGUS - repeated SPEP and SFLC 6. Hyperphosphatemia - binders (increased to 2 tabs w/ each meal). 7. Acute encephalopathy - primary team appropriately adjusting per impaired renal function.  Subjective:   Feeling about the same as yesterday. Has intermittent chest pain not associated with exertion. No f/c/n/v.    Objective:   BP 130/55 mmHg  Pulse 90  Temp(Src) 98.5 F (36.9 C) (Oral)  Resp 20  Ht 5\' 3"  (1.6 m)  Wt 55.8 kg (123 lb  0.3 oz)  BMI 21.80 kg/m2  SpO2 96%  Intake/Output Summary (Last 24 hours) at 06/26/16 1042 Last data filed at 06/25/16 1600  Gross per 24 hour  Intake    240 ml  Output    150 ml  Net     90 ml   Weight change:   Physical Exam: Constitutional:  Negative for fever and chills.  Respiratory: Poor air movement Cardiovascular: Positive for chest pain intermittently. Negative for orthopnea and PND.  Gastrointestinal: Negative for heartburn, nausea and vomiting.  Genitourinary: Negative for dysuria and frequency.  Musculoskeletal: Negative for myalgias and neck pain.  Extremity: trace edema  Imaging: US Renal  06/25/2016  CLINICAL DATA:  65 year old diabetic hypertensive female with stage 3 kidney disease. Subsequent encounter. EXAM: RENAL / URINARY TRACT ULTRASOUND COMPLETE COMPARISON:  06/13/2016 ultrasound.  03/11/2016 CT. FINDINGS: Right Kidney: Length: 10.6 cm. Echogenicity within normal limits. No mass or hydronephrosis visualized. Left Kidney: Length: 10.7 cm. Echogenicity within normal limits. No mass or hydronephrosis visualized. Bladder: Appears normal for degree of bladder distention. IMPRESSION: No hydronephrosis. Limited evaluation of bladder secondary to decompression. Right-sided pleural effusion suspected. Electronically Signed   By: Genia Del M.D.   On: 06/25/2016 17:13    Labs: [employer]  Recent [employer] 06/20/16 [id number]  06/22/16 [id number]  06/23/16 0006 06/23/16 0405 06/23/16 0801 06/23/16 1603 06/24/16 06/24/16 0727 06/25/16 0322 06/26/16 0228  NA 145  --  141  --   --  138  --   --  142  --  140 140  K 4.5  --  4.6  --   --  5.3*  --   --  4.3  --  4.3 4.2  CL 102  --  97*  --   --  93*  --   --  97*  --  101 101  CO2 35*  --  39*  --   --  31  --   --  34*  --  29 29  GLUCOSE 209*  --  176*  --   --  354*  --   --  118*  --  202* 76  BUN 80*  --  102*  --   --  117*  --   --  121*  --  108* 103*  CREATININE 2.83*  --  2.94*  --   --  3.42*  --   --  3.60*  --   3.79* 3.62*  CALCIUM 9.7  --  9.0  --   --  8.8*  --   --  8.0*  --  7.8* 8.0*  PHOS 2.0*  < >  --   < > 4.4  --  5.8* 5.9* 7.0* 7.0* 6.8* 5.9*  < > = values in this interval not displayed. CBC  Recent Labs Lab 06/20/16 0035 06/22/16 0312  WBC 11.2* 8.5  HGB 10.1* 9.6*  HCT 31.6* 31.7*  MCV 91.3 96.1  PLT 242 281    Medications:    . antiseptic oral rinse  7 mL Mouth Rinse q12n4p  . calcium acetate  1,334 mg Oral TID WC  . carbamide peroxide  5 drop Right Ear BID  . carvedilol  3.125 mg Oral BID WC  . chlorhexidine  15 mL Mouth Rinse BID  . cholecalciferol  5,000 Units Oral Daily  . clopidogrel  75 mg Oral QHS  . feeding supplement (GLUCERNA SHAKE)  237 mL Oral TID BM  . fenofibrate  160 mg Oral QHS  . FLUoxetine  20 mg Oral Daily  . gabapentin  200 mg Oral TID  . Gerhardt's butt cream   Topical BID  . heparin subcutaneous  5,000 Units Subcutaneous Q8H  . insulin aspart  0-20 Units Subcutaneous TID WC  . insulin aspart  0-5 Units Subcutaneous QHS  . insulin aspart  6 Units Subcutaneous TID WC  . [START ON 06/27/2016] insulin glargine  8 Units Subcutaneous Daily  . isosorbide mononitrate  60 mg Oral Daily  . loratadine  10 mg Oral Daily  . pantoprazole  40 mg Oral Q1200  . simvastatin  20 mg Oral QHS  . topiramate  50 mg Oral BID      Otelia Santee, MD 06/26/2016, 10:42 AM

## 2016-06-26 NOTE — Progress Notes (Signed)
Progress Note    [name]  [id number] DOB: 10-15-1951  DOA: 06/13/2016 PCP: [name], MD    Brief Narrative:   [name] is an 65 y.o. female with PMH significant for DM, CAD, HTN, and recent admission for fall resulting in a right femoral neck fracture s/p arthoplasty 06/07/16. Post operative course was fairly uncomplicated with the exception of some mild anemia requiring PRBC transfusion and UTI treated with levaquin. She was discharged to [facility] for rehabilitation on 06/11/16. She was readmitted 06/13/16 with altered mental status and hypotension concerning for sepsis. Lactic acid was WNL. Treated for DKA which was also present on admission.  Assessment/Plan:   Principal Problems:   Acute respiratory failure with hypoxemia (HCC) secondary to pulmonary edema with possible left base HCAP Continue supplemental oxygen and BiPAP at bedtime. Continue pulmonary hygiene. Continue oxygen to maintain oxygen saturations 88-92 percent. Levaquin discontinued 06/24/16.Very deconditioned with prolonged hospital stay. CIR consult requested.    Shock secondary to hypovolemia and probable sepsis Blood and urine cultures negative. Urine Strep (-); Legionella Antigen (-) Antibiotics narrowed to Levaquin status post 8 days of therapy with Fortaz and vancomycin. Repeat chest x-ray showed an interval decrease in bilateral diffuse airspace disease. WBC now normalized. Antibiotics stopped 06/24/16.  Active Problems:   CAD (coronary artery disease)/demand ischemia Continue Coreg, labetalol, Imdur, fenofibrate, Zocor and Plavix.    Diabetic ketoacidosis without coma associated with type 2 diabetes mellitus (HCC)/metabolic acidosis Resolved. Diabetes currently being managed with resistant scale SSI with 6 units of meal coverage and 10 units of Lantus. CBGs 62-229. Decrease Lantus to 8 units.    AKI (acute kidney injury) (HCC)/stage III-IV chronic kidney disease Baseline  creatinine 1.9-2.0. Current creatinine remains elevated over usual baseline values. Continue IVF. Nephrology consulted. Repeat SPEP and SFLC. Renal ultrasound on 06/25/16, no hydronephrosis. Renal function slightly improved after being given 1000 cc of normal saline yesterday.    Hypertension Continue Coreg and labetalol.    Hyperlipidemia Continue fenofibrate and Zocor.    Possible pancreatitis/elevated lipase Diet has now been advanced. NG tube removed.  Poor appetite.    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  Monitor counts. No current indication for transfusion. Platelets normalized. Repeat SPEP/SFLC ordered by nephrologist.    Acute encephalopathy More alert. Neurontin & Topamax dose adjusted given impaired renal function. Encephalopathy improved, but continues to be frail.    Hypo/hyperphosphatemia Initially repleted, now high.  Likely reflective of progressive renal failure.  Continue PhosLo, improving.    Hyperkalemia Resolved s/p Kayexalate 06/23/16.    Anxiety Ativan ordered as needed Q 8 hours. Continue Prozac.  Family Communication/Anticipated D/C date and plan/Code Status   DVT prophylaxis: Heparin ordered. Code Status: Full Code.  Family Communication: Ardis Hughs, called at [phone], updated by telephone. Disposition Plan: Lives alone. Will likely need SNF.   Medical Consultants:    Pulmonology   Procedures:   TTE (06/24/15): EF 55-60%. LA & RA normal in size. RV normal in size and function. No aortic stenosis or regurgitation. Mild mitral regurgitation. Mild tricuspid regurgitation. Port CXR 6/14: Bilateral hilar fullness and lower lobe opacification with silhouetting of the left hemidiaphragm. Port Abd X-ray 6/14: No free air under diaphragm. Right hip hemiarthroplasty. Bowel gas pattern nonobstructive.  Anti-Infectives:   Tressie Ellis 6/14 >>6/22 Vancomycin 6/14 >> 6/22 Levaquin 6/22 >>  06/24/16  Subjective:   [name] reports that she has been having severe reflux  type symptoms and had an episode of nausea/vomiting yesterday. Gets pain in her chest during these episodes. No shortness of breath. She had a soft but formed bowel movement today. Says her appetite is "okay ".  Objective:    Filed Vitals:   06/25/16 0207 06/25/16 1346 06/25/16 1944 06/26/16 0428  BP:  121/45 120/54 130/55  Pulse:  82 84 90  Temp:  97.8 F (36.6 C) 98.1 F (36.7 C) 98.5 F (36.9 C)  TempSrc:  Oral Oral Oral  Resp:  17 20 20   Height:      Weight: 55.8 kg (123 lb 0.3 oz)     SpO2:  96% 98% 96%    Intake/Output Summary (Last 24 hours) at 06/26/16 0733 Last data filed at 06/25/16 1600  Gross per 24 hour  Intake    480 ml  Output    150 ml  Net    330 ml   Filed Weights   06/22/16 0500 06/23/16 0500 06/25/16 0207  Weight: 45.5 kg (100 lb 5 oz) 48.2 kg (106 lb 4.2 oz) 55.8 kg (123 lb 0.3 oz)    Exam: General exam: Weak, less anxious. Respiratory system: Clear to auscultation, diminished in the bases. Respiratory effort normal. Cardiovascular system: S1 & S2 heard, mildly tachycardic. No JVD, rubs, gallops or clicks. No murmurs. Gastrointestinal system: Abdomen is nondistended, soft and nontender. No organomegaly or masses felt. Normal bowel sounds heard. Central nervous system: Awake, oriented x2. No focal neurological deficits. Extremities: No significant edema. Skin: No rashes, lesions or ulcers Psychiatry: Judgement and insight appear normal. Mood & affect mildly anxious.   Data Reviewed:   I have personally reviewed following labs and imaging studies:  Labs: Basic Metabolic Panel:  Recent Labs Lab 06/22/16 0312  06/23/16 0006 06/23/16 0405 06/23/16 0801 06/23/16 1603 06/24/16 06/24/16 0727 06/25/16 0322 06/26/16 0228  NA 141  --   --  138  --   --  142  --  140 140  K 4.6  --   --  5.3*  --   --  4.3  --  4.3 4.2  CL 97*  --   --  93*  --   --  97*   --  101 101  CO2 39*  --   --  31  --   --  34*  --  29 29  GLUCOSE 176*  --   --  354*  --   --  118*  --  202* 76  BUN 102*  --   --  117*  --   --  121*  --  108* 103*  CREATININE 2.94*  --   --  3.42*  --   --  3.60*  --  3.79* 3.62*  CALCIUM 9.0  --   --  8.8*  --   --  8.0*  --  7.8* 8.0*  MG  --   < > 2.1  --  2.0 2.1 2.1 2.0  --   --   PHOS  --   < > 4.4  --  5.8* 5.9* 7.0* 7.0* 6.8* 5.9*  < > = values in this interval not displayed. GFR Estimated Creatinine Clearance: 12.6 mL/min (by C-G formula based on Cr of 3.62).  CBC:  Recent Labs Lab 06/20/16 0035 06/22/16 0312  WBC 11.2* 8.5  HGB 10.1* 9.6*  HCT 31.6* 31.7*  MCV 91.3 96.1  PLT 242 281   CBG:  Recent Labs Lab 06/25/16 1618 06/25/16 2102 06/25/16  2156 06/26/16 0052 06/26/16 0611  GLUCAP 229* 62* 85 87 80   Sepsis Labs:  Recent Labs Lab 06/20/16 0035 06/21/16 1121 06/21/16 1526 06/22/16 0312 06/23/16 0405  PROCALCITON 8.76 6.64 6.34 5.47 3.84  WBC 11.2*  --   --  8.5  --    Microbiology No results found for this or any previous visit (from the past 240 hour(s)).  Radiology: US Renal  06/25/2016  CLINICAL DATA:  65 year old diabetic hypertensive female with stage 3 kidney disease. Subsequent encounter. EXAM: RENAL / URINARY TRACT ULTRASOUND COMPLETE COMPARISON:  06/13/2016 ultrasound.  03/11/2016 CT. FINDINGS: Right Kidney: Length: 10.6 cm. Echogenicity within normal limits. No mass or hydronephrosis visualized. Left Kidney: Length: 10.7 cm. Echogenicity within normal limits. No mass or hydronephrosis visualized. Bladder: Appears normal for degree of bladder distention. IMPRESSION: No hydronephrosis. Limited evaluation of bladder secondary to decompression. Right-sided pleural effusion suspected. Electronically Signed   By: Genia Del M.D.   On: 06/25/2016 17:13    Medications:   . antiseptic oral rinse  7 mL Mouth Rinse q12n4p  . calcium acetate  667 mg Oral TID WC  . carbamide peroxide  5  drop Right Ear BID  . carvedilol  3.125 mg Oral BID WC  . chlorhexidine  15 mL Mouth Rinse BID  . cholecalciferol  5,000 Units Oral Daily  . clopidogrel  75 mg Oral QHS  . feeding supplement (GLUCERNA SHAKE)  237 mL Oral TID BM  . fenofibrate  160 mg Oral QHS  . FLUoxetine  20 mg Oral Daily  . gabapentin  200 mg Oral TID  . Gerhardt's butt cream   Topical BID  . heparin subcutaneous  5,000 Units Subcutaneous Q8H  . insulin aspart  0-20 Units Subcutaneous TID WC  . insulin aspart  0-5 Units Subcutaneous QHS  . insulin aspart  6 Units Subcutaneous TID WC  . insulin glargine  10 Units Subcutaneous [id number]  . isosorbide mononitrate  60 mg Oral Daily  . loratadine  10 mg Oral Daily  . pantoprazole  40 mg Oral Q1200  . simvastatin  20 mg Oral QHS  . topiramate  50 mg Oral BID   Continuous Infusions: . sodium chloride 75 mL/hr at 06/25/16 1226  . sodium chloride 75 mL/hr at 06/25/16 1956    Time spent: 35 minutes.  The patient is medically complex with multiple co-morbidities and is at high risk for clinical deterioration and requires high complexity decision making.    LOS: 13 days   [facility] Pager [phone]. If unable to reach me by pager, please call my cell phone at [phone].  *Please refer to [email or web address], password [email or web address] to get updated schedule on who will round on this patient, as hospitalists switch teams weekly. If 7PM-7AM, please contact night-coverage at [email or web address], password [email or web address] for any overnight needs.  06/26/2016, 7:33 AM

## 2016-06-27 LAB — RENAL FUNCTION PANEL
ALBUMIN: 1.7 g/dL — AB (ref 3.5–5.0)
ANION GAP: 10 (ref 5–15)
BUN: 96 mg/dL — AB (ref 6–20)
CO2: 24 mmol/L (ref 22–32)
Calcium: 8 mg/dL — ABNORMAL LOW (ref 8.9–10.3)
Chloride: 105 mmol/L (ref 101–111)
Creatinine, Ser: 3.67 mg/dL — ABNORMAL HIGH (ref 0.44–1.00)
GFR calc Af Amer: 14 mL/min — ABNORMAL LOW (ref >60)
GFR, EST NON AFRICAN AMERICAN: 12 mL/min — AB (ref >60)
Glucose, Bld: 199 mg/dL — ABNORMAL HIGH (ref 65–99)
PHOSPHORUS: 5 mg/dL — AB (ref 2.5–4.6)
POTASSIUM: 4.6 mmol/L (ref 3.5–5.1)
Sodium: 139 mmol/L (ref 135–145)

## 2016-06-27 LAB — GLUCOSE, CAPILLARY
GLUCOSE-CAPILLARY: 124 mg/dL — AB (ref 65–99)
GLUCOSE-CAPILLARY: 82 mg/dL (ref 65–99)
Glucose-Capillary: 265 mg/dL — ABNORMAL HIGH (ref 65–99)
Glucose-Capillary: 189 mg/dL — ABNORMAL HIGH (ref 65–99)
Glucose-Capillary: 43 mg/dL — CL (ref 65–99)
Glucose-Capillary: 60 mg/dL — ABNORMAL LOW (ref 65–99)

## 2016-06-27 LAB — APTT: aPTT: 23 seconds — ABNORMAL LOW (ref 24–37)

## 2016-06-27 LAB — PROTIME-INR
INR: 1.06 (ref 0.00–1.49)
PROTHROMBIN TIME: 14 s (ref 11.6–15.2)

## 2016-06-27 MED ORDER — CLOPIDOGREL BISULFATE 75 MG PO TABS: 75.0000 mg | ORAL_TABLET | Freq: Every day | ORAL | Status: DC

## 2016-06-27 MED ORDER — GI COCKTAIL ~~LOC~~
30.0000 mL | Freq: Once | ORAL | Status: AC
  Administered 2016-06-27: 30 mL via ORAL
  Filled 2016-06-27 (×2): qty 30

## 2016-06-27 NOTE — Progress Notes (Signed)
[facility] Progress Note    Assessment/ Plan:    65 y.o. female with a PMHx of diabetes mellitus type 2, hyperlipidemia, MGUS, anemia chronic kidney disease, hypertension, coronary artery disease, GERD, chronic kidney disease stage IV BL Cr 1.9-2.2, depression, who was admitted to [facility] on 6/14 with hypotension and concern for sepsis.Renal function initially improved but then has had a consistent trend to a decline in function @ which time we were consulted on 6/26.  Assessment/ Plan:  1.  Acute Kidney Injury on CKD IV - patient already with advanced CKD IV even with a baseline creatinine of 1.9-2.2 based on her age and body mass. Fortunately renal function appears to have started to stabilize. - She has had a few hypotensive episodes but none sustained and did not receive contrast during this hospitalization. - Urine microscopy which I personally reviewed showed only a few pigmented casts, yeast, ~3 WBC/hpf and no RBC's. - Postvoid residual (25ml) and renal ultrasound --> no mention of thinning of the cortex or hyperehoic cortex.  - SFLC + SPEP w/ monoclonal spike and increased kappa and lambda lightchains; incr k/l ratio. - I am requesting a renal biopsy given that is not likely to be purely a prerenal azotemic state --> suspicious for an infiltrative disorder.   2. Shock resolved with sepsis and negative urine + blood cultures now off Abx (had been treated with Levaquin --> Fortaz + Vancomycin completed on 6/25) 3. CASHD - recent MI before she received a renal biopsy at [facility]. 4. Hypertension - fairly well controlled. 5. MGUS - repeated SPEP and SFLC 6. Hyperphosphatemia - binders (increased to 2 tabs w/ each meal). 7. Acute encephalopathy - primary team appropriately adjusting per impaired renal function.       Subjective:   Feeling about the same as yesterday. Has intermittent chest pain not associated with exertion. No f/c/n/v. Had CP this AM resolved with  nitro x3.   Objective:   BP 108/55 mmHg  Pulse 97  Temp(Src) 98.5 F (36.9 C) (Oral)  Resp 20  Ht 5\' 3"  (1.6 m)  Wt 55.43 kg (122 lb 3.2 oz)  BMI 21.65 kg/m2  SpO2 97%  Intake/Output Summary (Last 24 hours) at 06/27/16 1133 Last data filed at 06/27/16 0800  Gross per 24 hour  Intake    360 ml  Output   1652 ml  Net  -1292 ml   Weight change:   Physical Exam: General appearance: alert and cooperative Neck: no adenopathy, no carotid bruit, no JVD, supple, symmetrical, trachea midline and thyroid not enlarged, symmetric, no tenderness/mass/nodules Resp: clear to auscultation bilaterally w/ poor air movement Chest wall: no tenderness Cardio: regular rate and rhythm, S1, S2 normal, no murmur, click, rub or gallop GI: soft, non-tender; bowel sounds normal; no masses, no organomegaly Extremities: extremities normal, atraumatic, no cyanosis; tr  edema Pulses: 2+ and symmetric Skin: Skin color, texture, turgor normal. No rashes or lesions  Imaging: US Renal  06/25/2016  CLINICAL DATA:  65 year old diabetic hypertensive female with stage 3 kidney disease. Subsequent encounter. EXAM: RENAL / URINARY TRACT ULTRASOUND COMPLETE COMPARISON:  06/13/2016 ultrasound.  03/11/2016 CT. FINDINGS: Right Kidney: Length: 10.6 cm. Echogenicity within normal limits. No mass or hydronephrosis visualized. Left Kidney: Length: 10.7 cm. Echogenicity within normal limits. No mass or hydronephrosis visualized. Bladder: Appears normal for degree of bladder distention. IMPRESSION: No hydronephrosis. Limited evaluation of bladder secondary to decompression. Right-sided pleural effusion suspected. Electronically Signed   By: Alcide Evener.D.  On: 06/25/2016 17:13    Labs: BMET  Recent Labs Lab 06/22/16 0312  06/23/16 0405 06/23/16 0801 06/23/16 1603 06/24/16 06/24/16 0727 06/25/16 0322 06/26/16 0228 06/27/16 0330  NA 141  --  138  --   --  142  --  140 140 139  K 4.6  --  5.3*  --   --  4.3  --   4.3 4.2 4.6  CL 97*  --  93*  --   --  97*  --  101 101 105  CO2 39*  --  31  --   --  34*  --  29 29 24   GLUCOSE 176*  --  354*  --   --  118*  --  202* 76 199*  BUN 102*  --  117*  --   --  121*  --  108* 103* 96*  CREATININE 2.94*  --  3.42*  --   --  3.60*  --  3.79* 3.62* 3.67*  CALCIUM 9.0  --  8.8*  --   --  8.0*  --  7.8* 8.0* 8.0*  PHOS  --   < >  --  5.8* 5.9* 7.0* 7.0* 6.8* 5.9* 5.0*  < > = values in this interval not displayed. CBC  Recent Labs Lab 06/22/16 0312  WBC 8.5  HGB 9.6*  HCT 31.7*  MCV 96.1  PLT 281    Medications:    . antiseptic oral rinse  7 mL Mouth Rinse q12n4p  . calcium acetate  1,334 mg Oral TID WC  . carbamide peroxide  5 drop Right Ear BID  . carvedilol  3.125 mg Oral BID WC  . chlorhexidine  15 mL Mouth Rinse BID  . cholecalciferol  5,000 Units Oral Daily  . clopidogrel  75 mg Oral QHS  . feeding supplement (GLUCERNA SHAKE)  237 mL Oral TID BM  . fenofibrate  160 mg Oral QHS  . FLUoxetine  20 mg Oral Daily  . gabapentin  200 mg Oral TID  . Gerhardt's butt cream   Topical BID  . heparin subcutaneous  5,000 Units Subcutaneous Q8H  . insulin aspart  0-20 Units Subcutaneous TID WC  . insulin aspart  0-5 Units Subcutaneous QHS  . insulin aspart  6 Units Subcutaneous TID WC  . insulin glargine  8 Units Subcutaneous Daily  . isosorbide mononitrate  60 mg Oral Daily  . loratadine  10 mg Oral Daily  . pantoprazole  40 mg Oral BID  . simvastatin  20 mg Oral QHS  . topiramate  50 mg Oral BID      Otelia Santee, MD 06/27/2016, 11:33 AM

## 2016-06-27 NOTE — Consult Note (Signed)
   [facility] Inpatient Consult   06/27/2016  RYLANN TOLLY 13-Nov-1951 [id number]  Patient screened for potential [place] Management services.  Pt readmitted from SNF with sepsis and respiratory failure. Pt with recent rt hip fx and hemiarthroplasty on 06/08/16. PMH - DM, CAD, CKD, falls, HTN.  Patient is eligible for [place] with [facility]. Electronic medical record reveals patient's discharge plan is for skilled nursing facility.   Spoke with inpatient RNCM and the plan is currently to return to [facility] for skilled nursing for probable long-term.  [facility] Care Management services not appropriate at this time. If patient's post hospital needs change please place a [facility] Care Management consult. For questions please contact:   Natividad Brood, RN BSN [facility] Liaison  [phone] business mobile phone Toll free office [phone]

## 2016-06-27 NOTE — Progress Notes (Signed)
Pt sleeping soundly. I alerted RN CM and SW that pt is not a candidate for an inpt rehab admission at this time. SNF continues to be recommended. [id number]

## 2016-06-27 NOTE — H&P (Signed)
Chief Complaint: Chronic kidney disease stage 4  Referring Physician(s): Stage 4  Supervising Physician: Jacqulynn Cadet  Patient Status: Inpatient  History of Present Illness: [name] is a 65 y.o. female diabetes mellitus type 2, hyperlipidemia, anemia, hypertension, coronary artery disease with history of coronary stent on Plavix, GERD, and chronic kidney disease stage IV.   She was recently admitted from 6/6 to 6/12 after suffering a fall resulting in a right femoral neck fracture.  She underwent arthoplasty on 6/8.   Her hospital course was fairly complicated by some mild anemia requiring PRBC transfusion and UTI treated with Levaquin.   She was discharged to a SNF on 6/12 but readmiited 2 days later with altered mental status and hypotension concerning for sepsis.  Review of her chart shows she was quite ill, encephalopathic, and required BiPAP although she is better on my visit today.  Her encephalopathy seems resolved as she was appropriate today and a pretty good historian.  She reports "they had to give me 3 nitro last night".  She also states "I'm not doing well".   She tells me they are "working up" her chest pain and she is worried because her cardiologist is in [place].   We are asked to evaluate her for random renal biopsy.   Past Medical History  Diagnosis Date  . DIABETES MELLITUS, II, COMPLICATIONS 99991111  . HYPERLIPIDEMIA 02/27/2007  . 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 ANEMIA, OTHER, UNSPECIFIED 02/27/2007  . DEPRESSIVE DISORDER, NOS 02/27/2007  . HYPERTENSION, BENIGN SYSTEMIC 02/27/2007  . CORONARY, ARTERIOSCLEROSIS 02/27/2007  . TIA 05/17/2009  . GASTROESOPHAGEAL REFLUX, NO ESOPHAGITIS 02/27/2007  . RENAL INSUFFICIENCY, CHRONIC 12/28/2009  . Renal failure, acute on chronic (HCC) 01/25/2014  . Blood transfusion without reported diagnosis   . Anemia   . Cellulitis and abscess of unspecified site 05/02/2010    Qualifier: Diagnosis  of  By: Deborra Medina MD, Talia      Past Surgical History  Procedure Laterality Date  . Partial hysterectomy    . Rectocele repair    . Bladder surgery      Tack  . Hip arthroplasty Right 06/07/2016    Procedure: ARTHROPLASTY BIPOLAR HIP (HEMIARTHROPLASTY);  Surgeon: Altamese Calloway, MD;  Location: [facility];  Service: Orthopedics;  Laterality: Right;    Allergies: Codeine; Hydralazine hcl; Bisphosphonates; Codeine phosphate; Penicillins; and Sulfa antibiotics  Medications: Prior to Admission medications   Medication Sig Start Date End Date Taking? Authorizing Provider  acetaminophen (TYLENOL) 325 MG tablet Take 2 tablets (650 mg total) by mouth every 6 (six) hours as needed for mild pain (or Fever >/= 101). 06/08/16  Yes Ainsley Spinner, PA-C  ALPRAZolam Duanne Moron) 0.5 MG tablet Take 1 tablet (0.5 mg total) by mouth 3 (three) times daily as needed for anxiety. 06/11/16  Yes Clanford Marisa Hua, MD  amLODipine (NORVASC) 5 MG tablet Take 1 tablet (5 mg total) by mouth daily. 06/11/16  Yes Clanford Marisa Hua, MD  carvedilol (COREG) 3.125 MG tablet Take 1 tablet (3.125 mg total) by mouth 2 (two) times daily with a meal. 02/10/16  Yes Sital Mody, MD  Cholecalciferol (VITAMIN D3) 5000 units TABS Take 5,000 Units by mouth daily.   Yes Historical Provider, MD  Choline Fenofibrate (FENOFIBRIC ACID) 135 MG CPDR Take 135 mg by mouth at bedtime.    Yes Historical Provider, MD  clopidogrel (PLAVIX) 75 MG tablet Take 75 mg by mouth at bedtime.    Yes Historical Provider, MD  diphenoxylate-atropine (LOMOTIL) 2.5-0.025 MG/5ML liquid  Take 10 mLs by mouth 4 (four) times daily as needed for diarrhea or loose stools. 06/11/16  Yes Clanford Marisa Hua, MD  enoxaparin (LOVENOX) 30 MG/0.3ML injection Inject 0.3 mLs (30 mg total) into the skin daily. 06/08/16  Yes Ainsley Spinner, PA-C  FLUoxetine (PROZAC) 20 MG capsule Take 20 mg by mouth daily.    Yes Historical Provider, MD  gabapentin (NEURONTIN) 300 MG capsule Take 600 mg by mouth 3 (three)  times daily.    Yes Historical Provider, MD  insulin lispro (HUMALOG) 100 UNIT/ML injection Inject 3 Units into the skin 3 (three) times daily with meals. Patient uses as needed sliding scale. Hold if check blood glucose < 150.   Yes Historical Provider, MD  isosorbide mononitrate (IMDUR) 60 MG 24 hr tablet Take 1 tablet (60 mg total) by mouth daily. 02/10/16  Yes Bettey Costa, MD  lip balm (BLISTEX) OINT Apply 1 application topically as needed for lip care. 06/11/16  Yes Clanford Marisa Hua, MD  lisinopril (PRINIVIL,ZESTRIL) 5 MG tablet Take 1 tablet (5 mg total) by mouth at bedtime. 06/11/16  Yes Clanford Marisa Hua, MD  nitroGLYCERIN (NITROSTAT) 0.4 MG SL tablet Place 0.4 mg under the tongue every 5 (five) minutes as needed for chest pain. Reported on 06/13/2016   Yes Historical Provider, MD  pantoprazole (PROTONIX) 40 MG tablet Take 40 mg by mouth daily.   Yes Historical Provider, MD  simvastatin (ZOCOR) 20 MG tablet Take 20 mg by mouth at bedtime.    Yes Historical Provider, MD  topiramate (TOPAMAX) 100 MG tablet Take 100 mg by mouth 2 (two) times daily.    Yes Historical Provider, MD  feeding supplement, ENSURE ENLIVE, (ENSURE ENLIVE) LIQD Take 237 mLs by mouth 2 (two) times daily between meals. 06/11/16   Clanford Marisa Hua, MD  Glucagon, rDNA, (GLUCAGON EMERGENCY IJ) Inject 1 mg as directed once as needed (for severe hypoglycemia). Reported on 06/13/2016    Historical Provider, MD     Family History  Problem Relation Age of Onset  . Cancer Mother     unknown  . CAD Mother   . Cancer Sister     unknown cancer ?breast ca    Social History   Social History  . Marital Status: Divorced    Spouse Name: N/A  . Number of Children: 2  . Years of Education: N/A   Occupational History  . Disabled    Social History Main Topics  . Smoking status: Never Smoker   . Smokeless tobacco: Never Used  . Alcohol Use: No  . Drug Use: No  . Sexual Activity: Not Asked   Other Topics Concern  . None    Social History Narrative    Review of Systems: A 12 point ROS discussed  Review of Systems  Constitutional: Positive for activity change, appetite change and fatigue. Negative for fever and chills.  HENT: Negative.   Respiratory: Negative for cough, shortness of breath and wheezing.   Cardiovascular: Positive for chest pain.  Gastrointestinal: Negative for nausea, vomiting and abdominal pain.  Genitourinary: Negative.   Musculoskeletal: Negative.   Skin: Negative.   Neurological: Negative.   Hematological: Negative.   Psychiatric/Behavioral: Negative.     Vital Signs: BP 142/75 mmHg  Pulse 95  Temp(Src) 98.1 F (36.7 C) (Oral)  Resp 18  Ht 5\' 3"  (1.6 m)  Wt 122 lb 3.2 oz (55.43 kg)  BMI 21.65 kg/m2  SpO2 95%  Physical Exam  Constitutional: She is oriented to person, place,  and time.  Thin, frail  HENT:  Head: Normocephalic and atraumatic.  Eyes: EOM are normal.  Cardiovascular: Regular rhythm.   Rate 90-100  Pulmonary/Chest: Effort normal and breath sounds normal. No respiratory distress.  Abdominal: Soft. Bowel sounds are normal.  Musculoskeletal: Normal range of motion.  Neurological: She is alert and oriented to person, place, and time.  Skin: Skin is warm and dry.  Psychiatric: She has a normal mood and affect. Her behavior is normal. Judgment and thought content normal.  Vitals reviewed.   Mallampati Score:  MD Evaluation Airway: WNL Heart: WNL Abdomen: WNL Chest/ Lungs: WNL ASA  Classification: 3 Mallampati/Airway Score: One  Imaging: Dg Chest 1 View  06/15/2016  CLINICAL DATA:  Pneumonia EXAM: CHEST 1 VIEW COMPARISON:  06/13/2016 FINDINGS: Cardiac shadow is stable. Significant increase in bilateral infiltrates is seen. A component of this may be related to pulmonary edema as well. No bony abnormality is seen. IMPRESSION: Significant increase in bilateral infiltrates. Electronically Signed   By: Inez Catalina M.D.   On: 06/15/2016 11:25   Dg Chest  1 View  06/05/2016  CLINICAL DATA:  Status post fall today with obvious deformity of the right leg. EXAM: CHEST 1 VIEW COMPARISON:  February 07, 2016. FINDINGS: The heart size and mediastinal contours are stable. The heart size is mildly enlarged. Both lungs are clear. The visualized skeletal structures are stable. IMPRESSION: No active cardiopulmonary disease. Electronically Signed   By: Abelardo Diesel M.D.   On: 06/05/2016 18:36   Abd 1 View (kub)  06/06/2016  CLINICAL DATA:  Chronic diarrhea for 3-4 years. Large distal colonic stool burden on recent CT. EXAM: ABDOMEN - 1 VIEW COMPARISON:  CT on 03/11/2016 FINDINGS: No evidence of dilated bowel loops. A small to moderate amount of stool is seen within the descending and rectosigmoid colon. IMPRESSION: No acute findings.  Small to moderate stool burden again noted. Electronically Signed   By: Earle Gell M.D.   On: 06/06/2016 08:24   Ct Head Wo Contrast  06/05/2016  CLINICAL DATA:  65 year old female with fall and trauma to the head. EXAM: CT HEAD WITHOUT CONTRAST CT CERVICAL SPINE WITHOUT CONTRAST TECHNIQUE: Multidetector CT imaging of the head and cervical spine was performed following the standard protocol without intravenous contrast. Multiplanar CT image reconstructions of the cervical spine were also generated. COMPARISON:  Head CT dated 06/28/2015 FINDINGS: CT HEAD FINDINGS The ventricles and sulci are appropriate in size for patient's age. Mild periventricular and deep white matter chronic microvascular ischemic changes noted. There is no acute intracranial hemorrhage. No mass effect or midline shift noted. There is partial opacification of the right sphenoid sinus. The remainder of the visualized paranasal sinuses and mastoid air cells are clear. The calvarium is intact. CT CERVICAL SPINE FINDINGS There is no acute fracture or subluxation of the cervical spine.There is osteopenia with minimal degenerative changes.The odontoid and spinous processes are  intact.There is normal anatomic alignment of the C1-C2 lateral masses. The visualized soft tissues appear unremarkable. IMPRESSION: No acute intracranial hemorrhage. No Acute/traumatic cervical spine pathology. Electronically Signed   By: Anner Crete M.D.   On: 06/05/2016 20:34   Ct Cervical Spine Wo Contrast  06/05/2016  CLINICAL DATA:  65 year old female with fall and trauma to the head. EXAM: CT HEAD WITHOUT CONTRAST CT CERVICAL SPINE WITHOUT CONTRAST TECHNIQUE: Multidetector CT imaging of the head and cervical spine was performed following the standard protocol without intravenous contrast. Multiplanar CT image reconstructions of the cervical spine were  also generated. COMPARISON:  Head CT dated 06/28/2015 FINDINGS: CT HEAD FINDINGS The ventricles and sulci are appropriate in size for patient's age. Mild periventricular and deep white matter chronic microvascular ischemic changes noted. There is no acute intracranial hemorrhage. No mass effect or midline shift noted. There is partial opacification of the right sphenoid sinus. The remainder of the visualized paranasal sinuses and mastoid air cells are clear. The calvarium is intact. CT CERVICAL SPINE FINDINGS There is no acute fracture or subluxation of the cervical spine.There is osteopenia with minimal degenerative changes.The odontoid and spinous processes are intact.There is normal anatomic alignment of the C1-C2 lateral masses. The visualized soft tissues appear unremarkable. IMPRESSION: No acute intracranial hemorrhage. No Acute/traumatic cervical spine pathology. Electronically Signed   By: Anner Crete M.D.   On: 06/05/2016 20:34   US Abdomen Complete  06/13/2016  CLINICAL DATA:  Acute onset of generalized abdominal pain. Elevated lipase and bilirubin. Initial encounter. EXAM: ABDOMEN ULTRASOUND COMPLETE COMPARISON:  CT of the abdomen and pelvis performed 03/11/2016, and renal ultrasound performed 02/08/2016 FINDINGS: Gallbladder: No  gallstones or wall thickening visualized. Trace sludge is noted within the gallbladder. No sonographic Murphy sign noted by sonographer. Common bile duct: Diameter: 0.3 cm, within normal limits in caliber. Liver: No focal lesion identified. Within normal limits in parenchymal echogenicity. Trace fluid is noted adjacent to the gallbladder fossa. IVC: No abnormality visualized. Pancreas: Visualized portion unremarkable. Spleen: Size and appearance within normal limits. Right Kidney: Length: 11.1 cm. Increased renal parenchymal echogenicity noted. Trace right-sided perinephric fluid is seen. No mass or hydronephrosis visualized. Left Kidney: Length: 10.0 cm. Increased renal parenchymal echogenicity noted. No mass or hydronephrosis visualized. Abdominal aorta: No aneurysm visualized. Scattered calcification is seen along the abdominal aorta. Other findings: Small bilateral pleural effusions are seen. IMPRESSION: 1. No acute abnormality seen within the abdomen. 2. Trace sludge within the gallbladder. Gallbladder otherwise unremarkable. 3. Increased renal parenchymal echogenicity likely reflects medical renal disease. 4. Trace fluid noted adjacent to the gallbladder fossa. 5. Trace right-sided perinephric fluid seen. 6. Small bilateral pleural effusions noted. 7. Scattered calcification along the abdominal aorta. Electronically Signed   By: Garald Balding M.D.   On: 06/13/2016 23:36   US Renal  06/25/2016  CLINICAL DATA:  65 year old diabetic hypertensive female with stage 3 kidney disease. Subsequent encounter. EXAM: RENAL / URINARY TRACT ULTRASOUND COMPLETE COMPARISON:  06/13/2016 ultrasound.  03/11/2016 CT. FINDINGS: Right Kidney: Length: 10.6 cm. Echogenicity within normal limits. No mass or hydronephrosis visualized. Left Kidney: Length: 10.7 cm. Echogenicity within normal limits. No mass or hydronephrosis visualized. Bladder: Appears normal for degree of bladder distention. IMPRESSION: No hydronephrosis. Limited  evaluation of bladder secondary to decompression. Right-sided pleural effusion suspected. Electronically Signed   By: Genia Del M.D.   On: 06/25/2016 17:13   Dg Chest Port 1 View  06/22/2016  CLINICAL DATA:  Acute respiratory failure EXAM: PORTABLE CHEST 1 VIEW COMPARISON:  06/20/2016 FINDINGS: 0652 hours. Patient rotated to the right. Lungs are hyperexpanded. Interstitial markings are diffusely coarsened with chronic features. The diffuse bilateral airspace disease seen previously is decreased in the interval. The cardio pericardial silhouette is enlarged. Feeding tube is coiled in the stomach with the tip positioned in the region of the antrum. Telemetry leads overlie the chest. IMPRESSION: Interval decrease in bilateral diffuse airspace disease. Electronically Signed   By: Misty Stanley M.D.   On: 06/22/2016 08:17   Dg Chest Port 1 View  06/20/2016  CLINICAL DATA:  Smoker, Healthcare associated pneumonia  EXAM: PORTABLE CHEST 1 VIEW COMPARISON:  06/17/2016 FINDINGS: Cardiomediastinal silhouette is stable. Again noted diffuse bilateral infiltrates right greater than left with slight improvement in aeration. NG feeding tube coiled within stomach with tip not included in the film. Degenerative changes left shoulder. IMPRESSION: Again noted diffuse bilateral infiltrates right greater than left with slight improvement in aeration from prior exam. Electronically Signed   By: Lahoma Crocker M.D.   On: 06/20/2016 09:09   Dg Chest Port 1 View  06/17/2016  CLINICAL DATA:  Patient with acute respiratory failure. Severe shortness of breath. EXAM: PORTABLE CHEST 1 VIEW COMPARISON:  Chest radiograph 06/15/2016. FINDINGS: Monitoring leads overlie the patient. Cardiomediastinal contours largely obscured. Grossly unchanged diffuse bilateral airspace opacities. Probable small bilateral pleural Fahrenheit scratch of small bilateral pleural effusions. No pneumothorax. IMPRESSION: Grossly unchanged diffuse bilateral airspace  opacities. Small bilateral pleural effusions. Electronically Signed   By: Lovey Newcomer M.D.   On: 06/17/2016 08:03   Dg Chest Port 1 View  06/13/2016  CLINICAL DATA:  Altered mental status with abdominal pain for 2 days. History of diabetes and anemia. EXAM: PORTABLE CHEST 1 VIEW COMPARISON:  06/05/2016 and 02/07/2016 radiographs. FINDINGS: 1729 hours. There are lower lung volumes. Allowing for this, the heart size and mediastinal contours are stable. There are new left-greater-than-right perihilar and lower lobe airspace opacities with a probable small left pleural effusion. No evidence of pneumothorax. The bones appear unchanged. IMPRESSION: New lower lung volumes with left-greater-than-right perihilar and lower lobe airspace opacities. These findings are nonspecific and may reflect asymmetric edema, although are worrisome for possible aspiration. Electronically Signed   By: Richardean Sale M.D.   On: 06/13/2016 17:39   Dg Knee Right Port  06/06/2016  CLINICAL DATA:  Femoral neck fracture, fall this morning EXAM: PORTABLE RIGHT KNEE - 1-2 VIEW COMPARISON:  None FINDINGS: Three views of the right knee submitted. No acute fracture or subluxation. There is diffuse osteopenia. Mild narrowing of medial joint compartment. Small joint effusion. Atherosclerotic calcifications of femoral and popliteal artery. IMPRESSION: No acute fracture or subluxation. Mild degenerative changes. Small joint effusion. Diffuse osteopenia. Electronically Signed   By: Lahoma Crocker M.D.   On: 06/06/2016 12:10   Dg Abd Portable 1v  06/18/2016  CLINICAL DATA:  Feeding tube placement EXAM: PORTABLE ABDOMEN - 1 VIEW COMPARISON:  06/13/2016 FINDINGS: Feeding tube coils in the proximal stomach with the tip in the distal stomach. Nonobstructive bowel gas pattern. IMPRESSION: Feeding tube tip in the distal stomach. Electronically Signed   By: Rolm Baptise M.D.   On: 06/18/2016 13:00   Dg Abd Portable 1v  06/13/2016  CLINICAL DATA:  Altered  mental status with abdominal pain for 2 days. History of diabetes and anemia. EXAM: PORTABLE ABDOMEN - 1 VIEW COMPARISON:  Radiographs 06/06/2016.  CT 03/11/2016. FINDINGS: 1728 hours. Prominent stool is again noted within the rectum. The bowel gas pattern is nonobstructive. There is no evidence of bowel wall thickening or free air. Interval right hip hemiarthroplasty and scattered vascular calcifications are noted. IMPRESSION: No acute abdominal findings. Prominent stool in the rectum, similar to prior study. Electronically Signed   By: Richardean Sale M.D.   On: 06/13/2016 17:41   Dg Hip Port Unilat With Pelvis 1v Right  06/07/2016  CLINICAL DATA:  Right femoral neck fracture.  Postop imaging. EXAM: DG HIP (WITH OR WITHOUT PELVIS) 1V PORT RIGHT COMPARISON:  Two days ago FINDINGS: New bipolar right hip hemiarthroplasty without periprosthetic fracture. The prosthesis is located. Negative visualized  bony pelvis and left hip. Osteopenia and atherosclerosis. IMPRESSION: No adverse finding after right hip hemiarthroplasty. Electronically Signed   By: Monte Fantasia M.D.   On: 06/07/2016 22:24   Dg Hip Unilat  With Pelvis 2-3 Views Right  06/05/2016  CLINICAL DATA:  Status post fall today with right leg deformity. EXAM: DG HIP (WITH OR WITHOUT PELVIS) 2-3V RIGHT COMPARISON:  None. FINDINGS: There is displaced fracture of the right femoral neck. No other fracture is identified. There is no dislocation. IMPRESSION: Displaced fracture of the right femoral. Electronically Signed   By: Abelardo Diesel M.D.   On: 06/05/2016 18:35    Labs:  CBC:  Recent Labs  06/17/16 0010 06/18/16 0020 06/20/16 0035 06/22/16 0312  WBC 17.3* 13.9* 11.2* 8.5  HGB 11.1* 10.1* 10.1* 9.6*  HCT 32.4* 29.8* 31.6* 31.7*  PLT 196 225 242 281    COAGS:  Recent Labs  06/05/16 1740 06/06/16 0652 06/07/16 0350 06/27/16 1134  INR 1.25 1.17 1.24 1.06  APTT  --   --  36 23*    BMP:  Recent Labs  06/24/16 06/25/16 0322  06/26/16 0228 06/27/16 0330  NA 142 140 140 139  K 4.3 4.3 4.2 4.6  CL 97* 101 101 105  CO2 34* 29 29 24   GLUCOSE 118* 202* 76 199*  BUN 121* 108* 103* 96*  CALCIUM 8.0* 7.8* 8.0* 8.0*  CREATININE 3.60* 3.79* 3.62* 3.67*  GFRNONAA 12* 11* 12* 12*  GFRAA 14* 13* 14* 14*    LIVER FUNCTION TESTS:  Recent Labs  06/05/16 1740 06/07/16 0350 06/13/16 1618 06/14/16 0320 06/25/16 0322 06/26/16 0228 06/27/16 0330  BILITOT 0.6 1.1 2.3* 0.8  --   --   --   AST 28 23 44* 40  --   --   --   ALT 24 19 17 14   --   --   --   ALKPHOS 45 30* 78 65  --   --   --   PROT 5.4* 4.6* 4.5* 4.1*  --   --   --   ALBUMIN 3.0* 2.4* 2.0* 1.8* 1.7* 1.6* 1.7*    TUMOR MARKERS: No results for input(s): AFPTM, CEA, CA199, CHROMGRNA in the last 8760 hours.  Assessment and Plan:  Acute on chronic kidney disease IV  CAD with history of stent, on Plavix, recent chest pain. Also on Sub-Cu heparin.  Will need to hold Plavix x 5 days prior to renal biopsy (Last dose yesterday).  Can proceed with biopsy next Monday.   If patient is discharged prior to next Wednesday, this can be arranged as outpatient.  Risks and Benefits discussed with the patient including, but not limited to bleeding, infection, damage to adjacent structures or low yield requiring additional tests.  All of the patient's questions were answered, patient is agreeable to proceed.  Thank you for this interesting consult.  I greatly enjoyed meeting DEJHA BORCHERT and look forward to participating in their care.  A copy of this report was sent to the requesting provider on this date.  Electronically Signed: Murrell Redden PA-C 06/27/2016, 1:42 PM   I spent a total of 40 Minutes in face to face in clinical consultation, greater than 50% of which was counseling/coordinating care for random renal biopsy

## 2016-06-27 NOTE — Progress Notes (Signed)
PROGRESS NOTE    TAKENYA [name]  [id number] DOB: Jun 21, 1951 DOA: 06/13/2016 PCP: Latoya Pink, MD   Brief Narrative:  HPI on 06/13/2016 by Dr. Cain [name] (ICU) 65 year old female with PMH as below, which includes DM, CAD, HTN, and recent admission for R femoral neck fracture s/p arthoplasty 6/8. She had suffered several fall prior to the fall that caused the fracture. Post operative course was fairly uncomplicated with the exception of some mild anemia requiring PRBC transfusion and UTI treated with levaquin. She was discharged to [facility] for rehabilitation on 6/12. 6/14 she returned to ER with chief complaint of AMS with associated hypotension. In the emergency department she was noted to be lethargic and confused. She was complaining of abdominal pain. She was hypotensive and hypothermic with elevated WBC. Glucose noted to be markedly elevated, however urine ketones negative. With metabolic acidosis. Lactic acid wnl. Due to persistent hypotension and concern for septic shock PCCM asked to admit.   Interim history Treated for DKA, hypotension, and altered mental status. Found to have AKI.  Nephrology consulted.   Assessment & Plan   Acute respiratory failure with hypoxemia (HCC) secondary to pulmonary edema with possible left base HCAP -Continue supplemental oxygen and BiPAP at bedtime.  -Continue pulmonary hygiene and oxygen to maintain saturations 88-92 percent.  -Levaquin discontinued 06/24/16. -Very deconditioned with prolonged hospital stay.  -CIR consulted and felt patient was not a candidate for inpatient rehab -likely will return to SNF  AKI (acute kidney injury) (HCC)/stage III-IV chronic kidney disease -Baseline creatinine 1.9-2.0. -Current creatinine remains elevated, today 3.67.  -Placed on IVF. -Nephrology consulted and appreciated.  -Renal ultrasound on 06/25/16, no hydronephrosis.  -Will hold IVF today as patient does have LE edema -SFLC and SPEP + with  monoclonal spike and increase L lambda light chains. -renal biopsy ordered  Shock secondary to hypovolemia and probable sepsis -Blood and urine cultures negative.  -Strep pneumonia and legionella urine antigens negative -Antibiotics narrowed to Levaquin status post 8 days of therapy with [name] and vancomycin.  -Repeat chest x-ray showed an interval decrease in bilateral diffuse airspace disease. -WBC now normalized.  -Antibiotics discontinued on 06/24/16.  CAD (coronary artery disease)/demand ischemia -Continue Coreg, labetalol, Imdur, fenofibrate, Zocor and Plavix.  Diabetic ketoacidosis without coma associated with type 2 diabetes mellitus (HCC)/metabolic acidosis -Resolved.  -Continue ISS and lantus with CBG monitoring   Hypertension -Continue Coreg and labetalol.  Hyperlipidemia -Continue fenofibrate and Zocor.  Possible pancreatitis/elevated lipase -Diet has now been advanced. NG tube removed. Poor appetite.  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  -Platelets normalized.  -Repeat SPEP/SFLC as above  Acute encephalopathy -Improving, more alert today -Neurontin & Topamax dose adjusted given impaired renal function.   Hypo/hyperphosphatemia -Initially repleted, now high. Likely reflective of progressive renal failure.  -Continue PhosLo, improving.  Hyperkalemia -Resolved s/p Kayexalate 06/23/16.  Anxiety -Ativan ordered as needed Q 8 hours. Continue Prozac.  DVT Prophylaxis  Heparin  Code Status: Full  Family Communication: None at bedside  Disposition Plan: Admitted. Pending further recommendations from nephrology  Consultants Nephrology PCCM Inpatient Rehab  Procedures  TTE (06/24/15): EF 55-60%. LA & RA normal in size. RV normal in size and function. No aortic stenosis or regurgitation. Mild mitral regurgitation. Mild tricuspid regurgitation. Port CXR 6/14: Bilateral hilar fullness and lower lobe  opacification with silhouetting of the left hemidiaphragm. Port Abd X-ray 6/14: No free air under diaphragm. Right hip hemiarthroplasty. Bowel gas pattern nonobstructive.  Antibiotics   Anti-infectives    Start     Dose/Rate Route Frequency Ordered Stop   06/23/16 1630  levofloxacin (LEVAQUIN) IVPB 500 mg  Status:  Discontinued     500 mg 100 mL/hr over 60 Minutes Intravenous Every 48 hours 06/21/16 1043 06/24/16 0958   06/21/16 1630  levofloxacin (LEVAQUIN) IVPB 750 mg     750 mg 100 mL/hr over 90 Minutes Intravenous  Once 06/21/16 1043 06/21/16 1720   06/18/16 1700  cefTAZidime (FORTAZ) 1 g in dextrose 5 % 50 mL IVPB  Status:  Discontinued     1 g 100 mL/hr over 30 Minutes Intravenous Every 24 hours 06/17/16 1728 06/21/16 1032   06/18/16 1200  vancomycin (VANCOCIN) IVPB 750 mg/150 ml premix  Status:  Discontinued     750 mg 150 mL/hr over 60 Minutes Intravenous Every 48 hours 06/17/16 1728 06/21/16 1032   06/14/16 1630  vancomycin (VANCOCIN) 500 mg in sodium chloride 0.9 % 100 mL IVPB  Status:  Discontinued     500 mg 100 mL/hr over 60 Minutes Intravenous Every 24 hours 06/13/16 1630 06/17/16 1702   06/13/16 1645  cefTAZidime (FORTAZ) 2 g in dextrose 5 % 50 mL IVPB  Status:  Discontinued     2 g 100 mL/hr over 30 Minutes Intravenous Every 24 hours 06/13/16 1630 06/17/16 1728   06/13/16 1645  vancomycin (VANCOCIN) IVPB 1000 mg/200 mL premix  Status:  Discontinued     1,000 mg 200 mL/hr over 60 Minutes Intravenous  Once 06/13/16 1630 06/13/16 1631   06/13/16 1630  levofloxacin (LEVAQUIN) IVPB 750 mg  Status:  Discontinued     750 mg 100 mL/hr over 90 Minutes Intravenous  Once 06/13/16 1617 06/13/16 1624   06/13/16 1630  aztreonam (AZACTAM) 2 g in dextrose 5 % 50 mL IVPB  Status:  Discontinued     2 g 100 mL/hr over 30 Minutes Intravenous  Once 06/13/16 1617 06/13/16 1624   06/13/16 1630  vancomycin (VANCOCIN) IVPB 1000 mg/200 mL premix     1,000 mg 200 mL/hr over 60 Minutes  Intravenous  Once 06/13/16 1617 06/13/16 2105      Subjective:   [name] seen and examined today.  Patient complained of acid reflux with chest pain yesterday. She also feels her ears "are stopped up". She denies shortness of breath, abdominal pain, headache, dizziness.   Objective:   Filed Vitals:   06/27/16 0033 06/27/16 0114 06/27/16 0430 06/27/16 0832  BP: 133/62 145/65 140/74 108/55  Pulse: 96 96 100 97  Temp:  98.7 F (37.1 C) 98.5 F (36.9 C)   TempSrc:  Oral Oral   Resp:  15 20   Height:      Weight:   55.43 kg (122 lb 3.2 oz)   SpO2: 100% 99% 97%     Intake/Output Summary (Last 24 hours) at 06/27/16 1212 Last data filed at 06/27/16 0800  Gross per 24 hour  Intake    360 ml  Output   1652 ml  Net  -1292 ml   Filed Weights   06/23/16 0500 06/25/16 0207 06/27/16 0430  Weight: 48.2 kg (106 lb 4.2 oz) 55.8 kg (123 lb 0.3 oz) 55.43 kg (122 lb 3.2 oz)    Exam  General: Well developed,elderly, NAD  HEENT: NCAT,  mucous membranes moist.   Cardiovascular: S1 S2 auscultated, no murmurs, RRR  Respiratory: Clear to auscultation bilaterally  Abdomen: Soft, nontender, nondistended, + bowel sounds  Extremities: warm dry without cyanosis  clubbing. LE edema  Neuro: AAOx3, nonfocal  Psych: Anxious, however appropriate    Data Reviewed: I have personally reviewed following labs and imaging studies  CBC:  Recent Labs Lab 06/22/16 0312  WBC 8.5  HGB 9.6*  HCT 31.7*  MCV 96.1  PLT [id number]   Basic Metabolic Panel:  Recent Labs Lab 06/23/16 0006 06/23/16 0405 06/23/16 0801 06/23/16 1603 06/24/16 06/24/16 0727 06/25/16 0322 06/26/16 0228 06/27/16 0330  NA  --  138  --   --  142  --  140 140 139  K  --  5.3*  --   --  4.3  --  4.3 4.2 4.6  CL  --  93*  --   --  97*  --  101 101 105  CO2  --  31  --   --  34*  --  29 29 24   GLUCOSE  --  354*  --   --  118*  --  202* 76 199*  BUN  --  117*  --   --  121*  --  108* 103* 96*  CREATININE  --  3.42*   --   --  3.60*  --  3.79* 3.62* 3.67*  CALCIUM  --  8.8*  --   --  8.0*  --  7.8* 8.0* 8.0*  MG 2.1  --  2.0 2.1 2.1 2.0  --   --   --   PHOS 4.4  --  5.8* 5.9* 7.0* 7.0* 6.8* 5.9* 5.0*   GFR: Estimated Creatinine Clearance: 12.5 mL/min (by C-G formula based on Cr of 3.67). Liver Function Tests:  Recent Labs Lab 06/25/16 0322 06/26/16 0228 06/27/16 0330  ALBUMIN 1.7* 1.6* 1.7*   No results for input(s): LIPASE, AMYLASE in the last 168 hours. No results for input(s): AMMONIA in the last 168 hours. Coagulation Profile: No results for input(s): INR, PROTIME in the last 168 hours. Cardiac Enzymes: No results for input(s): CKTOTAL, CKMB, CKMBINDEX, TROPONINI in the last 168 hours. BNP (last 3 results) No results for input(s): PROBNP in the last 8760 hours. HbA1C: No results for input(s): HGBA1C in the last 72 hours. CBG:  Recent Labs Lab 06/26/16 1113 06/26/16 1622 06/26/16 2101 06/27/16 0610 06/27/16 1111  GLUCAP 232* 160* 102* 265* 189*   Lipid Profile: No results for input(s): CHOL, HDL, LDLCALC, TRIG, CHOLHDL, LDLDIRECT in the last 72 hours. Thyroid Function Tests: No results for input(s): TSH, T4TOTAL, FREET4, T3FREE, THYROIDAB in the last 72 hours. Anemia Panel: No results for input(s): VITAMINB12, FOLATE, FERRITIN, TIBC, IRON, RETICCTPCT in the last 72 hours. Urine analysis:    Component Value Date/Time   COLORURINE AMBER* 06/13/2016 1615   COLORURINE Yellow 05/04/2014 0914   APPEARANCEUR TURBID* 06/13/2016 1615   APPEARANCEUR Clear 05/04/2014 0914   LABSPEC 1.021 06/13/2016 1615   LABSPEC 1.010 05/04/2014 0914   LABSPEC 1.020 02/14/2011 1654   PHURINE 5.0 06/13/2016 1615   PHURINE 5.0 05/04/2014 0914   PHURINE 6.0 02/14/2011 1654   GLUCOSEU 250* 06/13/2016 1615   GLUCOSEU >=500 05/04/2014 0914   HGBUR TRACE* 06/13/2016 1615   HGBUR 1+ 05/04/2014 0914   HGBUR Negative 02/14/2011 1654   HGBUR negative 05/17/2009 1334   BILIRUBINUR SMALL* 06/13/2016 1615     BILIRUBINUR Negative 05/04/2014 0914   BILIRUBINUR Negative 02/14/2011 [place] 06/13/2016 1615   KETONESUR Negative 05/04/2014 0914   KETONESUR Negative 02/14/2011 1654   PROTEINUR NEGATIVE 06/13/2016 1615   PROTEINUR Negative 05/04/2014 0914  PROTEINUR Negative 02/14/2011 1654   UROBILINOGEN 0.2 09/17/2009 0233   NITRITE NEGATIVE 06/13/2016 1615   NITRITE Negative 05/04/2014 0914   NITRITE Negative 02/14/2011 1654   LEUKOCYTESUR LARGE* 06/13/2016 1615   LEUKOCYTESUR 2+ 05/04/2014 0914   LEUKOCYTESUR Trace 02/14/2011 1654   Sepsis Labs: @LABRCNTIP (procalcitonin:4,lacticidven:4)  )No results found for this or any previous visit (from the past 240 hour(s)).    Radiology Studies: US Renal  06/25/2016  CLINICAL DATA:  65 year old diabetic hypertensive female with stage 3 kidney disease. Subsequent encounter. EXAM: RENAL / URINARY TRACT ULTRASOUND COMPLETE COMPARISON:  06/13/2016 ultrasound.  03/11/2016 CT. FINDINGS: Right Kidney: Length: 10.6 cm. Echogenicity within normal limits. No mass or hydronephrosis visualized. Left Kidney: Length: 10.7 cm. Echogenicity within normal limits. No mass or hydronephrosis visualized. Bladder: Appears normal for degree of bladder distention. IMPRESSION: No hydronephrosis. Limited evaluation of bladder secondary to decompression. Right-sided pleural effusion suspected. Electronically Signed   By: Genia Del M.D.   On: 06/25/2016 17:13     Scheduled Meds: . antiseptic oral rinse  7 mL Mouth Rinse q12n4p  . calcium acetate  1,334 mg Oral TID WC  . carbamide peroxide  5 drop Right Ear BID  . carvedilol  3.125 mg Oral BID WC  . chlorhexidine  15 mL Mouth Rinse BID  . cholecalciferol  5,000 Units Oral Daily  . clopidogrel  75 mg Oral QHS  . feeding supplement (GLUCERNA SHAKE)  237 mL Oral TID BM  . fenofibrate  160 mg Oral QHS  . FLUoxetine  20 mg Oral Daily  . gabapentin  200 mg Oral TID  . Gerhardt's butt cream   Topical BID   . heparin subcutaneous  5,000 Units Subcutaneous Q8H  . insulin aspart  0-20 Units Subcutaneous TID WC  . insulin aspart  0-5 Units Subcutaneous QHS  . insulin aspart  6 Units Subcutaneous TID WC  . insulin glargine  8 Units Subcutaneous Daily  . isosorbide mononitrate  60 mg Oral Daily  . loratadine  10 mg Oral Daily  . pantoprazole  40 mg Oral BID  . simvastatin  20 mg Oral QHS  . topiramate  50 mg Oral BID   Continuous Infusions: . sodium chloride 75 mL/hr at 06/26/16 1203  . sodium chloride       LOS: 14 days   Time Spent in minutes   30 minutes  Cataleyah Colborn D.O. on 06/27/2016 at 12:12 PM  Between 7am to 7pm - Pager - [phone]  After 7pm go to [email or web address] - password [email or web address]  And look for the night coverage person covering for me after hours  [facility] Office  [phone]

## 2016-06-27 NOTE — Care Management Important Message (Signed)
Important Message  Patient Details  Name: [name] MRN: [id number] Date of Birth: 03-11-1951   Medicare Important Message Given:  Yes    Loann Quill 06/27/2016, 9:59 AM

## 2016-06-27 NOTE — Progress Notes (Signed)
Patient refusing CPAP for tonight. 

## 2016-06-27 NOTE — Progress Notes (Signed)
0001- Pt complaining of burning and indigestion. PRN medication given. Pt becoming increasingly upset and tearful as she talks about family situation. Pt emotionally worked up and began complaining of central chest pressure and burning. EKG taken. VSS. PRN anxiety meds given. Pain relieved by SL nitro x3. Instructed to call if pain returns.  0122- Pt complaining of 6/10 chest burning, pressure and tightness. VSS. O2 applied. Pt instructed on deep breathing exercises. Pt calmed down and pain resolved. MD notified. New orders given. Will continue to monitor closely.  Raliegh Ip RN

## 2016-06-28 LAB — IRON AND TIBC
Iron: 162 ug/dL (ref 28–170)
Saturation Ratios: 69 % — ABNORMAL HIGH (ref 10.4–31.8)
TIBC: 234 ug/dL — ABNORMAL LOW (ref 250–450)
UIBC: 72 ug/dL

## 2016-06-28 LAB — GLUCOSE, CAPILLARY
GLUCOSE-CAPILLARY: 273 mg/dL — AB (ref 65–99)
GLUCOSE-CAPILLARY: 187 mg/dL — AB (ref 65–99)
Glucose-Capillary: 150 mg/dL — ABNORMAL HIGH (ref 65–99)
Glucose-Capillary: 296 mg/dL — ABNORMAL HIGH (ref 65–99)
Glucose-Capillary: 225 mg/dL — ABNORMAL HIGH (ref 65–99)
Glucose-Capillary: 248 mg/dL — ABNORMAL HIGH (ref 65–99)
Glucose-Capillary: 181 mg/dL — ABNORMAL HIGH (ref 65–99)

## 2016-06-28 LAB — BASIC METABOLIC PANEL WITH GFR
Anion gap: 7 (ref 5–15)
BUN: 96 mg/dL — ABNORMAL HIGH (ref 6–20)
CO2: 28 mmol/L (ref 22–32)
Calcium: 8.3 mg/dL — ABNORMAL LOW (ref 8.9–10.3)
Chloride: 105 mmol/L (ref 101–111)
Creatinine, Ser: 3.74 mg/dL — ABNORMAL HIGH (ref 0.44–1.00)
GFR calc Af Amer: 14 mL/min — ABNORMAL LOW
GFR calc non Af Amer: 12 mL/min — ABNORMAL LOW
Glucose, Bld: 135 mg/dL — ABNORMAL HIGH (ref 65–99)
Potassium: 5.3 mmol/L — ABNORMAL HIGH (ref 3.5–5.1)
Sodium: 140 mmol/L (ref 135–145)

## 2016-06-28 LAB — FOLATE: Folate: 8.5 ng/mL (ref >5.9)

## 2016-06-28 LAB — CBC
HCT: 22.8 % — ABNORMAL LOW (ref 36.0–46.0)
Hemoglobin: 7.2 g/dL — ABNORMAL LOW (ref 12.0–15.0)
MCH: 30.4 pg (ref 26.0–34.0)
MCHC: 31.6 g/dL (ref 30.0–36.0)
MCV: 96.2 fL (ref 78.0–100.0)
Platelets: 213 K/uL (ref 150–400)
RBC: 2.37 MIL/uL — ABNORMAL LOW (ref 3.87–5.11)
RDW: 17.7 % — ABNORMAL HIGH (ref 11.5–15.5)
WBC: 5.2 K/uL (ref 4.0–10.5)

## 2016-06-28 LAB — RETICULOCYTES
RBC.: 2.7 MIL/uL — AB (ref 3.87–5.11)
RETIC COUNT ABSOLUTE: 86.4 10*3/uL (ref 19.0–186.0)
RETIC CT PCT: 3.2 % — AB (ref 0.4–3.1)

## 2016-06-28 LAB — FERRITIN: Ferritin: 1990 ng/mL — ABNORMAL HIGH (ref 11–307)

## 2016-06-28 LAB — VITAMIN B12: VITAMIN B 12: 1372 pg/mL — AB (ref 180–914)

## 2016-06-28 MED ORDER — DIPHENHYDRAMINE-ZINC ACETATE 2-0.1 % EX CREA
TOPICAL_CREAM | Freq: Three times a day (TID) | CUTANEOUS | Status: DC | PRN
  Administered 2016-06-28: 14:00:00 via TOPICAL
  Filled 2016-06-28: qty 28

## 2016-06-28 NOTE — Clinical Social Work Note (Signed)
CSW continuing to follow for support and discharge needs. Shonny Dezarai Prew, LCSW [phone] 

## 2016-06-28 NOTE — Progress Notes (Signed)
Inpatient Diabetes Program Recommendations  AACE/ADA: New Consensus Statement on Inpatient Glycemic Control (2015)  Target Ranges:  Prepandial:   less than 140 mg/dL      Peak postprandial:   less than 180 mg/dL (1-2 hours)      Critically ill patients:  140 - 180 mg/dL   Results for [name], [name] (MRN [id number]) as of 06/28/2016 10:13  Ref. Range 06/27/2016 16:26 06/27/2016 19:10 06/27/2016 19:53 06/27/2016 20:32 06/28/2016 06:16  Glucose-Capillary Latest Ref Range: 65-99 mg/dL 124 (H) 43 (LL) 60 (L) 82 150 (H)    Review of Glycemic Control  Inpatient Diabetes Program Recommendations: Reviewed CBGs. Please consider decreasing Novolog correction to sensitive 0-9 units tid with meals and decrease in meal coverage to Novolog 5 units meal coverage tid if eats > 50%.  Thank you, Nani Gasser. Khani Paino, RN, MSN, CDE Inpatient Glycemic Control Team Team Pager [phone] (8am-5pm) 06/28/2016 10:17 AM

## 2016-06-28 NOTE — Progress Notes (Signed)
Physical Therapy Treatment Patient Details Name: [name] MRN: [id number] DOB: [birth year] Today's Date: 07-03-16    History of Present Illness Pt readmitted from SNF with sepsis and respiratory failure. Pt placed on bipap. Pt with recent rt hip fx and hemiarthroplasty on 06/08/16. PMH - DM, CAD, CKD, falls, HTN    PT Comments    Pt making slow, steady progress.  Follow Up Recommendations  SNF     Equipment Recommendations  None recommended by PT    Recommendations for Other Services       Precautions / Restrictions Precautions Precautions: Fall;Posterior Hip Restrictions RLE Weight Bearing: Weight bearing as tolerated    Mobility  Bed Mobility Overal bed mobility: Needs Assistance Bed Mobility: Supine to Sit     Supine to sit: Min assist     General bed mobility comments: Assist to elevate trunk into sitting  Transfers Overall transfer level: Needs assistance Equipment used: Rolling walker (2 wheeled) Transfers: Sit to/from Stand Sit to Stand: Mod assist         General transfer comment: Assist to bring hips up.  Ambulation/Gait Ambulation/Gait assistance: Min assist Ambulation Distance (Feet): 20 Feet Assistive device: Rolling walker (2 wheeled) Gait Pattern/deviations: Step-to pattern;Decreased step length - right;Decreased step length - left;Antalgic Gait velocity: decr Gait velocity interpretation: Below normal speed for age/gender General Gait Details: Assist for balance and support   Stairs            Wheelchair Mobility    Modified Rankin (Stroke Patients Only)       Balance Overall balance assessment: Needs assistance Sitting-balance support: No upper extremity supported;Feet supported Sitting balance-Leahy Scale: Fair     Standing balance support: Bilateral upper extremity supported Standing balance-Leahy Scale: Poor Standing balance comment: support of walker and min A for static standing                     Cognition Arousal/Alertness: Awake/alert Behavior During Therapy: WFL for tasks assessed/performed Overall Cognitive Status: Within Functional Limits for tasks assessed                      Exercises      General Comments        Pertinent Vitals/Pain Pain Assessment: Faces Faces Pain Scale: Hurts little more Pain Location: rt hip and lt knee Pain Descriptors / Indicators: Guarding;Sore Pain Intervention(s): Limited activity within patient's tolerance;Monitored during session;Repositioned    Home Living                      Prior Function            PT Goals (current goals can now be found in the care plan section) Progress towards PT goals: Progressing toward goals    Frequency  Min 2X/week    PT Plan Current plan remains appropriate    Co-evaluation             End of Session Equipment Utilized During Treatment: Gait belt Activity Tolerance: Patient limited by fatigue Patient left: in chair;with call bell/phone within reach     Time: 1543-1603 PT Time Calculation (min) (ACUTE ONLY): 20 min  Charges:  $Gait Training: 8-22 mins                    G Codes:      [name] 2016/07/03, 6:09 PM [facility] PT [phone]

## 2016-06-28 NOTE — Progress Notes (Signed)
PROGRESS NOTE    [name]  [id number] DOB: [birth year] DOA: 06/13/2016 PCP: [name], MD   Brief Narrative:  HPI on 06/13/2016 by Dr. Cain Saupe (ICU) 65 year old female with PMH as below, which includes DM, CAD, HTN, and recent admission for R femoral neck fracture s/p arthoplasty 6/8. She had suffered several fall prior to the fall that caused the fracture. Post operative course was fairly uncomplicated with the exception of some mild anemia requiring PRBC transfusion and UTI treated with levaquin. She was discharged to [facility] for rehabilitation on 6/12. 6/14 she returned to ER with chief complaint of AMS with associated hypotension. In the emergency department she was noted to be lethargic and confused. She was complaining of abdominal pain. She was hypotensive and hypothermic with elevated WBC. Glucose noted to be markedly elevated, however urine ketones negative. With metabolic acidosis. Lactic acid wnl. Due to persistent hypotension and concern for septic shock PCCM asked to admit.   Interim history Treated for DKA, hypotension, and altered mental status. Found to have AKI.  Nephrology consulted.   Assessment & Plan   Acute respiratory failure with hypoxemia (HCC) secondary to pulmonary edema with possible left base HCAP -Continue supplemental oxygen and BiPAP at bedtime.  -Continue pulmonary hygiene and oxygen to maintain saturations 88-92 percent.  -Levaquin discontinued 06/24/16. -Very deconditioned with prolonged hospital stay.  -CIR consulted and felt patient was not a candidate for inpatient rehab -likely will return to SNF  AKI (acute kidney injury) (HCC)/stage III-IV chronic kidney disease -Baseline creatinine 1.9-2.0. -Current creatinine remains elevated, today 3.74 -Placed on IVF. -Nephrology consulted and appreciated.  -Renal ultrasound on 06/25/16, no hydronephrosis.  -Will hold IVF today as patient does have LE edema -SFLC and SPEP + with  monoclonal spike and increase L lambda light chains. -renal biopsy ordered- pending for 07/02/2016  Shock secondary to hypovolemia and probable sepsis -Blood and urine cultures negative.  -Strep pneumonia and legionella urine antigens negative -Antibiotics narrowed to Levaquin status post 8 days of therapy with Tressie Ellis and vancomycin.  -Repeat chest x-ray showed an interval decrease in bilateral diffuse airspace disease. -WBC now normalized.  -Antibiotics discontinued on 06/24/16.  CAD (coronary artery disease)/demand ischemia -Continue Coreg, labetalol, Imdur, fenofibrate, Zocor and Plavix.  Diabetic ketoacidosis without coma associated with type 2 diabetes mellitus (HCC)/metabolic acidosis -Resolved.  -Continue ISS and lantus with CBG monitoring   Hypertension -Continue Coreg and labetalol.  Hyperlipidemia -Continue fenofibrate and Zocor.  Possible pancreatitis/elevated lipase -Diet has now been advanced. NG tube removed. Poor appetite.  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  -Platelets normalized.  -Repeat SPEP/SFLC as above -hemoglobin 7.2 today -pending FOBT and anemia panel -Continue to monitor CBC -Will hold heparin  Acute encephalopathy -Improving, more alert today -Neurontin & Topamax dose adjusted given impaired renal function.   Hypo/hyperphosphatemia -Initially repleted, now high. Likely reflective of progressive renal failure.  -Continue PhosLo, improving.  Hyperkalemia -potassium mildly elevated to 5.3 today -Continue to monitor   Anxiety -Ativan ordered as needed Q 8 hours. Continue Prozac.  DVT Prophylaxis  Heparin--> SCDs  Code Status: Full  Family Communication: None at bedside  Disposition Plan: Admitted. Pending further recommendations from nephrology  Consultants Nephrology PCCM Inpatient Rehab Interventional raidiology   Procedures  TTE (06/24/15): EF 55-60%. LA & RA normal in size. RV  normal in size and function. No aortic stenosis or regurgitation. Mild mitral regurgitation. Mild tricuspid regurgitation. Port CXR 6/14: Bilateral hilar  fullness and lower lobe opacification with silhouetting of the left hemidiaphragm. Port Abd X-ray 6/14: No free air under diaphragm. Right hip hemiarthroplasty. Bowel gas pattern nonobstructive.  Antibiotics   Anti-infectives    Start     Dose/Rate Route Frequency Ordered Stop   06/23/16 1630  levofloxacin (LEVAQUIN) IVPB 500 mg  Status:  Discontinued     500 mg 100 mL/hr over 60 Minutes Intravenous Every 48 hours 06/21/16 1043 06/24/16 0958   06/21/16 1630  levofloxacin (LEVAQUIN) IVPB 750 mg     750 mg 100 mL/hr over 90 Minutes Intravenous  Once 06/21/16 1043 06/21/16 1720   06/18/16 1700  cefTAZidime (FORTAZ) 1 g in dextrose 5 % 50 mL IVPB  Status:  Discontinued     1 g 100 mL/hr over 30 Minutes Intravenous Every 24 hours 06/17/16 1728 06/21/16 1032   06/18/16 1200  vancomycin (VANCOCIN) IVPB 750 mg/150 ml premix  Status:  Discontinued     750 mg 150 mL/hr over 60 Minutes Intravenous Every 48 hours 06/17/16 1728 06/21/16 1032   06/14/16 1630  vancomycin (VANCOCIN) 500 mg in sodium chloride 0.9 % 100 mL IVPB  Status:  Discontinued     500 mg 100 mL/hr over 60 Minutes Intravenous Every 24 hours 06/13/16 1630 06/17/16 1702   06/13/16 1645  cefTAZidime (FORTAZ) 2 g in dextrose 5 % 50 mL IVPB  Status:  Discontinued     2 g 100 mL/hr over 30 Minutes Intravenous Every 24 hours 06/13/16 1630 06/17/16 1728   06/13/16 1645  vancomycin (VANCOCIN) IVPB 1000 mg/200 mL premix  Status:  Discontinued     1,000 mg 200 mL/hr over 60 Minutes Intravenous  Once 06/13/16 1630 06/13/16 1631   06/13/16 1630  levofloxacin (LEVAQUIN) IVPB 750 mg  Status:  Discontinued     750 mg 100 mL/hr over 90 Minutes Intravenous  Once 06/13/16 1617 06/13/16 1624   06/13/16 1630  aztreonam (AZACTAM) 2 g in dextrose 5 % 50 mL IVPB  Status:  Discontinued     2 g 100  mL/hr over 30 Minutes Intravenous  Once 06/13/16 1617 06/13/16 1624   06/13/16 1630  vancomycin (VANCOCIN) IVPB 1000 mg/200 mL premix     1,000 mg 200 mL/hr over 60 Minutes Intravenous  Once 06/13/16 1617 06/13/16 2105      Subjective:   Melodye Ped seen and examined today.  Patient states she is not feeling well.  Wants her family to have a meeting to they can understand that she is sick. She denies shortness of breath, abdominal pain, headache, dizziness.   Objective:   Filed Vitals:   06/27/16 0832 06/27/16 1340 06/27/16 2119 06/28/16 0251  BP: 108/55 142/75 123/55 132/76  Pulse: 97 95 93 90  Temp:  98.1 F (36.7 C) 98.3 F (36.8 C) 98.7 F (37.1 C)  TempSrc:  Oral Oral Oral  Resp:  18 18 18   Height:      Weight:      SpO2:  95% 94% 93%    Intake/Output Summary (Last 24 hours) at 06/28/16 1132 Last data filed at 06/28/16 0835  Gross per 24 hour  Intake    827 ml  Output      0 ml  Net    827 ml   Filed Weights   06/23/16 0500 06/25/16 0207 06/27/16 0430  Weight: 48.2 kg (106 lb 4.2 oz) 55.8 kg (123 lb 0.3 oz) 55.43 kg (122 lb 3.2 oz)    Exam  General: Well developed,elderly,  no distress  HEENT: NCAT,  mucous membranes moist.   Cardiovascular: S1 S2 auscultated, no murmurs, RRR  Respiratory: Clear to auscultation  Abdomen: Soft, nontender, nondistended, + bowel sounds  Extremities: warm dry without cyanosis clubbing. +LE edema  Neuro: AAOx3, nonfocal  Psych: Anxious   Data Reviewed: I have personally reviewed following labs and imaging studies  CBC:  Recent Labs Lab 06/22/16 0312 06/28/16 0234  WBC 8.5 5.2  HGB 9.6* 7.2*  HCT 31.7* 22.8*  MCV 96.1 96.2  PLT 281 [id number]   Basic Metabolic Panel:  Recent Labs Lab 06/23/16 0006  06/23/16 0801 06/23/16 1603 06/24/16 06/24/16 0727 06/25/16 0322 06/26/16 0228 06/27/16 0330 06/28/16 0234  NA  --   < >  --   --  142  --  140 140 139 140  K  --   < >  --   --  4.3  --  4.3 4.2 4.6 5.3*    CL  --   < >  --   --  97*  --  101 101 105 105  CO2  --   < >  --   --  34*  --  29 29 24 28   GLUCOSE  --   < >  --   --  118*  --  202* 76 199* 135*  BUN  --   < >  --   --  121*  --  108* 103* 96* 96*  CREATININE  --   < >  --   --  3.60*  --  3.79* 3.62* 3.67* 3.74*  CALCIUM  --   < >  --   --  8.0*  --  7.8* 8.0* 8.0* 8.3*  MG 2.1  --  2.0 2.1 2.1 2.0  --   --   --   --   PHOS 4.4  --  5.8* 5.9* 7.0* 7.0* 6.8* 5.9* 5.0*  --   < > = values in this interval not displayed. GFR: Estimated Creatinine Clearance: 12.2 mL/min (by C-G formula based on Cr of 3.74). Liver Function Tests:  Recent Labs Lab 06/25/16 0322 06/26/16 0228 06/27/16 0330  ALBUMIN 1.7* 1.6* 1.7*   No results for input(s): LIPASE, AMYLASE in the last 168 hours. No results for input(s): AMMONIA in the last 168 hours. Coagulation Profile:  Recent Labs Lab 06/27/16 1134  INR 1.06   Cardiac Enzymes: No results for input(s): CKTOTAL, CKMB, CKMBINDEX, TROPONINI in the last 168 hours. BNP (last 3 results) No results for input(s): PROBNP in the last 8760 hours. HbA1C: No results for input(s): HGBA1C in the last 72 hours. CBG:  Recent Labs Lab 06/27/16 1953 06/27/16 2032 06/28/16 0616 06/28/16 1026 06/28/16 1113  GLUCAP 60* 82 150* 296* 273*   Lipid Profile: No results for input(s): CHOL, HDL, LDLCALC, TRIG, CHOLHDL, LDLDIRECT in the last 72 hours. Thyroid Function Tests: No results for input(s): TSH, T4TOTAL, FREET4, T3FREE, THYROIDAB in the last 72 hours. Anemia Panel:  Recent Labs  06/28/16 0858  VITAMINB12 1372*  FOLATE 8.5  FERRITIN 1990*  TIBC 234*  IRON 162  RETICCTPCT 3.2*   Urine analysis:    Component Value Date/Time   COLORURINE AMBER* 06/13/2016 1615   COLORURINE Yellow 05/04/2014 0914   APPEARANCEUR TURBID* 06/13/2016 1615   APPEARANCEUR Clear 05/04/2014 0914   LABSPEC 1.021 06/13/2016 1615   LABSPEC 1.010 05/04/2014 0914   LABSPEC 1.020 02/14/2011 1654   PHURINE 5.0  06/13/2016 1615   PHURINE 5.0 05/04/2014 0914  PHURINE 6.0 02/14/2011 1654   GLUCOSEU 250* 06/13/2016 1615   GLUCOSEU >=500 05/04/2014 0914   HGBUR TRACE* 06/13/2016 1615   HGBUR 1+ 05/04/2014 0914   HGBUR Negative 02/14/2011 1654   HGBUR negative 05/17/2009 1334   BILIRUBINUR SMALL* 06/13/2016 1615   BILIRUBINUR Negative 05/04/2014 0914   BILIRUBINUR Negative 02/14/2011 1654   KETONESUR NEGATIVE 06/13/2016 1615   KETONESUR Negative 05/04/2014 0914   KETONESUR Negative 02/14/2011 1654   PROTEINUR NEGATIVE 06/13/2016 1615   PROTEINUR Negative 05/04/2014 0914   PROTEINUR Negative 02/14/2011 1654   UROBILINOGEN 0.2 09/17/2009 0233   NITRITE NEGATIVE 06/13/2016 1615   NITRITE Negative 05/04/2014 0914   NITRITE Negative 02/14/2011 1654   LEUKOCYTESUR LARGE* 06/13/2016 1615   LEUKOCYTESUR 2+ 05/04/2014 0914   LEUKOCYTESUR Trace 02/14/2011 1654   Sepsis Labs: @LABRCNTIP (procalcitonin:4,lacticidven:4)  )No results found for this or any previous visit (from the past 240 hour(s)).    Radiology Studies: No results found.   Scheduled Meds: . antiseptic oral rinse  7 mL Mouth Rinse q12n4p  . calcium acetate  1,334 mg Oral TID WC  . carbamide peroxide  5 drop Right Ear BID  . carvedilol  3.125 mg Oral BID WC  . chlorhexidine  15 mL Mouth Rinse BID  . cholecalciferol  5,000 Units Oral Daily  . [START ON 07/04/2016] clopidogrel  75 mg Oral QHS  . feeding supplement (GLUCERNA SHAKE)  237 mL Oral TID BM  . fenofibrate  160 mg Oral QHS  . FLUoxetine  20 mg Oral Daily  . gabapentin  200 mg Oral TID  . Gerhardt's butt cream   Topical BID  . heparin subcutaneous  5,000 Units Subcutaneous Q8H  . insulin aspart  0-20 Units Subcutaneous TID WC  . insulin aspart  0-5 Units Subcutaneous QHS  . insulin aspart  6 Units Subcutaneous TID WC  . insulin glargine  8 Units Subcutaneous Daily  . isosorbide mononitrate  60 mg Oral Daily  . loratadine  10 mg Oral Daily  . pantoprazole  40 mg Oral  BID  . simvastatin  20 mg Oral QHS  . topiramate  50 mg Oral BID   Continuous Infusions:     LOS: 15 days   Time Spent in minutes   30 minutes  Parag Dorton D.O. on 06/28/2016 at 11:32 AM  Between 7am to 7pm - Pager - [phone]  After 7pm go to [email or web address] - password [email or web address]  And look for the night coverage person covering for me after hours  [facility] Office  [phone]

## 2016-06-28 NOTE — Progress Notes (Signed)
[name] Progress Note   65 y.o. female with a PMHx of diabetes mellitus type 2, hyperlipidemia, MGUS, anemia chronic kidney disease, hypertension, coronary artery disease, GERD, chronic kidney disease stage IV BL Cr 1.9-2.2, depression, who was admitted to [facility] on 6/14 with hypotension and concern for sepsis.Renal function initially improved but then has had a consistent trend to a decline in function @ which time we were consulted on 6/26. Assessment/ Plan:   1. Acute Kidney Injury on CKD IV - patient already with advanced CKD IV even with a baseline creatinine of 1.9-2.2 based on her age and body mass. Fortunately renal function appears to have started to stabilize. - She has had a few hypotensive episodes but none sustained and did not receive contrast during this hospitalization. - Urine microscopy which I personally reviewed showed only a few pigmented casts, yeast, ~3 WBC/hpf and no RBC's. - Postvoid residual (65ml) and renal ultrasound --> no mention of thinning of the cortex or hyperehoic cortex.  - SFLC + SPEP w/ monoclonal spike and increased kappa and lambda lightchains; incr K/L ratio. - I am requesting a renal biopsy given that is not likely to be purely a prerenal azotemic state --> suspicious for an infiltrative disorder --> Plavix will need to be d/c for 5 days; therefore, biopsy scheduled for Mon. - Sending for serologic w/u + UPC as well.  2. Shock resolved with sepsis and negative urine + blood cultures now off Abx (had been treated with Levaquin --> Fortaz + Vancomycin completed on 6/25) 3. CASHD - recent MI before she received a renal biopsy at [facility]. 4. Hypertension - fairly well controlled. 5. MGUS - repeated SPEP and SFLC 6. Hyperphosphatemia - binders (increased to 2 tabs w/ each meal). 7. Acute encephalopathy - primary team appropriately adjusting per impaired renal function.  Subjective:   Feeling about the same as yesterday. Has intermittent  chest pain not associated with exertion. No f/c/n/v. Had CP 6/28 AM resolved with nitro x3. Weak and needs assistance to sit to eat. Some loose BM's as well. Denies dyspnea    Objective:   BP 132/76 mmHg  Pulse 90  Temp(Src) 98.7 F (37.1 C) (Oral)  Resp 18  Ht 5\' 3"  (1.6 m)  Wt 55.43 kg (122 lb 3.2 oz)  BMI 21.65 kg/m2  SpO2 93%  Intake/Output Summary (Last 24 hours) at 06/28/16 1209 Last data filed at 06/28/16 0835  Gross per 24 hour  Intake    827 ml  Output      0 ml  Net    827 ml   Weight change:   Physical Exam: General appearance: alert and cooperative Neck: no adenopathy, no carotid bruit, no JVD, supple, symmetrical, trachea midline and thyroid not enlarged, symmetric, no tenderness/mass/nodules Resp: clear to auscultation bilaterally w/ poor air movement Chest wall: no tenderness Cardio: regular rate and rhythm, S1, S2 normal, no murmur, click, rub or gallop GI: soft, non-tender; bowel sounds normal; no masses, no organomegaly Extremities: extremities normal, atraumatic, no cyanosis; tr edema Pulses: 2+ and symmetric Skin: Skin color, texture, turgor normal. No rashes or lesions  Imaging: No results found.  Labs: BMET  Recent Labs Lab 06/22/16 [id number]  06/23/16 0405 06/23/16 0801 06/23/16 1603 06/24/16 06/24/16 0727 06/25/16 0322 06/26/16 0228 06/27/16 0330 06/28/16 0234  NA 141  --  138  --   --  142  --  140 140 139 140  K 4.6  --  5.3*  --   --  4.3  --  4.3 4.2 4.6 5.3*  CL 97*  --  93*  --   --  97*  --  101 101 105 105  CO2 39*  --  31  --   --  34*  --  29 29 24 28   GLUCOSE 176*  --  354*  --   --  118*  --  202* 76 199* 135*  BUN 102*  --  117*  --   --  121*  --  108* 103* 96* 96*  CREATININE 2.94*  --  3.42*  --   --  3.60*  --  3.79* 3.62* 3.67* 3.74*  CALCIUM 9.0  --  8.8*  --   --  8.0*  --  7.8* 8.0* 8.0* 8.3*  PHOS  --   < >  --  5.8* 5.9* 7.0* 7.0* 6.8* 5.9* 5.0*  --   < > = values in this interval not  displayed. CBC  Recent Labs Lab 06/22/16 0312 06/28/16 0234  WBC 8.5 5.2  HGB 9.6* 7.2*  HCT 31.7* 22.8*  MCV 96.1 96.2  PLT 281 213    Medications:    . antiseptic oral rinse  7 mL Mouth Rinse q12n4p  . calcium acetate  1,334 mg Oral TID WC  . carbamide peroxide  5 drop Right Ear BID  . carvedilol  3.125 mg Oral BID WC  . chlorhexidine  15 mL Mouth Rinse BID  . cholecalciferol  5,000 Units Oral Daily  . [START ON 07/04/2016] clopidogrel  75 mg Oral QHS  . feeding supplement (GLUCERNA SHAKE)  237 mL Oral TID BM  . fenofibrate  160 mg Oral QHS  . FLUoxetine  20 mg Oral Daily  . gabapentin  200 mg Oral TID  . Gerhardt's butt cream   Topical BID  . insulin aspart  0-20 Units Subcutaneous TID WC  . insulin aspart  0-5 Units Subcutaneous QHS  . insulin aspart  6 Units Subcutaneous TID WC  . insulin glargine  8 Units Subcutaneous Daily  . isosorbide mononitrate  60 mg Oral Daily  . loratadine  10 mg Oral Daily  . pantoprazole  40 mg Oral BID  . simvastatin  20 mg Oral QHS  . topiramate  50 mg Oral BID      Otelia Santee, MD 06/28/2016, 12:09 PM

## 2016-06-28 NOTE — Progress Notes (Signed)
Patient refuses to wear hospital CPAP/BIPAP HS. No distress noted, patient also states she does not wear a unit at home. No distress noted.
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
## 2016-06-28 NOTE — Progress Notes (Signed)
Speech Language Pathology Treatment: Dysphagia  Patient Details Name: [name] MRN: [id number] DOB: Jan 09, 1951 Today's Date: 06/28/2016 Time: [id number] SLP Time Calculation (min) (ACUTE ONLY): 16 min  Assessment / Plan / Recommendation Clinical Impression  Skilled observation complete this am with pos. No overt indication of aspiration with thin liquids or solid textures when taken independently. RN provided meds, whole with liquid, with onset of belching and cough midway through intake suggestive of esophageal deficit. Coughing again subsided once med consumption ceased. Re-educated patient regarding use of chin tuck, pills one at a time, slow rate of intake, and upright posture. Overall, appears to be tolerating current diet and suspect that current function is close to baseline however in light of increased s/s of aspiration, will continue to f/u for education, tolerance, and need for instrumental testing prior to d/c.    HPI HPI: 65 year old female with PMH as below, which includes DM, CAD, HTN, and recent admission for R femoral neck fracture s/p arthoplasty 6/8. She had suffered several fall prior to the fall that caused the fracture. Post operative course was fairly uncomplicated with the exception of some mild anemia requiring PRBC transfusion and UTI treated with levaquin. She was discharged to [facility] for rehabilitation on 6/12. 6/14 she returned to ER with chief complaint of AMS with associated hypotension. In the emergency department she was noted to be lethargic and confused. She was complaining of abdominal pain. She was hypotensive and hypothermic with elevated WBC. Glucose noted to be markedly elevated, however urine ketones negative. With metabolic acidosis. Lactic acid wnl. Has had a cortrak.       SLP Plan  Continue with current plan of care     Recommendations  Diet recommendations: Regular;Thin liquid Liquids provided via: Cup;Straw Medication  Administration: Whole meds with liquid Supervision: Patient able to self feed;Intermittent supervision to cue for compensatory strategies Compensations: Slow rate;Small sips/bites Postural Changes and/or Swallow Maneuvers: Seated upright 90 degrees;Upright 30-60 min after meal             Oral Care Recommendations: Oral care BID Follow up Recommendations: Skilled Nursing facility Plan: Continue with current plan of care     Leslie Edmonson, [place] ([place] 06/28/2016, 10:24 AM

## 2016-06-29 LAB — BASIC METABOLIC PANEL
Anion gap: 7 (ref 5–15)
BUN: 93 mg/dL — AB (ref 6–20)
CALCIUM: 8.6 mg/dL — AB (ref 8.9–10.3)
CO2: 27 mmol/L (ref 22–32)
Chloride: 105 mmol/L (ref 101–111)
Creatinine, Ser: 3.78 mg/dL — ABNORMAL HIGH (ref 0.44–1.00)
GFR calc Af Amer: 13 mL/min — ABNORMAL LOW (ref >60)
GFR, EST NON AFRICAN AMERICAN: 11 mL/min — AB (ref >60)
GLUCOSE: 197 mg/dL — AB (ref 65–99)
Potassium: 5.4 mmol/L — ABNORMAL HIGH (ref 3.5–5.1)
Sodium: 139 mmol/L (ref 135–145)

## 2016-06-29 LAB — PREPARE RBC (CROSSMATCH)

## 2016-06-29 LAB — GLUCOSE, CAPILLARY
GLUCOSE-CAPILLARY: 419 mg/dL — AB (ref 65–99)
Glucose-Capillary: 296 mg/dL — ABNORMAL HIGH (ref 65–99)
Glucose-Capillary: 293 mg/dL — ABNORMAL HIGH (ref 65–99)
Glucose-Capillary: 282 mg/dL — ABNORMAL HIGH (ref 65–99)
Glucose-Capillary: 185 mg/dL — ABNORMAL HIGH (ref 65–99)

## 2016-06-29 LAB — CBC
HCT: 23.4 % — ABNORMAL LOW (ref 36.0–46.0)
Hemoglobin: 7.2 g/dL — ABNORMAL LOW (ref 12.0–15.0)
MCH: 30.5 pg (ref 26.0–34.0)
MCHC: 30.8 g/dL (ref 30.0–36.0)
MCV: 99.2 fL (ref 78.0–100.0)
PLATELETS: 199 10*3/uL (ref 150–400)
RBC: 2.36 MIL/uL — ABNORMAL LOW (ref 3.87–5.11)
RDW: 18 % — AB (ref 11.5–15.5)
WBC: 6.3 10*3/uL (ref 4.0–10.5)

## 2016-06-29 LAB — ANTI-DNA ANTIBODY, DOUBLE-STRANDED: DS DNA AB: 1 [IU]/mL (ref 0–9)

## 2016-06-29 LAB — C4 COMPLEMENT: COMPLEMENT C4, BODY FLUID: 19 mg/dL (ref 14–44)

## 2016-06-29 LAB — RHEUMATOID FACTOR: Rhuematoid fact SerPl-aCnc: 10.5 IU/mL (ref 0.0–13.9)

## 2016-06-29 LAB — C3 COMPLEMENT: C3 Complement: 98 mg/dL (ref 82–167)

## 2016-06-29 LAB — ANTINUCLEAR ANTIBODIES, IFA: ANA Ab, IFA: NEGATIVE

## 2016-06-29 IMAGING — CR DG CHEST 2V
1 series · 2 of 2 positions shown · non-contrast
Comparison: PA and lateral chest 09/10/2009.

CLINICAL DATA: Chest pain for the last few weeks. Worsening
shortness of breath.

EXAM:
CHEST  2 VIEW

[Series 1: dg chest 2 view · 0.14mm/px · 2 of 2 slices shown]
[im 1/2]
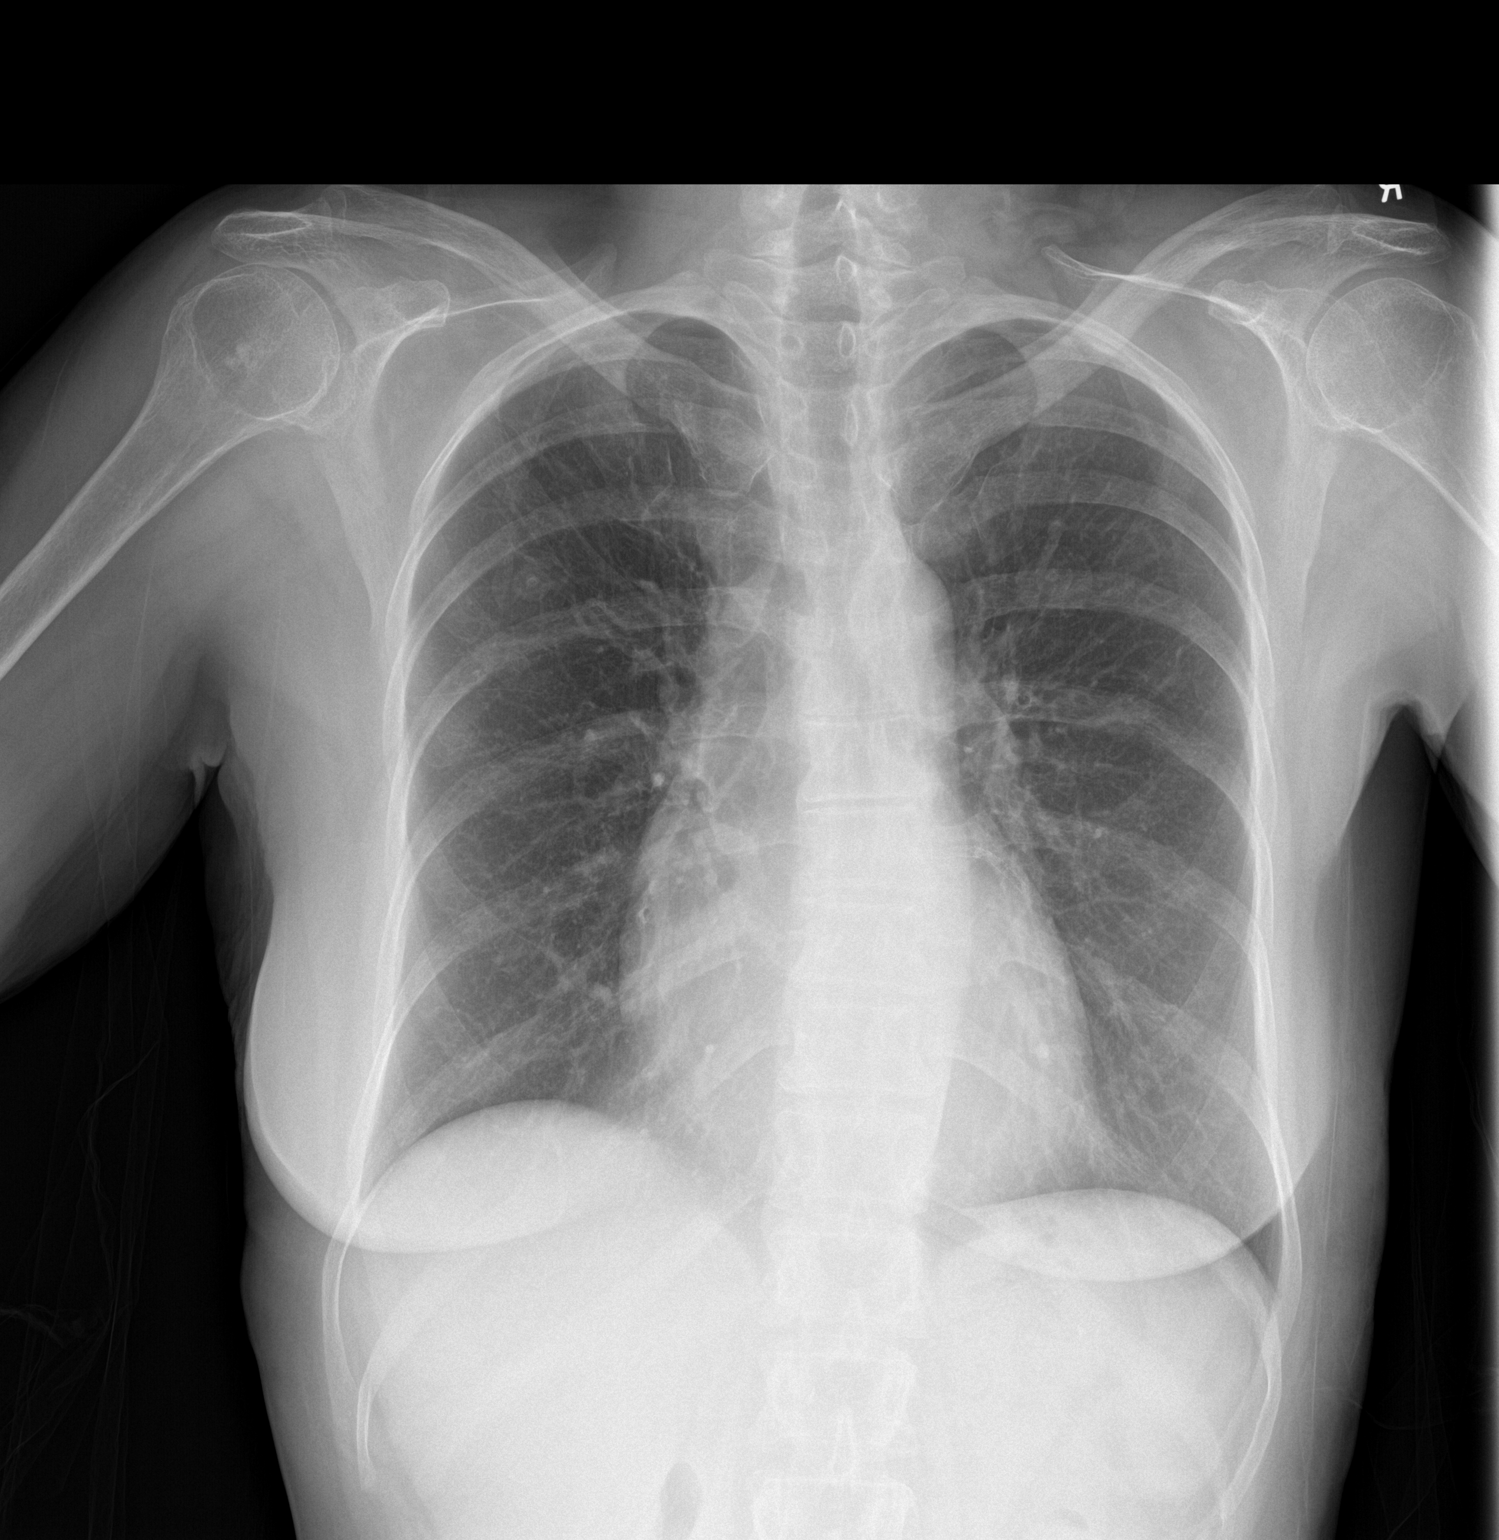
[im 2/2]
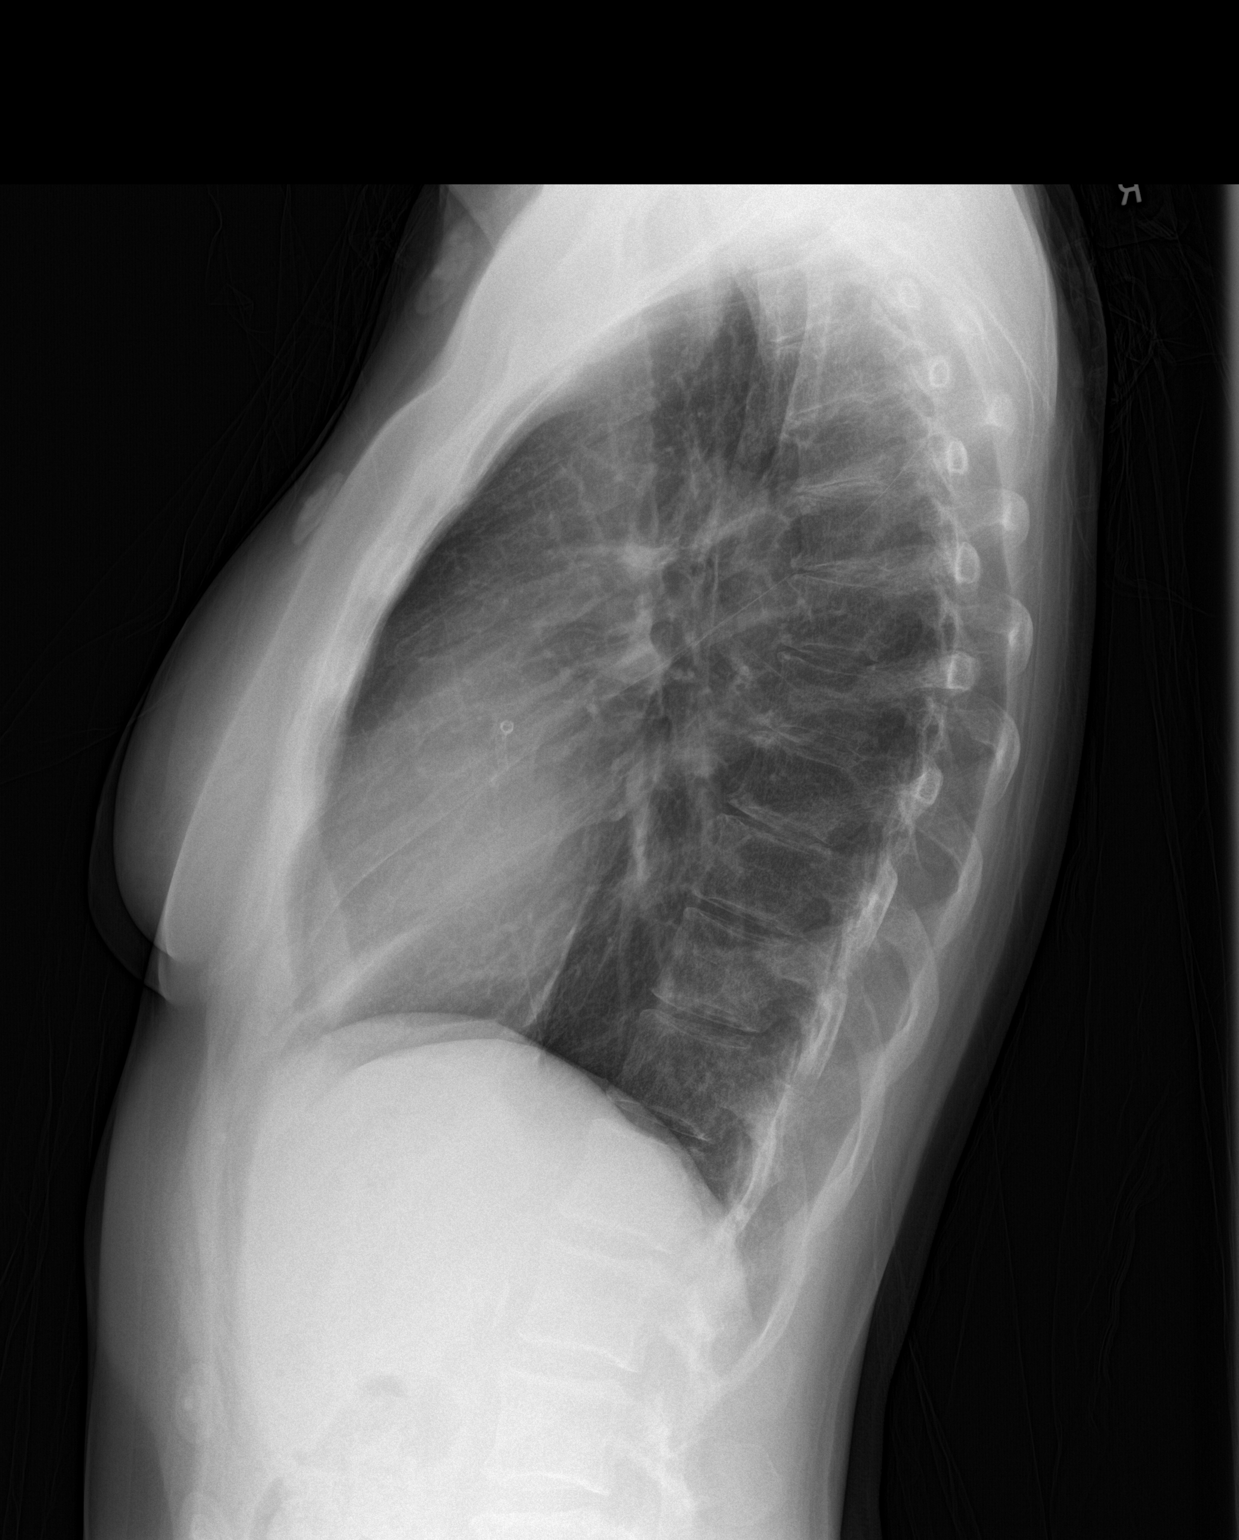

[2 of 2 positions shown; findings below may reference images not displayed]

FINDINGS: The lungs are clear. Heart size is normal. No pneumothorax or
pleural effusion. Coronary artery stent noted.
IMPRESSION: No acute disease.

## 2016-06-29 MED ORDER — SODIUM POLYSTYRENE SULFONATE 15 GM/60ML PO SUSP
15.0000 g | Freq: Once | ORAL | Status: AC
  Administered 2016-06-29: 15 g via ORAL
  Filled 2016-06-29 (×2): qty 60

## 2016-06-29 MED ORDER — FUROSEMIDE 10 MG/ML IJ SOLN
20.0000 mg | Freq: Once | INTRAMUSCULAR | Status: AC
  Administered 2016-07-02: 20 mg via INTRAVENOUS
  Filled 2016-06-29: qty 2

## 2016-06-29 MED ORDER — SODIUM CHLORIDE 0.9 % IV SOLN: Freq: Once | INTRAVENOUS | Status: DC

## 2016-06-29 MED ORDER — CLOPIDOGREL BISULFATE 75 MG PO TABS: 75.0000 mg | ORAL_TABLET | Freq: Every day | ORAL | Status: DC

## 2016-06-29 MED ORDER — FUROSEMIDE 10 MG/ML IJ SOLN
60.0000 mg | Freq: Once | INTRAMUSCULAR | Status: AC
  Administered 2016-06-29: 60 mg via INTRAVENOUS
  Filled 2016-06-29: qty 6

## 2016-06-29 NOTE — Progress Notes (Signed)
Utilization review completed.  

## 2016-06-29 NOTE — Progress Notes (Signed)
Patient placed on hospital black auto BIPAP machine with no complications, nurse aware.

## 2016-06-29 NOTE — Progress Notes (Signed)
[name] Progress Note    65 y.o. female with a PMHx of diabetes mellitus type 2, hyperlipidemia, MGUS, anemia chronic kidney disease, hypertension, coronary artery disease, GERD, chronic kidney disease stage IV BL Cr 1.9-2.2, depression, who was admitted to [facility] on 6/14 with hypotension and concern for sepsis.Renal function initially improved but then has had a consistent trend to a decline in function @ which time we were consulted on 6/26. Assessment/ Plan:   1. Acute Kidney Injury on CKD IV - patient already with advanced CKD IV even with a baseline creatinine of 1.9-2.2 based on her age and body mass. Fortunately renal function appears to have started to stabilize. - She has had a few hypotensive episodes but none sustained and did not receive contrast during this hospitalization. - Urine microscopy which I personally reviewed showed only a few pigmented casts, yeast, ~3 WBC/hpf and no RBC's. - Postvoid residual (62ml) and renal ultrasound --> no mention of thinning of the cortex or hyperehoic cortex.  --> will repeat post void (she states some fullness but questionable reliability)  - SFLC + SPEP w/ monoclonal spike and increased kappa and lambda lightchains; incr K/L ratio. - I am requesting a renal biopsy given that is not likely to be purely a prerenal azotemic state --> suspicious for an infiltrative disorder --> Plavix will need to be d/c for 5 days; therefore, biopsy scheduled for Mon. - Sending for serologic w/u + UPC as well --> RF, ANA neg with Nl c3c4. Urine has not been collected for a UPC. - Will give Lasix 60mg  IVx1 which should help with mild hyperkalemia as well. - Floor scale weights daily requested today.  2. Shock resolved with sepsis and negative urine + blood cultures now off Abx (had been treated with Levaquin --> Fortaz + Vancomycin completed on 6/25) 3. CASHD - recent MI before she received a renal biopsy at [facility]. 4. Hypertension - fairly well  controlled. 5. MGUS - repeated SPEP and SFLC 6. Hyperphosphatemia - binders (increased to 2 tabs w/ each meal). 7. Acute encephalopathy - primary team appropriately adjusting per impaired renal function.  Subjective:   Feeling about the same as yesterday. Has intermittent chest pain not associated with exertion. No f/c/n/v. Had CP 6/28 AM resolved with nitro x3. Weak and needs assistance to sit to eat. Continues to have loose BM's as well. Denies dyspnea   Objective:   BP 123/55 mmHg  Pulse 98  Temp(Src) 98.4 F (36.9 C) (Oral)  Resp 18  Ht 5\' 3"  (1.6 m)  Wt 55.43 kg (122 lb 3.2 oz)  BMI 21.65 kg/m2  SpO2 95%  Intake/Output Summary (Last 24 hours) at 06/29/16 1030 Last data filed at 06/29/16 0550  Gross per 24 hour  Intake    480 ml  Output      2 ml  Net    478 ml   Weight change:   Physical Exam: General appearance: alert and cooperative Neck: no adenopathy, no carotid bruit,  Resp: clear to auscultation bilaterally w/ poor air movement Cardio: regular rate and rhythm, S1, S2 normal, no murmur, click, rub or gallop GI: soft, non-tender; bowel sounds normal; no masses, no organomegaly Extremities: extremities normal, atraumatic, no cyanosis; tr-1+ edema, upper extr as well w/ edema Pulses: 2+ and symmetric Skin: Skin color, texture, turgor normal. No rashes or lesions GU: no foley  Imaging: No results found.  Labs: BMET  Recent Labs Lab 06/23/16 0405 06/23/16 0801 06/23/16 1603 06/24/16 06/24/16 [id number] 06/25/16  [id number] 06/26/16 0228 06/27/16 0330 06/28/16 0234 06/29/16 0239  NA 138  --   --  142  --  140 140 139 140 139  K 5.3*  --   --  4.3  --  4.3 4.2 4.6 5.3* 5.4*  CL 93*  --   --  97*  --  101 101 105 105 105  CO2 31  --   --  34*  --  29 29 24 28 27   GLUCOSE 354*  --   --  118*  --  202* 76 199* 135* 197*  BUN 117*  --   --  121*  --  108* 103* 96* 96* 93*  CREATININE 3.42*  --   --  3.60*  --  3.79* 3.62* 3.67* 3.74* 3.78*  CALCIUM 8.8*  --    --  8.0*  --  7.8* 8.0* 8.0* 8.3* 8.6*  PHOS  --  5.8* 5.9* 7.0* 7.0* 6.8* 5.9* 5.0*  --   --    CBC  Recent Labs Lab 06/28/16 0234 06/29/16 0239  WBC 5.2 6.3  HGB 7.2* 7.2*  HCT 22.8* 23.4*  MCV 96.2 99.2  PLT 213 199    Medications:    . antiseptic oral rinse  7 mL Mouth Rinse q12n4p  . calcium acetate  1,334 mg Oral TID WC  . carbamide peroxide  5 drop Right Ear BID  . carvedilol  3.125 mg Oral BID WC  . chlorhexidine  15 mL Mouth Rinse BID  . cholecalciferol  5,000 Units Oral Daily  . [START ON 07/04/2016] clopidogrel  75 mg Oral QHS  . feeding supplement (GLUCERNA SHAKE)  237 mL Oral TID BM  . fenofibrate  160 mg Oral QHS  . FLUoxetine  20 mg Oral Daily  . furosemide  60 mg Intravenous Once  . gabapentin  200 mg Oral TID  . Gerhardt's butt cream   Topical BID  . insulin aspart  0-20 Units Subcutaneous TID WC  . insulin aspart  0-5 Units Subcutaneous QHS  . insulin aspart  6 Units Subcutaneous TID WC  . insulin glargine  8 Units Subcutaneous Daily  . isosorbide mononitrate  60 mg Oral Daily  . loratadine  10 mg Oral Daily  . pantoprazole  40 mg Oral BID  . simvastatin  20 mg Oral QHS  . sodium polystyrene  15 g Oral Once  . topiramate  50 mg Oral BID      Otelia Santee, MD 06/29/2016, 10:30 AM

## 2016-06-29 NOTE — Progress Notes (Signed)
Speech Language Pathology Treatment: Dysphagia  Patient Details Name: [name] MRN: [id number] DOB: December 06, 1951 Today's Date: 06/29/2016 Time: [id number] SLP Time Calculation (min) (ACUTE ONLY): 23 min  Assessment / Plan / Recommendation Clinical Impression  Pt has been followed for acute dysphagia - now tolerating a regular diet, thin liquids.  Has demonstrated continuous improvement as sepsis/encephalopathy has resolved.  No overt s/s of dysphagia nor aspiration today during lunch. Self-feeding independently; using chin tuck as needed (pt has been carrying out this strategy since 2010 prn).  Consumed large, successive boluses of water today without chin tuck and there were no overt signs of aspiration. Lungs are CTA.  Pt with improved mood and appetite.  No further needs identified - SLP services will sign off.   HPI HPI: 65 year old female with PMH as below, which includes DM, CAD, HTN, and recent admission for R femoral neck fracture s/p arthoplasty 6/8. She had suffered several fall prior to the fall that caused the fracture. Post operative course was fairly uncomplicated with the exception of some mild anemia requiring PRBC transfusion and UTI treated with levaquin. She was discharged to [facility] for rehabilitation on 6/12. 6/14 she returned to ER with chief complaint of AMS with associated hypotension. In the emergency department she was noted to be lethargic and confused. She was complaining of abdominal pain. She was hypotensive and hypothermic with elevated WBC. Glucose noted to be markedly elevated, however urine ketones negative. With metabolic acidosis. Lactic acid wnl. Has had a cortrak.       SLP Plan  All goals met     Recommendations  Diet recommendations: Regular;Thin liquid Liquids provided via: Cup;Straw Medication Administration: Whole meds with liquid Supervision: Patient able to self feed Compensations: Chin tuck (chin tuck prn per pt's  discretion) Postural Changes and/or Swallow Maneuvers: Seated upright 90 degrees;Upright 30-60 min after meal             Oral Care Recommendations: Oral care BID Plan: All goals met     GO              [name] L. Tivis Ringer, [place] CCC/SLP Pager [phone]   [name] 06/29/2016, 12:06 PM

## 2016-06-29 NOTE — Progress Notes (Signed)
Pt. Refused bipap. 

## 2016-06-29 NOTE — Care Management Important Message (Signed)
Important Message  Patient Details  Name: TRANIA LEHL MRN: [id number] Date of Birth: 1951-01-26   Medicare Important Message Given:  Yes    Terianna Peggs Abena 06/29/2016, 11:15 AM

## 2016-06-29 NOTE — Progress Notes (Signed)
PROGRESS NOTE    [name]  [id number] DOB: [birth year] DOA: 06/13/2016 PCP: [name], MD   Brief Narrative:  HPI on 06/13/2016 by Dr. Cain Saupe (ICU) 65 year old female with PMH as below, which includes DM, CAD, HTN, and recent admission for R femoral neck fracture s/p arthoplasty 6/8. She had suffered several fall prior to the fall that caused the fracture. Post operative course was fairly uncomplicated with the exception of some mild anemia requiring PRBC transfusion and UTI treated with levaquin. She was discharged to [facility] for rehabilitation on 6/12. 6/14 she returned to ER with chief complaint of AMS with associated hypotension. In the emergency department she was noted to be lethargic and confused. She was complaining of abdominal pain. She was hypotensive and hypothermic with elevated WBC. Glucose noted to be markedly elevated, however urine ketones negative. With metabolic acidosis. Lactic acid wnl. Due to persistent hypotension and concern for septic shock PCCM asked to admit.   Interim history Treated for DKA, hypotension, and altered mental status. Found to have AKI.  Nephrology consulted.   Assessment & Plan   Acute respiratory failure with hypoxemia (HCC) secondary to pulmonary edema with possible left base HCAP -Continue supplemental oxygen and BiPAP at bedtime.  -Continue pulmonary hygiene and oxygen to maintain saturations 88-92 percent.  -Levaquin discontinued 06/24/16. -Very deconditioned with prolonged hospital stay.  -CIR consulted and felt patient was not a candidate for inpatient rehab -likely will return to SNF  AKI (acute kidney injury) (HCC)/stage III-IV chronic kidney disease -Baseline creatinine 1.9-2.0. -Current creatinine remains elevated, today 3.74 -Placed on IVF. -Nephrology consulted and appreciated.  -Renal ultrasound on 06/25/16, no hydronephrosis.  -Will hold IVF today as patient does have LE edema -SFLC and SPEP + with  monoclonal spike and increase L lambda light chains. -renal biopsy ordered- pending for 07/02/2016 (needs to be off of Plavix for 5 days)  Shock secondary to hypovolemia and probable sepsis -Blood and urine cultures negative.  -Strep pneumonia and legionella urine antigens negative -Antibiotics narrowed to Levaquin status post 8 days of therapy with Tressie Ellis and vancomycin.  -Repeat chest x-ray showed an interval decrease in bilateral diffuse airspace disease. -WBC now normalized.  -Antibiotics discontinued on 06/24/16.  CAD (coronary artery disease)/demand ischemia -Continue Coreg, labetalol, Imdur, fenofibrate, Zocor and Plavix.  Diabetic ketoacidosis without coma associated with type 2 diabetes mellitus (HCC)/metabolic acidosis -Resolved.  -Continue ISS and lantus with CBG monitoring   Hypertension -Continue Coreg and labetalol.  Hyperlipidemia -Continue fenofibrate and Zocor.  Possible pancreatitis/elevated lipase -Diet has now been advanced. NG tube removed. Poor appetite.  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  -Platelets normalized.  -Repeat SPEP/SFLC as above -hemoglobin 7.2 today, will give 1uPRBC -pending FOBT  -Anemia panel: adequate iron and iron stores -Continue to monitor CBC -Will hold heparin  Acute encephalopathy -Improving, more alert today -Neurontin & Topamax dose adjusted given impaired renal function.   Hypo/hyperphosphatemia -Initially repleted, now high. Likely reflective of progressive renal failure.  -Continue PhosLo, improving.  Hyperkalemia -potassium mildly elevated to 5.4 today -Will give dose of kayexalate  -Continue to monitor   Anxiety -Ativan ordered as needed Q 8 hours. Continue Prozac.  DVT Prophylaxis  Heparin--> SCDs  Code Status: Full  Family Communication: None at bedside  Disposition Plan: Admitted. Pending renal biopsy, monitor  hemoglobin  Consultants Nephrology PCCM Inpatient Rehab Interventional raidiology   Procedures  TTE (06/24/15): EF 55-60%. LA & RA normal in  size. RV normal in size and function. No aortic stenosis or regurgitation. Mild mitral regurgitation. Mild tricuspid regurgitation. Port CXR 6/14: Bilateral hilar fullness and lower lobe opacification with silhouetting of the left hemidiaphragm. Port Abd X-ray 6/14: No free air under diaphragm. Right hip hemiarthroplasty. Bowel gas pattern nonobstructive.  Antibiotics   Anti-infectives    Start     Dose/Rate Route Frequency Ordered Stop   06/23/16 1630  levofloxacin (LEVAQUIN) IVPB 500 mg  Status:  Discontinued     500 mg 100 mL/hr over 60 Minutes Intravenous Every 48 hours 06/21/16 1043 06/24/16 0958   06/21/16 1630  levofloxacin (LEVAQUIN) IVPB 750 mg     750 mg 100 mL/hr over 90 Minutes Intravenous  Once 06/21/16 1043 06/21/16 1720   06/18/16 1700  cefTAZidime (FORTAZ) 1 g in dextrose 5 % 50 mL IVPB  Status:  Discontinued     1 g 100 mL/hr over 30 Minutes Intravenous Every 24 hours 06/17/16 1728 06/21/16 1032   06/18/16 1200  vancomycin (VANCOCIN) IVPB 750 mg/150 ml premix  Status:  Discontinued     750 mg 150 mL/hr over 60 Minutes Intravenous Every 48 hours 06/17/16 1728 06/21/16 1032   06/14/16 1630  vancomycin (VANCOCIN) 500 mg in sodium chloride 0.9 % 100 mL IVPB  Status:  Discontinued     500 mg 100 mL/hr over 60 Minutes Intravenous Every 24 hours 06/13/16 1630 06/17/16 1702   06/13/16 1645  cefTAZidime (FORTAZ) 2 g in dextrose 5 % 50 mL IVPB  Status:  Discontinued     2 g 100 mL/hr over 30 Minutes Intravenous Every 24 hours 06/13/16 1630 06/17/16 1728   06/13/16 1645  vancomycin (VANCOCIN) IVPB 1000 mg/200 mL premix  Status:  Discontinued     1,000 mg 200 mL/hr over 60 Minutes Intravenous  Once 06/13/16 1630 06/13/16 1631   06/13/16 1630  levofloxacin (LEVAQUIN) IVPB 750 mg  Status:  Discontinued     750 mg 100 mL/hr over 90  Minutes Intravenous  Once 06/13/16 1617 06/13/16 1624   06/13/16 1630  aztreonam (AZACTAM) 2 g in dextrose 5 % 50 mL IVPB  Status:  Discontinued     2 g 100 mL/hr over 30 Minutes Intravenous  Once 06/13/16 1617 06/13/16 1624   06/13/16 1630  vancomycin (VANCOCIN) IVPB 1000 mg/200 mL premix     1,000 mg 200 mL/hr over 60 Minutes Intravenous  Once 06/13/16 1617 06/13/16 2105      Subjective:   [name] seen and examined today.  Patient states she is not feeling well.  She denies shortness of breath, abdominal pain, headache, dizziness.   Objective:   Filed Vitals:   06/28/16 0251 06/28/16 1337 06/28/16 2042 06/29/16 0550  BP: 132/76 135/65 132/59 123/55  Pulse: 90 91 90 98  Temp: 98.7 F (37.1 C) 98.5 F (36.9 C) 98.9 F (37.2 C) 98.4 F (36.9 C)  TempSrc: Oral Oral Oral Oral  Resp: 18 18 18 18   Height:      Weight:      SpO2: 93% 94% 96% 95%    Intake/Output Summary (Last 24 hours) at 06/29/16 1130 Last data filed at 06/29/16 0800  Gross per 24 hour  Intake    720 ml  Output      2 ml  Net    718 ml   Filed Weights   06/23/16 0500 06/25/16 0207 06/27/16 0430  Weight: 48.2 kg (106 lb 4.2 oz) 55.8 kg (123 lb 0.3 oz) 55.43 kg (122  lb 3.2 oz)    Exam  General: Well developed,elderly, NAD  HEENT: NCAT,  mucous membranes moist.   Cardiovascular: S1 S2 auscultated, no murmurs, RRR  Respiratory: Clear to auscultation  Abdomen: Soft, nontender, nondistended, + bowel sounds  Extremities: warm dry without cyanosis clubbing. +LE edema  Neuro: AAOx3, nonfocal  Psych: Anxious but appropriate   Data Reviewed: I have personally reviewed following labs and imaging studies  CBC:  Recent Labs Lab 06/28/16 0234 06/29/16 0239  WBC 5.2 6.3  HGB 7.2* 7.2*  HCT 22.8* 23.4*  MCV 96.2 99.2  PLT 213 [id number]   Basic Metabolic Panel:  Recent Labs Lab 06/23/16 0006  06/23/16 0801 06/23/16 1603 06/24/16 06/24/16 0727 06/25/16 0322 06/26/16 0228 06/27/16 0330  06/28/16 0234 06/29/16 0239  NA  --   < >  --   --  142  --  140 140 139 140 139  K  --   < >  --   --  4.3  --  4.3 4.2 4.6 5.3* 5.4*  CL  --   < >  --   --  97*  --  101 101 105 105 105  CO2  --   < >  --   --  34*  --  29 29 24 28 27   GLUCOSE  --   < >  --   --  118*  --  202* 76 199* 135* 197*  BUN  --   < >  --   --  121*  --  108* 103* 96* 96* 93*  CREATININE  --   < >  --   --  3.60*  --  3.79* 3.62* 3.67* 3.74* 3.78*  CALCIUM  --   < >  --   --  8.0*  --  7.8* 8.0* 8.0* 8.3* 8.6*  MG 2.1  --  2.0 2.1 2.1 2.0  --   --   --   --   --   PHOS 4.4  --  5.8* 5.9* 7.0* 7.0* 6.8* 5.9* 5.0*  --   --   < > = values in this interval not displayed. GFR: Estimated Creatinine Clearance: 12.1 mL/min (by C-G formula based on Cr of 3.78). Liver Function Tests:  Recent Labs Lab 06/25/16 0322 06/26/16 0228 06/27/16 0330  ALBUMIN 1.7* 1.6* 1.7*   No results for input(s): LIPASE, AMYLASE in the last 168 hours. No results for input(s): AMMONIA in the last 168 hours. Coagulation Profile:  Recent Labs Lab 06/27/16 1134  INR 1.06   Cardiac Enzymes: No results for input(s): CKTOTAL, CKMB, CKMBINDEX, TROPONINI in the last 168 hours. BNP (last 3 results) No results for input(s): PROBNP in the last 8760 hours. HbA1C: No results for input(s): HGBA1C in the last 72 hours. CBG:  Recent Labs Lab 06/28/16 1632 06/28/16 1804 06/28/16 2039 06/29/16 0652 06/29/16 1108  GLUCAP 225* 248* 181* 296* 293*   Lipid Profile: No results for input(s): CHOL, HDL, LDLCALC, TRIG, CHOLHDL, LDLDIRECT in the last 72 hours. Thyroid Function Tests: No results for input(s): TSH, T4TOTAL, FREET4, T3FREE, THYROIDAB in the last 72 hours. Anemia Panel:  Recent Labs  06/28/16 0858  VITAMINB12 1372*  FOLATE 8.5  FERRITIN 1990*  TIBC 234*  IRON 162  RETICCTPCT 3.2*   Urine analysis:    Component Value Date/Time   COLORURINE AMBER* 06/13/2016 1615   COLORURINE Yellow 05/04/2014 0914   APPEARANCEUR  TURBID* 06/13/2016 1615   APPEARANCEUR Clear 05/04/2014 0914   LABSPEC  1.021 06/13/2016 1615   LABSPEC 1.010 05/04/2014 0914   LABSPEC 1.020 02/14/2011 1654   PHURINE 5.0 06/13/2016 1615   PHURINE 5.0 05/04/2014 0914   PHURINE 6.0 02/14/2011 1654   GLUCOSEU 250* 06/13/2016 1615   GLUCOSEU >=500 05/04/2014 0914   HGBUR TRACE* 06/13/2016 1615   HGBUR 1+ 05/04/2014 0914   HGBUR Negative 02/14/2011 1654   HGBUR negative 05/17/2009 1334   BILIRUBINUR SMALL* 06/13/2016 1615   BILIRUBINUR Negative 05/04/2014 0914   BILIRUBINUR Negative 02/14/2011 1654   KETONESUR NEGATIVE 06/13/2016 1615   KETONESUR Negative 05/04/2014 0914   KETONESUR Negative 02/14/2011 1654   PROTEINUR NEGATIVE 06/13/2016 1615   PROTEINUR Negative 05/04/2014 0914   PROTEINUR Negative 02/14/2011 1654   UROBILINOGEN 0.2 09/17/2009 0233   NITRITE NEGATIVE 06/13/2016 1615   NITRITE Negative 05/04/2014 0914   NITRITE Negative 02/14/2011 1654   LEUKOCYTESUR LARGE* 06/13/2016 1615   LEUKOCYTESUR 2+ 05/04/2014 0914   LEUKOCYTESUR Trace 02/14/2011 1654   Sepsis Labs: @LABRCNTIP (procalcitonin:4,lacticidven:4)  )No results found for this or any previous visit (from the past 240 hour(s)).    Radiology Studies: No results found.   Scheduled Meds: . antiseptic oral rinse  7 mL Mouth Rinse q12n4p  . calcium acetate  1,334 mg Oral TID WC  . carbamide peroxide  5 drop Right Ear BID  . carvedilol  3.125 mg Oral BID WC  . chlorhexidine  15 mL Mouth Rinse BID  . cholecalciferol  5,000 Units Oral Daily  . [START ON 07/04/2016] clopidogrel  75 mg Oral QHS  . feeding supplement (GLUCERNA SHAKE)  237 mL Oral TID BM  . fenofibrate  160 mg Oral QHS  . FLUoxetine  20 mg Oral Daily  . gabapentin  200 mg Oral TID  . Gerhardt's butt cream   Topical BID  . insulin aspart  0-20 Units Subcutaneous TID WC  . insulin aspart  0-5 Units Subcutaneous QHS  . insulin aspart  6 Units Subcutaneous TID WC  . insulin glargine  8 Units  Subcutaneous Daily  . isosorbide mononitrate  60 mg Oral Daily  . loratadine  10 mg Oral Daily  . pantoprazole  40 mg Oral BID  . simvastatin  20 mg Oral QHS  . sodium polystyrene  15 g Oral Once  . topiramate  50 mg Oral BID   Continuous Infusions:     LOS: 16 days   Time Spent in minutes   30 minutes  Valincia Touch D.O. on 06/29/2016 at 11:30 AM  Between 7am to 7pm - Pager - [phone]  After 7pm go to [email or web address] - password [email or web address]  And look for the night coverage person covering for me after hours  [facility] Office  [phone]

## 2016-06-30 LAB — TYPE AND SCREEN
ABO/RH(D): O POS
Antibody Screen: NEGATIVE
Unit division: 0

## 2016-06-30 LAB — CBC
HCT: 26.4 % — ABNORMAL LOW (ref 36.0–46.0)
Hemoglobin: 8.3 g/dL — ABNORMAL LOW (ref 12.0–15.0)
MCH: 29.5 pg (ref 26.0–34.0)
MCHC: 31.4 g/dL (ref 30.0–36.0)
MCV: 94 fL (ref 78.0–100.0)
Platelets: 218 10*3/uL (ref 150–400)
RBC: 2.81 MIL/uL — ABNORMAL LOW (ref 3.87–5.11)
RDW: 19.1 % — ABNORMAL HIGH (ref 11.5–15.5)
WBC: 5.9 10*3/uL (ref 4.0–10.5)

## 2016-06-30 LAB — BASIC METABOLIC PANEL
Anion gap: 7 (ref 5–15)
BUN: 94 mg/dL — ABNORMAL HIGH (ref 6–20)
CO2: 28 mmol/L (ref 22–32)
Calcium: 8.8 mg/dL — ABNORMAL LOW (ref 8.9–10.3)
Chloride: 104 mmol/L (ref 101–111)
Creatinine, Ser: 3.79 mg/dL — ABNORMAL HIGH (ref 0.44–1.00)
GFR calc Af Amer: 13 mL/min — ABNORMAL LOW (ref >60)
GFR calc non Af Amer: 11 mL/min — ABNORMAL LOW (ref >60)
Glucose, Bld: 175 mg/dL — ABNORMAL HIGH (ref 65–99)
Potassium: 5.4 mmol/L — ABNORMAL HIGH (ref 3.5–5.1)
Sodium: 139 mmol/L (ref 135–145)

## 2016-06-30 LAB — GLUCOSE, CAPILLARY
GLUCOSE-CAPILLARY: 195 mg/dL — AB (ref 65–99)
GLUCOSE-CAPILLARY: 136 mg/dL — AB (ref 65–99)
Glucose-Capillary: 75 mg/dL (ref 65–99)
Glucose-Capillary: 85 mg/dL (ref 65–99)

## 2016-06-30 LAB — OCCULT BLOOD X 1 CARD TO LAB, STOOL: Fecal Occult Bld: NEGATIVE

## 2016-06-30 LAB — PROTEIN / CREATININE RATIO, URINE
CREATININE, URINE: 47.58 mg/dL
PROTEIN CREATININE RATIO: 0.25 mg/mg{creat} — AB (ref 0.00–0.15)
TOTAL PROTEIN, URINE: 12 mg/dL

## 2016-06-30 MED ORDER — SODIUM POLYSTYRENE SULFONATE 15 GM/60ML PO SUSP
30.0000 g | Freq: Once | ORAL | Status: AC
  Administered 2016-06-30: 30 g via ORAL
  Filled 2016-06-30: qty 120

## 2016-06-30 NOTE — Progress Notes (Signed)
Patient refused CPAP tonight. There Is a machine in the room at this time. RN aware. Explained to Patient that if they changed their mind, to just have the RN call Respiratory and we would come set them up. 

## 2016-06-30 NOTE — Progress Notes (Signed)
PROGRESS NOTE    [name]  [id number] DOB: March 31, 1951 DOA: 06/13/2016 PCP: [name], MD   Brief Narrative:  HPI on 06/13/2016 by Dr. Cain Saupe (ICU) 65 year old female with PMH as below, which includes DM, CAD, HTN, and recent admission for R femoral neck fracture s/p arthoplasty 6/8. She had suffered several fall prior to the fall that caused the fracture. Post operative course was fairly uncomplicated with the exception of some mild anemia requiring PRBC transfusion and UTI treated with levaquin. She was discharged to [facility] for rehabilitation on 6/12. 6/14 she returned to ER with chief complaint of AMS with associated hypotension. In the emergency department she was noted to be lethargic and confused. She was complaining of abdominal pain. She was hypotensive and hypothermic with elevated WBC. Glucose noted to be markedly elevated, however urine ketones negative. With metabolic acidosis. Lactic acid wnl. Due to persistent hypotension and concern for septic shock PCCM asked to admit.   Interim history Treated for DKA, hypotension, and altered mental status. Found to have AKI.  Nephrology consulted.   Assessment & Plan   Acute respiratory failure with hypoxemia (HCC) secondary to pulmonary edema with possible left base HCAP -Continue supplemental oxygen and BiPAP at bedtime.  -Continue pulmonary hygiene and oxygen to maintain saturations 88-92 percent.  -Levaquin discontinued 06/24/16. -Very deconditioned with prolonged hospital stay.  -CIR consulted and felt patient was not a candidate for inpatient rehab -likely will return to SNF  AKI (acute kidney injury) (HCC)/stage III-IV chronic kidney disease -Baseline creatinine 1.9-2.0. -Current creatinine remains elevated, today 3.79 -Nephrology consulted and appreciated.  -Renal ultrasound on 06/25/16, no hydronephrosis.  -Will hold IVF today as patient does have LE edema -SFLC and SPEP + with monoclonal spike  and increase L lambda light chains. -renal biopsy ordered- pending for 07/02/2016 (needs to be off of Plavix for 5 days)  Shock secondary to hypovolemia and probable sepsis -Blood and urine cultures negative.  -Strep pneumonia and legionella urine antigens negative -Antibiotics narrowed to Levaquin status post 8 days of therapy with Tressie Ellis and vancomycin.  -Repeat chest x-ray showed an interval decrease in bilateral diffuse airspace disease. -WBC now normalized.  -Antibiotics discontinued on 06/24/16.  CAD (coronary artery disease)/demand ischemia -Continue Coreg, labetalol, Imdur, fenofibrate, Zocor and Plavix.  Diabetic ketoacidosis without coma associated with type 2 diabetes mellitus (HCC)/metabolic acidosis -Resolved.  -Continue ISS and lantus with CBG monitoring   Hypertension -Continue Coreg and labetalol.  Hyperlipidemia -Continue fenofibrate and Zocor.  Possible pancreatitis/elevated lipase -Diet has now been advanced. NG tube removed. Poor appetite.  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  -Platelets normalized.  -Repeat SPEP/SFLC as above -hemoglobin 8.3 today, received 1u PRBC on 06/29/2016 -pending FOBT  -Anemia panel: adequate iron and iron stores -Continue to monitor CBC -Heparin held  Acute encephalopathy -Back to baseline -Neurontin & Topamax dose adjusted given impaired renal function.   Hypo/hyperphosphatemia -Initially repleted, now high. Likely reflective of progressive renal failure.  -Continue PhosLo, improving.  Hyperkalemia -potassium mildly elevated to 5.4 today -Patient refused kayexalate on 6/30.  Will order -Continue to monitor   Anxiety -Ativan ordered as needed Q 8 hours. Continue Prozac.  DVT Prophylaxis  Heparin--> SCDs  Code Status: Full  Family Communication: None at bedside  Disposition Plan: Admitted. Pending renal biopsy, monitor  hemoglobin  Consultants Nephrology PCCM Inpatient Rehab Interventional raidiology   Procedures  TTE (06/24/15): EF 55-60%. LA & RA normal in size.  RV normal in size and function. No aortic stenosis or regurgitation. Mild mitral regurgitation. Mild tricuspid regurgitation. Port CXR 6/14: Bilateral hilar fullness and lower lobe opacification with silhouetting of the left hemidiaphragm. Port Abd X-ray 6/14: No free air under diaphragm. Right hip hemiarthroplasty. Bowel gas pattern nonobstructive.  Antibiotics   Anti-infectives    Start     Dose/Rate Route Frequency Ordered Stop   06/23/16 1630  levofloxacin (LEVAQUIN) IVPB 500 mg  Status:  Discontinued     500 mg 100 mL/hr over 60 Minutes Intravenous Every 48 hours 06/21/16 1043 06/24/16 0958   06/21/16 1630  levofloxacin (LEVAQUIN) IVPB 750 mg     750 mg 100 mL/hr over 90 Minutes Intravenous  Once 06/21/16 1043 06/21/16 1720   06/18/16 1700  cefTAZidime (FORTAZ) 1 g in dextrose 5 % 50 mL IVPB  Status:  Discontinued     1 g 100 mL/hr over 30 Minutes Intravenous Every 24 hours 06/17/16 1728 06/21/16 1032   06/18/16 1200  vancomycin (VANCOCIN) IVPB 750 mg/150 ml premix  Status:  Discontinued     750 mg 150 mL/hr over 60 Minutes Intravenous Every 48 hours 06/17/16 1728 06/21/16 1032   06/14/16 1630  vancomycin (VANCOCIN) 500 mg in sodium chloride 0.9 % 100 mL IVPB  Status:  Discontinued     500 mg 100 mL/hr over 60 Minutes Intravenous Every 24 hours 06/13/16 1630 06/17/16 1702   06/13/16 1645  cefTAZidime (FORTAZ) 2 g in dextrose 5 % 50 mL IVPB  Status:  Discontinued     2 g 100 mL/hr over 30 Minutes Intravenous Every 24 hours 06/13/16 1630 06/17/16 1728   06/13/16 1645  vancomycin (VANCOCIN) IVPB 1000 mg/200 mL premix  Status:  Discontinued     1,000 mg 200 mL/hr over 60 Minutes Intravenous  Once 06/13/16 1630 06/13/16 1631   06/13/16 1630  levofloxacin (LEVAQUIN) IVPB 750 mg  Status:  Discontinued     750 mg 100 mL/hr over 90  Minutes Intravenous  Once 06/13/16 1617 06/13/16 1624   06/13/16 1630  aztreonam (AZACTAM) 2 g in dextrose 5 % 50 mL IVPB  Status:  Discontinued     2 g 100 mL/hr over 30 Minutes Intravenous  Once 06/13/16 1617 06/13/16 1624   06/13/16 1630  vancomycin (VANCOCIN) IVPB 1000 mg/200 mL premix     1,000 mg 200 mL/hr over 60 Minutes Intravenous  Once 06/13/16 1617 06/13/16 2105      Subjective:   Melodye Ped seen and examined today.  Continues to feel weak and tired.  Has some nausea, but no vomiting. Has had bowel movements. Denies chest pain, shortness of breath, abdominal pain, headache, dizziness.   Objective:   Filed Vitals:   06/29/16 2140 06/29/16 2327 06/30/16 0611 06/30/16 0802  BP: 130/60 129/76 134/111 114/57  Pulse:   94 74  Temp: 98.9 F (37.2 C) 98.4 F (36.9 C) 98.4 F (36.9 C) 98.4 F (36.9 C)  TempSrc: Oral Oral Oral Oral  Resp: 16 16 18    Height:      Weight:      SpO2: 100% 100% 100% 100%    Intake/Output Summary (Last 24 hours) at 06/30/16 1119 Last data filed at 06/30/16 0650  Gross per 24 hour  Intake    644 ml  Output    902 ml  Net   -258 ml   Filed Weights   06/23/16 0500 06/25/16 0207 06/27/16 0430  Weight: 48.2 kg (106 lb 4.2 oz) 55.8 kg (123  lb 0.3 oz) 55.43 kg (122 lb 3.2 oz)    Exam  General: Well developed,elderly, no distress  HEENT: NCAT,  mucous membranes moist.   Cardiovascular: S1 S2 auscultated, no murmurs, RRR  Respiratory: Clear to auscultation  Abdomen: Soft, nontender, nondistended, + bowel sounds  Extremities: warm dry without cyanosis clubbing. +LE edema  Neuro: AAOx3, nonfocal  Psych: Flat   Data Reviewed: I have personally reviewed following labs and imaging studies  CBC:  Recent Labs Lab 06/28/16 0234 06/29/16 0239 06/30/16 0232  WBC 5.2 6.3 5.9  HGB 7.2* 7.2* 8.3*  HCT 22.8* 23.4* 26.4*  MCV 96.2 99.2 94.0  PLT 213 199 [id number]   Basic Metabolic Panel:  Recent Labs Lab 06/23/16 1603  06/24/16  06/24/16 0727 06/25/16 0322 06/26/16 0228 06/27/16 0330 06/28/16 0234 06/29/16 0239 06/30/16 0232  NA  --   < > 142  --  140 140 139 140 139 139  K  --   < > 4.3  --  4.3 4.2 4.6 5.3* 5.4* 5.4*  CL  --   < > 97*  --  101 101 105 105 105 104  CO2  --   < > 34*  --  29 29 24 28 27 28   GLUCOSE  --   < > 118*  --  202* 76 199* 135* 197* 175*  BUN  --   < > 121*  --  108* 103* 96* 96* 93* 94*  CREATININE  --   < > 3.60*  --  3.79* 3.62* 3.67* 3.74* 3.78* 3.79*  CALCIUM  --   < > 8.0*  --  7.8* 8.0* 8.0* 8.3* 8.6* 8.8*  MG 2.1  --  2.1 2.0  --   --   --   --   --   --   PHOS 5.9*  --  7.0* 7.0* 6.8* 5.9* 5.0*  --   --   --   < > = values in this interval not displayed. GFR: Estimated Creatinine Clearance: 12.1 mL/min (by C-G formula based on Cr of 3.79). Liver Function Tests:  Recent Labs Lab 06/25/16 0322 06/26/16 0228 06/27/16 0330  ALBUMIN 1.7* 1.6* 1.7*   No results for input(s): LIPASE, AMYLASE in the last 168 hours. No results for input(s): AMMONIA in the last 168 hours. Coagulation Profile:  Recent Labs Lab 06/27/16 1134  INR 1.06   Cardiac Enzymes: No results for input(s): CKTOTAL, CKMB, CKMBINDEX, TROPONINI in the last 168 hours. BNP (last 3 results) No results for input(s): PROBNP in the last 8760 hours. HbA1C: No results for input(s): HGBA1C in the last 72 hours. CBG:  Recent Labs Lab 06/29/16 1108 06/29/16 1618 06/29/16 1846 06/29/16 2104 06/30/16 0609  GLUCAP 293* 419* 282* 185* 195*   Lipid Profile: No results for input(s): CHOL, HDL, LDLCALC, TRIG, CHOLHDL, LDLDIRECT in the last 72 hours. Thyroid Function Tests: No results for input(s): TSH, T4TOTAL, FREET4, T3FREE, THYROIDAB in the last 72 hours. Anemia Panel:  Recent Labs  06/28/16 0858  VITAMINB12 1372*  FOLATE 8.5  FERRITIN 1990*  TIBC 234*  IRON 162  RETICCTPCT 3.2*   Urine analysis:    Component Value Date/Time   COLORURINE AMBER* 06/13/2016 1615   COLORURINE Yellow 05/04/2014  0914   APPEARANCEUR TURBID* 06/13/2016 1615   APPEARANCEUR Clear 05/04/2014 0914   LABSPEC 1.021 06/13/2016 1615   LABSPEC 1.010 05/04/2014 0914   LABSPEC 1.020 02/14/2011 1654   PHURINE 5.0 06/13/2016 1615   PHURINE 5.0 05/04/2014 0914  PHURINE 6.0 02/14/2011 1654   GLUCOSEU 250* 06/13/2016 1615   GLUCOSEU >=500 05/04/2014 0914   HGBUR TRACE* 06/13/2016 1615   HGBUR 1+ 05/04/2014 0914   HGBUR Negative 02/14/2011 1654   HGBUR negative 05/17/2009 1334   BILIRUBINUR SMALL* 06/13/2016 1615   BILIRUBINUR Negative 05/04/2014 0914   BILIRUBINUR Negative 02/14/2011 1654   KETONESUR NEGATIVE 06/13/2016 1615   KETONESUR Negative 05/04/2014 0914   KETONESUR Negative 02/14/2011 1654   PROTEINUR NEGATIVE 06/13/2016 1615   PROTEINUR Negative 05/04/2014 0914   PROTEINUR Negative 02/14/2011 1654   UROBILINOGEN 0.2 09/17/2009 0233   NITRITE NEGATIVE 06/13/2016 1615   NITRITE Negative 05/04/2014 0914   NITRITE Negative 02/14/2011 1654   LEUKOCYTESUR LARGE* 06/13/2016 1615   LEUKOCYTESUR 2+ 05/04/2014 0914   LEUKOCYTESUR Trace 02/14/2011 1654   Sepsis Labs: @LABRCNTIP (procalcitonin:4,lacticidven:4)  )No results found for this or any previous visit (from the past 240 hour(s)).    Radiology Studies: No results found.   Scheduled Meds: . sodium chloride   Intravenous Once  . antiseptic oral rinse  7 mL Mouth Rinse q12n4p  . calcium acetate  1,334 mg Oral TID WC  . carbamide peroxide  5 drop Right Ear BID  . carvedilol  3.125 mg Oral BID WC  . chlorhexidine  15 mL Mouth Rinse BID  . cholecalciferol  5,000 Units Oral Daily  . [START ON 07/04/2016] clopidogrel  75 mg Oral QHS  . feeding supplement (GLUCERNA SHAKE)  237 mL Oral TID BM  . fenofibrate  160 mg Oral QHS  . FLUoxetine  20 mg Oral Daily  . furosemide  20 mg Intravenous Once  . gabapentin  200 mg Oral TID  . Gerhardt's butt cream   Topical BID  . insulin aspart  0-20 Units Subcutaneous TID WC  . insulin aspart  0-5 Units  Subcutaneous QHS  . insulin aspart  6 Units Subcutaneous TID WC  . insulin glargine  8 Units Subcutaneous Daily  . isosorbide mononitrate  60 mg Oral Daily  . loratadine  10 mg Oral Daily  . pantoprazole  40 mg Oral BID  . simvastatin  20 mg Oral QHS  . topiramate  50 mg Oral BID   Continuous Infusions:     LOS: 17 days   Time Spent in minutes   30 minutes  Mareta Chesnut D.O. on 06/30/2016 at 11:19 AM  Between 7am to 7pm - Pager - [phone]  After 7pm go to [email or web address] - password [email or web address]  And look for the night coverage person covering for me after hours  [facility] Office  [phone]

## 2016-06-30 NOTE — Progress Notes (Signed)
Day shift RN clarified if pt needed irradiated blood with blood bank. After communication with blood bank night shift RN gave 1 unit of PRBCs. Pt tolerated it well.

## 2016-06-30 NOTE — Progress Notes (Signed)
[facility] Progress Note   65 y.o. female with a PMHx of diabetes mellitus type 2, hyperlipidemia, MGUS, anemia chronic kidney disease, hypertension, coronary artery disease, GERD, chronic kidney disease stage IV BL Cr 1.9-2.2, depression, who was admitted to [facility] on 6/14 with hypotension and concern for sepsis.Renal function initially improved but then has had a consistent trend to a decline in function @ which time we were consulted on 6/26.  Assessment/ Plan:   1. Acute Kidney Injury on CKD IV - patient already with advanced CKD IV even with a baseline creatinine of 1.9-2.2 based on her age and body mass. Fortunately renal function appears to have started to stabilize. - She has had a few hypotensive episodes but none sustained and did not receive contrast during this hospitalization.  - Urine microscopy which I personally reviewed showed only a few pigmented casts, yeast, ~3 WBC/hpf and no RBC's.  - Postvoid residual (38ml) and renal ultrasound --> no mention of thinning of the cortex or hyperehoic cortex.  --> will repeat post void (she states some fullness but questionable reliability)  - SFLC + SPEP w/ monoclonal spike and increased kappa and lambda lightchains; incr K/L ratio.  - I am requesting a renal biopsy given that is not likely to be purely a prerenal azotemic state --> suspicious for an infiltrative disorder --> Plavix will need to be d/c for 5 days; therefore, biopsy scheduled for Mon.  - Sending for serologic w/u  --> RF, ANA both neg  with Nl c3c4.  - UPC only 0.25 --> I anticipate biopsy will show ATN and infiltrative disorder lower on the differential.  - Fortunately she is responding to Lasix which should help with mild hyperkalemia as well.  - Rx 1u PRBC on 6/30 - Floor scale weights daily requested today.  - She did not take the Kayexalate yesterday but has consumed the 30gm today.  2. Shock resolved with sepsis and negative urine + blood cultures now off  Abx (had been treated with Levaquin --> Fortaz + Vancomycin completed on 6/25) 3. CASHD - recent MI before she received a renal biopsy at [facility]. 4. Hypertension - fairly well controlled. 5. MGUS - repeated SPEP and SFLC --> monoclonal spike and elevated K/L ratio 6. Hyperphosphatemia - binders (increased to 2 tabs w/ each meal) --> let's recheck a phos 7. Acute encephalopathy - primary team appropriately adjusting per impaired renal function.    Subjective:   Feeling about the same as yesterday. Has intermittent chest pain not associated with exertion. No f/c. Had CP 6/28 AM resolved with nitro x3.  Nausea but no vomiting Weak and needs assistance to sit to eat. Continues to have loose BM's as well. Denies dyspnea   Objective:   BP 114/57 mmHg  Pulse 74  Temp(Src) 98.4 F (36.9 C) (Oral)  Resp 18  Ht 5\' 3"  (1.6 m)  Wt 55.43 kg (122 lb 3.2 oz)  BMI 21.65 kg/m2  SpO2 100%  Intake/Output Summary (Last 24 hours) at 06/30/16 0921 Last data filed at 06/30/16 0650  Gross per 24 hour  Intake    644 ml  Output    902 ml  Net   -258 ml   Weight change:   Physical Exam: General appearance: alert and cooperative Neck: no adenopathy, no carotid bruit,  Resp: clear to auscultation bilaterally w/ poor air movement Cardio: regular rate and rhythm, S1, S2 normal, no murmur, click, rub or gallop GI: soft, non-tender; bowel sounds normal; no masses, no  organomegaly Extremities: extremities normal, atraumatic, no cyanosis; tr-1+ edema, upper extr as well w/ edema Pulses: 2+ and symmetric Skin: Skin color, texture, turgor normal. No rashes or lesions GU: no foley  Imaging: No results found.  Labs: BMET  Recent Labs Lab 06/23/16 1603 06/24/16 06/24/16 [id number] 06/25/16 0322 06/26/16 0228 06/27/16 0330 06/28/16 0234 06/29/16 0239 06/30/16 0232  NA  --  142  --  140 140 139 140 139 139  K  --  4.3  --  4.3 4.2 4.6 5.3* 5.4* 5.4*  CL  --  97*  --  101 101 105 105 105 104   CO2  --  34*  --  29 29 24 28 27 28   GLUCOSE  --  118*  --  202* 76 199* 135* 197* 175*  BUN  --  121*  --  108* 103* 96* 96* 93* 94*  CREATININE  --  3.60*  --  3.79* 3.62* 3.67* 3.74* 3.78* 3.79*  CALCIUM  --  8.0*  --  7.8* 8.0* 8.0* 8.3* 8.6* 8.8*  PHOS 5.9* 7.0* 7.0* 6.8* 5.9* 5.0*  --   --   --    CBC  Recent Labs Lab 06/28/16 0234 06/29/16 0239 06/30/16 0232  WBC 5.2 6.3 5.9  HGB 7.2* 7.2* 8.3*  HCT 22.8* 23.4* 26.4*  MCV 96.2 99.2 94.0  PLT 213 199 218    Medications:    . sodium chloride   Intravenous Once  . antiseptic oral rinse  7 mL Mouth Rinse q12n4p  . calcium acetate  1,334 mg Oral TID WC  . carbamide peroxide  5 drop Right Ear BID  . carvedilol  3.125 mg Oral BID WC  . chlorhexidine  15 mL Mouth Rinse BID  . cholecalciferol  5,000 Units Oral Daily  . [START ON 07/04/2016] clopidogrel  75 mg Oral QHS  . feeding supplement (GLUCERNA SHAKE)  237 mL Oral TID BM  . fenofibrate  160 mg Oral QHS  . FLUoxetine  20 mg Oral Daily  . furosemide  20 mg Intravenous Once  . gabapentin  200 mg Oral TID  . Gerhardt's butt cream   Topical BID  . insulin aspart  0-20 Units Subcutaneous TID WC  . insulin aspart  0-5 Units Subcutaneous QHS  . insulin aspart  6 Units Subcutaneous TID WC  . insulin glargine  8 Units Subcutaneous Daily  . isosorbide mononitrate  60 mg Oral Daily  . loratadine  10 mg Oral Daily  . pantoprazole  40 mg Oral BID  . simvastatin  20 mg Oral QHS  . topiramate  50 mg Oral BID      Otelia Santee, MD 06/30/2016, 9:21 AM

## 2016-07-01 LAB — CBC
HCT: 27 % — ABNORMAL LOW (ref 36.0–46.0)
Hemoglobin: 8.6 g/dL — ABNORMAL LOW (ref 12.0–15.0)
MCH: 30.7 pg (ref 26.0–34.0)
MCHC: 31.9 g/dL (ref 30.0–36.0)
MCV: 96.4 fL (ref 78.0–100.0)
PLATELETS: 210 10*3/uL (ref 150–400)
RBC: 2.8 MIL/uL — ABNORMAL LOW (ref 3.87–5.11)
RDW: 19.5 % — AB (ref 11.5–15.5)
WBC: 5.3 10*3/uL (ref 4.0–10.5)

## 2016-07-01 LAB — BASIC METABOLIC PANEL
Anion gap: 6 (ref 5–15)
BUN: 83 mg/dL — AB (ref 6–20)
CALCIUM: 8.7 mg/dL — AB (ref 8.9–10.3)
CO2: 29 mmol/L (ref 22–32)
CREATININE: 3.59 mg/dL — AB (ref 0.44–1.00)
Chloride: 106 mmol/L (ref 101–111)
GFR calc non Af Amer: 12 mL/min — ABNORMAL LOW (ref >60)
GFR, EST AFRICAN AMERICAN: 14 mL/min — AB (ref >60)
Glucose, Bld: 80 mg/dL (ref 65–99)
Potassium: 4.9 mmol/L (ref 3.5–5.1)
SODIUM: 141 mmol/L (ref 135–145)

## 2016-07-01 LAB — GLUCOSE, CAPILLARY
GLUCOSE-CAPILLARY: 104 mg/dL — AB (ref 65–99)
Glucose-Capillary: 129 mg/dL — ABNORMAL HIGH (ref 65–99)
Glucose-Capillary: 170 mg/dL — ABNORMAL HIGH (ref 65–99)
Glucose-Capillary: 69 mg/dL (ref 65–99)
Glucose-Capillary: 93 mg/dL (ref 65–99)

## 2016-07-01 LAB — PHOSPHORUS: PHOSPHORUS: 3.8 mg/dL (ref 2.5–4.6)

## 2016-07-01 NOTE — Progress Notes (Signed)
PROGRESS NOTE    [name]  [id number] DOB: [birth year] DOA: 06/13/2016 PCP: [name], MD   Brief Narrative:  HPI on 06/13/2016 by Dr. Cain Saupe (ICU) 65 year old female with PMH as below, which includes DM, CAD, HTN, and recent admission for R femoral neck fracture s/p arthoplasty 6/8. She had suffered several fall prior to the fall that caused the fracture. Post operative course was fairly uncomplicated with the exception of some mild anemia requiring PRBC transfusion and UTI treated with levaquin. She was discharged to [facility] for rehabilitation on 6/12. 6/14 she returned to ER with chief complaint of AMS with associated hypotension. In the emergency department she was noted to be lethargic and confused. She was complaining of abdominal pain. She was hypotensive and hypothermic with elevated WBC. Glucose noted to be markedly elevated, however urine ketones negative. With metabolic acidosis. Lactic acid wnl. Due to persistent hypotension and concern for septic shock PCCM asked to admit.   Interim history Treated for DKA, hypotension, and altered mental status. Found to have AKI.  Nephrology consulted. Plan for renal biopsy on 7/3  Assessment & Plan   Acute respiratory failure with hypoxemia (HCC) secondary to pulmonary edema with possible left base HCAP -Continue supplemental oxygen and BiPAP at bedtime.  -Continue pulmonary hygiene and oxygen to maintain saturations 88-92 percent.  -Levaquin discontinued 06/24/16. -Very deconditioned with prolonged hospital stay.  -CIR consulted and felt patient was not a candidate for inpatient rehab -likely will return to SNF  AKI (acute kidney injury) (HCC)/stage III-IV chronic kidney disease -Baseline creatinine 1.9-2.0. -Current creatinine remains elevated, today 3.59 -Nephrology consulted and appreciated.  -Renal ultrasound on 06/25/16, no hydronephrosis.  -Will hold IVF today as patient does have LE edema -SFLC and  SPEP + with monoclonal spike and increase L lambda light chains. -renal biopsy ordered- pending for 07/02/2016 (needs to be off of Plavix for 5 days)  Shock secondary to hypovolemia and probable sepsis -Blood and urine cultures negative.  -Strep pneumonia and legionella urine antigens negative -Antibiotics narrowed to Levaquin status post 8 days of therapy with Tressie Ellis and vancomycin.  -Repeat chest x-ray showed an interval decrease in bilateral diffuse airspace disease. -WBC now normalized.  -Antibiotics discontinued on 06/24/16.  CAD (coronary artery disease)/demand ischemia -Continue Coreg, labetalol, Imdur, fenofibrate, Zocor and Plavix.  Diabetic ketoacidosis without coma associated with type 2 diabetes mellitus (HCC)/metabolic acidosis -Resolved.  -Continue ISS and lantus with CBG monitoring   Hypertension -Continue Coreg and labetalol.  Hyperlipidemia -Continue fenofibrate and Zocor.  Possible pancreatitis/elevated lipase -Diet has now been advanced. NG tube removed. Poor appetite.  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  -Platelets normalized.  -Repeat SPEP/SFLC as above -hemoglobin 8.6 today, received 1u PRBC on 06/29/2016 -FOBT negative -Anemia panel: adequate iron and iron stores -Continue to monitor CBC -Heparin held  Acute encephalopathy -Back to baseline -Neurontin & Topamax dose adjusted given impaired renal function.   Hypo/hyperphosphatemia -Initially repleted, now high. Likely reflective of progressive renal failure.  -Continue PhosLo, improving.  Hyperkalemia -resolved, Potassium 4.9 today -Continue to monitor   Anxiety -Ativan ordered as needed Q 8 hours. Continue Prozac.  DVT Prophylaxis  Heparin--> SCDs  Code Status: Full  Family Communication: None at bedside  Disposition Plan: Admitted. Pending renal biopsy, monitor hemoglobin  Consultants Nephrology PCCM Inpatient Rehab Interventional  raidiology   Procedures  TTE (06/24/15): EF 55-60%. LA & RA normal in size. RV normal in size and function.  No aortic stenosis or regurgitation. Mild mitral regurgitation. Mild tricuspid regurgitation. Port CXR 6/14: Bilateral hilar fullness and lower lobe opacification with silhouetting of the left hemidiaphragm. Port Abd X-ray 6/14: No free air under diaphragm. Right hip hemiarthroplasty. Bowel gas pattern nonobstructive.  Antibiotics   Anti-infectives    Start     Dose/Rate Route Frequency Ordered Stop   06/23/16 1630  levofloxacin (LEVAQUIN) IVPB 500 mg  Status:  Discontinued     500 mg 100 mL/hr over 60 Minutes Intravenous Every 48 hours 06/21/16 1043 06/24/16 0958   06/21/16 1630  levofloxacin (LEVAQUIN) IVPB 750 mg     750 mg 100 mL/hr over 90 Minutes Intravenous  Once 06/21/16 1043 06/21/16 1720   06/18/16 1700  cefTAZidime (FORTAZ) 1 g in dextrose 5 % 50 mL IVPB  Status:  Discontinued     1 g 100 mL/hr over 30 Minutes Intravenous Every 24 hours 06/17/16 1728 06/21/16 1032   06/18/16 1200  vancomycin (VANCOCIN) IVPB 750 mg/150 ml premix  Status:  Discontinued     750 mg 150 mL/hr over 60 Minutes Intravenous Every 48 hours 06/17/16 1728 06/21/16 1032   06/14/16 1630  vancomycin (VANCOCIN) 500 mg in sodium chloride 0.9 % 100 mL IVPB  Status:  Discontinued     500 mg 100 mL/hr over 60 Minutes Intravenous Every 24 hours 06/13/16 1630 06/17/16 1702   06/13/16 1645  cefTAZidime (FORTAZ) 2 g in dextrose 5 % 50 mL IVPB  Status:  Discontinued     2 g 100 mL/hr over 30 Minutes Intravenous Every 24 hours 06/13/16 1630 06/17/16 1728   06/13/16 1645  vancomycin (VANCOCIN) IVPB 1000 mg/200 mL premix  Status:  Discontinued     1,000 mg 200 mL/hr over 60 Minutes Intravenous  Once 06/13/16 1630 06/13/16 1631   06/13/16 1630  levofloxacin (LEVAQUIN) IVPB 750 mg  Status:  Discontinued     750 mg 100 mL/hr over 90 Minutes Intravenous  Once 06/13/16 1617 06/13/16 1624   06/13/16 1630  aztreonam  (AZACTAM) 2 g in dextrose 5 % 50 mL IVPB  Status:  Discontinued     2 g 100 mL/hr over 30 Minutes Intravenous  Once 06/13/16 1617 06/13/16 1624   06/13/16 1630  vancomycin (VANCOCIN) IVPB 1000 mg/200 mL premix     1,000 mg 200 mL/hr over 60 Minutes Intravenous  Once 06/13/16 1617 06/13/16 2105      Subjective:   [name] seen and examined today.  Feels sleepy this morning.  Denies nausea or vomiting today. Has had bowel movements. Denies chest pain, shortness of breath, abdominal pain, headache, dizziness.   Objective:   Filed Vitals:   06/30/16 1442 06/30/16 2010 07/01/16 0505 07/01/16 0630  BP: 129/61 141/60 149/68 163/77  Pulse: 82 79 94 90  Temp: 97.9 F (36.6 C) 98.6 F (37 C) 98.4 F (36.9 C)   TempSrc: Oral Oral Oral   Resp: 18 20 20    Height:      Weight:      SpO2: 99% 94% 100%     Intake/Output Summary (Last 24 hours) at 07/01/16 1044 Last data filed at 06/30/16 1310  Gross per 24 hour  Intake      0 ml  Output      2 ml  Net     -2 ml   Filed Weights   06/23/16 0500 06/25/16 0207 06/27/16 0430  Weight: 48.2 kg (106 lb 4.2 oz) 55.8 kg (123 lb 0.3 oz) 55.43 kg (122  lb 3.2 oz)    Exam  General: Well developed,elderly, no apparent distress  HEENT: NCAT,  mucous membranes moist.   Cardiovascular: S1 S2 auscultated, no murmurs  Respiratory: Diminished but Clear, equal chest rise, no wheezing.  Abdomen: Soft, nontender, nondistended, + bowel sounds  Extremities: warm dry without cyanosis clubbing. +LE edema  Neuro: AAOx3, nonfocal  Psych: Flat affect   Data Reviewed: I have personally reviewed following labs and imaging studies  CBC:  Recent Labs Lab 06/28/16 0234 06/29/16 0239 06/30/16 0232 07/01/16 0218  WBC 5.2 6.3 5.9 5.3  HGB 7.2* 7.2* 8.3* 8.6*  HCT 22.8* 23.4* 26.4* 27.0*  MCV 96.2 99.2 94.0 96.4  PLT 213 199 218 [id number]   Basic Metabolic Panel:  Recent Labs Lab 06/25/16 0322 06/26/16 0228 06/27/16 0330 06/28/16 0234  06/29/16 0239 06/30/16 0232 07/01/16 0218  NA 140 140 139 140 139 139 141  K 4.3 4.2 4.6 5.3* 5.4* 5.4* 4.9  CL 101 101 105 105 105 104 106  CO2 29 29 24 28 27 28 29   GLUCOSE 202* 76 199* 135* 197* 175* 80  BUN 108* 103* 96* 96* 93* 94* 83*  CREATININE 3.79* 3.62* 3.67* 3.74* 3.78* 3.79* 3.59*  CALCIUM 7.8* 8.0* 8.0* 8.3* 8.6* 8.8* 8.7*  PHOS 6.8* 5.9* 5.0*  --   --   --  3.8   GFR: Estimated Creatinine Clearance: 12.8 mL/min (by C-G formula based on Cr of 3.59). Liver Function Tests:  Recent Labs Lab 06/25/16 0322 06/26/16 0228 06/27/16 0330  ALBUMIN 1.7* 1.6* 1.7*   No results for input(s): LIPASE, AMYLASE in the last 168 hours. No results for input(s): AMMONIA in the last 168 hours. Coagulation Profile:  Recent Labs Lab 06/27/16 1134  INR 1.06   Cardiac Enzymes: No results for input(s): CKTOTAL, CKMB, CKMBINDEX, TROPONINI in the last 168 hours. BNP (last 3 results) No results for input(s): PROBNP in the last 8760 hours. HbA1C: No results for input(s): HGBA1C in the last 72 hours. CBG:  Recent Labs Lab 06/30/16 0609 06/30/16 1232 06/30/16 1614 06/30/16 2106 07/01/16 0620  GLUCAP 195* 136* 75 85 129*   Lipid Profile: No results for input(s): CHOL, HDL, LDLCALC, TRIG, CHOLHDL, LDLDIRECT in the last 72 hours. Thyroid Function Tests: No results for input(s): TSH, T4TOTAL, FREET4, T3FREE, THYROIDAB in the last 72 hours. Anemia Panel: No results for input(s): VITAMINB12, FOLATE, FERRITIN, TIBC, IRON, RETICCTPCT in the last 72 hours. Urine analysis:    Component Value Date/Time   COLORURINE AMBER* 06/13/2016 1615   COLORURINE Yellow 05/04/2014 0914   APPEARANCEUR TURBID* 06/13/2016 1615   APPEARANCEUR Clear 05/04/2014 0914   LABSPEC 1.021 06/13/2016 1615   LABSPEC 1.010 05/04/2014 0914   LABSPEC 1.020 02/14/2011 1654   PHURINE 5.0 06/13/2016 1615   PHURINE 5.0 05/04/2014 0914   PHURINE 6.0 02/14/2011 1654   GLUCOSEU 250* 06/13/2016 1615   GLUCOSEU  >=500 05/04/2014 0914   HGBUR TRACE* 06/13/2016 1615   HGBUR 1+ 05/04/2014 0914   HGBUR Negative 02/14/2011 1654   HGBUR negative 05/17/2009 1334   BILIRUBINUR SMALL* 06/13/2016 1615   BILIRUBINUR Negative 05/04/2014 0914   BILIRUBINUR Negative 02/14/2011 1654   KETONESUR NEGATIVE 06/13/2016 1615   KETONESUR Negative 05/04/2014 0914   KETONESUR Negative 02/14/2011 1654   PROTEINUR NEGATIVE 06/13/2016 1615   PROTEINUR Negative 05/04/2014 0914   PROTEINUR Negative 02/14/2011 1654   UROBILINOGEN 0.2 09/17/2009 0233   NITRITE NEGATIVE 06/13/2016 1615   NITRITE Negative 05/04/2014 0914   NITRITE Negative 02/14/2011 1654  LEUKOCYTESUR LARGE* 06/13/2016 1615   LEUKOCYTESUR 2+ 05/04/2014 0914   LEUKOCYTESUR Trace 02/14/2011 1654   Sepsis Labs: @LABRCNTIP (procalcitonin:4,lacticidven:4)  )No results found for this or any previous visit (from the past 240 hour(s)).    Radiology Studies: No results found.   Scheduled Meds: . sodium chloride   Intravenous Once  . antiseptic oral rinse  7 mL Mouth Rinse q12n4p  . calcium acetate  1,334 mg Oral TID WC  . carbamide peroxide  5 drop Right Ear BID  . carvedilol  3.125 mg Oral BID WC  . chlorhexidine  15 mL Mouth Rinse BID  . cholecalciferol  5,000 Units Oral Daily  . [START ON 07/04/2016] clopidogrel  75 mg Oral QHS  . feeding supplement (GLUCERNA SHAKE)  237 mL Oral TID BM  . fenofibrate  160 mg Oral QHS  . FLUoxetine  20 mg Oral Daily  . furosemide  20 mg Intravenous Once  . gabapentin  200 mg Oral TID  . Gerhardt's butt cream   Topical BID  . insulin aspart  0-20 Units Subcutaneous TID WC  . insulin aspart  0-5 Units Subcutaneous QHS  . insulin aspart  6 Units Subcutaneous TID WC  . insulin glargine  8 Units Subcutaneous Daily  . isosorbide mononitrate  60 mg Oral Daily  . loratadine  10 mg Oral Daily  . pantoprazole  40 mg Oral BID  . simvastatin  20 mg Oral QHS  . topiramate  50 mg Oral BID   Continuous Infusions:      LOS: 18 days   Time Spent in minutes   30 minutes  Akyah Lagrange D.O. on 07/01/2016 at 10:44 AM  Between 7am to 7pm - Pager - [phone]  After 7pm go to [email or web address] - password [email or web address]  And look for the night coverage person covering for me after hours  [facility] Office  [phone]

## 2016-07-01 NOTE — Consult Note (Signed)
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 Initially presented as a Stage 2 on admission, now with yellow slough obscuring wound base Wound type:Pressure Pressure Ulcer POA: Yes Measurement:1cm x 1.5cm with depth unable to be determined due to the presence of firmly adherent yellow slough. The area is located in a deep red area of dermatitis that appears to be fungal in origin or at least have a fungal component.  Patient has been treated for this with Dr. Lurline Del butt cream, a compounded mixture of zinc oxide, triamcinolone cream and lotrimin cream for two weeks without resolution. Area extends from sacrum to perineal area and to the medal thighs. Wound bed:As described above Drainage (amount, consistency, odor) Scant yellow drainage from pressure injury Periwound:As described above Dressing procedure/placement/frequency: I will initiate an enzymatic debriding agent to the coccyx ulcer and suggest consideration of systemic treatment for the presumed fungal dermatitis (e.g. Diflucan) with an antifungal agent, either IV or PO for several doses for treatment of the dermatitis. If you agree, please order.  Since the compounded cream has not been effective, I have discontinued.  If no improvement with systemic antifungal, recommend dermatology consult either in house or very soon after discharge. Patient has a pressure redistribution chair pad in the chair for OOB use and is increasing mobility with a walker, so I will refrain from implementing a therapeutic mattress with low air loss feature for drying of the tissues. [place] nursing team will not follow, but will remain available to this patient, the nursing and medical teams.  Please re-consult if needed. Thanks, Maudie Flakes, MSN, RN, [place], Arther Abbott  Pager# [phone]

## 2016-07-01 NOTE — Progress Notes (Signed)
Hypoglycemic Event  CBG: 69  Treatment: 15 GM carbohydrate snack  Symptoms: Sweaty  Follow-up CBG: Time:10:05 CBG Result: 93  Possible Reasons for Event: Inadequate meal intake     [name] M

## 2016-07-01 NOTE — Progress Notes (Signed)
[facility] Progress Note   65 y.o. female with a PMHx of diabetes mellitus type 2, hyperlipidemia, MGUS, anemia chronic kidney disease, hypertension, coronary artery disease, GERD, chronic kidney disease stage IV BL Cr 1.9-2.2, depression, who was admitted to [facility] on 6/14 with hypotension and concern for sepsis.Renal function initially improved but then has had a consistent trend to a decline in function @ which time we were consulted on 6/26.  Assessment/ Plan:   1. Acute Kidney Injury on CKD IV - patient already with advanced CKD IV even with a baseline creatinine of 1.9-2.2 based on her age and body mass. Fortunately renal function appears to have started to stabilize. - She has had a few hypotensive episodes but none sustained and did not receive contrast during this hospitalization.  - Urine microscopy which I personally reviewed showed only a few pigmented casts, yeast, ~3 WBC/hpf and no RBC's.  - Postvoid residual (51ml) and renal ultrasound --> no mention of thinning of the cortex or hyperechoic cortex.   - SFLC + SPEP w/ monoclonal spike and increased kappa and lambda lightchains; incr K/L ratio.  - I am requesting a renal biopsy given that is not likely to be purely a prerenal azotemic state --> ruling out an an infiltrative disorder --> Plavix will need to be d/c for 5 days; therefore, biopsy scheduled for Mon.  - Sending for serologic w/u --> RF, ANA both neg with Nl c3c4.  - UPC only 0.25 with a neg dip for protein  --> I anticipate biopsy will show ATN and infiltrative disorder lower on the differential.  - Fortunately she responded to Lasix helped with mild hyperkalemia; diarrhea actually helpful as well from the Kayexalate (she consumed 30gm on 7/1).  - Rx 1u PRBC on 6/30 - Floor scale weights daily requested.  - Maybe she's starting to recover from ATN induced by the septic episode?   2. Shock resolved with sepsis and negative urine + blood cultures  now off Abx (had been treated with Levaquin --> Fortaz + Vancomycin completed on 6/25) 3. CASHD - recent MI before she received a renal biopsy at [facility]. 4. Hypertension - fairly well controlled. 5. MGUS - repeated SPEP and SFLC --> monoclonal spike and elevated K/L ratio 6. Hyperphosphatemia - binders (increased to 2 tabs w/ each meal) --> let's recheck a phos 7. Acute encephalopathy - primary team appropriately adjusting per impaired renal function.  Subjective:   Feeling about the same as yesterday. Has intermittent chest pain not associated with exertion. No f/c. Had CP 6/28 AM resolved with nitro x3.  Nausea but no vomiting Weak and needs assistance to sit to eat. Continues to have loose BM's as well. Denies dyspnea   Objective:   BP 163/77 mmHg  Pulse 90  Temp(Src) 98.4 F (36.9 C) (Oral)  Resp 20  Ht 5\' 3"  (1.6 m)  Wt 55.43 kg (122 lb 3.2 oz)  BMI 21.65 kg/m2  SpO2 100%  Intake/Output Summary (Last 24 hours) at 07/01/16 1046 Last data filed at 06/30/16 1310  Gross per 24 hour  Intake      0 ml  Output      2 ml  Net     -2 ml   Weight change:   Physical Exam: General appearance: alert and cooperative Neck: no adenopathy, no carotid bruit,  Resp: clear to auscultation bilaterally w/ poor air movement Cardio: regular rate and rhythm, S1, S2 normal, no murmur, click, rub or gallop GI: soft, non-tender; bowel  sounds normal; no masses, no organomegaly Extremities: atraumatic, no cyanosis; tr-1+ edema, upper extr as well w/ edema Pulses: 2+ and symmetric Skin: Skin color, texture, turgor normal. No rashes or lesions GU: no foley  Imaging: No results found.  Labs: BMET  Recent Labs Lab 06/25/16 [id number] 06/26/16 0228 06/27/16 0330 06/28/16 0234 06/29/16 0239 06/30/16 0232 07/01/16 0218  NA 140 140 139 140 139 139 141  K 4.3 4.2 4.6 5.3* 5.4* 5.4* 4.9  CL 101 101 105 105 105 104 106  CO2 29 29 24 28 27 28 29   GLUCOSE 202* 76 199* 135* 197* 175* 80   BUN 108* 103* 96* 96* 93* 94* 83*  CREATININE 3.79* 3.62* 3.67* 3.74* 3.78* 3.79* 3.59*  CALCIUM 7.8* 8.0* 8.0* 8.3* 8.6* 8.8* 8.7*  PHOS 6.8* 5.9* 5.0*  --   --   --  3.8   CBC  Recent Labs Lab 06/28/16 0234 06/29/16 0239 06/30/16 0232 07/01/16 0218  WBC 5.2 6.3 5.9 5.3  HGB 7.2* 7.2* 8.3* 8.6*  HCT 22.8* 23.4* 26.4* 27.0*  MCV 96.2 99.2 94.0 96.4  PLT 213 199 218 210    Medications:    . sodium chloride   Intravenous Once  . antiseptic oral rinse  7 mL Mouth Rinse q12n4p  . calcium acetate  1,334 mg Oral TID WC  . carbamide peroxide  5 drop Right Ear BID  . carvedilol  3.125 mg Oral BID WC  . chlorhexidine  15 mL Mouth Rinse BID  . cholecalciferol  5,000 Units Oral Daily  . [START ON 07/04/2016] clopidogrel  75 mg Oral QHS  . feeding supplement (GLUCERNA SHAKE)  237 mL Oral TID BM  . fenofibrate  160 mg Oral QHS  . FLUoxetine  20 mg Oral Daily  . furosemide  20 mg Intravenous Once  . gabapentin  200 mg Oral TID  . Gerhardt's butt cream   Topical BID  . insulin aspart  0-20 Units Subcutaneous TID WC  . insulin aspart  0-5 Units Subcutaneous QHS  . insulin aspart  6 Units Subcutaneous TID WC  . insulin glargine  8 Units Subcutaneous Daily  . isosorbide mononitrate  60 mg Oral Daily  . loratadine  10 mg Oral Daily  . pantoprazole  40 mg Oral BID  . simvastatin  20 mg Oral QHS  . topiramate  50 mg Oral BID      Otelia Santee, MD 07/01/2016, 10:46 AM

## 2016-07-02 ENCOUNTER — Inpatient Hospital Stay (HOSPITAL_COMMUNITY): Payer: Commercial Managed Care - HMO

## 2016-07-02 LAB — BASIC METABOLIC PANEL
ANION GAP: 16 — AB (ref 5–15)
BUN: 76 mg/dL — AB (ref 6–20)
CALCIUM: 9.8 mg/dL (ref 8.9–10.3)
CO2: 26 mmol/L (ref 22–32)
Chloride: 103 mmol/L (ref 101–111)
Creatinine, Ser: 3.46 mg/dL — ABNORMAL HIGH (ref 0.44–1.00)
GFR calc Af Amer: 15 mL/min — ABNORMAL LOW (ref >60)
GFR, EST NON AFRICAN AMERICAN: 13 mL/min — AB (ref >60)
Glucose, Bld: 164 mg/dL — ABNORMAL HIGH (ref 65–99)
POTASSIUM: 5.3 mmol/L — AB (ref 3.5–5.1)
SODIUM: 145 mmol/L (ref 135–145)

## 2016-07-02 LAB — CBC
HCT: 27.6 % — ABNORMAL LOW (ref 36.0–46.0)
Hemoglobin: 8.5 g/dL — ABNORMAL LOW (ref 12.0–15.0)
MCH: 29.8 pg (ref 26.0–34.0)
MCHC: 30.8 g/dL (ref 30.0–36.0)
MCV: 96.8 fL (ref 78.0–100.0)
PLATELETS: 220 10*3/uL (ref 150–400)
RBC: 2.85 MIL/uL — AB (ref 3.87–5.11)
RDW: 19.5 % — AB (ref 11.5–15.5)
WBC: 5.5 10*3/uL (ref 4.0–10.5)

## 2016-07-02 LAB — GLUCOSE, CAPILLARY
GLUCOSE-CAPILLARY: 128 mg/dL — AB (ref 65–99)
GLUCOSE-CAPILLARY: 154 mg/dL — AB (ref 65–99)
GLUCOSE-CAPILLARY: 66 mg/dL (ref 65–99)
Glucose-Capillary: 102 mg/dL — ABNORMAL HIGH (ref 65–99)

## 2016-07-02 LAB — PROTIME-INR
INR: 1.05 (ref 0.00–1.49)
Prothrombin Time: 13.9 seconds (ref 11.6–15.2)

## 2016-07-02 LAB — APTT: APTT: 26 s (ref 24–37)

## 2016-07-02 MED ORDER — SODIUM POLYSTYRENE SULFONATE 15 GM/60ML PO SUSP
15.0000 g | Freq: Once | ORAL | Status: AC
  Administered 2016-07-02: 15 g via ORAL
  Filled 2016-07-02: qty 60

## 2016-07-02 MED ORDER — FENTANYL CITRATE (PF) 100 MCG/2ML IJ SOLN
INTRAMUSCULAR | Status: AC | PRN
  Administered 2016-07-02 (×2): 25 ug via INTRAVENOUS

## 2016-07-02 MED ORDER — DARBEPOETIN ALFA 100 MCG/0.5ML IJ SOSY
100.0000 ug | PREFILLED_SYRINGE | Freq: Once | INTRAMUSCULAR | Status: AC
  Administered 2016-07-02: 100 ug via SUBCUTANEOUS
  Filled 2016-07-02: qty 0.5

## 2016-07-02 MED ORDER — FUROSEMIDE 40 MG PO TABS
40.0000 mg | ORAL_TABLET | Freq: Every day | ORAL | Status: DC
Start: 2016-07-02 — End: 2016-07-03

## 2016-07-02 MED ORDER — LIDOCAINE HCL 1 % IJ SOLN
INTRAMUSCULAR | Status: AC
  Filled 2016-07-02: qty 20

## 2016-07-02 MED ORDER — FENTANYL CITRATE (PF) 100 MCG/2ML IJ SOLN
INTRAMUSCULAR | Status: AC
  Filled 2016-07-02: qty 2

## 2016-07-02 MED ORDER — MIDAZOLAM HCL 2 MG/2ML IJ SOLN
INTRAMUSCULAR | Status: AC | PRN
  Administered 2016-07-02: 1 mg via INTRAVENOUS

## 2016-07-02 MED ORDER — MIDAZOLAM HCL 2 MG/2ML IJ SOLN
INTRAMUSCULAR | Status: AC
  Filled 2016-07-02: qty 2

## 2016-07-02 NOTE — Procedures (Signed)
Random Renal cortex biopsy 16 g times two No comp/EBL

## 2016-07-02 NOTE — Progress Notes (Signed)
PT Cancellation Note  Patient Details Name: EMALEE BURK MRN: [id number] DOB: 1951-07-26   Cancelled Treatment:    Reason Eval/Treat Not Completed: Patient at procedure or test/unavailable (Pt was getting a kidney biopsy.  Return as able.)   Irwin Brakeman F 07/02/2016, 11:40 AM [facility] [phone] (pager)

## 2016-07-02 NOTE — Progress Notes (Addendum)
RT tried to placed CPAP on patient. Patient begin to panic and RT removed the mask. Patient can not tolerate mask. Patient placed back on 2L [place].

## 2016-07-02 NOTE — Care Management Important Message (Signed)
Important Message  Patient Details  Name: [name] MRN: [id number] Date of Birth: 1951-04-15   Medicare Important Message Given:  Yes    [name] May 07/02/2016, 10:43 AM

## 2016-07-02 NOTE — Progress Notes (Signed)
[facility] Progress Note     Subjective:    For renal biopsy today No complaints today of CP or SOB   Objective:   BP 157/73 mmHg  Pulse 95  Temp(Src) 98.3 F (36.8 C) (Oral)  Resp 20  Ht 5\' 3"  (1.6 m)  Wt 55.43 kg (122 lb 3.2 oz)  BMI 21.65 kg/m2  SpO2 100%  Intake/Output Summary (Last 24 hours) at 07/02/16 1130 Last data filed at 07/02/16 0800  Gross per 24 hour  Intake      0 ml  Output      0 ml  Net      0 ml   Weight change:   Physical Exam: Alert and cooperative Lungs clear to auscultation bilaterally  Regular rate and rhythm, S1, S2 normal. No S3 or S4 No focal abd tenderness 1+ edema LE's   Labs:   Recent Labs Lab 06/26/16 0228 06/27/16 0330 06/28/16 0234 06/29/16 0239 06/30/16 0232 07/01/16 0218 07/02/16 0211  NA 140 139 140 139 139 141 145  K 4.2 4.6 5.3* 5.4* 5.4* 4.9 5.3*  CL 101 105 105 105 104 106 103  CO2 29 24 28 27 28 29 26   GLUCOSE 76 199* 135* 197* 175* 80 164*  BUN 103* 96* 96* 93* 94* 83* 76*  CREATININE 3.62* 3.67* 3.74* 3.78* 3.79* 3.59* 3.46*  CALCIUM 8.0* 8.0* 8.3* 8.6* 8.8* 8.7* 9.8  PHOS 5.9* 5.0*  --   --   --  3.8  --      Recent Labs Lab 06/29/16 0239 06/30/16 0232 07/01/16 0218 07/02/16 0211  WBC 6.3 5.9 5.3 5.5  HGB 7.2* 8.3* 8.6* 8.5*  HCT 23.4* 26.4* 27.0* 27.6*  MCV 99.2 94.0 96.4 96.8  PLT 199 218 210 220   Lab Results  Component Value Date   IRON 162 06/28/2016   TIBC 234* 06/28/2016   FERRITIN 1990* 06/28/2016  TSat 69  Results for STARRLA, METOXEN (MRN [id number]) as of 07/02/2016 11:31  Ref. Range 06/26/2015 04:41 06/25/2016 15:07  Kappa free light chain Latest Ref Range: 3.3-19.4 mg/L 120.90 (H) 154.4 (H)  Lambda Free Lght Chn Latest Ref Range: 0.57-2.63 mg/dL    Lamda free light chains Latest Ref Range: 5.7-26.3 mg/L 16.67 41.6 (H)  Kappa, lamda light chain ratio Latest Ref Range: 0.26-1.65  7.25 (H) 3.71 (H)  Kappa:Lambda Ratio Latest Ref Range: 0.26-1.65         Medications:    . sodium chloride   Intravenous Once  . antiseptic oral rinse  7 mL Mouth Rinse q12n4p  . calcium acetate  1,334 mg Oral TID WC  . carbamide peroxide  5 drop Right Ear BID  . carvedilol  3.125 mg Oral BID WC  . chlorhexidine  15 mL Mouth Rinse BID  . cholecalciferol  5,000 Units Oral Daily  . [START ON 07/04/2016] clopidogrel  75 mg Oral QHS  . feeding supplement (GLUCERNA SHAKE)  237 mL Oral TID BM  . fenofibrate  160 mg Oral QHS  . FLUoxetine  20 mg Oral Daily  . furosemide  20 mg Intravenous Once  . gabapentin  200 mg Oral TID  . insulin aspart  0-20 Units Subcutaneous TID WC  . insulin aspart  0-5 Units Subcutaneous QHS  . insulin aspart  6 Units Subcutaneous TID WC  . insulin glargine  8 Units Subcutaneous Daily  . isosorbide mononitrate  60 mg Oral Daily  . loratadine  10 mg Oral Daily  . pantoprazole  40 mg Oral BID  . simvastatin  20 mg Oral QHS  . sodium polystyrene  15 g Oral Once  . topiramate  50 mg Oral BID   Background 65 y.o. female with a PMHx of diabetes mellitus type 2, hyperlipidemia, MGUS, anemia, hypertension, CAD, GERD, chronic kidney disease stage IV BL Cr 1.9-2.2, depression. Prior AKI 02/2016 in setting NSTEMI evaluated by Dr. Holley Raring at [facility] peak creatinine 2.9, down to 2.02-2.4, lost to outpt F/U. Had R fem neck fracture with arthroplasty 6/8, discharged to [facility] 6/12, readmitted to [facility]  6/14 with hypotension and concern for sepsis, resp fx, pulm edema. .Renal function initially improved but then consistent trend to a decline in function @ which time we were consulted on 6/26. Peak creatinine 3.79 on 06/30/16, appears to have started declining 7/3.  Assessment/ Plan:   1. AKI on CKD IV - patient already w/CKD IV even with a baseline creatinine of 1.9-2.2 based on her age and body mass. Prior episodes AKI with reduced renal reserve.  Bland UA, renal US 10.6, 10.7 cm kidneys . Complements nl, RF, ANA neg. SFLC + SPEP w/ monoclonal  spike and increased kappa and lambda light chains; incr K/L ratio. Renal biopsy planned for today as arranged by Dr. Augustin Coupe. Creatinine may be starting to trend down,UOP not being recorded. Weights all over the place 2. Edema. Not on standing dose of lasix. Appears has had small doses here and there. Will start with 40 mg/day. This should help offset the tendency toward hyperkalemia as well. 3. Hyperkalemia - has required kayexalate 3 times this admission. K 5.3. Place on K restricted diet, when diet is resumed. Lasix as above. 4. Shock resolved with sepsis and negative urine + blood cultures now off Abx (had been treated with Levaquin --> Fortaz + Vancomycin completed on 6/25) 5. HCAP (poss L base) + pulm edema. Off ATB's since 6/25. 6. Anemia - Rx 1u PRBC on 6/30. Hb 8.5. TSat normal. Hb was 10.4 prior to hip arthroplasty 6/8. Give dose of Aranesp 100 today 7. CAD - h/o STEMI 02/2016.  8. Hypertension - fairly well controlled. 9. MGUS - repeated SPEP and SFLC --> monoclonal spike and elevated K/L ratio - for renal bx to see if that is part of her CKD. 10. Hyperphosphatemia - binders (increased to 2 tabs w/ each meal). Last phos good. 11. DM 12. HLD 13. Encephalopathy resolved   Jamal Maes, MD [facility] [phone] Pager 07/02/2016, 11:50 AM

## 2016-07-02 NOTE — Progress Notes (Signed)
PROGRESS NOTE    [name]  [id number] DOB: [birth year] DOA: 06/13/2016 PCP: [name], MD   Brief Narrative:  HPI on 06/13/2016 by Dr. Cain Saupe (ICU) 65 year old female with PMH as below, which includes DM, CAD, HTN, and recent admission for R femoral neck fracture s/p arthoplasty 6/8. She had suffered several fall prior to the fall that caused the fracture. Post operative course was fairly uncomplicated with the exception of some mild anemia requiring PRBC transfusion and UTI treated with levaquin. She was discharged to [facility] for rehabilitation on 6/12. 6/14 she returned to ER with chief complaint of AMS with associated hypotension. In the emergency department she was noted to be lethargic and confused. She was complaining of abdominal pain. She was hypotensive and hypothermic with elevated WBC. Glucose noted to be markedly elevated, however urine ketones negative. With metabolic acidosis. Lactic acid wnl. Due to persistent hypotension and concern for septic shock PCCM asked to admit.   Interim history Treated for DKA, hypotension, and altered mental status. Found to have AKI.  Nephrology consulted. Plan for renal biopsy on 7/3  Assessment & Plan   Acute respiratory failure with hypoxemia (HCC) secondary to pulmonary edema with possible left base HCAP -Continue supplemental oxygen and BiPAP at bedtime.  -Continue pulmonary hygiene and oxygen to maintain saturations 88-92 percent.  -Levaquin discontinued 06/24/16. -Very deconditioned with prolonged hospital stay.  -CIR consulted and felt patient was not a candidate for inpatient rehab -likely will return to SNF  AKI (acute kidney injury) (HCC)/stage III-IV chronic kidney disease -Baseline creatinine 1.9-2.0. -Current creatinine remains elevated, today 3.46 -Nephrology consulted and appreciated.  -Renal ultrasound on 06/25/16, no hydronephrosis.  -SFLC and SPEP + with monoclonal spike and increase L lambda  light chains. -renal biopsy ordered- pending for today 07/02/2016 (needs to be off of Plavix for 5 days)  Shock secondary to hypovolemia and probable sepsis -Blood and urine cultures negative.  -Strep pneumonia and legionella urine antigens negative -Antibiotics narrowed to Levaquin status post 8 days of therapy with Tressie Ellis and vancomycin.  -Repeat chest x-ray showed an interval decrease in bilateral diffuse airspace disease. -WBC now normalized.  -Antibiotics discontinued on 06/24/16.  CAD (coronary artery disease)/demand ischemia -Continue Coreg, labetalol, Imdur, fenofibrate, Zocor and Plavix.  Diabetic ketoacidosis without coma associated with type 2 diabetes mellitus (HCC)/metabolic acidosis -Resolved.  -Continue ISS and lantus with CBG monitoring   Hypertension -Continue Coreg and labetalol.  Hyperlipidemia -Continue fenofibrate and Zocor.  Possible pancreatitis/elevated lipase -Diet has now been advanced. NG tube removed. Poor appetite.  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  -Platelets normalized.  -Repeat SPEP/SFLC as above -hemoglobin 8.5 today, received 1u PRBC on 06/29/2016 -FOBT negative -Anemia panel: adequate iron and iron stores -Continue to monitor CBC -Heparin held  Acute encephalopathy -Back to baseline -Neurontin & Topamax dose adjusted given impaired renal function.   Hypo/hyperphosphatemia -Initially repleted, now high. Likely reflective of progressive renal failure.  -Continue PhosLo, improving.  Hyperkalemia -Potassium 5.3, will give kayexalate  -Continue to monitor   Anxiety -Ativan ordered as needed Q 8 hours. Continue Prozac.  DVT Prophylaxis  Heparin--> SCDs  Code Status: Full  Family Communication: None at bedside  Disposition Plan: Admitted. Pending renal biopsy, monitor hemoglobin  Consultants Nephrology PCCM Inpatient Rehab Interventional raidiology   Procedures  TTE  (06/24/15): EF 55-60%. LA & RA normal in size. RV normal in size and function. No aortic stenosis or regurgitation. Mild mitral  regurgitation. Mild tricuspid regurgitation. Port CXR 6/14: Bilateral hilar fullness and lower lobe opacification with silhouetting of the left hemidiaphragm. Port Abd X-ray 6/14: No free air under diaphragm. Right hip hemiarthroplasty. Bowel gas pattern nonobstructive.  Antibiotics   Anti-infectives    Start     Dose/Rate Route Frequency Ordered Stop   06/23/16 1630  levofloxacin (LEVAQUIN) IVPB 500 mg  Status:  Discontinued     500 mg 100 mL/hr over 60 Minutes Intravenous Every 48 hours 06/21/16 1043 06/24/16 0958   06/21/16 1630  levofloxacin (LEVAQUIN) IVPB 750 mg     750 mg 100 mL/hr over 90 Minutes Intravenous  Once 06/21/16 1043 06/21/16 1720   06/18/16 1700  cefTAZidime (FORTAZ) 1 g in dextrose 5 % 50 mL IVPB  Status:  Discontinued     1 g 100 mL/hr over 30 Minutes Intravenous Every 24 hours 06/17/16 1728 06/21/16 1032   06/18/16 1200  vancomycin (VANCOCIN) IVPB 750 mg/150 ml premix  Status:  Discontinued     750 mg 150 mL/hr over 60 Minutes Intravenous Every 48 hours 06/17/16 1728 06/21/16 1032   06/14/16 1630  vancomycin (VANCOCIN) 500 mg in sodium chloride 0.9 % 100 mL IVPB  Status:  Discontinued     500 mg 100 mL/hr over 60 Minutes Intravenous Every 24 hours 06/13/16 1630 06/17/16 1702   06/13/16 1645  cefTAZidime (FORTAZ) 2 g in dextrose 5 % 50 mL IVPB  Status:  Discontinued     2 g 100 mL/hr over 30 Minutes Intravenous Every 24 hours 06/13/16 1630 06/17/16 1728   06/13/16 1645  vancomycin (VANCOCIN) IVPB 1000 mg/200 mL premix  Status:  Discontinued     1,000 mg 200 mL/hr over 60 Minutes Intravenous  Once 06/13/16 1630 06/13/16 1631   06/13/16 1630  levofloxacin (LEVAQUIN) IVPB 750 mg  Status:  Discontinued     750 mg 100 mL/hr over 90 Minutes Intravenous  Once 06/13/16 1617 06/13/16 1624   06/13/16 1630  aztreonam (AZACTAM) 2 g in dextrose 5 % 50  mL IVPB  Status:  Discontinued     2 g 100 mL/hr over 30 Minutes Intravenous  Once 06/13/16 1617 06/13/16 1624   06/13/16 1630  vancomycin (VANCOCIN) IVPB 1000 mg/200 mL premix     1,000 mg 200 mL/hr over 60 Minutes Intravenous  Once 06/13/16 1617 06/13/16 2105      Subjective:   Melodye Ped seen and examined today.  Has no complaints this morning.  States her renal biopsy is on Wednesday(?). Would like orthopedics to see her for her hip as she had surgery almost one month ago. Denies chest pain, shortness of breath, abdominal pain, headache, dizziness.   Objective:   Filed Vitals:   07/01/16 1432 07/01/16 2100 07/02/16 0415 07/02/16 0638  BP: 162/71 150/65 146/61 157/73  Pulse: 96 86 87 95  Temp: 98.2 F (36.8 C) 98.5 F (36.9 C) 98.3 F (36.8 C)   TempSrc: Oral Oral Oral   Resp: 16 20 20    Height:      Weight:      SpO2: 100% 100% 100%     Intake/Output Summary (Last 24 hours) at 07/02/16 1004 Last data filed at 07/02/16 0800  Gross per 24 hour  Intake      0 ml  Output      0 ml  Net      0 ml   Filed Weights   06/23/16 0500 06/25/16 0207 06/27/16 0430  Weight: 48.2 kg (106 lb 4.2  oz) 55.8 kg (123 lb 0.3 oz) 55.43 kg (122 lb 3.2 oz)    Exam  General: Well developed,elderly, no distress  HEENT: NCAT,  mucous membranes moist.   Cardiovascular: S1 S2 auscultated, no murmurs, RRR  Respiratory: Diminished but Clear, equal chest rise, no wheezing.  Abdomen: Soft, nontender, nondistended, + bowel sounds  Extremities: warm dry without cyanosis clubbing. +LE edema  Neuro: AAOx3, nonfocal  Psych: Appropriae   Data Reviewed: I have personally reviewed following labs and imaging studies  CBC:  Recent Labs Lab 06/28/16 0234 06/29/16 0239 06/30/16 0232 07/01/16 0218 07/02/16 0211  WBC 5.2 6.3 5.9 5.3 5.5  HGB 7.2* 7.2* 8.3* 8.6* 8.5*  HCT 22.8* 23.4* 26.4* 27.0* 27.6*  MCV 96.2 99.2 94.0 96.4 96.8  PLT 213 199 218 210 [id number]   Basic Metabolic  Panel:  Recent Labs Lab 06/26/16 0228 06/27/16 0330 06/28/16 0234 06/29/16 0239 06/30/16 0232 07/01/16 0218 07/02/16 0211  NA 140 139 140 139 139 141 145  K 4.2 4.6 5.3* 5.4* 5.4* 4.9 5.3*  CL 101 105 105 105 104 106 103  CO2 29 24 28 27 28 29 26   GLUCOSE 76 199* 135* 197* 175* 80 164*  BUN 103* 96* 96* 93* 94* 83* 76*  CREATININE 3.62* 3.67* 3.74* 3.78* 3.79* 3.59* 3.46*  CALCIUM 8.0* 8.0* 8.3* 8.6* 8.8* 8.7* 9.8  PHOS 5.9* 5.0*  --   --   --  3.8  --    GFR: Estimated Creatinine Clearance: 13.2 mL/min (by C-G formula based on Cr of 3.46). Liver Function Tests:  Recent Labs Lab 06/26/16 0228 06/27/16 0330  ALBUMIN 1.6* 1.7*   No results for input(s): LIPASE, AMYLASE in the last 168 hours. No results for input(s): AMMONIA in the last 168 hours. Coagulation Profile:  Recent Labs Lab 06/27/16 1134  INR 1.06   Cardiac Enzymes: No results for input(s): CKTOTAL, CKMB, CKMBINDEX, TROPONINI in the last 168 hours. BNP (last 3 results) No results for input(s): PROBNP in the last 8760 hours. HbA1C: No results for input(s): HGBA1C in the last 72 hours. CBG:  Recent Labs Lab 07/01/16 1110 07/01/16 1619 07/01/16 2147 07/01/16 2230 07/02/16 0611  GLUCAP 104* 170* 69 93 128*   Lipid Profile: No results for input(s): CHOL, HDL, LDLCALC, TRIG, CHOLHDL, LDLDIRECT in the last 72 hours. Thyroid Function Tests: No results for input(s): TSH, T4TOTAL, FREET4, T3FREE, THYROIDAB in the last 72 hours. Anemia Panel: No results for input(s): VITAMINB12, FOLATE, FERRITIN, TIBC, IRON, RETICCTPCT in the last 72 hours. Urine analysis:    Component Value Date/Time   COLORURINE AMBER* 06/13/2016 1615   COLORURINE Yellow 05/04/2014 0914   APPEARANCEUR TURBID* 06/13/2016 1615   APPEARANCEUR Clear 05/04/2014 0914   LABSPEC 1.021 06/13/2016 1615   LABSPEC 1.010 05/04/2014 0914   LABSPEC 1.020 02/14/2011 1654   PHURINE 5.0 06/13/2016 1615   PHURINE 5.0 05/04/2014 0914   PHURINE  6.0 02/14/2011 1654   GLUCOSEU 250* 06/13/2016 1615   GLUCOSEU >=500 05/04/2014 0914   HGBUR TRACE* 06/13/2016 1615   HGBUR 1+ 05/04/2014 0914   HGBUR Negative 02/14/2011 1654   HGBUR negative 05/17/2009 1334   BILIRUBINUR SMALL* 06/13/2016 1615   BILIRUBINUR Negative 05/04/2014 0914   BILIRUBINUR Negative 02/14/2011 1654   KETONESUR NEGATIVE 06/13/2016 1615   KETONESUR Negative 05/04/2014 0914   KETONESUR Negative 02/14/2011 1654   PROTEINUR NEGATIVE 06/13/2016 1615   PROTEINUR Negative 05/04/2014 0914   PROTEINUR Negative 02/14/2011 1654   UROBILINOGEN 0.2 09/17/2009 0233   NITRITE  NEGATIVE 06/13/2016 1615   NITRITE Negative 05/04/2014 0914   NITRITE Negative 02/14/2011 1654   LEUKOCYTESUR LARGE* 06/13/2016 1615   LEUKOCYTESUR 2+ 05/04/2014 0914   LEUKOCYTESUR Trace 02/14/2011 1654   Sepsis Labs: @LABRCNTIP (procalcitonin:4,lacticidven:4)  )No results found for this or any previous visit (from the past 240 hour(s)).    Radiology Studies: No results found.   Scheduled Meds: . sodium chloride   Intravenous Once  . antiseptic oral rinse  7 mL Mouth Rinse q12n4p  . calcium acetate  1,334 mg Oral TID WC  . carbamide peroxide  5 drop Right Ear BID  . carvedilol  3.125 mg Oral BID WC  . chlorhexidine  15 mL Mouth Rinse BID  . cholecalciferol  5,000 Units Oral Daily  . [START ON 07/04/2016] clopidogrel  75 mg Oral QHS  . feeding supplement (GLUCERNA SHAKE)  237 mL Oral TID BM  . fenofibrate  160 mg Oral QHS  . FLUoxetine  20 mg Oral Daily  . furosemide  20 mg Intravenous Once  . gabapentin  200 mg Oral TID  . insulin aspart  0-20 Units Subcutaneous TID WC  . insulin aspart  0-5 Units Subcutaneous QHS  . insulin aspart  6 Units Subcutaneous TID WC  . insulin glargine  8 Units Subcutaneous Daily  . isosorbide mononitrate  60 mg Oral Daily  . loratadine  10 mg Oral Daily  . pantoprazole  40 mg Oral BID  . simvastatin  20 mg Oral QHS  . topiramate  50 mg Oral BID    Continuous Infusions:     LOS: 19 days   Time Spent in minutes   30 minutes  Camille Dragan D.O. on 07/02/2016 at 10:04 AM  Between 7am to 7pm - Pager - [phone]  After 7pm go to [email or web address] - password [email or web address]  And look for the night coverage person covering for me after hours  [facility] Office  [phone]

## 2016-07-02 NOTE — Progress Notes (Signed)
PT Cancellation Note  Patient Details Name: [name] MRN: [id number] DOB: 08-20-1951   Cancelled Treatment:    Reason Eval/Treat Not Completed: Other (comment);Medical issues which prohibited therapy (pt on bedrest x 4 hours after biopsy until 1611.  )Will return as able.  Thanks.    Irwin Brakeman F 07/02/2016, 3:17 PM [facility] [phone] (pager)

## 2016-07-02 NOTE — Progress Notes (Signed)
Nutrition Follow Up  DOCUMENTATION CODES:   Not applicable  INTERVENTION:   Continue Glucerna Shake po TID, each supplement provides 220 kcal and 10 grams of protein  NUTRITION DIAGNOSIS:   Inadequate oral intake now related to dysphagia as evidenced by meal completion < 50%, ongoing  GOAL:   Patient will meet greater than or equal to 90% of their needs, progressing  MONITOR:   PO intake, Supplement acceptance, Weight trends, Skin, Labs, I & O's  ASSESSMENT:   65 year old Female with PMH as below, which includes DM, CAD, HTN, and recent admission for R femoral neck fracture s/p arthoplasty 6/8. She had suffered several fall prior to the fall that caused the fracture. Post operative course was fairly uncomplicated with the exception of some mild anemia requiring PRBC transfusion and UTI treated with levaquin. She was discharged to [facility] for rehabilitation on 6/12. 6/14 she returned to ER with chief complaint of AMS with associated hypotension. In the emergency department she was noted to be lethargic and confused. She was complaining of abdominal pain. She was hypotensive and hypothermic with elevated WBC. Glucose noted to be markedly elevated, however urine ketones negative. With metabolic acidosis. Lactic acid wnl. Due to persistent hypotension and concern for septic shock PCCM asked to admit.   Patient currently NPO for procedure >> in ULTRASOUND. Previously on a Carbohydrate Modified diet. PO intake variable at 25-50% per flowsheet records. Was receiving Glucerna Shake oral supplements. CWOCN note 7/2 reviewed.  Diet Order:  Diet NPO time specified Except for: Sips with Meds  Skin:  Wound (see comment) (Stage II to coccyx)   CBG (last 3)   Recent Labs  07/01/16 2147 07/01/16 2230 07/02/16 0611  GLUCAP 69 93 128*    Last BM:  6/25  Height:   Ht Readings from Last 1 Encounters:  06/13/16 5\' 3"  (1.6 m)    Weight:   Wt Readings from Last 1 Encounters:   06/27/16 122 lb 3.2 oz (55.43 kg)    Ideal Body Weight:  52 kg  BMI:  Body mass index is 21.65 kg/(m^2).  Estimated Nutritional Needs:   Kcal:  1500-1700  Protein:  70-80 gm  Fluid:  1.5-1.7 L  EDUCATION NEEDS:   No education needs identified at this time  Arthur Holms, RD, LDN Pager #: [phone] After-Hours Pager #: [phone]

## 2016-07-03 LAB — CBC
HEMATOCRIT: 25.5 % — AB (ref 36.0–46.0)
Hemoglobin: 7.9 g/dL — ABNORMAL LOW (ref 12.0–15.0)
MCH: 30.4 pg (ref 26.0–34.0)
MCHC: 31 g/dL (ref 30.0–36.0)
MCV: 98.1 fL (ref 78.0–100.0)
PLATELETS: 203 10*3/uL (ref 150–400)
RBC: 2.6 MIL/uL — ABNORMAL LOW (ref 3.87–5.11)
RDW: 19.1 % — AB (ref 11.5–15.5)
WBC: 4.7 10*3/uL (ref 4.0–10.5)

## 2016-07-03 LAB — RENAL FUNCTION PANEL
ALBUMIN: 1.8 g/dL — AB (ref 3.5–5.0)
Anion gap: 5 (ref 5–15)
BUN: 70 mg/dL — AB (ref 6–20)
CHLORIDE: 109 mmol/L (ref 101–111)
CO2: 29 mmol/L (ref 22–32)
CREATININE: 3.54 mg/dL — AB (ref 0.44–1.00)
Calcium: 8.8 mg/dL — ABNORMAL LOW (ref 8.9–10.3)
GFR calc Af Amer: 14 mL/min — ABNORMAL LOW (ref >60)
GFR calc non Af Amer: 12 mL/min — ABNORMAL LOW (ref >60)
GLUCOSE: 102 mg/dL — AB (ref 65–99)
POTASSIUM: 5.3 mmol/L — AB (ref 3.5–5.1)
Phosphorus: 3.7 mg/dL (ref 2.5–4.6)
Sodium: 143 mmol/L (ref 135–145)

## 2016-07-03 LAB — GLUCOSE, CAPILLARY
Glucose-Capillary: 119 mg/dL — ABNORMAL HIGH (ref 65–99)
Glucose-Capillary: 182 mg/dL — ABNORMAL HIGH (ref 65–99)
Glucose-Capillary: 184 mg/dL — ABNORMAL HIGH (ref 65–99)
Glucose-Capillary: 54 mg/dL — ABNORMAL LOW (ref 65–99)
Glucose-Capillary: 77 mg/dL (ref 65–99)
Glucose-Capillary: 82 mg/dL (ref 65–99)

## 2016-07-03 MED ORDER — FUROSEMIDE 40 MG PO TABS
40.0000 mg | ORAL_TABLET | Freq: Two times a day (BID) | ORAL | Status: DC
Start: 2016-07-03 — End: 2016-07-04
  Administered 2016-07-03 – 2016-07-04 (×3): 40 mg via ORAL
  Filled 2016-07-03 (×3): qty 1

## 2016-07-03 NOTE — Progress Notes (Signed)
Subjective: Interval History: has no complaint, less swollen.  Objective: Vital signs in last 24 hours: Temp:  [98.1 F (36.7 C)-98.6 F (37 C)] 98.4 F (36.9 C) (07/04 0300) Pulse Rate:  [62-97] 62 (07/04 0300) Resp:  [18-20] 18 (07/04 0300) BP: (135-186)/(59-83) 138/59 mmHg (07/04 0300) SpO2:  [99 %-100 %] 100 % (07/04 0300) Weight:  [55.384 kg (122 lb 1.6 oz)] 55.384 kg (122 lb 1.6 oz) (07/04 0300) Weight change:   Intake/Output from previous day: 07/03 0701 - 07/04 0700 In: 360 [P.O.:360] Out: 1001 [Urine:1000; Stool:1] Intake/Output this shift: Total I/O In: -  Out: 400 [Urine:400]  General appearance: alert, cooperative, cachectic and pale Resp: dullness to percussion LLL and rales bibasilar Cardio: S1, S2 normal and systolic murmur: holosystolic 2/6, blowing at apex GI: soft, non-tender; bowel sounds normal; no masses,  no organomegaly Extremities: edema 2-3+  Lab Results:  Recent Labs  07/02/16 0211 07/03/16 0300  WBC 5.5 4.7  HGB 8.5* 7.9*  HCT 27.6* 25.5*  PLT 220 203   BMET:  Recent Labs  07/02/16 0211 07/03/16 0300  NA 145 143  K 5.3* 5.3*  CL 103 109  CO2 26 29  GLUCOSE 164* 102*  BUN 76* 70*  CREATININE 3.46* 3.54*  CALCIUM 9.8 8.8*   No results for input(s): PTH in the last 72 hours. Iron Studies: No results for input(s): IRON, TIBC, TRANSFERRIN, FERRITIN in the last 72 hours.  Studies/Results: US Biopsy  07/02/2016  INDICATION: Renal failure EXAM: ULTRASOUND-GUIDED BIOPSY RENAL CORTEX.  CORE. MEDICATIONS: None. ANESTHESIA/SEDATION: Fentanyl 50 mcg IV; Versed 1 mg IV Moderate Sedation Time:  10 The patient was continuously monitored during the procedure by the interventional radiology nurse under my direct supervision. FLUOROSCOPY TIME:  Fluoroscopy Time:  minutes  seconds ( mGy). COMPLICATIONS: None immediate. PROCEDURE: Informed written consent was obtained from the patient after a thorough discussion of the procedural risks, benefits and  alternatives. All questions were addressed. Maximal Sterile Barrier Technique was utilized including caps, mask, sterile gowns, sterile gloves, sterile drape, hand hygiene and skin antiseptic. A timeout was performed prior to the initiation of the procedure. The back was prepped with ChloraPrep in a sterile fashion, and a sterile drape was applied covering the operative field. A sterile gown and sterile gloves were used for the procedure. Under sonographic guidance, two 16 gauge core biopsies of the left lower pole renal cortex were obtain. Final imaging was performed. Patient tolerated the procedure well without complication. Vital sign monitoring by nursing staff during the procedure will continue as patient is in the special procedures unit for post procedure observation. FINDINGS: The images document guide needle placement within the left lower pole renal cortex. Post biopsy images demonstrate minimal perinephric hemorrhage. IMPRESSION: Successful ultrasound-guided random renal cortex core biopsy. Electronically Signed   By: Marybelle Killings M.D.   On: 07/02/2016 14:12    I have reviewed the patient's current medications.  Assessment/Plan: 1 CKD 4 with mild worsening.  Had AKI earlier 6/17, now mildly worse.  BX done, results P.  Vol xs, acid/base/K ok.  Needs diuresis, has some response to Lasix, ^ dose  2 MGUS  ? If has MM 3 Hip with sepsis  Stable 4 HTN controlled on meds 5 Anemia mild decrease after bx. 6 DM controlled 7 CAD P ^ Lasix, follow chem, f/u bx, cont ESA    LOS: 20 days   Rylah Fukuda L 07/03/2016,6:54 AM

## 2016-07-03 NOTE — Progress Notes (Signed)
SATURATION QUALIFICATIONS: (This note is used to comply with regulatory documentation for home oxygen)  Patient Saturations on Room Air at Rest = 85-87%  Patient Saturations on Room Air while Ambulating = 80-82%  Patient Saturations on 2.5 Liters of oxygen while Ambulating = 90-93%  Please briefly explain why patient needs home oxygen:Requires O2 to keep sats above 90% at rest and with activity.  Thanks.  [place] [phone] (pager)

## 2016-07-03 NOTE — Progress Notes (Signed)
Physical Therapy Treatment Patient Details Name: [name] MRN: [id number] DOB: Feb 06, 1951 Today's Date: 07/03/2016    History of Present Illness Pt readmitted from SNF with sepsis and respiratory failure. Pt placed on bipap. Pt with recent rt hip fx and hemiarthroplasty on 06/08/16. PMH - DM, CAD, CKD, falls, HTN    PT Comments    Pt admitted with above diagnosis. Pt currently with functional limitations due to balance and endurance deficits.Pt was able to ambulate with RW but complained a lot about pain in right hip.  Pt actually walked further than usual.  Able to perform a few exercises.   Pt will benefit from skilled PT to increase their independence and safety with mobility to allow discharge to the venue listed below.    Follow Up Recommendations  SNF     Equipment Recommendations  None recommended by PT    Recommendations for Other Services       Precautions / Restrictions Precautions Precautions: Fall;Posterior Hip Precaution Booklet Issued: Yes (comment) Precaution Comments: reviewed hip precautions, seems fearful to move the wrong way and though was not to move leg out to side Restrictions RLE Weight Bearing: Weight bearing as tolerated    Mobility  Bed Mobility Overal bed mobility: Needs Assistance Bed Mobility: Supine to Sit     Supine to sit: Min assist     General bed mobility comments: Assist to elevate trunk into sitting  Transfers Overall transfer level: Needs assistance Equipment used: Rolling walker (2 wheeled) Transfers: Sit to/from Stand Sit to Stand: Min assist         General transfer comment: Assist to bring hips up.  Ambulation/Gait Ambulation/Gait assistance: Min guard;Min assist Ambulation Distance (Feet): 50 Feet Assistive device: Rolling walker (2 wheeled) Gait Pattern/deviations: Step-to pattern;Antalgic;Decreased stride length Gait velocity: decr Gait velocity interpretation: Below normal speed for age/gender General  Gait Details: Assist for balance and support   Stairs            Wheelchair Mobility    Modified Rankin (Stroke Patients Only)       Balance Overall balance assessment: Needs assistance Sitting-balance support: No upper extremity supported;Feet supported Sitting balance-Leahy Scale: Fair     Standing balance support: Bilateral upper extremity supported;During functional activity Standing balance-Leahy Scale: Poor Standing balance comment: requires support of RW for balance.                    Cognition Arousal/Alertness: Awake/alert Behavior During Therapy: WFL for tasks assessed/performed Overall Cognitive Status: Within Functional Limits for tasks assessed Area of Impairment: Orientation;Memory Orientation Level: Place;Situation;Time Current Attention Level: Sustained Memory: Decreased recall of precautions;Decreased short-term memory Following Commands: Follows one step commands with increased time Safety/Judgement: Decreased awareness of safety;Decreased awareness of deficits Awareness: Intellectual Problem Solving: Requires verbal cues;Requires tactile cues General Comments: no family present    Exercises Total Joint Exercises Ankle Circles/Pumps: AROM;10 reps;Both;Supine Quad Sets: AROM;Both;5 reps;Supine Heel Slides: AROM;AAROM;Both;10 reps;Supine Hip ABduction/ADduction: AROM;Right;10 reps;Supine    General Comments        Pertinent Vitals/Pain Pain Assessment: 0-10 Pain Score: 6  Pain Location: right hip Pain Descriptors / Indicators: Aching;Guarding;Sore Pain Intervention(s): Limited activity within patient's tolerance;Monitored during session;Repositioned  85-87% on RA.  Needed 2.5L O2 to keep sats 90-93%.    Home Living                      Prior Function            PT  Goals (current goals can now be found in the care plan section) Progress towards PT goals: Progressing toward goals    Frequency  Min 2X/week    PT  Plan Current plan remains appropriate    Co-evaluation             End of Session Equipment Utilized During Treatment: Gait belt, oxygen Activity Tolerance: Patient limited by fatigue Patient left: in chair;with call bell/phone within reach     Time: 0912-0928 PT Time Calculation (min) (ACUTE ONLY): 16 min  Charges:  $Gait Training: 8-22 mins                    G CodesDenice Paradise 07/08/16, 10:15 AM [facility] [phone] (pager)

## 2016-07-03 NOTE — Progress Notes (Signed)
PROGRESS NOTE    [name]  [id number] DOB: [birth year] DOA: 06/13/2016 PCP: [name], MD   Brief Narrative:  HPI on 06/13/2016 by Dr. Cain Saupe (ICU) 65 year old female with PMH as below, which includes DM, CAD, HTN, and recent admission for R femoral neck fracture s/p arthoplasty 6/8. She had suffered several fall prior to the fall that caused the fracture. Post operative course was fairly uncomplicated with the exception of some mild anemia requiring PRBC transfusion and UTI treated with levaquin. She was discharged to [facility] for rehabilitation on 6/12. 6/14 she returned to ER with chief complaint of AMS with associated hypotension. In the emergency department she was noted to be lethargic and confused. She was complaining of abdominal pain. She was hypotensive and hypothermic with elevated WBC. Glucose noted to be markedly elevated, however urine ketones negative. With metabolic acidosis. Lactic acid wnl. Due to persistent hypotension and concern for septic shock PCCM asked to admit.   Interim history Treated for DKA, hypotension, and altered mental status. Found to have AKI.  Nephrology consulted. Renal biopsy on 7/3, results pending. On lasix.   Assessment & Plan   Acute respiratory failure with hypoxemia (HCC) secondary to pulmonary edema with possible left base HCAP -Continue supplemental oxygen and BiPAP at bedtime.  -Continue pulmonary hygiene and oxygen to maintain saturations 88-92 percent.  -Patient desats to 85-87% at rest on room air, with ambulation 80-82%.  -Levaquin discontinued 06/24/16. -Very deconditioned with prolonged hospital stay.  -CIR consulted and felt patient was not a candidate for inpatient rehab -likely will return to SNF  AKI (acute kidney injury) (HCC)/stage III-IV chronic kidney disease -Baseline creatinine 1.9-2.0. -Current creatinine remains elevated, today 3.54 -Nephrology consulted and appreciated.  -Renal ultrasound on  06/25/16, no hydronephrosis.  -SFLC and SPEP + with monoclonal spike and increase L lambda light chains. -renal biopsy 07/02/2016, results pending  -UOP 1000cc over past 24hrs -Restarted on lasix  Shock secondary to hypovolemia and probable sepsis -Blood and urine cultures negative.  -Strep pneumonia and legionella urine antigens negative -Antibiotics narrowed to Levaquin status post 8 days of therapy with Tressie Ellis and vancomycin.  -Repeat chest x-ray showed an interval decrease in bilateral diffuse airspace disease. -WBC now normalized.  -Antibiotics discontinued on 06/24/16.  CAD (coronary artery disease)/demand ischemia -Continue Coreg, labetalol, Imdur, fenofibrate, Zocor and Plavix.  Diabetic ketoacidosis without coma associated with type 2 diabetes mellitus (HCC)/metabolic acidosis -Resolved.  -Continue ISS and lantus with CBG monitoring   Hypertension -Continue Coreg and labetalol.  Hyperlipidemia -Continue fenofibrate and Zocor.  Possible pancreatitis/elevated lipase -Diet has now been advanced. NG tube removed. Poor appetite.  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  -Platelets normalized.  -Repeat SPEP/SFLC as above -hemoglobin 7.9 today, received 1u PRBC on 06/29/2016 -FOBT negative -Anemia panel: adequate iron and iron stores -Continue to monitor CBC -Heparin held  Acute encephalopathy -Back to baseline -Neurontin & Topamax dose adjusted given impaired renal function.   Hypo/hyperphosphatemia -Initially repleted, now high. Likely reflective of progressive renal failure.  -Continue PhosLo, improving.  Hyperkalemia -Potassium 5.3, will give kayexalate, on lasix -Continue to monitor   Anxiety -Ativan ordered as needed Q 8 hours. Continue Prozac.  Right hip surgery -s/p arthroplasty 06/07/2016 -Have tried calling ortho office, closed.  Sutures still in place.   DVT Prophylaxis  Heparin--> SCDs  Code Status:  Full  Family Communication: None at bedside  Disposition Plan: Admitted. Pending renal biopsy, monitor hemoglobin  Consultants Nephrology PCCM Inpatient Rehab Interventional raidiology   Procedures  TTE (06/24/15): EF 55-60%. LA & RA normal in size. RV normal in size and function. No aortic stenosis or regurgitation. Mild mitral regurgitation. Mild tricuspid regurgitation. Port CXR 6/14: Bilateral hilar fullness and lower lobe opacification with silhouetting of the left hemidiaphragm. Port Abd X-ray 6/14: No free air under diaphragm. Right hip hemiarthroplasty. Bowel gas pattern nonobstructive. 7/3: Renal Bx  Antibiotics   Anti-infectives    Start     Dose/Rate Route Frequency Ordered Stop   06/23/16 1630  levofloxacin (LEVAQUIN) IVPB 500 mg  Status:  Discontinued     500 mg 100 mL/hr over 60 Minutes Intravenous Every 48 hours 06/21/16 1043 06/24/16 0958   06/21/16 1630  levofloxacin (LEVAQUIN) IVPB 750 mg     750 mg 100 mL/hr over 90 Minutes Intravenous  Once 06/21/16 1043 06/21/16 1720   06/18/16 1700  cefTAZidime (FORTAZ) 1 g in dextrose 5 % 50 mL IVPB  Status:  Discontinued     1 g 100 mL/hr over 30 Minutes Intravenous Every 24 hours 06/17/16 1728 06/21/16 1032   06/18/16 1200  vancomycin (VANCOCIN) IVPB 750 mg/150 ml premix  Status:  Discontinued     750 mg 150 mL/hr over 60 Minutes Intravenous Every 48 hours 06/17/16 1728 06/21/16 1032   06/14/16 1630  vancomycin (VANCOCIN) 500 mg in sodium chloride 0.9 % 100 mL IVPB  Status:  Discontinued     500 mg 100 mL/hr over 60 Minutes Intravenous Every 24 hours 06/13/16 1630 06/17/16 1702   06/13/16 1645  cefTAZidime (FORTAZ) 2 g in dextrose 5 % 50 mL IVPB  Status:  Discontinued     2 g 100 mL/hr over 30 Minutes Intravenous Every 24 hours 06/13/16 1630 06/17/16 1728   06/13/16 1645  vancomycin (VANCOCIN) IVPB 1000 mg/200 mL premix  Status:  Discontinued     1,000 mg 200 mL/hr over 60 Minutes Intravenous  Once 06/13/16 1630  06/13/16 1631   06/13/16 1630  levofloxacin (LEVAQUIN) IVPB 750 mg  Status:  Discontinued     750 mg 100 mL/hr over 90 Minutes Intravenous  Once 06/13/16 1617 06/13/16 1624   06/13/16 1630  aztreonam (AZACTAM) 2 g in dextrose 5 % 50 mL IVPB  Status:  Discontinued     2 g 100 mL/hr over 30 Minutes Intravenous  Once 06/13/16 1617 06/13/16 1624   06/13/16 1630  vancomycin (VANCOCIN) IVPB 1000 mg/200 mL premix     1,000 mg 200 mL/hr over 60 Minutes Intravenous  Once 06/13/16 1617 06/13/16 2105      Subjective:   [name] seen and examined today.  Complains of not feeling well and not having an appetite.  Feels anxious. Denies chest pain, shortness of breath, abdominal pain, headache, dizziness.   Objective:   Filed Vitals:   07/02/16 1150 07/02/16 1400 07/02/16 2045 07/03/16 0300  BP: 143/64 144/69 135/59 138/59  Pulse: 95 94 62 62  Temp:  98.1 F (36.7 C) 98.6 F (37 C) 98.4 F (36.9 C)  TempSrc:  Oral Oral Oral  Resp: 19 18 18 18   Height:      Weight:    55.384 kg (122 lb 1.6 oz)  SpO2: 100% 99% 100% 100%    Intake/Output Summary (Last 24 hours) at 07/03/16 1112 Last data filed at 07/03/16 0815  Gross per 24 hour  Intake    480 ml  Output   1000 ml  Net   -520 ml  Filed Weights   06/25/16 0207 06/27/16 0430 07/03/16 0300  Weight: 55.8 kg (123 lb 0.3 oz) 55.43 kg (122 lb 3.2 oz) 55.384 kg (122 lb 1.6 oz)    Exam  General: Well developed,elderly, no apparent distress  HEENT: NCAT,  mucous membranes moist.   Cardiovascular: S1 S2 auscultated, +SEM, RRR  Respiratory: Diminished but Clear, equal chest rise, no wheezing.  Abdomen: Soft, nontender, nondistended, + bowel sounds  Extremities: warm dry without cyanosis clubbing. +LE edema  Neuro: AAOx3, nonfocal  Psych:Flat affect, depressed mood   Data Reviewed: I have personally reviewed following labs and imaging studies  CBC:  Recent Labs Lab 06/29/16 0239 06/30/16 0232 07/01/16 0218  07/02/16 0211 07/03/16 0300  WBC 6.3 5.9 5.3 5.5 4.7  HGB 7.2* 8.3* 8.6* 8.5* 7.9*  HCT 23.4* 26.4* 27.0* 27.6* 25.5*  MCV 99.2 94.0 96.4 96.8 98.1  PLT 199 218 210 220 [id number]   Basic Metabolic Panel:  Recent Labs Lab 06/27/16 0330  06/29/16 0239 06/30/16 0232 07/01/16 0218 07/02/16 0211 07/03/16 0300  NA 139  < > 139 139 141 145 143  K 4.6  < > 5.4* 5.4* 4.9 5.3* 5.3*  CL 105  < > 105 104 106 103 109  CO2 24  < > 27 28 29 26 29   GLUCOSE 199*  < > 197* 175* 80 164* 102*  BUN 96*  < > 93* 94* 83* 76* 70*  CREATININE 3.67*  < > 3.78* 3.79* 3.59* 3.46* 3.54*  CALCIUM 8.0*  < > 8.6* 8.8* 8.7* 9.8 8.8*  PHOS 5.0*  --   --   --  3.8  --  3.7  < > = values in this interval not displayed. GFR: Estimated Creatinine Clearance: 12.9 mL/min (by C-G formula based on Cr of 3.54). Liver Function Tests:  Recent Labs Lab 06/27/16 0330 07/03/16 0300  ALBUMIN 1.7* 1.8*   No results for input(s): LIPASE, AMYLASE in the last 168 hours. No results for input(s): AMMONIA in the last 168 hours. Coagulation Profile:  Recent Labs Lab 06/27/16 1134 07/02/16 1350  INR 1.06 1.05   Cardiac Enzymes: No results for input(s): CKTOTAL, CKMB, CKMBINDEX, TROPONINI in the last 168 hours. BNP (last 3 results) No results for input(s): PROBNP in the last 8760 hours. HbA1C: No results for input(s): HGBA1C in the last 72 hours. CBG:  Recent Labs Lab 07/02/16 1319 07/02/16 1622 07/02/16 2053 07/03/16 0032 07/03/16 0624  GLUCAP 102* 154* 66 77 119*   Lipid Profile: No results for input(s): CHOL, HDL, LDLCALC, TRIG, CHOLHDL, LDLDIRECT in the last 72 hours. Thyroid Function Tests: No results for input(s): TSH, T4TOTAL, FREET4, T3FREE, THYROIDAB in the last 72 hours. Anemia Panel: No results for input(s): VITAMINB12, FOLATE, FERRITIN, TIBC, IRON, RETICCTPCT in the last 72 hours. Urine analysis:    Component Value Date/Time   COLORURINE AMBER* 06/13/2016 1615   COLORURINE Yellow 05/04/2014  0914   APPEARANCEUR TURBID* 06/13/2016 1615   APPEARANCEUR Clear 05/04/2014 0914   LABSPEC 1.021 06/13/2016 1615   LABSPEC 1.010 05/04/2014 0914   LABSPEC 1.020 02/14/2011 1654   PHURINE 5.0 06/13/2016 1615   PHURINE 5.0 05/04/2014 0914   PHURINE 6.0 02/14/2011 1654   GLUCOSEU 250* 06/13/2016 1615   GLUCOSEU >=500 05/04/2014 0914   HGBUR TRACE* 06/13/2016 1615   HGBUR 1+ 05/04/2014 0914   HGBUR Negative 02/14/2011 1654   HGBUR negative 05/17/2009 1334   BILIRUBINUR SMALL* 06/13/2016 1615   BILIRUBINUR Negative 05/04/2014 0914   BILIRUBINUR Negative 02/14/2011 1654  KETONESUR NEGATIVE 06/13/2016 1615   KETONESUR Negative 05/04/2014 0914   KETONESUR Negative 02/14/2011 1654   PROTEINUR NEGATIVE 06/13/2016 1615   PROTEINUR Negative 05/04/2014 0914   PROTEINUR Negative 02/14/2011 1654   UROBILINOGEN 0.2 09/17/2009 0233   NITRITE NEGATIVE 06/13/2016 1615   NITRITE Negative 05/04/2014 0914   NITRITE Negative 02/14/2011 1654   LEUKOCYTESUR LARGE* 06/13/2016 1615   LEUKOCYTESUR 2+ 05/04/2014 0914   LEUKOCYTESUR Trace 02/14/2011 1654   Sepsis Labs: @LABRCNTIP (procalcitonin:4,lacticidven:4)  )No results found for this or any previous visit (from the past 240 hour(s)).    Radiology Studies: US Biopsy  07/02/2016  INDICATION: Renal failure EXAM: ULTRASOUND-GUIDED BIOPSY RENAL CORTEX.  CORE. MEDICATIONS: None. ANESTHESIA/SEDATION: Fentanyl 50 mcg IV; Versed 1 mg IV Moderate Sedation Time:  10 The patient was continuously monitored during the procedure by the interventional radiology nurse under my direct supervision. FLUOROSCOPY TIME:  Fluoroscopy Time:  minutes  seconds ( mGy). COMPLICATIONS: None immediate. PROCEDURE: Informed written consent was obtained from the patient after a thorough discussion of the procedural risks, benefits and alternatives. All questions were addressed. Maximal Sterile Barrier Technique was utilized including caps, mask, sterile gowns, sterile gloves, sterile  drape, hand hygiene and skin antiseptic. A timeout was performed prior to the initiation of the procedure. The back was prepped with ChloraPrep in a sterile fashion, and a sterile drape was applied covering the operative field. A sterile gown and sterile gloves were used for the procedure. Under sonographic guidance, two 16 gauge core biopsies of the left lower pole renal cortex were obtain. Final imaging was performed. Patient tolerated the procedure well without complication. Vital sign monitoring by nursing staff during the procedure will continue as patient is in the special procedures unit for post procedure observation. FINDINGS: The images document guide needle placement within the left lower pole renal cortex. Post biopsy images demonstrate minimal perinephric hemorrhage. IMPRESSION: Successful ultrasound-guided random renal cortex core biopsy. Electronically Signed   By: Marybelle Killings M.D.   On: 07/02/2016 14:12     Scheduled Meds: . sodium chloride   Intravenous Once  . antiseptic oral rinse  7 mL Mouth Rinse q12n4p  . calcium acetate  1,334 mg Oral TID WC  . carbamide peroxide  5 drop Right Ear BID  . carvedilol  3.125 mg Oral BID WC  . chlorhexidine  15 mL Mouth Rinse BID  . cholecalciferol  5,000 Units Oral Daily  . [START ON 07/04/2016] clopidogrel  75 mg Oral QHS  . feeding supplement (GLUCERNA SHAKE)  237 mL Oral TID BM  . fenofibrate  160 mg Oral QHS  . FLUoxetine  20 mg Oral Daily  . furosemide  40 mg Oral BID  . gabapentin  200 mg Oral TID  . insulin aspart  0-20 Units Subcutaneous TID WC  . insulin aspart  0-5 Units Subcutaneous QHS  . insulin aspart  6 Units Subcutaneous TID WC  . insulin glargine  8 Units Subcutaneous Daily  . isosorbide mononitrate  60 mg Oral Daily  . loratadine  10 mg Oral Daily  . pantoprazole  40 mg Oral BID  . simvastatin  20 mg Oral QHS  . topiramate  50 mg Oral BID   Continuous Infusions:     LOS: 20 days   Time Spent in minutes   30  minutes  Malakhi Markwood D.O. on 07/03/2016 at 11:12 AM  Between 7am to 7pm - Pager - [phone]  After 7pm go to [email or web address] - password [email or web address]  And look  for the night coverage person covering for me after hours  [facility] Office  [phone]

## 2016-07-04 ENCOUNTER — Inpatient Hospital Stay (HOSPITAL_COMMUNITY): Payer: Commercial Managed Care - HMO

## 2016-07-04 LAB — PARATHYROID HORMONE, INTACT (NO CA): PTH: 15 pg/mL (ref 15–65)

## 2016-07-04 LAB — CBC
HCT: 23.4 % — ABNORMAL LOW (ref 36.0–46.0)
HEMOGLOBIN: 7.3 g/dL — AB (ref 12.0–15.0)
MCH: 30.4 pg (ref 26.0–34.0)
MCHC: 31.2 g/dL (ref 30.0–36.0)
MCV: 97.5 fL (ref 78.0–100.0)
Platelets: 198 10*3/uL (ref 150–400)
RBC: 2.4 MIL/uL — AB (ref 3.87–5.11)
RDW: 18.8 % — ABNORMAL HIGH (ref 11.5–15.5)
WBC: 3.6 10*3/uL — ABNORMAL LOW (ref 4.0–10.5)

## 2016-07-04 LAB — RENAL FUNCTION PANEL
ALBUMIN: 1.7 g/dL — AB (ref 3.5–5.0)
ANION GAP: 6 (ref 5–15)
BUN: 66 mg/dL — ABNORMAL HIGH (ref 6–20)
CALCIUM: 8.6 mg/dL — AB (ref 8.9–10.3)
CO2: 31 mmol/L (ref 22–32)
Chloride: 106 mmol/L (ref 101–111)
Creatinine, Ser: 3.32 mg/dL — ABNORMAL HIGH (ref 0.44–1.00)
GFR calc non Af Amer: 13 mL/min — ABNORMAL LOW (ref >60)
GFR, EST AFRICAN AMERICAN: 16 mL/min — AB (ref >60)
Glucose, Bld: 135 mg/dL — ABNORMAL HIGH (ref 65–99)
PHOSPHORUS: 3.6 mg/dL (ref 2.5–4.6)
Potassium: 4.5 mmol/L (ref 3.5–5.1)
Sodium: 143 mmol/L (ref 135–145)

## 2016-07-04 LAB — GLUCOSE, CAPILLARY
GLUCOSE-CAPILLARY: 78 mg/dL (ref 65–99)
Glucose-Capillary: 188 mg/dL — ABNORMAL HIGH (ref 65–99)

## 2016-07-04 MED ORDER — INSULIN GLARGINE 100 UNIT/ML ~~LOC~~ SOLN: 8.0000 [IU] | Freq: Every day | SUBCUTANEOUS | Status: DC

## 2016-07-04 MED ORDER — FUROSEMIDE 40 MG PO TABS: 40.0000 mg | ORAL_TABLET | Freq: Every day | ORAL | Status: DC

## 2016-07-04 MED ORDER — INSULIN GLARGINE 100 UNIT/ML ~~LOC~~ SOLN: 5.0000 [IU] | Freq: Every day | SUBCUTANEOUS | Status: DC

## 2016-07-04 MED ORDER — DARBEPOETIN ALFA 60 MCG/0.3ML IJ SOSY: 60.0000 ug | PREFILLED_SYRINGE | INTRAMUSCULAR | Status: DC

## 2016-07-04 MED ORDER — CALCIUM ACETATE (PHOS BINDER) 667 MG PO CAPS: 1334.0000 mg | ORAL_CAPSULE | Freq: Three times a day (TID) | ORAL | Status: AC

## 2016-07-04 MED ORDER — PANTOPRAZOLE SODIUM 40 MG PO TBEC: 40.0000 mg | DELAYED_RELEASE_TABLET | Freq: Two times a day (BID) | ORAL | Status: AC

## 2016-07-04 MED ORDER — GLUCERNA SHAKE PO LIQD: 237.0000 mL | Freq: Three times a day (TID) | ORAL | Status: DC

## 2016-07-04 NOTE — Clinical Social Work Note (Signed)
CSW met with patient. She requested CSW call her sister [name] to help her decide on SNF. CSW spoke with [name]. [name] and the patient both decided that patient will return to [facility]. CSW called patient's insurance to start authorization.   [employer], LCSW [phone]

## 2016-07-04 NOTE — Clinical Social Work Note (Signed)
CSW received insurance authorization [id number].   [employer], LCSW [phone]

## 2016-07-04 NOTE — Discharge Summary (Addendum)
Physician Discharge Summary  [name] [id number] DOB: [birth year] DOA: 06/13/2016  PCP: [name], MD  Admit date: 06/13/2016 Discharge date: 07/04/2016  Admitted From: SNF Disposition:  snf  Recommendations for Outpatient Follow-up:  1. Follow up with PCP in 1-2 weeks 2. Please obtain BMP/CBC in one week 3. Please follow up owith orthopedics in 1 week    Discharge Condition:guarded.  CODE STATUS: full code.  Diet recommendation: Heart Healthy / Carb Modified /  Brief/Interim Summary: 65 year old female with PMH as below, which includes DM, CAD, HTN, and recent admission for R femoral neck fracture s/p arthoplasty 6/8. She had suffered several fall prior to the fall that caused the fracture. Post operative course was fairly uncomplicated with the exception of some mild anemia requiring PRBC transfusion and UTI treated with levaquin. She was discharged to [facility] for rehabilitation on 6/12. 6/14 she returned to ER with chief complaint of AMS with associated hypotension. In the emergency department she was noted to be lethargic and confused. She was complaining of abdominal pain. She was hypotensive and hypothermic with elevated WBC. Glucose noted to be markedly elevated, however urine ketones negative Lactic acid wnl. Due to persistent hypotension and concern for septic shock PCCM asked to admit.  She was found ot be in DKA, and AKI. DKA resolved and AKI s improving. Nephrology consulted and recommendations given.   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  -Continue pulmonary hygiene and oxygen to maintain saturations 88-92 percent.  -Patient desats to 85-87% at rest on room air, with ambulation 80-82%.  -Levaquin discontinued 06/24/16. -Very deconditioned with prolonged hospital stay.  -CIR consulted and felt patient was not a candidate for inpatient rehab -likely will return to SNF  AKI (acute kidney injury) (HCC)/stage III-IV chronic kidney disease - probably from ATN. -Baseline creatinine 1.9-2.0. -creatinine improving.  -Nephrology consulted and appreciated. recommend outpatient follow up .  -Renal ultrasound on 06/25/16, no hydronephrosis.  -SFLC and SPEP + with monoclonal spike and increase L lambda light chains. -renal biopsy 07/02/2016, results pending  -Restarted on lasix , change to daily lasix from 7/5  Shock secondary to hypovolemia and probable sepsis -Blood and urine cultures negative.  -Strep pneumonia and legionella urine antigens negative -Antibiotics narrowed to Levaquin status post 8 days of therapy with Tressie Ellis and vancomycin.  -Repeat chest x-ray showed an interval decrease in bilateral diffuse airspace disease. -WBC now normalized.  -Antibiotics discontinued on 06/24/16.  CAD (coronary artery disease)/demand ischemia -Continue Coreg, labetalol, Imdur, fenofibrate, Zocor and Plavix.  Diabetic ketoacidosis without coma  associated with type 2 diabetes mellitus (HCC)/metabolic  acidosis -Resolved.  - CBG (last 3)   Recent Labs  07/03/16 2201 07/04/16 0623 07/04/16 1124  GLUCAP 82 78 188*    D/c  premeal coverage.  Decrease the lantus to 5 units.   Hypertension -Continue Coreg and labetalol.  Hyperlipidemia -Continue fenofibrate and Zocor.  Possible pancreatitis/elevated lipase -Diet has now been advanced. NG tube removed. Poor appetite.  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  -Platelets normalized.  -hemoglobin stable, received 1u PRBC on 06/29/2016 -FOBT negative -Anemia panel: adequate iron and iron stores -Continue to monitor CBC   Acute encephalopathy, probably metabolic and toxic. -Back to baseline -Neurontin & Topamax dose adjusted given impaired renal function.   Hypo/hyperphosphatemia -Initially repleted, now high. Likely reflective of progressive renal failure.  -Continue PhosLo, improving.  Hyperkalemia -resolved.  -Continue to monitor   Anxiety resume home meds.   Right hip surgery -s/p arthroplasty 06/07/2016 - recommend outpatient follow up with orthopedics for suture removal.   Stage 2 Pressure Ulcer over coccyx with dermatitis present.    mild to moderate Protein energy malnutrition: nutrition consulted and recommendations given.    Discharge Instructions     Medication List    STOP taking these medications        amLODipine 5 MG tablet  Commonly known as:  NORVASC     enoxaparin 30 MG/0.3ML injection  Commonly known as:  LOVENOX     HUMALOG 100 UNIT/ML injection  Generic drug:  insulin lispro     lisinopril 5 MG tablet  Commonly known as:  PRINIVIL,ZESTRIL      TAKE these medications        acetaminophen 325 MG tablet  Commonly known as:  TYLENOL  Take 2 tablets (650 mg total) by mouth every 6 (six) hours as needed for mild pain (or Fever >/= 101).     ALPRAZolam 0.5 MG tablet  Commonly known as:  XANAX  Take 1 tablet (0.5 mg total) by  mouth 3 (three) times daily as needed for anxiety.     calcium acetate 667 MG capsule  Commonly known as:  PHOSLO  Take 2 capsules (1,334 mg total) by mouth 3 (three) times daily with meals.     carvedilol 3.125 MG tablet  Commonly known as:  COREG  Take 1 tablet (3.125 mg total) by mouth 2 (two) times daily with a meal.     clopidogrel 75 MG tablet  Commonly known as:  PLAVIX  Take 75 mg by mouth at bedtime.     diphenoxylate-atropine 2.5-0.025 MG/5ML liquid  Commonly known as:  LOMOTIL  Take 10 mLs by mouth 4 (four) times daily as needed for diarrhea or loose stools.     feeding supplement (ENSURE ENLIVE) Liqd  Take 237 mLs by mouth 2 (two) times daily between meals.     feeding supplement (GLUCERNA SHAKE) Liqd  Take 237 mLs by mouth 3 (three) times daily between meals.     Fenofibric Acid 135 MG Cpdr  Take 135 mg by mouth at bedtime.     FLUoxetine 20 MG capsule  Commonly known as:  PROZAC  Take 20 mg by mouth daily.     furosemide 40 MG tablet  Commonly known as:  LASIX  Take 1 tablet (40 mg total) by mouth daily.  Start taking on:  07/05/2016     gabapentin 300 MG capsule  Commonly known as:  NEURONTIN  Take 600 mg by  mouth 3 (three) times daily.     GLUCAGON EMERGENCY IJ  Inject 1 mg as directed once as needed (for severe hypoglycemia). Reported on 06/13/2016     insulin glargine 100 UNIT/ML injection  Commonly known as:  LANTUS  Inject 0.05 mLs (5 Units total) into the skin daily.  Start taking on:  07/05/2016     isosorbide mononitrate 60 MG 24 hr tablet  Commonly known as:  IMDUR  Take 1 tablet (60 mg total) by mouth daily.     lip balm Oint  Apply 1 application topically as needed for lip care.     nitroGLYCERIN 0.4 MG SL tablet  Commonly known as:  NITROSTAT  Place 0.4 mg under the tongue every 5 (five) minutes as needed for chest pain. Reported on 06/13/2016     pantoprazole 40 MG tablet  Commonly known as:  PROTONIX  Take 1 tablet (40 mg total) by  mouth 2 (two) times daily.     simvastatin 20 MG tablet  Commonly known as:  ZOCOR  Take 20 mg by mouth at bedtime.     topiramate 100 MG tablet  Commonly known as:  TOPAMAX  Take 100 mg by mouth 2 (two) times daily.     Vitamin D3 5000 units Tabs  Take 5,000 Units by mouth daily.       Follow-up Information    Follow up with [facility].   Specialty:  [place]   Contact information:   [address] [place] [phone]      Follow up with [name], MD. Schedule an appointment as soon as possible for a visit in 1 week.   Specialty:  Family Medicine   Why:  post hospitalization visit.    Contact information:   [place] [address] [phone]       Follow up with Rozanna Box, MD. Schedule an appointment as soon as possible for a visit in 1 week.   Specialty:  Orthopedic Surgery   Why:  for suture removal.    Contact information:   [address] [place] [address] [phone]      Allergies  Allergen Reactions  . Codeine Other (See Comments)    Noted as an allergy on MAR  . Hydralazine Hcl Swelling  . Bisphosphonates Rash  . Codeine Phosphate Nausea And Vomiting and Rash  . Penicillins Itching, Rash and Other (See Comments)    Has patient had a PCN reaction causing immediate rash, facial/tongue/throat swelling, SOB or lightheadedness with hypotension: No Has patient had a PCN reaction causing severe rash involving mucus membranes or skin necrosis: No Has patient had a PCN reaction that required hospitalization No Has patient had a PCN reaction occurring within the last 10 years: No If all of the above answers are "NO", then may proceed with Cephalosporin use.  Pt tolerant to cephalosporins   . Sulfa Antibiotics Itching and Rash    Consultations:  nephrology   Procedures/Studies: Dg Chest 1 View  06/15/2016  CLINICAL DATA:   Pneumonia EXAM: CHEST 1 VIEW COMPARISON:  06/13/2016 FINDINGS: Cardiac shadow is stable. Significant increase in bilateral infiltrates is seen. A component of this may be related to pulmonary edema as well. No bony abnormality is seen. IMPRESSION: Significant increase in bilateral infiltrates. Electronically Signed   By: Inez Catalina M.D.   On: 06/15/2016 11:25   Dg Chest 1 View  06/05/2016  CLINICAL  DATA:  Status post fall today with obvious deformity of the right leg. EXAM: CHEST 1 VIEW COMPARISON:  February 07, 2016. FINDINGS: The heart size and mediastinal contours are stable. The heart size is mildly enlarged. Both lungs are clear. The visualized skeletal structures are stable. IMPRESSION: No active cardiopulmonary disease. Electronically Signed   By: Abelardo Diesel M.D.   On: 06/05/2016 18:36   Abd 1 View (kub)  06/06/2016  CLINICAL DATA:  Chronic diarrhea for 3-4 years. Large distal colonic stool burden on recent CT. EXAM: ABDOMEN - 1 VIEW COMPARISON:  CT on 03/11/2016 FINDINGS: No evidence of dilated bowel loops. A small to moderate amount of stool is seen within the descending and rectosigmoid colon. IMPRESSION: No acute findings.  Small to moderate stool burden again noted. Electronically Signed   By: Earle Gell M.D.   On: 06/06/2016 08:24   Ct Head Wo Contrast  06/05/2016  CLINICAL DATA:  65 year old female with fall and trauma to the head. EXAM: CT HEAD WITHOUT CONTRAST CT CERVICAL SPINE WITHOUT CONTRAST TECHNIQUE: Multidetector CT imaging of the head and cervical spine was performed following the standard protocol without intravenous contrast. Multiplanar CT image reconstructions of the cervical spine were also generated. COMPARISON:  Head CT dated 06/28/2015 FINDINGS: CT HEAD FINDINGS The ventricles and sulci are appropriate in size for patient's age. Mild periventricular and deep white matter chronic microvascular ischemic changes noted. There is no acute intracranial hemorrhage. No mass effect  or midline shift noted. There is partial opacification of the right sphenoid sinus. The remainder of the visualized paranasal sinuses and mastoid air cells are clear. The calvarium is intact. CT CERVICAL SPINE FINDINGS There is no acute fracture or subluxation of the cervical spine.There is osteopenia with minimal degenerative changes.The odontoid and spinous processes are intact.There is normal anatomic alignment of the C1-C2 lateral masses. The visualized soft tissues appear unremarkable. IMPRESSION: No acute intracranial hemorrhage. No Acute/traumatic cervical spine pathology. Electronically Signed   By: Anner Crete M.D.   On: 06/05/2016 20:34   Ct Cervical Spine Wo Contrast  06/05/2016  CLINICAL DATA:  65 year old female with fall and trauma to the head. EXAM: CT HEAD WITHOUT CONTRAST CT CERVICAL SPINE WITHOUT CONTRAST TECHNIQUE: Multidetector CT imaging of the head and cervical spine was performed following the standard protocol without intravenous contrast. Multiplanar CT image reconstructions of the cervical spine were also generated. COMPARISON:  Head CT dated 06/28/2015 FINDINGS: CT HEAD FINDINGS The ventricles and sulci are appropriate in size for patient's age. Mild periventricular and deep white matter chronic microvascular ischemic changes noted. There is no acute intracranial hemorrhage. No mass effect or midline shift noted. There is partial opacification of the right sphenoid sinus. The remainder of the visualized paranasal sinuses and mastoid air cells are clear. The calvarium is intact. CT CERVICAL SPINE FINDINGS There is no acute fracture or subluxation of the cervical spine.There is osteopenia with minimal degenerative changes.The odontoid and spinous processes are intact.There is normal anatomic alignment of the C1-C2 lateral masses. The visualized soft tissues appear unremarkable. IMPRESSION: No acute intracranial hemorrhage. No Acute/traumatic cervical spine pathology. Electronically  Signed   By: Anner Crete M.D.   On: 06/05/2016 20:34   US Abdomen Complete  06/13/2016  CLINICAL DATA:  Acute onset of generalized abdominal pain. Elevated lipase and bilirubin. Initial encounter. EXAM: ABDOMEN ULTRASOUND COMPLETE COMPARISON:  CT of the abdomen and pelvis performed 03/11/2016, and renal ultrasound performed 02/08/2016 FINDINGS: Gallbladder: No gallstones or wall thickening visualized. Trace sludge  is noted within the gallbladder. No sonographic Murphy sign noted by sonographer. Common bile duct: Diameter: 0.3 cm, within normal limits in caliber. Liver: No focal lesion identified. Within normal limits in parenchymal echogenicity. Trace fluid is noted adjacent to the gallbladder fossa. IVC: No abnormality visualized. Pancreas: Visualized portion unremarkable. Spleen: Size and appearance within normal limits. Right Kidney: Length: 11.1 cm. Increased renal parenchymal echogenicity noted. Trace right-sided perinephric fluid is seen. No mass or hydronephrosis visualized. Left Kidney: Length: 10.0 cm. Increased renal parenchymal echogenicity noted. No mass or hydronephrosis visualized. Abdominal aorta: No aneurysm visualized. Scattered calcification is seen along the abdominal aorta. Other findings: Small bilateral pleural effusions are seen. IMPRESSION: 1. No acute abnormality seen within the abdomen. 2. Trace sludge within the gallbladder. Gallbladder otherwise unremarkable. 3. Increased renal parenchymal echogenicity likely reflects medical renal disease. 4. Trace fluid noted adjacent to the gallbladder fossa. 5. Trace right-sided perinephric fluid seen. 6. Small bilateral pleural effusions noted. 7. Scattered calcification along the abdominal aorta. Electronically Signed   By: Garald Balding M.D.   On: 06/13/2016 23:36   US Renal  06/25/2016  CLINICAL DATA:  65 year old diabetic hypertensive female with stage 3 kidney disease. Subsequent encounter. EXAM: RENAL / URINARY TRACT ULTRASOUND  COMPLETE COMPARISON:  06/13/2016 ultrasound.  03/11/2016 CT. FINDINGS: Right Kidney: Length: 10.6 cm. Echogenicity within normal limits. No mass or hydronephrosis visualized. Left Kidney: Length: 10.7 cm. Echogenicity within normal limits. No mass or hydronephrosis visualized. Bladder: Appears normal for degree of bladder distention. IMPRESSION: No hydronephrosis. Limited evaluation of bladder secondary to decompression. Right-sided pleural effusion suspected. Electronically Signed   By: Genia Del M.D.   On: 06/25/2016 17:13   US Biopsy  07/02/2016  INDICATION: Renal failure EXAM: ULTRASOUND-GUIDED BIOPSY RENAL CORTEX.  CORE. MEDICATIONS: None. ANESTHESIA/SEDATION: Fentanyl 50 mcg IV; Versed 1 mg IV Moderate Sedation Time:  10 The patient was continuously monitored during the procedure by the interventional radiology nurse under my direct supervision. FLUOROSCOPY TIME:  Fluoroscopy Time:  minutes  seconds ( mGy). COMPLICATIONS: None immediate. PROCEDURE: Informed written consent was obtained from the patient after a thorough discussion of the procedural risks, benefits and alternatives. All questions were addressed. Maximal Sterile Barrier Technique was utilized including caps, mask, sterile gowns, sterile gloves, sterile drape, hand hygiene and skin antiseptic. A timeout was performed prior to the initiation of the procedure. The back was prepped with ChloraPrep in a sterile fashion, and a sterile drape was applied covering the operative field. A sterile gown and sterile gloves were used for the procedure. Under sonographic guidance, two 16 gauge core biopsies of the left lower pole renal cortex were obtain. Final imaging was performed. Patient tolerated the procedure well without complication. Vital sign monitoring by nursing staff during the procedure will continue as patient is in the special procedures unit for post procedure observation. FINDINGS: The images document guide needle placement within the  left lower pole renal cortex. Post biopsy images demonstrate minimal perinephric hemorrhage. IMPRESSION: Successful ultrasound-guided random renal cortex core biopsy. Electronically Signed   By: Marybelle Killings M.D.   On: 07/02/2016 14:12   Dg Chest Port 1 View  06/22/2016  CLINICAL DATA:  Acute respiratory failure EXAM: PORTABLE CHEST 1 VIEW COMPARISON:  06/20/2016 FINDINGS: 0652 hours. Patient rotated to the right. Lungs are hyperexpanded. Interstitial markings are diffusely coarsened with chronic features. The diffuse bilateral airspace disease seen previously is decreased in the interval. The cardio pericardial silhouette is enlarged. Feeding tube is coiled in the stomach with  the tip positioned in the region of the antrum. Telemetry leads overlie the chest. IMPRESSION: Interval decrease in bilateral diffuse airspace disease. Electronically Signed   By: Misty Stanley M.D.   On: 06/22/2016 08:17   Dg Chest Port 1 View  06/20/2016  CLINICAL DATA:  Smoker, Healthcare associated pneumonia EXAM: PORTABLE CHEST 1 VIEW COMPARISON:  06/17/2016 FINDINGS: Cardiomediastinal silhouette is stable. Again noted diffuse bilateral infiltrates right greater than left with slight improvement in aeration. NG feeding tube coiled within stomach with tip not included in the film. Degenerative changes left shoulder. IMPRESSION: Again noted diffuse bilateral infiltrates right greater than left with slight improvement in aeration from prior exam. Electronically Signed   By: Lahoma Crocker M.D.   On: 06/20/2016 09:09   Dg Chest Port 1 View  06/17/2016  CLINICAL DATA:  Patient with acute respiratory failure. Severe shortness of breath. EXAM: PORTABLE CHEST 1 VIEW COMPARISON:  Chest radiograph 06/15/2016. FINDINGS: Monitoring leads overlie the patient. Cardiomediastinal contours largely obscured. Grossly unchanged diffuse bilateral airspace opacities. Probable small bilateral pleural Fahrenheit scratch of small bilateral pleural  effusions. No pneumothorax. IMPRESSION: Grossly unchanged diffuse bilateral airspace opacities. Small bilateral pleural effusions. Electronically Signed   By: Lovey Newcomer M.D.   On: 06/17/2016 08:03   Dg Chest Port 1 View  06/13/2016  CLINICAL DATA:  Altered mental status with abdominal pain for 2 days. History of diabetes and anemia. EXAM: PORTABLE CHEST 1 VIEW COMPARISON:  06/05/2016 and 02/07/2016 radiographs. FINDINGS: 1729 hours. There are lower lung volumes. Allowing for this, the heart size and mediastinal contours are stable. There are new left-greater-than-right perihilar and lower lobe airspace opacities with a probable small left pleural effusion. No evidence of pneumothorax. The bones appear unchanged. IMPRESSION: New lower lung volumes with left-greater-than-right perihilar and lower lobe airspace opacities. These findings are nonspecific and may reflect asymmetric edema, although are worrisome for possible aspiration. Electronically Signed   By: Richardean Sale M.D.   On: 06/13/2016 17:39   Dg Knee Right Port  06/06/2016  CLINICAL DATA:  Femoral neck fracture, fall this morning EXAM: PORTABLE RIGHT KNEE - 1-2 VIEW COMPARISON:  None FINDINGS: Three views of the right knee submitted. No acute fracture or subluxation. There is diffuse osteopenia. Mild narrowing of medial joint compartment. Small joint effusion. Atherosclerotic calcifications of femoral and popliteal artery. IMPRESSION: No acute fracture or subluxation. Mild degenerative changes. Small joint effusion. Diffuse osteopenia. Electronically Signed   By: Lahoma Crocker M.D.   On: 06/06/2016 12:10   Dg Abd Portable 1v  06/18/2016  CLINICAL DATA:  Feeding tube placement EXAM: PORTABLE ABDOMEN - 1 VIEW COMPARISON:  06/13/2016 FINDINGS: Feeding tube coils in the proximal stomach with the tip in the distal stomach. Nonobstructive bowel gas pattern. IMPRESSION: Feeding tube tip in the distal stomach. Electronically Signed   By: Rolm Baptise M.D.    On: 06/18/2016 13:00   Dg Abd Portable 1v  06/13/2016  CLINICAL DATA:  Altered mental status with abdominal pain for 2 days. History of diabetes and anemia. EXAM: PORTABLE ABDOMEN - 1 VIEW COMPARISON:  Radiographs 06/06/2016.  CT 03/11/2016. FINDINGS: 1728 hours. Prominent stool is again noted within the rectum. The bowel gas pattern is nonobstructive. There is no evidence of bowel wall thickening or free air. Interval right hip hemiarthroplasty and scattered vascular calcifications are noted. IMPRESSION: No acute abdominal findings. Prominent stool in the rectum, similar to prior study. Electronically Signed   By: Richardean Sale M.D.   On: 06/13/2016 17:41  Dg Hip Port Unilat With Pelvis 1v Right  06/07/2016  CLINICAL DATA:  Right femoral neck fracture.  Postop imaging. EXAM: DG HIP (WITH OR WITHOUT PELVIS) 1V PORT RIGHT COMPARISON:  Two days ago FINDINGS: New bipolar right hip hemiarthroplasty without periprosthetic fracture. The prosthesis is located. Negative visualized bony pelvis and left hip. Osteopenia and atherosclerosis. IMPRESSION: No adverse finding after right hip hemiarthroplasty. Electronically Signed   By: Monte Fantasia M.D.   On: 06/07/2016 22:24   Dg Hip Unilat With Pelvis 2-3 Views Right  07/04/2016  CLINICAL DATA:  Right hip pain seems to be getting worse, no known injury - had right hip surgery (replacement) about 1 month ago now EXAM: DG HIP (WITH OR WITHOUT PELVIS) 2-3V RIGHT COMPARISON:  06/07/2016 FINDINGS: Right hemiarthroplasty appears well-seated, and well-positioned without evidence of loosening. There is no acute fracture.  There is no bone lesion. SI joints, symphysis pubis and left hip joint are normally spaced and aligned. There are dense pelvic and femoral artery arterial vascular calcifications. There is diffuse soft tissue subcutaneous edema. IMPRESSION: 1. No acute fracture or dislocation. 2. Right hemiarthroplasty appears well-seated a well-positioned them without  radiographic evidence of loosening. Electronically Signed   By: Lajean Manes M.D.   On: 07/04/2016 13:12   Dg Hip Unilat  With Pelvis 2-3 Views Right  06/05/2016  CLINICAL DATA:  Status post fall today with right leg deformity. EXAM: DG HIP (WITH OR WITHOUT PELVIS) 2-3V RIGHT COMPARISON:  None. FINDINGS: There is displaced fracture of the right femoral neck. No other fracture is identified. There is no dislocation. IMPRESSION: Displaced fracture of the right femoral. Electronically Signed   By: Abelardo Diesel M.D.   On: 06/05/2016 18:35     Subjective: Denies any new complaints, refusing to go to [facility].   Discharge Exam: Filed Vitals:   07/03/16 2022 07/04/16 0553  BP: 91/44 136/63  Pulse: 81 86  Temp: 98.9 F (37.2 C) 98.6 F (37 C)  Resp: 18 18   Filed Vitals:   07/03/16 0300 07/03/16 1621 07/03/16 2022 07/04/16 0553  BP: 138/59 158/67 91/44 136/63  Pulse: 62 84 81 86  Temp: 98.4 F (36.9 C)  98.9 F (37.2 C) 98.6 F (37 C)  TempSrc: Oral  Oral Oral  Resp: 18 20 18 18   Height:      Weight: 55.384 kg (122 lb 1.6 oz)   54.205 kg (119 lb 8 oz)  SpO2: 100% 100% 96% 100%    General: Pt is alert, awake, not in acute distress Cardiovascular: RRR, S1/S2 +,  Respiratory: CTA bilaterally, no wheezing, no rhonchi, Abdominal: Soft, NT, ND, bowel sounds + Extremities: no edema, no cyanosis    The results of significant diagnostics from this hospitalization (including imaging, microbiology, ancillary and laboratory) are listed below for reference.     Microbiology: No results found for this or any previous visit (from the past 240 hour(s)).   Labs: BNP (last 3 results)  Recent Labs  06/20/16 0035  BNP [id number]*   Basic Metabolic Panel:  Recent Labs Lab 06/30/16 0232 07/01/16 0218 07/02/16 0211 07/03/16 0300 07/04/16 0219  NA 139 141 145 143 143  K 5.4* 4.9 5.3* 5.3* 4.5  CL 104 106 103 109 106  CO2 28 29 26 29 31   GLUCOSE 175* 80 164* 102* 135*  BUN 94*  83* 76* 70* 66*  CREATININE 3.79* 3.59* 3.46* 3.54* 3.32*  CALCIUM 8.8* 8.7* 9.8 8.8* 8.6*  PHOS  --  3.8  --  3.7 3.6   Liver Function Tests:  Recent Labs Lab 07/03/16 0300 07/04/16 0219  ALBUMIN 1.8* 1.7*   No results for input(s): LIPASE, AMYLASE in the last 168 hours. No results for input(s): AMMONIA in the last 168 hours. CBC:  Recent Labs Lab 06/30/16 0232 07/01/16 0218 07/02/16 0211 07/03/16 0300 07/04/16 0219  WBC 5.9 5.3 5.5 4.7 3.6*  HGB 8.3* 8.6* 8.5* 7.9* 7.3*  HCT 26.4* 27.0* 27.6* 25.5* 23.4*  MCV 94.0 96.4 96.8 98.1 97.5  PLT 218 210 220 203 198   Cardiac Enzymes: No results for input(s): CKTOTAL, CKMB, CKMBINDEX, TROPONINI in the last 168 hours. BNP: Invalid input(s): POCBNP CBG:  Recent Labs Lab 07/03/16 1619 07/03/16 2125 07/03/16 2201 07/04/16 0623 07/04/16 1124  GLUCAP 184* 54* 82 78 188*   D-Dimer No results for input(s): DDIMER in the last 72 hours. Hgb A1c No results for input(s): HGBA1C in the last 72 hours. Lipid Profile No results for input(s): CHOL, HDL, LDLCALC, TRIG, CHOLHDL, LDLDIRECT in the last 72 hours. Thyroid function studies No results for input(s): TSH, T4TOTAL, T3FREE, THYROIDAB in the last 72 hours.  Invalid input(s): FREET3 Anemia work up No results for input(s): VITAMINB12, FOLATE, FERRITIN, TIBC, IRON, RETICCTPCT in the last 72 hours. Urinalysis    Component Value Date/Time   COLORURINE AMBER* 06/13/2016 1615   COLORURINE Yellow 05/04/2014 0914   APPEARANCEUR TURBID* 06/13/2016 1615   APPEARANCEUR Clear 05/04/2014 0914   LABSPEC 1.021 06/13/2016 1615   LABSPEC 1.010 05/04/2014 0914   LABSPEC 1.020 02/14/2011 1654   PHURINE 5.0 06/13/2016 1615   PHURINE 5.0 05/04/2014 0914   PHURINE 6.0 02/14/2011 1654   GLUCOSEU 250* 06/13/2016 1615   GLUCOSEU >=500 05/04/2014 0914   HGBUR TRACE* 06/13/2016 1615   HGBUR 1+ 05/04/2014 0914   HGBUR Negative 02/14/2011 1654   HGBUR negative 05/17/2009 1334   BILIRUBINUR  SMALL* 06/13/2016 1615   BILIRUBINUR Negative 05/04/2014 0914   BILIRUBINUR Negative 02/14/2011 1654   KETONESUR NEGATIVE 06/13/2016 1615   KETONESUR Negative 05/04/2014 0914   KETONESUR Negative 02/14/2011 1654   PROTEINUR NEGATIVE 06/13/2016 1615   PROTEINUR Negative 05/04/2014 0914   PROTEINUR Negative 02/14/2011 1654   UROBILINOGEN 0.2 09/17/2009 0233   NITRITE NEGATIVE 06/13/2016 1615   NITRITE Negative 05/04/2014 0914   NITRITE Negative 02/14/2011 1654   LEUKOCYTESUR LARGE* 06/13/2016 1615   LEUKOCYTESUR 2+ 05/04/2014 0914   LEUKOCYTESUR Trace 02/14/2011 1654   Sepsis Labs Invalid input(s): PROCALCITONIN,  WBC,  LACTICIDVEN Microbiology No results found for this or any previous visit (from the past 240 hour(s)).   Time coordinating discharge: Over 30 minutes  SIGNED:   Hosie Poisson, MD  [facility] 07/04/2016, 1:25 PM Pager [phone]  If 7PM-7AM, please contact night-coverage [email or web address] Password [email or web address]

## 2016-07-04 NOTE — Clinical Social Work Placement (Signed)
   CLINICAL SOCIAL WORK PLACEMENT  NOTE  Date:  07/04/2016  Patient Details  Name: [name] MRN: [id number] Date of Birth: 1951-03-28  Clinical Social Work is seeking post-discharge placement for this patient at the [place] level of care (*CSW will initial, date and re-position this form in  chart as items are completed):  Yes   Patient/family provided with [facility] Work Department's list of facilities offering this level of care within the geographic area requested by the patient (or if unable, by the patient's family).  Yes   Patient/family informed of their freedom to choose among providers that offer the needed level of care, that participate in Medicare, Medicaid or managed care program needed by the patient, have an available bed and are willing to accept the patient.  Yes   Patient/family informed of [facility]'s ownership interest in [facility] and [facility], as well as of the fact that they are under no obligation to receive care at these facilities.  PASRR submitted to EDS on       PASRR number received on       Existing PASRR number confirmed on 06/04/16     FL2 transmitted to all facilities in geographic area requested by pt/family on       FL2 transmitted to all facilities within larger geographic area on       Patient informed that his/her managed care company has contracts with or will negotiate with certain facilities, including the following:        Yes   Patient/family informed of bed offers received.  Patient chooses bed at [facility]     Physician recommends and patient chooses bed at      Patient to be transferred to   on  .  Patient to be transferred to facility by ambulance     Patient family notified on 07/04/16 of transfer.  Name of family member notified:  Harris,Carolyn     PHYSICIAN Please prepare priority discharge summary, including medications, Please sign FL2, Please prepare  prescriptions     Additional Comment:  Per MD patient is ready to discharge to [facility]. RN, patient, patient's family, and facility notified of discharge. RN given phone number for report and transport packet is on patient's chart. Ambulance transport requested. CSW signing off.   _______________________________________________ Samule Dry, LCSW 07/04/2016, 2:46 PM

## 2016-07-04 NOTE — Progress Notes (Signed)
RT came to place CPAP on patient. Patient does not want to wear it tonight she had a bad panic attack when tried last night. Patient in no distress at this time.

## 2016-07-04 NOTE — Progress Notes (Signed)
Inpatient Diabetes Program Recommendations  AACE/ADA: New Consensus Statement on Inpatient Glycemic Control (2015)  Target Ranges:  Prepandial:   less than 140 mg/dL      Peak postprandial:   less than 180 mg/dL (1-2 hours)      Critically ill patients:  140 - 180 mg/dL   Lab Results  Component Value Date   GLUCAP 78 07/04/2016   HGBA1C 6.8* 06/13/2016    Review of Glycemic ControlResults for SINEA, CARCHI (MRN [id number]) as of 07/04/2016 10:12  Ref. Range 07/03/2016 11:20 07/03/2016 16:19 07/03/2016 21:25 07/03/2016 22:01 07/04/2016 06:23  Glucose-Capillary Latest Ref Range: 65-99 mg/dL 182 (H) 184 (H) 54 (L) 82 78    Inpatient Diabetes Program Recommendations:  Consider d/c of Lantus due to lower blood sugars.  Also consider reducing Novolog correction to sensitive tid with meals and decrease Novolog meal coverage to 3 units tid with meals.  Thanks, Adah Perl, RN, BC-ADM Inpatient Diabetes Coordinator Pager [phone] (8a-5p)

## 2016-07-04 NOTE — Progress Notes (Signed)
Patient ID: [name], female   DOB: Jul 03, 1951, 65 y.o.   MRN: [id number] [facility] Progress Note   Assessment/ Plan:   1. AKI on CKD stage IV (baseline creatinine 1.9-2.2): Urine output unfortunately appears to be incompletely collected/charted but fortunately her renal function appears slightly better with improvement of BUN/creatinine. Concern raised with possible plasma cell dyscrasia and associated renal injury-kidney biopsy done earlier this week and preliminary result pending. Electrolytes currently appear to be within acceptable limits. Remains hypervolemic-continue furosemide 40 mg daily and limit sodium intake. 2. Septic shock likely from healthcare associated pneumonia: Negative cultures so far, status post completion of vancomycin/Zosyn/levofloxacin and currently remains afebrile and with acceptable blood pressures. 3. Anemia: Denies overt loss, iron stores replete. Start ESA. 4. Metabolic bone disease: Phosphorus appears to be well controlled on calcium acetate 1334 mg 3 times a day before meals 5. Coronary artery disease status post STEMI 02/2016  Subjective:   Reports to be feeling fair-denies any chest pain or shortness of breath. Reports poor appetite without any nausea or dysgeusia. States that she is forcing herself to drink water.    Objective:   BP 136/63 mmHg  Pulse 86  Temp(Src) 98.6 F (37 C) (Oral)  Resp 18  Ht 5\' 3"  (1.6 m)  Wt 54.205 kg (119 lb 8 oz)  BMI 21.17 kg/m2  SpO2 100%  Intake/Output Summary (Last 24 hours) at 07/04/16 0756 Last data filed at 07/03/16 2131  Gross per 24 hour  Intake    480 ml  Output    651 ml  Net   -171 ml   Weight change: -1.179 kg (-2 lb 9.6 oz)  Physical Exam: [id number] resting in bed CVS: Pulse regular rhythm/normal rate, S1 and S2 normal Resp: Clear to auscultation, no rales Abd: Soft, flat, nontender Ext: 3+ lower extremity edema bilaterally  Imaging: US Biopsy  07/02/2016   INDICATION: Renal failure EXAM: ULTRASOUND-GUIDED BIOPSY RENAL CORTEX.  CORE. MEDICATIONS: None. ANESTHESIA/SEDATION: Fentanyl 50 mcg IV; Versed 1 mg IV Moderate Sedation Time:  10 The patient was continuously monitored during the procedure by the interventional radiology nurse under my direct supervision. FLUOROSCOPY TIME:  Fluoroscopy Time:  minutes  seconds ( mGy). COMPLICATIONS: None immediate. PROCEDURE: Informed written consent was obtained from the patient after a thorough discussion of the procedural risks, benefits and alternatives. All questions were addressed. Maximal Sterile Barrier Technique was utilized including caps, mask, sterile gowns, sterile gloves, sterile drape, hand hygiene and skin antiseptic. A timeout was performed prior to the initiation of the procedure. The back was prepped with ChloraPrep in a sterile fashion, and a sterile drape was applied covering the operative field. A sterile gown and sterile gloves were used for the procedure. Under sonographic guidance, two 16 gauge core biopsies of the left lower pole renal cortex were obtain. Final imaging was performed. Patient tolerated the procedure well without complication. Vital sign monitoring by nursing staff during the procedure will continue as patient is in the special procedures unit for post procedure observation. FINDINGS: The images document guide needle placement within the left lower pole renal cortex. Post biopsy images demonstrate minimal perinephric hemorrhage. IMPRESSION: Successful ultrasound-guided random renal cortex core biopsy. Electronically Signed   By: Marybelle Killings M.D.   On: 07/02/2016 14:12    Labs: BMET  Recent Labs Lab 06/28/16 0234 06/29/16 0239 06/30/16 0232 07/01/16 0218 07/02/16 0211 07/03/16 0300 07/04/16 0219  NA 140 139 139 141 145 143 143  K 5.3* 5.4* 5.4* 4.9  5.3* 5.3* 4.5  CL 105 105 104 106 103 109 106  CO2 28 27 28 29 26 29 31   GLUCOSE 135* 197* 175* 80 164* 102* 135*  BUN 96* 93*  94* 83* 76* 70* 66*  CREATININE 3.74* 3.78* 3.79* 3.59* 3.46* 3.54* 3.32*  CALCIUM 8.3* 8.6* 8.8* 8.7* 9.8 8.8* 8.6*  PHOS  --   --   --  3.8  --  3.7 3.6   CBC  Recent Labs Lab 07/01/16 0218 07/02/16 0211 07/03/16 0300 07/04/16 0219  WBC 5.3 5.5 4.7 3.6*  HGB 8.6* 8.5* 7.9* 7.3*  HCT 27.0* 27.6* 25.5* 23.4*  MCV 96.4 96.8 98.1 97.5  PLT 210 220 203 198    Medications:    . sodium chloride   Intravenous Once  . antiseptic oral rinse  7 mL Mouth Rinse q12n4p  . calcium acetate  1,334 mg Oral TID WC  . carbamide peroxide  5 drop Right Ear BID  . carvedilol  3.125 mg Oral BID WC  . chlorhexidine  15 mL Mouth Rinse BID  . cholecalciferol  5,000 Units Oral Daily  . clopidogrel  75 mg Oral QHS  . feeding supplement (GLUCERNA SHAKE)  237 mL Oral TID BM  . fenofibrate  160 mg Oral QHS  . FLUoxetine  20 mg Oral Daily  . furosemide  40 mg Oral BID  . gabapentin  200 mg Oral TID  . insulin aspart  0-20 Units Subcutaneous TID WC  . insulin aspart  0-5 Units Subcutaneous QHS  . insulin aspart  6 Units Subcutaneous TID WC  . insulin glargine  8 Units Subcutaneous Daily  . isosorbide mononitrate  60 mg Oral Daily  . loratadine  10 mg Oral Daily  . pantoprazole  40 mg Oral BID  . simvastatin  20 mg Oral QHS  . topiramate  50 mg Oral BID   Elmarie Shiley, MD 07/04/2016, 7:56 AM

## 2016-07-04 NOTE — Progress Notes (Signed)
Received call from [facility]. Pharmacy received duplicate Lantus orders - one for 5 units and one for 8 units. Per patient AVS pt is to take 5 units (0.5 mL) daily. Informed pharmacist of dosing. All questions answered.  Fritz Pickerel, RN

## 2016-07-06 LAB — BASIC METABOLIC PANEL
BUN: 56 mg/dL — AB (ref 4–21)
BUN: 56 mg/dL — AB (ref 4–21)
CREATININE: 3.5 mg/dL — AB (ref <=1.1)
Creatinine: 3.5 mg/dL — AB (ref 0.5–1.1)
Glucose: 104 mg/dL
POTASSIUM: 4.1 mmol/L (ref 3.4–5.3)
Potassium: 4.1 mmol/L (ref 3.4–5.3)
SODIUM: 143 mmol/L (ref 137–147)
SODIUM: 143 mmol/L (ref 137–147)

## 2016-07-09 ENCOUNTER — Non-Acute Institutional Stay (SKILLED_NURSING_FACILITY): Payer: Commercial Managed Care - HMO | Admitting: Internal Medicine

## 2016-07-09 ENCOUNTER — Encounter: Payer: Self-pay | Admitting: Internal Medicine

## 2016-07-09 DIAGNOSIS — I10 Essential (primary) hypertension: Secondary | ICD-10-CM

## 2016-07-09 DIAGNOSIS — M792 Neuralgia and neuritis, unspecified: Secondary | ICD-10-CM

## 2016-07-09 DIAGNOSIS — J189 Pneumonia, unspecified organism: Secondary | ICD-10-CM

## 2016-07-09 DIAGNOSIS — S72001S Fracture of unspecified part of neck of right femur, sequela: Secondary | ICD-10-CM

## 2016-07-09 DIAGNOSIS — F411 Generalized anxiety disorder: Secondary | ICD-10-CM | POA: Diagnosis not present

## 2016-07-09 DIAGNOSIS — K219 Gastro-esophageal reflux disease without esophagitis: Secondary | ICD-10-CM | POA: Diagnosis not present

## 2016-07-09 DIAGNOSIS — N184 Chronic kidney disease, stage 4 (severe): Secondary | ICD-10-CM

## 2016-07-09 DIAGNOSIS — E1149 Type 2 diabetes mellitus with other diabetic neurological complication: Secondary | ICD-10-CM

## 2016-07-09 DIAGNOSIS — E46 Unspecified protein-calorie malnutrition: Secondary | ICD-10-CM | POA: Diagnosis not present

## 2016-07-09 DIAGNOSIS — R5381 Other malaise: Secondary | ICD-10-CM | POA: Diagnosis not present

## 2016-07-09 DIAGNOSIS — L8995 Pressure ulcer of unspecified site, unstageable: Secondary | ICD-10-CM

## 2016-07-09 DIAGNOSIS — R1310 Dysphagia, unspecified: Secondary | ICD-10-CM | POA: Diagnosis not present

## 2016-07-09 DIAGNOSIS — Z8673 Personal history of transient ischemic attack (TIA), and cerebral infarction without residual deficits: Secondary | ICD-10-CM

## 2016-07-09 DIAGNOSIS — I25119 Atherosclerotic heart disease of native coronary artery with unspecified angina pectoris: Secondary | ICD-10-CM | POA: Diagnosis not present

## 2016-07-09 DIAGNOSIS — D62 Acute posthemorrhagic anemia: Secondary | ICD-10-CM | POA: Diagnosis not present

## 2016-07-09 DIAGNOSIS — R600 Localized edema: Secondary | ICD-10-CM

## 2016-07-09 NOTE — Progress Notes (Signed)
LOCATION: [facility]  PCP: [name], MD   Code Status: Full Code  Goals of care: Advanced Directive information Advanced Directives 06/13/2016  Does patient have an advance directive? No  Would patient like information on creating an advanced directive? No - patient declined information       Extended Emergency Contact Information Primary Emergency Contact: [place] of [place] Phone: [phone] Mobile Phone: [phone] Relation: Son Secondary Emergency Contact: [place] of [place] Phone: [phone] Mobile Phone: [phone] Relation: Relative   Allergies  Allergen Reactions  . Codeine Other (See Comments)    Noted as an allergy on MAR  . Hydralazine Hcl Swelling  . Bisphosphonates Rash  . Codeine Phosphate Nausea And Vomiting and Rash  . Penicillins Itching, Rash and Other (See Comments)    Has patient had a PCN reaction causing immediate rash, facial/tongue/throat swelling, SOB or lightheadedness with hypotension: No Has patient had a PCN reaction causing severe rash involving mucus membranes or skin necrosis: No Has patient had a PCN reaction that required hospitalization No Has patient had a PCN reaction occurring within the last 10 years: No If all of the above answers are "NO", then may proceed with Cephalosporin use.  Pt tolerant to cephalosporins   . Sulfa Antibiotics Itching and Rash    Chief Complaint  Patient presents with  . Readmit To SNF    Readmission     HPI:  Patient is a 66 y.o. female seen today for short term rehabilitation post hospital admission from 06/13/16-07/04/16 with septic shock, acute respiratory failure from HCAP and pulmonary edema and acute on chronic renal failure. She required iv fluids, iv antibiotics and diuretics. She was in the hospital before this with right femoral neck fracture s/p arthroplasty on 06/07/16 with UTI and ABLA. She was at [facility] for STR  but had to be admitted back in the hospital before being seen by a provider for above reason. She is seen in her room today.   Review of Systems:  Constitutional: Negative for fever, chills, diaphoresis. Feels weak and tired.  HENT: Negative for headache, congestion, sore throat. Positive for nasal discharge and difficulty swallowing big pills.   Eyes: Negative for blurred vision, double vision and discharge.  Respiratory: Negative for shortness of breath and wheezing. Positive for occasional dry cough.   Cardiovascular: Negative for chest pain, palpitations, leg swelling.  Gastrointestinal: Negative for nausea, vomiting, abdominal pain, constipation. Positive for heartburn. Last bowel movement was today. Genitourinary: Negative for dysuria and flank pain.  Musculoskeletal: Negative for back pain, fall in the facility.  Skin: Negative for itching, rash.  Neurological: Negative for weakness. Positive for occasional dizziness. Psychiatric/Behavioral: Negative for depression    Past Medical History  Diagnosis Date  . DIABETES MELLITUS, II, COMPLICATIONS 99991111  . HYPERLIPIDEMIA 02/27/2007  . 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 ANEMIA, OTHER, UNSPECIFIED 02/27/2007  . DEPRESSIVE DISORDER, NOS 02/27/2007  . HYPERTENSION, BENIGN SYSTEMIC 02/27/2007  . CORONARY, ARTERIOSCLEROSIS 02/27/2007  . TIA 05/17/2009  . GASTROESOPHAGEAL REFLUX, NO ESOPHAGITIS 02/27/2007  . RENAL INSUFFICIENCY, CHRONIC 12/28/2009  . Renal failure, acute on chronic (HCC) 01/25/2014  . Blood transfusion without reported diagnosis   . Anemia   . Cellulitis and abscess of unspecified site 05/02/2010    Qualifier: Diagnosis of  By: Deborra Medina MD, Talia     Past Surgical History  Procedure Laterality Date  . Partial hysterectomy    . Rectocele repair    . Bladder surgery  Tack  . Hip arthroplasty Right 06/07/2016    Procedure: ARTHROPLASTY BIPOLAR HIP (HEMIARTHROPLASTY);  Surgeon: Altamese West Milton, MD;  Location: [facility];  Service:  Orthopedics;  Laterality: Right;   Social History:   reports that she has never smoked. She has never used smokeless tobacco. She reports that she does not drink alcohol or use illicit drugs.  Family History  Problem Relation Age of Onset  . Cancer Mother     unknown  . CAD Mother   . Cancer Sister     unknown cancer ?breast ca    Medications:   Medication List       This list is accurate as of: 07/09/16  6:53 PM.  Always use your most recent med list.               acetaminophen 325 MG tablet  Commonly known as:  TYLENOL  Take 2 tablets (650 mg total) by mouth every 6 (six) hours as needed for mild pain (or Fever >/= 101).     ALPRAZolam 0.5 MG tablet  Commonly known as:  XANAX  Take 1 tablet (0.5 mg total) by mouth 3 (three) times daily as needed for anxiety.     calcium acetate 667 MG capsule  Commonly known as:  PHOSLO  Take 2 capsules (1,334 mg total) by mouth 3 (three) times daily with meals.     carvedilol 3.125 MG tablet  Commonly known as:  COREG  Take 1 tablet (3.125 mg total) by mouth 2 (two) times daily with a meal.     clopidogrel 75 MG tablet  Commonly known as:  PLAVIX  Take 75 mg by mouth at bedtime.     diphenoxylate-atropine 2.5-0.025 MG/5ML liquid  Commonly known as:  LOMOTIL  Take 10 mLs by mouth 4 (four) times daily as needed for diarrhea or loose stools.     feeding supplement (GLUCERNA SHAKE) Liqd  Take 237 mLs by mouth 3 (three) times daily between meals.     Fenofibric Acid 135 MG Cpdr  Take 135 mg by mouth at bedtime.     FLUoxetine 20 MG capsule  Commonly known as:  PROZAC  Take 20 mg by mouth daily.     gabapentin 300 MG capsule  Commonly known as:  NEURONTIN  Take 600 mg by mouth 3 (three) times daily.     insulin glargine 100 UNIT/ML injection  Commonly known as:  LANTUS  Inject 8 Units into the skin at bedtime.     insulin lispro 100 UNIT/ML injection  Commonly known as:  HUMALOG  Inject 3 Units into the skin 3 (three)  times daily before meals.     isosorbide mononitrate 60 MG 24 hr tablet  Commonly known as:  IMDUR  Take 1 tablet (60 mg total) by mouth daily.     lip balm Oint  Apply 1 application topically as needed for lip care.     nitroGLYCERIN 0.4 MG SL tablet  Commonly known as:  NITROSTAT  Place 0.4 mg under the tongue every 5 (five) minutes as needed for chest pain. Reported on 06/13/2016     pantoprazole 40 MG tablet  Commonly known as:  PROTONIX  Take 1 tablet (40 mg total) by mouth 2 (two) times daily.     simvastatin 20 MG tablet  Commonly known as:  ZOCOR  Take 20 mg by mouth at bedtime.     topiramate 100 MG tablet  Commonly known as:  TOPAMAX  Take 100 mg by  mouth 2 (two) times daily.     Vitamin D3 5000 units Tabs  Take 5,000 Units by mouth daily.        Immunizations: Immunization History  Administered Date(s) Administered  . Influenza Split 10/22/2012  . Influenza Whole 10/08/2008  . Influenza,inj,Quad PF,36+ Mos 02/08/2016  . PPD Test 07/04/2016  . Pneumococcal Polysaccharide-23 02/08/2016  . Td 05/31/2006     Physical Exam: Filed Vitals:   07/09/16 1209  BP: 146/68  Pulse: 82  Temp: 97.9 F (36.6 C)  TempSrc: Oral  Resp: 10  Height: 5\' 3"  (1.6 m)  Weight: 119 lb 8 oz (54.205 kg)  SpO2: 97%   Body mass index is 21.17 kg/(m^2).  General- elderly female, thin built, frail, in no acute distress Head- normocephalic, atraumatic Nose- no maxillary or frontal sinus tenderness, no nasal discharge Throat- moist mucus membrane Eyes- PERRLA, EOMI, no pallor, no icterus, no discharge, normal conjunctiva, normal sclera Neck- no cervical lymphadenopathy Cardiovascular- normal s1,s2, no murmur, trace leg edema Respiratory- bilateral clear to auscultation, no wheeze, no rhonchi, no crackles, no use of accessory muscles Abdomen- bowel sounds present, soft, non tender Musculoskeletal- able to move all 4 extremities, generalized weakness with limited right hip  ROM Neurological- alert and oriented to person, place and time Skin- warm and dry, right hip surgical incision healed well, bruise to her arms, unstageable pressure ulcer to coccyx and left heel Psychiatry- normal mood and affect    Labs reviewed: Basic Metabolic Panel:  Recent Labs  06/23/16 1603 06/24/16 06/24/16 0727  07/01/16 0218 07/02/16 0211 07/03/16 0300 07/04/16 0219 07/06/16  NA  --  142  --   < > 141 145 143 143 143  K  --  4.3  --   < > 4.9 5.3* 5.3* 4.5 4.1  CL  --  97*  --   < > 106 103 109 106  --   CO2  --  34*  --   < > 29 26 29 31   --   GLUCOSE  --  118*  --   < > 80 164* 102* 135*  --   BUN  --  121*  --   < > 83* 76* 70* 66* 56*  CREATININE  --  3.60*  --   < > 3.59* 3.46* 3.54* 3.32* 3.5*  CALCIUM  --  8.0*  --   < > 8.7* 9.8 8.8* 8.6*  --   MG 2.1 2.1 2.0  --   --   --   --   --   --   PHOS 5.9* 7.0* 7.0*  < > 3.8  --  3.7 3.6  --   < > = values in this interval not displayed. Liver Function Tests:  Recent Labs  06/07/16 0350 06/13/16 1618 06/14/16 0320  06/27/16 0330 07/03/16 0300 07/04/16 0219  AST 23 44* 40  --   --   --   --   ALT 19 17 14   --   --   --   --   ALKPHOS 30* 78 65  --   --   --   --   BILITOT 1.1 2.3* 0.8  --   --   --   --   PROT 4.6* 4.5* 4.1*  --   --   --   --   ALBUMIN 2.4* 2.0* 1.8*  < > 1.7* 1.8* 1.7*  < > = values in this interval not displayed.  Recent Labs  06/13/16 1745  LIPASE 614*   No results for input(s): AMMONIA in the last 8760 hours. CBC:  Recent Labs  06/09/16 0258  06/13/16 1618 06/14/16 0320  07/02/16 0211 07/03/16 0300 07/04/16 0219  WBC 4.2  < > 9.4 8.6  < > 5.5 4.7 3.6*  NEUTROABS 3.0  --  7.9* 7.6  --   --   --   --   HGB 7.9*  < > 9.7* 10.0*  < > 8.5* 7.9* 7.3*  HCT 23.9*  < > 31.2* 30.0*  < > 27.6* 25.5* 23.4*  MCV 88.2  < > 94.8 88.5  < > 96.8 98.1 97.5  PLT 89*  < > 149* 155  < > 220 203 198  < > = values in this interval not displayed. Cardiac Enzymes:  Recent Labs   06/14/16 2030 06/15/16 0203 06/15/16 0624  TROPONINI 3.80* 2.85* 2.64*   BNP: Invalid input(s): POCBNP CBG:  Recent Labs  07/03/16 2201 07/04/16 0623 07/04/16 1124  GLUCAP 82 78 188*    Radiological Exams: Dg Chest 1 View  06/15/2016  CLINICAL DATA:  Pneumonia EXAM: CHEST 1 VIEW COMPARISON:  06/13/2016 FINDINGS: Cardiac shadow is stable. Significant increase in bilateral infiltrates is seen. A component of this may be related to pulmonary edema as well. No bony abnormality is seen. IMPRESSION: Significant increase in bilateral infiltrates. Electronically Signed   By: Inez Catalina M.D.   On: 06/15/2016 11:25   US Abdomen Complete  06/13/2016  CLINICAL DATA:  Acute onset of generalized abdominal pain. Elevated lipase and bilirubin. Initial encounter. EXAM: ABDOMEN ULTRASOUND COMPLETE COMPARISON:  CT of the abdomen and pelvis performed 03/11/2016, and renal ultrasound performed 02/08/2016 FINDINGS: Gallbladder: No gallstones or wall thickening visualized. Trace sludge is noted within the gallbladder. No sonographic Murphy sign noted by sonographer. Common bile duct: Diameter: 0.3 cm, within normal limits in caliber. Liver: No focal lesion identified. Within normal limits in parenchymal echogenicity. Trace fluid is noted adjacent to the gallbladder fossa. IVC: No abnormality visualized. Pancreas: Visualized portion unremarkable. Spleen: Size and appearance within normal limits. Right Kidney: Length: 11.1 cm. Increased renal parenchymal echogenicity noted. Trace right-sided perinephric fluid is seen. No mass or hydronephrosis visualized. Left Kidney: Length: 10.0 cm. Increased renal parenchymal echogenicity noted. No mass or hydronephrosis visualized. Abdominal aorta: No aneurysm visualized. Scattered calcification is seen along the abdominal aorta. Other findings: Small bilateral pleural effusions are seen. IMPRESSION: 1. No acute abnormality seen within the abdomen. 2. Trace sludge within the  gallbladder. Gallbladder otherwise unremarkable. 3. Increased renal parenchymal echogenicity likely reflects medical renal disease. 4. Trace fluid noted adjacent to the gallbladder fossa. 5. Trace right-sided perinephric fluid seen. 6. Small bilateral pleural effusions noted. 7. Scattered calcification along the abdominal aorta. Electronically Signed   By: Garald Balding M.D.   On: 06/13/2016 23:36   US Renal  06/25/2016  CLINICAL DATA:  65 year old diabetic hypertensive female with stage 3 kidney disease. Subsequent encounter. EXAM: RENAL / URINARY TRACT ULTRASOUND COMPLETE COMPARISON:  06/13/2016 ultrasound.  03/11/2016 CT. FINDINGS: Right Kidney: Length: 10.6 cm. Echogenicity within normal limits. No mass or hydronephrosis visualized. Left Kidney: Length: 10.7 cm. Echogenicity within normal limits. No mass or hydronephrosis visualized. Bladder: Appears normal for degree of bladder distention. IMPRESSION: No hydronephrosis. Limited evaluation of bladder secondary to decompression. Right-sided pleural effusion suspected. Electronically Signed   By: Genia Del M.D.   On: 06/25/2016 17:13   US Biopsy  07/02/2016  INDICATION: Renal failure EXAM: ULTRASOUND-GUIDED BIOPSY RENAL CORTEX.  CORE.  MEDICATIONS: None. ANESTHESIA/SEDATION: Fentanyl 50 mcg IV; Versed 1 mg IV Moderate Sedation Time:  10 The patient was continuously monitored during the procedure by the interventional radiology nurse under my direct supervision. FLUOROSCOPY TIME:  Fluoroscopy Time:  minutes  seconds ( mGy). COMPLICATIONS: None immediate. PROCEDURE: Informed written consent was obtained from the patient after a thorough discussion of the procedural risks, benefits and alternatives. All questions were addressed. Maximal Sterile Barrier Technique was utilized including caps, mask, sterile gowns, sterile gloves, sterile drape, hand hygiene and skin antiseptic. A timeout was performed prior to the initiation of the procedure. The back was  prepped with ChloraPrep in a sterile fashion, and a sterile drape was applied covering the operative field. A sterile gown and sterile gloves were used for the procedure. Under sonographic guidance, two 16 gauge core biopsies of the left lower pole renal cortex were obtain. Final imaging was performed. Patient tolerated the procedure well without complication. Vital sign monitoring by nursing staff during the procedure will continue as patient is in the special procedures unit for post procedure observation. FINDINGS: The images document guide needle placement within the left lower pole renal cortex. Post biopsy images demonstrate minimal perinephric hemorrhage. IMPRESSION: Successful ultrasound-guided random renal cortex core biopsy. Electronically Signed   By: Marybelle Killings M.D.   On: 07/02/2016 14:12   Dg Chest Port 1 View  06/22/2016  CLINICAL DATA:  Acute respiratory failure EXAM: PORTABLE CHEST 1 VIEW COMPARISON:  06/20/2016 FINDINGS: 0652 hours. Patient rotated to the right. Lungs are hyperexpanded. Interstitial markings are diffusely coarsened with chronic features. The diffuse bilateral airspace disease seen previously is decreased in the interval. The cardio pericardial silhouette is enlarged. Feeding tube is coiled in the stomach with the tip positioned in the region of the antrum. Telemetry leads overlie the chest. IMPRESSION: Interval decrease in bilateral diffuse airspace disease. Electronically Signed   By: Misty Stanley M.D.   On: 06/22/2016 08:17   Dg Chest Port 1 View  06/20/2016  CLINICAL DATA:  Smoker, Healthcare associated pneumonia EXAM: PORTABLE CHEST 1 VIEW COMPARISON:  06/17/2016 FINDINGS: Cardiomediastinal silhouette is stable. Again noted diffuse bilateral infiltrates right greater than left with slight improvement in aeration. NG feeding tube coiled within stomach with tip not included in the film. Degenerative changes left shoulder. IMPRESSION: Again noted diffuse bilateral  infiltrates right greater than left with slight improvement in aeration from prior exam. Electronically Signed   By: Lahoma Crocker M.D.   On: 06/20/2016 09:09   Dg Chest Port 1 View  06/17/2016  CLINICAL DATA:  Patient with acute respiratory failure. Severe shortness of breath. EXAM: PORTABLE CHEST 1 VIEW COMPARISON:  Chest radiograph 06/15/2016. FINDINGS: Monitoring leads overlie the patient. Cardiomediastinal contours largely obscured. Grossly unchanged diffuse bilateral airspace opacities. Probable small bilateral pleural Fahrenheit scratch of small bilateral pleural effusions. No pneumothorax. IMPRESSION: Grossly unchanged diffuse bilateral airspace opacities. Small bilateral pleural effusions. Electronically Signed   By: Lovey Newcomer M.D.   On: 06/17/2016 08:03   Dg Chest Port 1 View  06/13/2016  CLINICAL DATA:  Altered mental status with abdominal pain for 2 days. History of diabetes and anemia. EXAM: PORTABLE CHEST 1 VIEW COMPARISON:  06/05/2016 and 02/07/2016 radiographs. FINDINGS: 1729 hours. There are lower lung volumes. Allowing for this, the heart size and mediastinal contours are stable. There are new left-greater-than-right perihilar and lower lobe airspace opacities with a probable small left pleural effusion. No evidence of pneumothorax. The bones appear unchanged. IMPRESSION: New lower lung volumes  with left-greater-than-right perihilar and lower lobe airspace opacities. These findings are nonspecific and may reflect asymmetric edema, although are worrisome for possible aspiration. Electronically Signed   By: Richardean Sale M.D.   On: 06/13/2016 17:39   Dg Abd Portable 1v  06/18/2016  CLINICAL DATA:  Feeding tube placement EXAM: PORTABLE ABDOMEN - 1 VIEW COMPARISON:  06/13/2016 FINDINGS: Feeding tube coils in the proximal stomach with the tip in the distal stomach. Nonobstructive bowel gas pattern. IMPRESSION: Feeding tube tip in the distal stomach. Electronically Signed   By: Rolm Baptise  M.D.   On: 06/18/2016 13:00   Dg Abd Portable 1v  06/13/2016  CLINICAL DATA:  Altered mental status with abdominal pain for 2 days. History of diabetes and anemia. EXAM: PORTABLE ABDOMEN - 1 VIEW COMPARISON:  Radiographs 06/06/2016.  CT 03/11/2016. FINDINGS: 1728 hours. Prominent stool is again noted within the rectum. The bowel gas pattern is nonobstructive. There is no evidence of bowel wall thickening or free air. Interval right hip hemiarthroplasty and scattered vascular calcifications are noted. IMPRESSION: No acute abdominal findings. Prominent stool in the rectum, similar to prior study. Electronically Signed   By: Richardean Sale M.D.   On: 06/13/2016 17:41   Dg Hip Unilat With Pelvis 2-3 Views Right  07/04/2016  CLINICAL DATA:  Right hip pain seems to be getting worse, no known injury - had right hip surgery (replacement) about 1 month ago now EXAM: DG HIP (WITH OR WITHOUT PELVIS) 2-3V RIGHT COMPARISON:  06/07/2016 FINDINGS: Right hemiarthroplasty appears well-seated, and well-positioned without evidence of loosening. There is no acute fracture.  There is no bone lesion. SI joints, symphysis pubis and left hip joint are normally spaced and aligned. There are dense pelvic and femoral artery arterial vascular calcifications. There is diffuse soft tissue subcutaneous edema. IMPRESSION: 1. No acute fracture or dislocation. 2. Right hemiarthroplasty appears well-seated a well-positioned them without radiographic evidence of loosening. Electronically Signed   By: Lajean Manes M.D.   On: 07/04/2016 13:12    Assessment/Plan  Physical deconditioning Will have her work with physical therapy and occupational therapy team to help with gait training and muscle strengthening exercises.fall precautions. Skin care. Encourage to be out of bed.   Right femoral neck fracture S/p arthroplasty. Will have patient work with PT/OT as tolerated to regain strength and restore function.  Fall precautions are in place.  Has orthopedic follow up. Continue tylenol 650 mg q6h prn pain  Blood loss anemia Post op, monitor cbc  HCAP Completed her antibiotics. Wean off o2 to room air as tolerated. Encouraged to use incentive spirometer  Protein calorie malnutrition Monitor po intake. Monitor weight. Get RD consult. Continue feeding supplement  Dysphagia Get SLP consult  ckd stage 4 Monitor bmp, off ACEI at present. Pending renal biopsy result to follow with nephrology.  GAD Stable, continue xanax 0.5 mg tid prn with daily prozac  HTN Stable. Continue coreg 3.125 mg bid and imdur 60 mg daily. Monitor BP  CAD Remains chest pain free. Continue coreg, plavix, imdur and prn NTG. Continue statin  Pressure ulcer Continue wound care and pressure ulcer prophylaxis  DM type 2 with neurological manifestation Lab Results  Component Value Date   HGBA1C 6.8* 06/13/2016   Monitor cbg. Continue lantus 5 u daily with statin.Continue neurontin 600 mg tid  gerd Continue protonix 40 mg bid  History of TIA Continue statin and plavix with bp meds  Leg edema Continue lasix for now and monitor bmp   Goals  of care: short term rehabilitation   Labs/tests ordered: cbc, cmp 07/10/16  Family/ staff Communication: reviewed care plan with patient and nursing supervisor    Blanchie Serve, MD Internal Medicine [place], [place] [address] Cell Phone (Monday-Friday 8 am - 5 pm): [phone] On Call: [phone] and follow prompts after 5 pm and on weekends Office Phone: [phone] Office Fax: [phone]

## 2016-07-11 LAB — BASIC METABOLIC PANEL
BUN: 52 mg/dL — AB (ref 4–21)
Creatinine: 2.6 mg/dL — AB (ref <=1.1)
GLUCOSE: 36 mg/dL
POTASSIUM: 4.1 mmol/L (ref 3.4–5.3)
Sodium: 143 mmol/L (ref 137–147)

## 2016-07-11 LAB — HEPATIC FUNCTION PANEL
ALT: 7 U/L (ref 7–35)
AST: 14 U/L (ref 13–35)
Alkaline Phosphatase: 61 U/L (ref 25–125)

## 2016-07-11 LAB — CBC AND DIFFERENTIAL
HEMATOCRIT: 25 % — AB (ref 36–46)
HEMOGLOBIN: 7.7 g/dL — AB (ref 12.0–16.0)
Platelets: 147 10*3/uL — AB (ref 150–399)
WBC: 3.5 10*3/mL

## 2016-07-12 ENCOUNTER — Encounter: Payer: Self-pay | Admitting: Family

## 2016-07-12 ENCOUNTER — Non-Acute Institutional Stay (SKILLED_NURSING_FACILITY): Payer: Commercial Managed Care - HMO | Admitting: Family

## 2016-07-12 DIAGNOSIS — E46 Unspecified protein-calorie malnutrition: Secondary | ICD-10-CM | POA: Diagnosis not present

## 2016-07-12 DIAGNOSIS — D638 Anemia in other chronic diseases classified elsewhere: Secondary | ICD-10-CM | POA: Diagnosis not present

## 2016-07-12 DIAGNOSIS — R531 Weakness: Secondary | ICD-10-CM | POA: Diagnosis not present

## 2016-07-12 DIAGNOSIS — N184 Chronic kidney disease, stage 4 (severe): Secondary | ICD-10-CM

## 2016-07-12 DIAGNOSIS — E1149 Type 2 diabetes mellitus with other diabetic neurological complication: Secondary | ICD-10-CM

## 2016-07-12 DIAGNOSIS — R600 Localized edema: Secondary | ICD-10-CM | POA: Diagnosis not present

## 2016-07-12 LAB — BASIC METABOLIC PANEL: Glucose: 104 mg/dL

## 2016-07-12 NOTE — Progress Notes (Signed)
Location:   [place] Room Number: 602-P Place of Service:  SNF (31) Provider:  Twanna Resh  FNP-C  [name], MD  Patient Care Team: [name], MD as PCP - General (Family Medicine)  Extended Emergency Contact Information Primary Emergency Contact: [place] Phone: [phone] Mobile Phone: [phone] Relation: Son Secondary Emergency Contact: [place] of [place] Phone: [phone] Mobile Phone: [phone] Relation: Relative  Code Status:  Full Code Goals of care: Advanced Directive information Advanced Directives 06/13/2016  Does patient have an advance directive? No  Would patient like information on creating an advanced directive? No - patient declined information     Chief Complaint  Patient presents with  . Medical Management of Chronic Issues    HPI:  Pt is a 65 y.o. female seen today at [facility] for an acute visit for evaluation of abnormal lab results. She is seen in her room today.She complains of feeling tired status unable to do much with PT/OT due to tiredness. Also request apple juice to be left at her bedside table at night states feels shaky at night at times and trys to look for juice thinking she is at home then ends up falling. Her recent lab result showed Hgb 7.7, PLTs 147, TP 4.6, Alb 2.51, CR 2.59, BUN 52 ( 07/11/2016). She denies any fever, chills, cough, dizziness, shortness of breath.Facility staff reports patient refused Glucerna states makes her sick on her stomach. Glucerna discontinued now on Med pass.    Past Medical History  Diagnosis Date  . DIABETES MELLITUS, II, COMPLICATIONS 99991111  . HYPERLIPIDEMIA 02/27/2007  . 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 ANEMIA, OTHER, UNSPECIFIED 02/27/2007  . DEPRESSIVE DISORDER, NOS 02/27/2007  . HYPERTENSION, BENIGN SYSTEMIC 02/27/2007  . CORONARY, ARTERIOSCLEROSIS 02/27/2007    . TIA 05/17/2009  . GASTROESOPHAGEAL REFLUX, NO ESOPHAGITIS 02/27/2007  . RENAL INSUFFICIENCY, CHRONIC 12/28/2009  . Renal failure, acute on chronic (HCC) 01/25/2014  . Blood transfusion without reported diagnosis   . Anemia   . Cellulitis and abscess of unspecified site 05/02/2010    Qualifier: Diagnosis of  By: Deborra Medina MD, Talia     Past Surgical History  Procedure Laterality Date  . Partial hysterectomy    . Rectocele repair    . Bladder surgery      Tack  . Hip arthroplasty Right 06/07/2016    Procedure: ARTHROPLASTY BIPOLAR HIP (HEMIARTHROPLASTY);  Surgeon: Altamese [place], MD;  Location: [facility];  Service: Orthopedics;  Laterality: Right;    Allergies  Allergen Reactions  . Codeine Other (See Comments)    Noted as an allergy on MAR  . Hydralazine Hcl Swelling  . Bisphosphonates Rash  . Codeine Phosphate Nausea And Vomiting and Rash  . Penicillins Itching, Rash and Other (See Comments)    Has patient had a PCN reaction causing immediate rash, facial/tongue/throat swelling, SOB or lightheadedness with hypotension: No Has patient had a PCN reaction causing severe rash involving mucus membranes or skin necrosis: No Has patient had a PCN reaction that required hospitalization No Has patient had a PCN reaction occurring within the last 10 years: No If all of the above answers are "NO", then may proceed with Cephalosporin use.  Pt tolerant to cephalosporins   . Sulfa Antibiotics Itching and Rash      Medication List       This list is accurate as of: 07/12/16  2:40 PM.  Always use  your most recent med list.               acetaminophen 325 MG tablet  Commonly known as:  TYLENOL  Take 2 tablets (650 mg total) by mouth every 6 (six) hours as needed for mild pain (or Fever >/= 101).     ALPRAZolam 0.5 MG tablet  Commonly known as:  XANAX  Take 1 tablet (0.5 mg total) by mouth 3 (three) times daily as needed for anxiety.     calcium acetate 667 MG capsule  Commonly known as:   PHOSLO  Take 2 capsules (1,334 mg total) by mouth 3 (three) times daily with meals.     carvedilol 3.125 MG tablet  Commonly known as:  COREG  Take 1 tablet (3.125 mg total) by mouth 2 (two) times daily with a meal.     clopidogrel 75 MG tablet  Commonly known as:  PLAVIX  Take 75 mg by mouth at bedtime. For CAD     diphenoxylate-atropine 2.5-0.025 MG/5ML liquid  Commonly known as:  LOMOTIL  Take 10 mLs by mouth 4 (four) times daily as needed for diarrhea or loose stools.     Fenofibric Acid 135 MG Cpdr  Take 135 mg by mouth at bedtime.     FLUoxetine 20 MG capsule  Commonly known as:  PROZAC  Take 20 mg by mouth daily. For depression     gabapentin 300 MG capsule  Commonly known as:  NEURONTIN  Take 600 mg by mouth 3 (three) times daily.     insulin glargine 100 UNIT/ML injection  Commonly known as:  LANTUS  Inject 8 Units into the skin at bedtime.     insulin lispro 100 UNIT/ML injection  Commonly known as:  HUMALOG  Inject 3 Units into the skin 3 (three) times daily before meals.     isosorbide mononitrate 60 MG 24 hr tablet  Commonly known as:  IMDUR  Take 1 tablet (60 mg total) by mouth daily.     lip balm Oint  Apply 1 application topically as needed for lip care.     nitroGLYCERIN 0.4 MG SL tablet  Commonly known as:  NITROSTAT  Place 0.4 mg under the tongue every 5 (five) minutes as needed for chest pain. Reported on 06/13/2016     NON FORMULARY  Med-Pass twice daily.     pantoprazole 40 MG tablet  Commonly known as:  PROTONIX  Take 1 tablet (40 mg total) by mouth 2 (two) times daily.     potassium chloride 20 MEQ packet  Commonly known as:  KLOR-CON  once. Take 120 ml of potassium twice daily with Med-Pass product by mouth.     promethazine 25 MG suppository  Commonly known as:  PHENERGAN  Place 25 mg rectally every 12 (twelve) hours as needed for nausea or vomiting.     simvastatin 20 MG tablet  Commonly known as:  ZOCOR  Take 20 mg by mouth at  bedtime. For hyperlipidemia     topiramate 100 MG tablet  Commonly known as:  TOPAMAX  Take 100 mg by mouth 2 (two) times daily.     Vitamin D3 5000 units Tabs  Take 5,000 Units by mouth daily.        Review of Systems  Constitutional: Positive for fatigue. Negative for fever, chills and activity change.  HENT: Negative for congestion, rhinorrhea, sinus pressure, sneezing and sore throat.   Respiratory: Negative for cough, chest tightness, shortness of breath and wheezing.  Cardiovascular: Positive for leg swelling. Negative for chest pain and palpitations.  Gastrointestinal: Negative for nausea, vomiting, abdominal pain, diarrhea, constipation and abdominal distention.  Endocrine: Negative.   Genitourinary: Negative for dysuria, urgency, frequency and flank pain.  Musculoskeletal: Positive for gait problem.  Skin: Positive for pallor. Negative for color change and rash.  Neurological: Negative for dizziness, seizures, syncope, light-headedness and headaches.  Psychiatric/Behavioral: Negative for hallucinations, confusion, sleep disturbance and agitation. The patient is not nervous/anxious.     Immunization History  Administered Date(s) Administered  . Influenza Split 10/22/2012  . Influenza Whole 10/08/2008  . Influenza,inj,Quad PF,36+ Mos 02/08/2016  . PPD Test 07/04/2016  . Pneumococcal Polysaccharide-23 02/08/2016  . Td 05/31/2006   Pertinent  Health Maintenance Due  Topic Date Due  . FOOT EXAM  11/13/1959  . OPHTHALMOLOGY EXAM  11/13/1959  . URINE MICROALBUMIN  11/13/1959  . MAMMOGRAM  11/13/1999  . COLONOSCOPY  11/13/1999  . DEXA SCAN  11/12/2014  . INFLUENZA VACCINE  07/31/2016  . HEMOGLOBIN A1C  12/13/2016  . PNA vac Low Risk Adult (2 of 2 - PCV13) 02/07/2017   No flowsheet data found. Functional Status Survey:    Filed Vitals:   07/12/16 1416  BP: 150/78  Pulse: 88  Temp: 98.2 F (36.8 C)  Resp: 20  Height: 5\' 3"  (1.6 m)  Weight: 119 lb 8 oz  (54.205 kg)  SpO2: 98%   Body mass index is 21.17 kg/(m^2). Physical Exam  Labs reviewed:  Recent Labs  06/23/16 1603 06/24/16 06/24/16 0727  07/01/16 0218 07/02/16 0211 07/03/16 0300 07/04/16 0219 07/06/16 07/11/16  NA  --  142  --   < > 141 145 143 143 143  143 143  K  --  4.3  --   < > 4.9 5.3* 5.3* 4.5 4.1  4.1 4.1  CL  --  97*  --   < > 106 103 109 106  --   --   CO2  --  34*  --   < > 29 26 29 31   --   --   GLUCOSE  --  118*  --   < > 80 164* 102* 135*  --   --   BUN  --  121*  --   < > 83* 76* 70* 66* 56*  56* 52*  CREATININE  --  3.60*  --   < > 3.59* 3.46* 3.54* 3.32* 3.5*  3.5* 2.6*  CALCIUM  --  8.0*  --   < > 8.7* 9.8 8.8* 8.6*  --   --   MG 2.1 2.1 2.0  --   --   --   --   --   --   --   PHOS 5.9* 7.0* 7.0*  < > 3.8  --  3.7 3.6  --   --   < > = values in this interval not displayed.  Recent Labs  06/07/16 0350 06/13/16 1618 06/14/16 0320  06/27/16 0330 07/03/16 0300 07/04/16 0219 07/11/16  AST 23 44* 40  --   --   --   --  14  ALT 19 17 14   --   --   --   --  7  ALKPHOS 30* 78 65  --   --   --   --  61  BILITOT 1.1 2.3* 0.8  --   --   --   --   --   PROT 4.6* 4.5* 4.1*  --   --   --   --   --  ALBUMIN 2.4* 2.0* 1.8*  < > 1.7* 1.8* 1.7*  --   < > = values in this interval not displayed.  Recent Labs  06/09/16 0258  06/13/16 1618 06/14/16 0320  07/02/16 0211 07/03/16 0300 07/04/16 0219 07/11/16  WBC 4.2  < > 9.4 8.6  < > 5.5 4.7 3.6* 3.5  NEUTROABS 3.0  --  7.9* 7.6  --   --   --   --   --   HGB 7.9*  < > 9.7* 10.0*  < > 8.5* 7.9* 7.3* 7.7*  HCT 23.9*  < > 31.2* 30.0*  < > 27.6* 25.5* 23.4* 25*  MCV 88.2  < > 94.8 88.5  < > 96.8 98.1 97.5  --   PLT 89*  < > 149* 155  < > 220 203 198 147*  < > = values in this interval not displayed. Lab Results  Component Value Date   TSH 1.933 06/07/2016   Lab Results  Component Value Date   HGBA1C 6.8* 06/13/2016   Lab Results  Component Value Date   CHOL 97 06/24/2015   HDL 28* 06/24/2015    LDLCALC 52 06/24/2015   TRIG 87 06/24/2015   CHOLHDL 3.5 06/24/2015    Significant Diagnostic Results in last 30 days:  Dg Chest 1 View  06/15/2016  CLINICAL DATA:  Pneumonia EXAM: CHEST 1 VIEW COMPARISON:  06/13/2016 FINDINGS: Cardiac shadow is stable. Significant increase in bilateral infiltrates is seen. A component of this may be related to pulmonary edema as well. No bony abnormality is seen. IMPRESSION: Significant increase in bilateral infiltrates. Electronically Signed   By: Inez Catalina M.D.   On: 06/15/2016 11:25   US Abdomen Complete  06/13/2016  CLINICAL DATA:  Acute onset of generalized abdominal pain. Elevated lipase and bilirubin. Initial encounter. EXAM: ABDOMEN ULTRASOUND COMPLETE COMPARISON:  CT of the abdomen and pelvis performed 03/11/2016, and renal ultrasound performed 02/08/2016 FINDINGS: Gallbladder: No gallstones or wall thickening visualized. Trace sludge is noted within the gallbladder. No sonographic Murphy sign noted by sonographer. Common bile duct: Diameter: 0.3 cm, within normal limits in caliber. Liver: No focal lesion identified. Within normal limits in parenchymal echogenicity. Trace fluid is noted adjacent to the gallbladder fossa. IVC: No abnormality visualized. Pancreas: Visualized portion unremarkable. Spleen: Size and appearance within normal limits. Right Kidney: Length: 11.1 cm. Increased renal parenchymal echogenicity noted. Trace right-sided perinephric fluid is seen. No mass or hydronephrosis visualized. Left Kidney: Length: 10.0 cm. Increased renal parenchymal echogenicity noted. No mass or hydronephrosis visualized. Abdominal aorta: No aneurysm visualized. Scattered calcification is seen along the abdominal aorta. Other findings: Small bilateral pleural effusions are seen. IMPRESSION: 1. No acute abnormality seen within the abdomen. 2. Trace sludge within the gallbladder. Gallbladder otherwise unremarkable. 3. Increased renal parenchymal echogenicity likely  reflects medical renal disease. 4. Trace fluid noted adjacent to the gallbladder fossa. 5. Trace right-sided perinephric fluid seen. 6. Small bilateral pleural effusions noted. 7. Scattered calcification along the abdominal aorta. Electronically Signed   By: Garald Balding M.D.   On: 06/13/2016 23:36   US Renal  06/25/2016  CLINICAL DATA:  65 year old diabetic hypertensive female with stage 3 kidney disease. Subsequent encounter. EXAM: RENAL / URINARY TRACT ULTRASOUND COMPLETE COMPARISON:  06/13/2016 ultrasound.  03/11/2016 CT. FINDINGS: Right Kidney: Length: 10.6 cm. Echogenicity within normal limits. No mass or hydronephrosis visualized. Left Kidney: Length: 10.7 cm. Echogenicity within normal limits. No mass or hydronephrosis visualized. Bladder: Appears normal for degree of bladder distention. IMPRESSION: No  hydronephrosis. Limited evaluation of bladder secondary to decompression. Right-sided pleural effusion suspected. Electronically Signed   By: Genia Del M.D.   On: 06/25/2016 17:13   US Biopsy  07/02/2016  INDICATION: Renal failure EXAM: ULTRASOUND-GUIDED BIOPSY RENAL CORTEX.  CORE. MEDICATIONS: None. ANESTHESIA/SEDATION: Fentanyl 50 mcg IV; Versed 1 mg IV Moderate Sedation Time:  10 The patient was continuously monitored during the procedure by the interventional radiology nurse under my direct supervision. FLUOROSCOPY TIME:  Fluoroscopy Time:  minutes  seconds ( mGy). COMPLICATIONS: None immediate. PROCEDURE: Informed written consent was obtained from the patient after a thorough discussion of the procedural risks, benefits and alternatives. All questions were addressed. Maximal Sterile Barrier Technique was utilized including caps, mask, sterile gowns, sterile gloves, sterile drape, hand hygiene and skin antiseptic. A timeout was performed prior to the initiation of the procedure. The back was prepped with ChloraPrep in a sterile fashion, and a sterile drape was applied covering the operative  field. A sterile gown and sterile gloves were used for the procedure. Under sonographic guidance, two 16 gauge core biopsies of the left lower pole renal cortex were obtain. Final imaging was performed. Patient tolerated the procedure well without complication. Vital sign monitoring by nursing staff during the procedure will continue as patient is in the special procedures unit for post procedure observation. FINDINGS: The images document guide needle placement within the left lower pole renal cortex. Post biopsy images demonstrate minimal perinephric hemorrhage. IMPRESSION: Successful ultrasound-guided random renal cortex core biopsy. Electronically Signed   By: Marybelle Killings M.D.   On: 07/02/2016 14:12   Dg Chest Port 1 View  06/22/2016  CLINICAL DATA:  Acute respiratory failure EXAM: PORTABLE CHEST 1 VIEW COMPARISON:  06/20/2016 FINDINGS: 0652 hours. Patient rotated to the right. Lungs are hyperexpanded. Interstitial markings are diffusely coarsened with chronic features. The diffuse bilateral airspace disease seen previously is decreased in the interval. The cardio pericardial silhouette is enlarged. Feeding tube is coiled in the stomach with the tip positioned in the region of the antrum. Telemetry leads overlie the chest. IMPRESSION: Interval decrease in bilateral diffuse airspace disease. Electronically Signed   By: Misty Stanley M.D.   On: 06/22/2016 08:17   Dg Chest Port 1 View  06/20/2016  CLINICAL DATA:  Smoker, Healthcare associated pneumonia EXAM: PORTABLE CHEST 1 VIEW COMPARISON:  06/17/2016 FINDINGS: Cardiomediastinal silhouette is stable. Again noted diffuse bilateral infiltrates right greater than left with slight improvement in aeration. NG feeding tube coiled within stomach with tip not included in the film. Degenerative changes left shoulder. IMPRESSION: Again noted diffuse bilateral infiltrates right greater than left with slight improvement in aeration from prior exam. Electronically  Signed   By: Lahoma Crocker M.D.   On: 06/20/2016 09:09   Dg Chest Port 1 View  06/17/2016  CLINICAL DATA:  Patient with acute respiratory failure. Severe shortness of breath. EXAM: PORTABLE CHEST 1 VIEW COMPARISON:  Chest radiograph 06/15/2016. FINDINGS: Monitoring leads overlie the patient. Cardiomediastinal contours largely obscured. Grossly unchanged diffuse bilateral airspace opacities. Probable small bilateral pleural Fahrenheit scratch of small bilateral pleural effusions. No pneumothorax. IMPRESSION: Grossly unchanged diffuse bilateral airspace opacities. Small bilateral pleural effusions. Electronically Signed   By: Lovey Newcomer M.D.   On: 06/17/2016 08:03   Dg Chest Port 1 View  06/13/2016  CLINICAL DATA:  Altered mental status with abdominal pain for 2 days. History of diabetes and anemia. EXAM: PORTABLE CHEST 1 VIEW COMPARISON:  06/05/2016 and 02/07/2016 radiographs. FINDINGS: 1729 hours. There are lower  lung volumes. Allowing for this, the heart size and mediastinal contours are stable. There are new left-greater-than-right perihilar and lower lobe airspace opacities with a probable small left pleural effusion. No evidence of pneumothorax. The bones appear unchanged. IMPRESSION: New lower lung volumes with left-greater-than-right perihilar and lower lobe airspace opacities. These findings are nonspecific and may reflect asymmetric edema, although are worrisome for possible aspiration. Electronically Signed   By: Richardean Sale M.D.   On: 06/13/2016 17:39   Dg Abd Portable 1v  06/18/2016  CLINICAL DATA:  Feeding tube placement EXAM: PORTABLE ABDOMEN - 1 VIEW COMPARISON:  06/13/2016 FINDINGS: Feeding tube coils in the proximal stomach with the tip in the distal stomach. Nonobstructive bowel gas pattern. IMPRESSION: Feeding tube tip in the distal stomach. Electronically Signed   By: Rolm Baptise M.D.   On: 06/18/2016 13:00   Dg Abd Portable 1v  06/13/2016  CLINICAL DATA:  Altered mental status with  abdominal pain for 2 days. History of diabetes and anemia. EXAM: PORTABLE ABDOMEN - 1 VIEW COMPARISON:  Radiographs 06/06/2016.  CT 03/11/2016. FINDINGS: 1728 hours. Prominent stool is again noted within the rectum. The bowel gas pattern is nonobstructive. There is no evidence of bowel wall thickening or free air. Interval right hip hemiarthroplasty and scattered vascular calcifications are noted. IMPRESSION: No acute abdominal findings. Prominent stool in the rectum, similar to prior study. Electronically Signed   By: Richardean Sale M.D.   On: 06/13/2016 17:41   Dg Hip Unilat With Pelvis 2-3 Views Right  07/04/2016  CLINICAL DATA:  Right hip pain seems to be getting worse, no known injury - had right hip surgery (replacement) about 1 month ago now EXAM: DG HIP (WITH OR WITHOUT PELVIS) 2-3V RIGHT COMPARISON:  06/07/2016 FINDINGS: Right hemiarthroplasty appears well-seated, and well-positioned without evidence of loosening. There is no acute fracture.  There is no bone lesion. SI joints, symphysis pubis and left hip joint are normally spaced and aligned. There are dense pelvic and femoral artery arterial vascular calcifications. There is diffuse soft tissue subcutaneous edema. IMPRESSION: 1. No acute fracture or dislocation. 2. Right hemiarthroplasty appears well-seated a well-positioned them without radiographic evidence of loosening. Electronically Signed   By: Lajean Manes M.D.   On: 07/04/2016 13:12    Assessment/Plan Anemia  Recent Hgb 7.7 ( 07/11/2016). Refer to oncologist for procrit infusion.   Type 2 DM Reports shaky at times at bedtime.Request apple juice at bedtime. Order bedtime snack with apple juice at bedtime.   CKD  Continue to monitor BMP   Protein malnutrition  Refused Glucerna protein supplement now on Med pass. Continue to encourage oral intake.   Edema  Order Bilateral Ted hose on in the morning and off at bedtime. Elevate legs while in bed.   Generalized weakness  Continue  with PT/OT for ROM , Exercise, Gait stability and Muscle strengthening.   Family/ staff Communication: Reviewed plan with patient and facility Nurse supervisor.  Labs/tests ordered:  CBC, BMP 07/19/2016

## 2016-07-13 ENCOUNTER — Encounter (HOSPITAL_COMMUNITY): Payer: Self-pay

## 2016-07-19 ENCOUNTER — Encounter: Payer: Self-pay | Admitting: Family

## 2016-07-19 ENCOUNTER — Non-Acute Institutional Stay (SKILLED_NURSING_FACILITY): Payer: Commercial Managed Care - HMO | Admitting: Family

## 2016-07-19 DIAGNOSIS — M25562 Pain in left knee: Secondary | ICD-10-CM

## 2016-07-19 DIAGNOSIS — R600 Localized edema: Secondary | ICD-10-CM | POA: Diagnosis not present

## 2016-07-19 DIAGNOSIS — N184 Chronic kidney disease, stage 4 (severe): Secondary | ICD-10-CM

## 2016-07-19 DIAGNOSIS — M25561 Pain in right knee: Secondary | ICD-10-CM

## 2016-07-19 NOTE — Progress Notes (Signed)
Patient ID: [name], female   DOB: 1951-10-02, 65 y.o.   MRN: [id number]  Location:    [place] Room Number: 602-P Place of Service:  SNF [phone]) Provider: Grady Lucci FNP-C   [name], MD  Patient Care Team: [name], MD as PCP - General (Family Medicine)  Extended Emergency Contact Information Primary Emergency Contact: [place] of [place] Phone: [phone] Mobile Phone: [phone] Relation: Son Secondary Emergency Contact: [place] of [place] Phone: [phone] Mobile Phone: [phone] Relation: Relative  Code Status:  Full Code  Goals of care: Advanced Directive information Advanced Directives 06/13/2016  Does patient have an advance directive? No  Would patient like information on creating an advanced directive? No - patient declined information     Chief Complaint  Patient presents with  . Acute Visit    HPI:  Pt is a 65 y.o. female seen today at [facility]   for an acute visit for evaluation of knee pain and swelling on the legs. She is seen in her room today per patient's nurse request. She complains of worsening knee pain worst on the left knee. She has been working with Physical Therapy but states whenever she bears weight in a certain way pain worsen rating 10/10 on scale. She also reports worsening leg edema. Ted hose ordered in previous visit but not in place. She denies any fever, chills, shortness of breath or wheezing. She has been coughing up white phlegm.     Past Medical History  Diagnosis Date  . DIABETES MELLITUS, II, COMPLICATIONS 99991111  . HYPERLIPIDEMIA 02/27/2007  . 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 ANEMIA, OTHER, UNSPECIFIED 02/27/2007  . DEPRESSIVE DISORDER, NOS 02/27/2007  . HYPERTENSION, BENIGN SYSTEMIC 02/27/2007  . CORONARY, ARTERIOSCLEROSIS 02/27/2007  . TIA 05/17/2009  . GASTROESOPHAGEAL REFLUX, NO  ESOPHAGITIS 02/27/2007  . RENAL INSUFFICIENCY, CHRONIC 12/28/2009  . Renal failure, acute on chronic (HCC) 01/25/2014  . Blood transfusion without reported diagnosis   . Anemia   . Cellulitis and abscess of unspecified site 05/02/2010    Qualifier: Diagnosis of  By: Deborra Medina MD, Talia     Past Surgical History  Procedure Laterality Date  . Partial hysterectomy    . Rectocele repair    . Bladder surgery      Tack  . Hip arthroplasty Right 06/07/2016    Procedure: ARTHROPLASTY BIPOLAR HIP (HEMIARTHROPLASTY);  Surgeon: Altamese , MD;  Location: [facility];  Service: Orthopedics;  Laterality: Right;    Allergies  Allergen Reactions  . Codeine Other (See Comments)    Noted as an allergy on MAR  . Hydralazine Hcl Swelling  . Bisphosphonates Rash  . Codeine Phosphate Nausea And Vomiting and Rash  . Penicillins Itching, Rash and Other (See Comments)    Has patient had a PCN reaction causing immediate rash, facial/tongue/throat swelling, SOB or lightheadedness with hypotension: No Has patient had a PCN reaction causing severe rash involving mucus membranes or skin necrosis: No Has patient had a PCN reaction that required hospitalization No Has patient had a PCN reaction occurring within the last 10 years: No If all of the above answers are "NO", then may proceed with Cephalosporin use.  Pt tolerant to cephalosporins   . Sulfa Antibiotics Itching and Rash      Medication List       This list is accurate as of: 07/19/16 12:32 PM.  Always use your most recent med  list.               acetaminophen 325 MG tablet  Commonly known as:  TYLENOL  Take 2 tablets (650 mg total) by mouth every 6 (six) hours as needed for mild pain (or Fever >/= 101).     ALPRAZolam 0.5 MG tablet  Commonly known as:  XANAX  Take 1 tablet (0.5 mg total) by mouth 3 (three) times daily as needed for anxiety.     calcium acetate 667 MG capsule  Commonly known as:  PHOSLO  Take 2 capsules (1,334 mg total) by mouth 3  (three) times daily with meals.     carvedilol 3.125 MG tablet  Commonly known as:  COREG  Take 1 tablet (3.125 mg total) by mouth 2 (two) times daily with a meal.     clopidogrel 75 MG tablet  Commonly known as:  PLAVIX  Take 75 mg by mouth at bedtime. For CAD     diphenoxylate-atropine 2.5-0.025 MG/5ML liquid  Commonly known as:  LOMOTIL  Take 10 mLs by mouth 4 (four) times daily as needed for diarrhea or loose stools.     Fenofibric Acid 135 MG Cpdr  Take 135 mg by mouth at bedtime.     FLUoxetine 20 MG capsule  Commonly known as:  PROZAC  Take 20 mg by mouth daily. For depression     gabapentin 300 MG capsule  Commonly known as:  NEURONTIN  Take 600 mg by mouth 3 (three) times daily.     insulin glargine 100 UNIT/ML injection  Commonly known as:  LANTUS  Inject 6 Units into the skin at bedtime.     insulin lispro 100 UNIT/ML injection  Commonly known as:  HUMALOG  Inject 3 Units into the skin 3 (three) times daily before meals.     isosorbide mononitrate 60 MG 24 hr tablet  Commonly known as:  IMDUR  Take 1 tablet (60 mg total) by mouth daily.     lip balm Oint  Apply 1 application topically as needed for lip care.     nitroGLYCERIN 0.4 MG SL tablet  Commonly known as:  NITROSTAT  Place 0.4 mg under the tongue every 5 (five) minutes as needed for chest pain. Reported on 06/13/2016     pantoprazole 40 MG tablet  Commonly known as:  PROTONIX  Take 1 tablet (40 mg total) by mouth 2 (two) times daily.     potassium chloride 20 MEQ packet  Commonly known as:  KLOR-CON  once. Take 120 ml of potassium twice daily with Med-Pass product by mouth.     simvastatin 20 MG tablet  Commonly known as:  ZOCOR  Take 20 mg by mouth at bedtime. For hyperlipidemia     topiramate 100 MG tablet  Commonly known as:  TOPAMAX  Take 100 mg by mouth 2 (two) times daily.     UNABLE TO FIND  Med Name: Med pass 120 ml 2 times daily     Vitamin D3 5000 units Tabs  Take 5,000 Units  by mouth daily.        Review of Systems  Constitutional: Negative for fever, chills and activity change.  HENT: Negative for congestion, rhinorrhea, sinus pressure, sneezing and sore throat.   Respiratory: Negative for cough, chest tightness, shortness of breath and wheezing.   Cardiovascular: Positive for leg swelling. Negative for chest pain and palpitations.       Awaiting Ted hose  Gastrointestinal: Negative for nausea, vomiting, abdominal pain,  diarrhea, constipation and abdominal distention.  Endocrine: Negative.   Genitourinary: Negative for dysuria, urgency, frequency and flank pain.  Musculoskeletal: Positive for gait problem.       Bilateral Knee pain rating 10/10 on scale   Skin: Negative for color change and rash.  Neurological: Negative for dizziness, seizures, syncope, light-headedness and headaches.  Psychiatric/Behavioral: Negative for hallucinations, confusion, sleep disturbance and agitation. The patient is not nervous/anxious.     Immunization History  Administered Date(s) Administered  . Influenza Split 10/22/2012  . Influenza Whole 10/08/2008  . Influenza,inj,Quad PF,36+ Mos 02/08/2016  . PPD Test 07/04/2016  . Pneumococcal Polysaccharide-23 02/08/2016  . Td 05/31/2006   Pertinent  Health Maintenance Due  Topic Date Due  . FOOT EXAM  11/13/1959  . OPHTHALMOLOGY EXAM  11/13/1959  . URINE MICROALBUMIN  11/13/1959  . MAMMOGRAM  11/13/1999  . COLONOSCOPY  11/13/1999  . DEXA SCAN  11/12/2014  . INFLUENZA VACCINE  07/31/2016  . HEMOGLOBIN A1C  12/13/2016  . PNA vac Low Risk Adult (2 of 2 - PCV13) 02/07/2017   No flowsheet data found. Functional Status Survey:    Filed Vitals:   07/19/16 1051  BP: 122/55  Pulse: 90  Temp: 98 F (36.7 C)  TempSrc: Oral  Resp: 20  Height: 5\' 3"  (1.6 m)  Weight: 119 lb 8 oz (54.205 kg)  SpO2: 95%   Body mass index is 21.17 kg/(m^2). Physical Exam  Constitutional: She is oriented to person, place, and time. No  distress.  HENT:  Head: Normocephalic.  Mouth/Throat: Oropharynx is clear and moist. No oropharyngeal exudate.  Eyes: Conjunctivae and EOM are normal. Pupils are equal, round, and reactive to light. Right eye exhibits no discharge. Left eye exhibits no discharge. No scleral icterus.  Neck: Normal range of motion. No JVD present. No thyromegaly present.  Cardiovascular: Normal rate, regular rhythm, normal heart sounds and intact distal pulses.  Exam reveals no gallop and no friction rub.   No murmur heard. Pulmonary/Chest: Effort normal and breath sounds normal. No respiratory distress. She has no wheezes. She has no rales.  Abdominal: Soft. Bowel sounds are normal. She exhibits no distension. There is no tenderness. There is no rebound and no guarding.  Musculoskeletal: She exhibits no tenderness.  Limited ROM to left knee due to pain. Negative for redness or effusion. Bilateral Lower extremities 2-3+ edema.   Lymphadenopathy:    She has no cervical adenopathy.  Neurological: She is oriented to person, place, and time.  Skin: Skin is warm and dry. No rash noted. No erythema. No pallor.  Psychiatric: She has a normal mood and affect.    Labs reviewed:  Recent Labs  06/23/16 1603 06/24/16 06/24/16 0727  07/01/16 0218 07/02/16 0211 07/03/16 0300 07/04/16 0219 07/06/16 07/11/16  NA  --  142  --   < > 141 145 143 143 143  143 143  K  --  4.3  --   < > 4.9 5.3* 5.3* 4.5 4.1  4.1 4.1  CL  --  97*  --   < > 106 103 109 106  --   --   CO2  --  34*  --   < > 29 26 29 31   --   --   GLUCOSE  --  118*  --   < > 80 164* 102* 135*  --   --   BUN  --  121*  --   < > 83* 76* 70* 66* 56*  56* 52*  CREATININE  --  3.60*  --   < > 3.59* 3.46* 3.54* 3.32* 3.5*  3.5* 2.6*  CALCIUM  --  8.0*  --   < > 8.7* 9.8 8.8* 8.6*  --   --   MG 2.1 2.1 2.0  --   --   --   --   --   --   --   PHOS 5.9* 7.0* 7.0*  < > 3.8  --  3.7 3.6  --   --   < > = values in this interval not displayed.  Recent Labs   06/07/16 0350 06/13/16 1618 06/14/16 0320  06/27/16 0330 07/03/16 0300 07/04/16 0219 07/11/16  AST 23 44* 40  --   --   --   --  14  ALT 19 17 14   --   --   --   --  7  ALKPHOS 30* 78 65  --   --   --   --  61  BILITOT 1.1 2.3* 0.8  --   --   --   --   --   PROT 4.6* 4.5* 4.1*  --   --   --   --   --   ALBUMIN 2.4* 2.0* 1.8*  < > 1.7* 1.8* 1.7*  --   < > = values in this interval not displayed.  Recent Labs  06/09/16 0258  06/13/16 1618 06/14/16 0320  07/02/16 0211 07/03/16 0300 07/04/16 0219 07/11/16  WBC 4.2  < > 9.4 8.6  < > 5.5 4.7 3.6* 3.5  NEUTROABS 3.0  --  7.9* 7.6  --   --   --   --   --   HGB 7.9*  < > 9.7* 10.0*  < > 8.5* 7.9* 7.3* 7.7*  HCT 23.9*  < > 31.2* 30.0*  < > 27.6* 25.5* 23.4* 25*  MCV 88.2  < > 94.8 88.5  < > 96.8 98.1 97.5  --   PLT 89*  < > 149* 155  < > 220 203 198 147*  < > = values in this interval not displayed. Lab Results  Component Value Date   TSH 1.933 06/07/2016   Lab Results  Component Value Date   HGBA1C 6.8* 06/13/2016   Lab Results  Component Value Date   CHOL 97 06/24/2015   HDL 28* 06/24/2015   LDLCALC 52 06/24/2015   TRIG 87 06/24/2015   CHOLHDL 3.5 06/24/2015    Significant Diagnostic Results in last 30 days:  US Renal  06/25/2016  CLINICAL DATA:  65 year old diabetic hypertensive female with stage 3 kidney disease. Subsequent encounter. EXAM: RENAL / URINARY TRACT ULTRASOUND COMPLETE COMPARISON:  06/13/2016 ultrasound.  03/11/2016 CT. FINDINGS: Right Kidney: Length: 10.6 cm. Echogenicity within normal limits. No mass or hydronephrosis visualized. Left Kidney: Length: 10.7 cm. Echogenicity within normal limits. No mass or hydronephrosis visualized. Bladder: Appears normal for degree of bladder distention. IMPRESSION: No hydronephrosis. Limited evaluation of bladder secondary to decompression. Right-sided pleural effusion suspected. Electronically Signed   By: Genia Del M.D.   On: 06/25/2016 17:13   US Biopsy  07/02/2016   INDICATION: Renal failure EXAM: ULTRASOUND-GUIDED BIOPSY RENAL CORTEX.  CORE. MEDICATIONS: None. ANESTHESIA/SEDATION: Fentanyl 50 mcg IV; Versed 1 mg IV Moderate Sedation Time:  10 The patient was continuously monitored during the procedure by the interventional radiology nurse under my direct supervision. FLUOROSCOPY TIME:  Fluoroscopy Time:  minutes  seconds ( mGy). COMPLICATIONS: None immediate. PROCEDURE: Informed written consent was obtained  from the patient after a thorough discussion of the procedural risks, benefits and alternatives. All questions were addressed. Maximal Sterile Barrier Technique was utilized including caps, mask, sterile gowns, sterile gloves, sterile drape, hand hygiene and skin antiseptic. A timeout was performed prior to the initiation of the procedure. The back was prepped with ChloraPrep in a sterile fashion, and a sterile drape was applied covering the operative field. A sterile gown and sterile gloves were used for the procedure. Under sonographic guidance, two 16 gauge core biopsies of the left lower pole renal cortex were obtain. Final imaging was performed. Patient tolerated the procedure well without complication. Vital sign monitoring by nursing staff during the procedure will continue as patient is in the special procedures unit for post procedure observation. FINDINGS: The images document guide needle placement within the left lower pole renal cortex. Post biopsy images demonstrate minimal perinephric hemorrhage. IMPRESSION: Successful ultrasound-guided random renal cortex core biopsy. Electronically Signed   By: Marybelle Killings M.D.   On: 07/02/2016 14:12   Dg Chest Port 1 View  06/22/2016  CLINICAL DATA:  Acute respiratory failure EXAM: PORTABLE CHEST 1 VIEW COMPARISON:  06/20/2016 FINDINGS: 0652 hours. Patient rotated to the right. Lungs are hyperexpanded. Interstitial markings are diffusely coarsened with chronic features. The diffuse bilateral airspace disease seen  previously is decreased in the interval. The cardio pericardial silhouette is enlarged. Feeding tube is coiled in the stomach with the tip positioned in the region of the antrum. Telemetry leads overlie the chest. IMPRESSION: Interval decrease in bilateral diffuse airspace disease. Electronically Signed   By: Misty Stanley M.D.   On: 06/22/2016 08:17   Dg Chest Port 1 View  06/20/2016  CLINICAL DATA:  Smoker, Healthcare associated pneumonia EXAM: PORTABLE CHEST 1 VIEW COMPARISON:  06/17/2016 FINDINGS: Cardiomediastinal silhouette is stable. Again noted diffuse bilateral infiltrates right greater than left with slight improvement in aeration. NG feeding tube coiled within stomach with tip not included in the film. Degenerative changes left shoulder. IMPRESSION: Again noted diffuse bilateral infiltrates right greater than left with slight improvement in aeration from prior exam. Electronically Signed   By: Lahoma Crocker M.D.   On: 06/20/2016 09:09   Dg Hip Unilat With Pelvis 2-3 Views Right  07/04/2016  CLINICAL DATA:  Right hip pain seems to be getting worse, no known injury - had right hip surgery (replacement) about 1 month ago now EXAM: DG HIP (WITH OR WITHOUT PELVIS) 2-3V RIGHT COMPARISON:  06/07/2016 FINDINGS: Right hemiarthroplasty appears well-seated, and well-positioned without evidence of loosening. There is no acute fracture.  There is no bone lesion. SI joints, symphysis pubis and left hip joint are normally spaced and aligned. There are dense pelvic and femoral artery arterial vascular calcifications. There is diffuse soft tissue subcutaneous edema. IMPRESSION: 1. No acute fracture or dislocation. 2. Right hemiarthroplasty appears well-seated a well-positioned them without radiographic evidence of loosening. Electronically Signed   By: Lajean Manes M.D.   On: 07/04/2016 13:12    Assessment/Plan 1. Localized edema Bilateral Lower extremities 2-3+ edema. Ted hose not in place. Start Torsemide 20 mg  Tablet twice daily X 3 days then 20 mg Tablet daily. BMP 07/23/2016.    2. Arthralgia of both knees Worst on left knee.Afebrile. Obtain portable left knee X-ray 3 views due to acute pain r/o FX.  Biofreeze 4% cream apply to both knees Q 8 HRS and Tramadol 25 mg Tablet one by mouth Q 12 HRs  PRN    3.CKD stage IV  Monitor BMP     Family/ staff Communication:Reviewed plan with patient and facility Nurse Supervisor.  Labs/tests ordered: BMP 07/23/2016.

## 2016-07-23 ENCOUNTER — Non-Acute Institutional Stay (SKILLED_NURSING_FACILITY): Payer: Commercial Managed Care - HMO | Admitting: Family

## 2016-07-23 DIAGNOSIS — R531 Weakness: Secondary | ICD-10-CM

## 2016-07-23 DIAGNOSIS — E1149 Type 2 diabetes mellitus with other diabetic neurological complication: Secondary | ICD-10-CM

## 2016-07-23 DIAGNOSIS — I2510 Atherosclerotic heart disease of native coronary artery without angina pectoris: Secondary | ICD-10-CM

## 2016-07-23 DIAGNOSIS — G43711 Chronic migraine without aura, intractable, with status migrainosus: Secondary | ICD-10-CM

## 2016-07-23 DIAGNOSIS — E785 Hyperlipidemia, unspecified: Secondary | ICD-10-CM

## 2016-07-23 DIAGNOSIS — Z96649 Presence of unspecified artificial hip joint: Secondary | ICD-10-CM

## 2016-07-23 DIAGNOSIS — K219 Gastro-esophageal reflux disease without esophagitis: Secondary | ICD-10-CM

## 2016-07-23 DIAGNOSIS — N184 Chronic kidney disease, stage 4 (severe): Secondary | ICD-10-CM

## 2016-07-23 DIAGNOSIS — F411 Generalized anxiety disorder: Secondary | ICD-10-CM | POA: Diagnosis not present

## 2016-07-23 DIAGNOSIS — R269 Unspecified abnormalities of gait and mobility: Secondary | ICD-10-CM

## 2016-07-23 DIAGNOSIS — F329 Major depressive disorder, single episode, unspecified: Secondary | ICD-10-CM

## 2016-07-23 DIAGNOSIS — F32A Depression, unspecified: Secondary | ICD-10-CM

## 2016-07-23 DIAGNOSIS — D509 Iron deficiency anemia, unspecified: Secondary | ICD-10-CM | POA: Diagnosis not present

## 2016-07-23 DIAGNOSIS — I129 Hypertensive chronic kidney disease with stage 1 through stage 4 chronic kidney disease, or unspecified chronic kidney disease: Secondary | ICD-10-CM

## 2016-07-23 DIAGNOSIS — Z9660 Presence of unspecified orthopedic joint implant: Secondary | ICD-10-CM

## 2016-07-23 NOTE — Progress Notes (Signed)
Location:   [place] Room Number: [phone]  Place of Service:  SNF [phone])  Provider:Chett Taniguchi FNP-C   PCP: [name], MD Patient Care Team: [name], MD as PCP - General (Family Medicine)  Extended Emergency Contact Information Primary Emergency Contact: [place] of [place] Phone: [phone] Mobile Phone: [phone] Relation: Son Secondary Emergency Contact: Pump Back of [place] Phone: [phone] Mobile Phone: [phone] Relation: Relative  Code Status: Full code  Goals of care:  Advanced Directive information Advanced Directives 06/13/2016  Does patient have an advance directive? No  Would patient like information on creating an advanced directive? No - patient declined information       Chief Complaint  Patient presents with  . Discharge Note    HPI:  65 y.o. female seen today at [facility] for discharge home. She was here for short term rehabilitation post hospital admission from 06/13/16-07/04/16 with septic shock, acute respiratory failure from HCAP and pulmonary edema and acute on chronic renal failure. She required iv fluids, iv antibiotics and diuretics. She was in the hospital before this with right femoral neck fracture s/p arthroplasty on 06/07/16 with UTI and ABLA. She was at [facility] place for STR but had to be admitted back in the hospital. She has a medical history of HTN, CKD stage 4, Hyperlipidemia, Type 2 DM, Hx TIA, Depression, Anxiety, CAD, chronic Anemia among other conditions. She is seen in her room today. She has lower extremity edema recently started on Torsemide and Knee high Ted hose.she has chronic Anemia  Stable Hgb 7.7 Has appointment with oncologist 08/02/2016 for iron infusion. She has worked well with well with PT/OT but due to her insurance coverage will be discharge home.She will be discharged home with Home health PT/OT to continue  with ROM, Exercise, Gait stability and muscle strengthening. She will  require DME 16 X 18 standard WC with Cushion, anti tippers, extended brake handles, removable elevating leg rests  to allow her to maintain current level of independence with ADL's. Home health services will be arranged by facility social worker prior to discharge. Prescription medication will be written x 1 month then patient to follow up with PCP in 1-2 weeks. Facility staff report no new concerns.       Past Medical History:  Diagnosis Date  . Anemia   . ANEMIA, OTHER, UNSPECIFIED 02/27/2007  . Blood transfusion without reported diagnosis   . Cellulitis and abscess of unspecified site 05/02/2010   Qualifier: Diagnosis of  By: Deborra Medina MD, Tanja Port    . CORONARY, ARTERIOSCLEROSIS 02/27/2007  . DEPRESSIVE DISORDER, NOS 02/27/2007  . DIABETES MELLITUS, II, COMPLICATIONS [id number]  . GASTROESOPHAGEAL REFLUX, NO ESOPHAGITIS 02/27/2007  . HYPERLIPIDEMIA 02/27/2007  . HYPERTENSION, BENIGN SYSTEMIC 02/27/2007  . 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 Renal failure, acute on chronic (HCC) 01/25/2014  . RENAL INSUFFICIENCY, CHRONIC 12/28/2009  . TIA 05/17/2009    Past Surgical History:  Procedure Laterality Date  . BLADDER SURGERY     Tack  . HIP ARTHROPLASTY Right 06/07/2016   Procedure: ARTHROPLASTY BIPOLAR HIP (HEMIARTHROPLASTY);  Surgeon: Altamese East , MD;  Location: [facility];  Service: Orthopedics;  Laterality: Right;  . PARTIAL HYSTERECTOMY    . RECTOCELE REPAIR        reports that she has never smoked. She has never used smokeless tobacco. She reports that she does not drink alcohol or use drugs. Social History   Social History  .  Marital status: Divorced    Spouse name: N/A  . Number of children: 2  . Years of education: N/A   Occupational History  . Disabled Unemployed   Social History Main Topics  . Smoking status: Never Smoker  . Smokeless tobacco: Never Used  . Alcohol use No  . Drug use: No  . Sexual activity: Not on  file   Other Topics Concern  . Not on file   Social History Narrative  . No narrative on file      Pertinent  Health Maintenance Due  Topic Date Due  . FOOT EXAM  11/13/1959  . OPHTHALMOLOGY EXAM  11/13/1959  . URINE MICROALBUMIN  11/13/1959  . MAMMOGRAM  11/13/1999  . COLONOSCOPY  11/13/1999  . DEXA SCAN  11/12/2014  . INFLUENZA VACCINE  07/31/2016  . HEMOGLOBIN A1C  12/13/2016  . PNA vac Low Risk Adult (2 of 2 - PCV13) 02/07/2017    Medications:   Medication List       Accurate as of 07/23/16  1:58 PM. Always use your most recent med list.          acetaminophen 325 MG tablet Commonly known as:  TYLENOL Take 2 tablets (650 mg total) by mouth every 6 (six) hours as needed for mild pain (or Fever >/= 101).   ALPRAZolam 0.5 MG tablet Commonly known as:  XANAX Take 1 tablet (0.5 mg total) by mouth 3 (three) times daily as needed for anxiety.   calcium acetate 667 MG capsule Commonly known as:  PHOSLO Take 2 capsules (1,334 mg total) by mouth 3 (three) times daily with meals.   carvedilol 3.125 MG tablet Commonly known as:  COREG Take 1 tablet (3.125 mg total) by mouth 2 (two) times daily with a meal.   clopidogrel 75 MG tablet Commonly known as:  PLAVIX Take 75 mg by mouth at bedtime. For CAD   diphenoxylate-atropine 2.5-0.025 MG/5ML liquid Commonly known as:  LOMOTIL Take 10 mLs by mouth 4 (four) times daily as needed for diarrhea or loose stools.   Fenofibric Acid 135 MG Cpdr Take 135 mg by mouth at bedtime.   FLUoxetine 20 MG capsule Commonly known as:  PROZAC Take 20 mg by mouth daily. For depression   gabapentin 300 MG capsule Commonly known as:  NEURONTIN Take 600 mg by mouth 3 (three) times daily.   insulin glargine 100 UNIT/ML injection Commonly known as:  LANTUS Inject 6 Units into the skin at bedtime.   insulin lispro 100 UNIT/ML injection Commonly known as:  HUMALOG Inject 3 Units into the skin 3 (three) times daily before meals.     isosorbide mononitrate 60 MG 24 hr tablet Commonly known as:  IMDUR Take 1 tablet (60 mg total) by mouth daily.   lip balm Oint Apply 1 application topically as needed for lip care.   nitroGLYCERIN 0.4 MG SL tablet Commonly known as:  NITROSTAT Place 0.4 mg under the tongue every 5 (five) minutes as needed for chest pain. Reported on 06/13/2016   pantoprazole 40 MG tablet Commonly known as:  PROTONIX Take 1 tablet (40 mg total) by mouth 2 (two) times daily.   potassium chloride 20 MEQ packet Commonly known as:  KLOR-CON once. Take 120 ml of potassium twice daily with Med-Pass product by mouth.   simvastatin 20 MG tablet Commonly known as:  ZOCOR Take 20 mg by mouth at bedtime. For hyperlipidemia   topiramate 100 MG tablet Commonly known as:  TOPAMAX Take 100 mg  by mouth 2 (two) times daily.   UNABLE TO FIND Med Name: Med pass 120 ml 2 times daily   Vitamin D3 5000 units Tabs Take 5,000 Units by mouth daily.       Review of Systems  Constitutional: Negative for activity change, chills and fever.  HENT: Negative for congestion, rhinorrhea, sinus pressure, sneezing and sore throat.   Eyes: Negative.   Respiratory: Negative for cough, chest tightness, shortness of breath and wheezing.   Cardiovascular: Positive for leg swelling. Negative for chest pain and palpitations.        Ted hose at bedside.   Gastrointestinal: Negative for abdominal distention, abdominal pain, constipation, diarrhea, nausea and vomiting.  Endocrine: Negative.   Genitourinary: Negative for dysuria, flank pain, frequency and urgency.  Musculoskeletal: Positive for gait problem.       Left  Knee pain    Skin: Negative for color change and rash.  Neurological: Negative for dizziness, seizures, syncope, light-headedness and headaches.  Hematological: Does not bruise/bleed easily.  Psychiatric/Behavioral: Negative for agitation, confusion, hallucinations and sleep disturbance. The patient is not  nervous/anxious.     Vitals:   07/23/16 1343  BP: 136/74  Resp: 20  Temp: 98 F (36.7 C)  SpO2: 99%  Weight: 115 lb (52.2 kg)  Height: 5\' 3"  (1.6 m)   Body mass index is 20.37 kg/m. Physical Exam  Constitutional: She is oriented to person, place, and time. No distress.  HENT:  Head: Normocephalic.  Mouth/Throat: Oropharynx is clear and moist. No oropharyngeal exudate.  Eyes: Conjunctivae and EOM are normal. Pupils are equal, round, and reactive to light. Right eye exhibits no discharge. Left eye exhibits no discharge. No scleral icterus.  Neck: Normal range of motion. No JVD present. No thyromegaly present.  Cardiovascular: Normal rate, regular rhythm, normal heart sounds and intact distal pulses.  Exam reveals no gallop and no friction rub.   No murmur heard. Pulmonary/Chest: Effort normal and breath sounds normal. No respiratory distress. She has no wheezes. She has no rales.  Abdominal: Soft. Bowel sounds are normal. She exhibits no distension. There is no tenderness. There is no rebound and no guarding.  Musculoskeletal: She exhibits no tenderness.  Limited ROM to left knee due to pain. Negative for redness or effusion. Bilateral Lower extremities 2 + edema.   Lymphadenopathy:    She has no cervical adenopathy.  Neurological: She is oriented to person, place, and time.  Skin: Skin is warm and dry. No rash noted. No erythema. No pallor.  Psychiatric: She has a normal mood and affect.    Labs reviewed: Basic Metabolic Panel:  Recent Labs  06/23/16 1603 06/24/16 0000 06/24/16 0727  07/01/16 0218 07/02/16 0211 07/03/16 0300 07/04/16 0219 07/06/16 07/11/16  NA  --  142  --   < > 141 145 143 143 143  143 143  K  --  4.3  --   < > 4.9 5.3* 5.3* 4.5 4.1  4.1 4.1  CL  --  97*  --   < > 106 103 109 106  --   --   CO2  --  34*  --   < > 29 26 29 31   --   --   GLUCOSE  --  118*  --   < > 80 164* 102* 135*  --   --   BUN  --  121*  --   < > 83* 76* 70* 66* 56*  56* 52*    CREATININE  --  3.60*  --   < > 3.59* 3.46* 3.54* 3.32* 3.5*  3.5* 2.6*  CALCIUM  --  8.0*  --   < > 8.7* 9.8 8.8* 8.6*  --   --   MG 2.1 2.1 2.0  --   --   --   --   --   --   --   PHOS 5.9* 7.0* 7.0*  < > 3.8  --  3.7 3.6  --   --   < > = values in this interval not displayed. Liver Function Tests:  Recent Labs  06/07/16 0350 06/13/16 1618 06/14/16 0320  06/27/16 0330 07/03/16 0300 07/04/16 0219 07/11/16  AST 23 44* 40  --   --   --   --  14  ALT 19 17 14   --   --   --   --  7  ALKPHOS 30* 78 65  --   --   --   --  61  BILITOT 1.1 2.3* 0.8  --   --   --   --   --   PROT 4.6* 4.5* 4.1*  --   --   --   --   --   ALBUMIN 2.4* 2.0* 1.8*  < > 1.7* 1.8* 1.7*  --   < > = values in this interval not displayed.  Recent Labs  06/13/16 1745  LIPASE 614*   No results for input(s): AMMONIA in the last 8760 hours. CBC:  Recent Labs  06/09/16 0258  06/13/16 1618 06/14/16 0320  07/02/16 0211 07/03/16 0300 07/04/16 0219 07/11/16  WBC 4.2  < > 9.4 8.6  < > 5.5 4.7 3.6* 3.5  NEUTROABS 3.0  --  7.9* 7.6  --   --   --   --   --   HGB 7.9*  < > 9.7* 10.0*  < > 8.5* 7.9* 7.3* 7.7*  HCT 23.9*  < > 31.2* 30.0*  < > 27.6* 25.5* 23.4* 25*  MCV 88.2  < > 94.8 88.5  < > 96.8 98.1 97.5  --   PLT 89*  < > 149* 155  < > 220 203 198 147*  < > = values in this interval not displayed. Cardiac Enzymes:  Recent Labs  06/14/16 2030 06/15/16 0203 06/15/16 0624  TROPONINI 3.80* 2.85* 2.64*   BNP: Invalid input(s): POCBNP CBG:  Recent Labs  07/03/16 2201 07/04/16 0623 07/04/16 1124  GLUCAP 82 78 188*    Procedures and Imaging Studies During Stay: US Renal  Result Date: 06/25/2016 CLINICAL DATA:  65 year old diabetic hypertensive female with stage 3 kidney disease. Subsequent encounter. EXAM: RENAL / URINARY TRACT ULTRASOUND COMPLETE COMPARISON:  06/13/2016 ultrasound.  03/11/2016 CT. FINDINGS: Right Kidney: Length: 10.6 cm. Echogenicity within normal limits. No mass or  hydronephrosis visualized. Left Kidney: Length: 10.7 cm. Echogenicity within normal limits. No mass or hydronephrosis visualized. Bladder: Appears normal for degree of bladder distention. IMPRESSION: No hydronephrosis. Limited evaluation of bladder secondary to decompression. Right-sided pleural effusion suspected. Electronically Signed   By: Genia Del M.D.   On: 06/25/2016 17:13   US Biopsy  Result Date: 07/02/2016 INDICATION: Renal failure EXAM: ULTRASOUND-GUIDED BIOPSY RENAL CORTEX.  CORE. MEDICATIONS: None. ANESTHESIA/SEDATION: Fentanyl 50 mcg IV; Versed 1 mg IV Moderate Sedation Time:  10 The patient was continuously monitored during the procedure by the interventional radiology nurse under my direct supervision. FLUOROSCOPY TIME:  Fluoroscopy Time:  minutes  seconds ( mGy). COMPLICATIONS: None immediate. PROCEDURE: Informed written consent  was obtained from the patient after a thorough discussion of the procedural risks, benefits and alternatives. All questions were addressed. Maximal Sterile Barrier Technique was utilized including caps, mask, sterile gowns, sterile gloves, sterile drape, hand hygiene and skin antiseptic. A timeout was performed prior to the initiation of the procedure. The back was prepped with ChloraPrep in a sterile fashion, and a sterile drape was applied covering the operative field. A sterile gown and sterile gloves were used for the procedure. Under sonographic guidance, two 16 gauge core biopsies of the left lower pole renal cortex were obtain. Final imaging was performed. Patient tolerated the procedure well without complication. Vital sign monitoring by nursing staff during the procedure will continue as patient is in the special procedures unit for post procedure observation. FINDINGS: The images document guide needle placement within the left lower pole renal cortex. Post biopsy images demonstrate minimal perinephric hemorrhage. IMPRESSION: Successful ultrasound-guided  random renal cortex core biopsy. Electronically Signed   By: Marybelle Killings M.D.   On: 07/02/2016 14:12   Dg Hip Unilat With Pelvis 2-3 Views Right  Result Date: 07/04/2016 CLINICAL DATA:  Right hip pain seems to be getting worse, no known injury - had right hip surgery (replacement) about 1 month ago now EXAM: DG HIP (WITH OR WITHOUT PELVIS) 2-3V RIGHT COMPARISON:  06/07/2016 FINDINGS: Right hemiarthroplasty appears well-seated, and well-positioned without evidence of loosening. There is no acute fracture.  There is no bone lesion. SI joints, symphysis pubis and left hip joint are normally spaced and aligned. There are dense pelvic and femoral artery arterial vascular calcifications. There is diffuse soft tissue subcutaneous edema. IMPRESSION: 1. No acute fracture or dislocation. 2. Right hemiarthroplasty appears well-seated a well-positioned them without radiographic evidence of loosening. Electronically Signed   By: Lajean Manes M.D.   On: 07/04/2016 13:12    Assessment/Plan:   1. Generalized weakness Has worked well with PT /OT much improvement.will discharge home with PT/OT to continue with ROM, Exercise, Gait stability and muscle strengthening.  2. Abnormality of gait Status post right femoral neck fracture s/p arthroplasty on 06/07/16. Will discharge home with PT/OT to continue with ROM, Exercise, Gait stability and muscle strengthening. Ordering DME 16 X 18 standard WC with Cushion, anti tippers, extended brake handles, removable elevating leg rests  to allow her to maintain current level of independence with ADL's. Fall and safety precautions.   3. 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 Continue on Lantus 6 units at bedtime  and Humalog 3 units three times daily with meals. Hgb A1C with PCP   4. Chronic kidney disease (CKD), stage IV (severe) (HCC) Continue to monitor. BMP in 1-2 weeks with PCP   5. Benign hypertension with CKD (chronic kidney disease) stage  IV (HCC) B/p stable. Continue on Coreg 3.125 mg Tablet, imdur 60  6. Coronary artery disease involving native coronary artery of native heart without angina pectoris Chest pain free. Continue on clopidogrel 75 mg Tablet.   7. CHRONIC MIGRAINE W/O AURA W/INTRACTABLE W/SM Stable. Continue on ToPamax 100 mg tablet  8. Gastroesophageal reflux disease without esophagitis Continue on Protonix 40 mg Tablet   9. Generalized anxiety disorder Stable. Continue on alprazolam.  10. HLD (hyperlipidemia) Continue on simvastatin 20 mg Tablet. Lipid panel with PCP  11. Iron deficiency anemia Chronic Hgb 7.7. Continue to follow up with oncologist. Has appointment for iron infusion 08/02/2016.   12. Depressive disorder No mood changes. Continue on Prozac 20 mg Capsule.   13.  S/P hip hemiarthroplasty Status post Short term Rehabilitation Status post right femoral neck fracture s/p arthroplasty on 06/07/16.    Patient is being discharged with the following home health services:    PT/OT to continue with ROM, Exercise, Gait stability and muscle strengthening.  Patient is being discharged with the following durable medical equipment:    DME 16 X 18 standard WC with Cushion, anti tippers, extended brake handles, removable elevating leg rests  to allow her to maintain current level of independence with ADL's.  Patient has been advised to f/u with their PCP in 1-2 weeks to bring them up to date on their rehab stay.  Social services at facility was responsible for arranging this appointment.  Pt was provided with a 30 day supply of prescriptions for medications and refills must be obtained from their PCP.  For controlled substances, a more limited supply may be provided adequate until PCP appointment only.  Future labs/tests needed: CBC, BMP in 1-2 weeks with PCP

## 2016-07-31 NOTE — Progress Notes (Signed)
[facility]  [phone]) [phone] [phone]) [phone]  ID: [name] OB: 07-06-1951  MR#: [id number]  [id number]  Patient Care Team: [name], MD as PCP - General (Family Medicine)  CHIEF COMPLAINT: Anemia secondary to chronic renal insufficiency, MGUS.  INTERVAL HISTORY: Patient last evaluated in clinic in December 2016. She continues to have multiple medical problems including a recent broken hip. She continues to have chronic weakness and fatigue. She has no neurologic complaints. She denies any recent fevers. She has a poor appetite but denies weight loss. She denies any nausea, vomiting, constipation, or diarrhea. She denies any melena or hematochezia.  She has no urinary complaints. Patient states she feels terrible, but offers no further specific complaints today.   REVIEW OF SYSTEMS:   Review of Systems  Constitutional: Positive for malaise/fatigue. Negative for fever and weight loss.  Respiratory: Positive for shortness of breath. Negative for cough.   Cardiovascular: Positive for chest pain and leg swelling.  Gastrointestinal: Positive for abdominal pain and nausea. Negative for blood in stool, melena and vomiting.  Genitourinary: Negative.   Musculoskeletal: Positive for joint pain.  Neurological: Positive for weakness.  Psychiatric/Behavioral: Positive for depression and memory loss.    As per HPI. Otherwise, a complete review of systems is negatve.  PAST MEDICAL HISTORY: Past Medical History:  Diagnosis Date  . Anemia   . ANEMIA, OTHER, UNSPECIFIED 02/27/2007  . Blood transfusion without reported diagnosis   . Cellulitis and abscess of unspecified site 05/02/2010   Qualifier: Diagnosis of  By: Deborra Medina MD, Tanja Port    . CORONARY, ARTERIOSCLEROSIS 02/27/2007  . DEPRESSIVE DISORDER, NOS 02/27/2007  . DIABETES MELLITUS, II, COMPLICATIONS [id number]  . GASTROESOPHAGEAL REFLUX, NO ESOPHAGITIS 02/27/2007  . HYPERLIPIDEMIA 02/27/2007  .  HYPERTENSION, BENIGN SYSTEMIC 02/27/2007  . 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 Renal failure, acute on chronic (HCC) 01/25/2014  . RENAL INSUFFICIENCY, CHRONIC 12/28/2009  . TIA 05/17/2009    PAST SURGICAL HISTORY: Past Surgical History:  Procedure Laterality Date  . BLADDER SURGERY     Tack  . HIP ARTHROPLASTY Right 06/07/2016   Procedure: ARTHROPLASTY BIPOLAR HIP (HEMIARTHROPLASTY);  Surgeon: Altamese Sunflower, MD;  Location: [facility];  Service: Orthopedics;  Laterality: Right;  . PARTIAL HYSTERECTOMY    . RECTOCELE REPAIR      FAMILY HISTORY Family History  Problem Relation Age of Onset  . Cancer Mother     unknown  . CAD Mother   . Cancer Sister     unknown cancer ?breast ca       ADVANCED DIRECTIVES:    HEALTH MAINTENANCE: Social History  Substance Use Topics  . Smoking status: Never Smoker  . Smokeless tobacco: Never Used  . Alcohol use No     Colonoscopy:  PAP:  Bone density:  Lipid panel:  Allergies  Allergen Reactions  . Codeine Other (See Comments)    Noted as an allergy on MAR  . Hydralazine Hcl Swelling  . Bisphosphonates Rash  . Codeine Phosphate Nausea And Vomiting and Rash  . Penicillins Itching, Rash and Other (See Comments)    Has patient had a PCN reaction causing immediate rash, facial/tongue/throat swelling, SOB or lightheadedness with hypotension: No Has patient had a PCN reaction causing severe rash involving mucus membranes or skin necrosis: No Has patient had a PCN reaction that required hospitalization No Has patient had a PCN reaction occurring within the last 10 years: No If all of the above answers are "NO", then may proceed with Cephalosporin use.  Pt tolerant to cephalosporins   . Sulfa Antibiotics Itching and Rash    Current Outpatient Prescriptions  Medication Sig Dispense Refill  . acetaminophen (TYLENOL) 325 MG tablet Take 2 tablets (650 mg total) by mouth every 6 (six) hours as needed for mild pain (or Fever >/= 101).    Marland Kitchen  ALPRAZolam (XANAX) 0.5 MG tablet Take 1 tablet (0.5 mg total) by mouth 3 (three) times daily as needed for anxiety. 10 tablet 0  . calcium acetate (PHOSLO) 667 MG capsule Take 2 capsules (1,334 mg total) by mouth 3 (three) times daily with meals.    . carvedilol (COREG) 3.125 MG tablet Take 1 tablet (3.125 mg total) by mouth 2 (two) times daily with a meal. 60 tablet 0  . Cholecalciferol (VITAMIN D3) 5000 units TABS Take 5,000 Units by mouth daily.    . Choline Fenofibrate (FENOFIBRIC ACID) 135 MG CPDR Take 135 mg by mouth at bedtime.     . clopidogrel (PLAVIX) 75 MG tablet Take 75 mg by mouth at bedtime. For CAD    . diphenoxylate-atropine (LOMOTIL) 2.5-0.025 MG/5ML liquid Take 10 mLs by mouth 4 (four) times daily as needed for diarrhea or loose stools. 30 mL 0  . FLUoxetine (PROZAC) 20 MG capsule Take 20 mg by mouth daily. For depression    . gabapentin (NEURONTIN) 300 MG capsule Take 600 mg by mouth 3 (three) times daily.     . insulin glargine (LANTUS) 100 UNIT/ML injection Inject 6 Units into the skin at bedtime.     . insulin lispro (HUMALOG) 100 UNIT/ML injection Inject 3 Units into the skin 3 (three) times daily before meals.    . isosorbide mononitrate (IMDUR) 60 MG 24 hr tablet Take 1 tablet (60 mg total) by mouth daily. 30 tablet 0  . lip balm (BLISTEX) OINT Apply 1 application topically as needed for lip care. 1 Tube 0  . nitroGLYCERIN (NITROSTAT) 0.4 MG SL tablet Place 0.4 mg under the tongue every 5 (five) minutes as needed for chest pain. Reported on 06/13/2016    . pantoprazole (PROTONIX) 40 MG tablet Take 1 tablet (40 mg total) by mouth 2 (two) times daily.    . potassium chloride (KLOR-CON) 20 MEQ packet once. Take 120 ml of potassium twice daily with Med-[employer] product by mouth.    . simvastatin (ZOCOR) 20 MG tablet Take 20 mg by mouth at bedtime. For hyperlipidemia    . topiramate (TOPAMAX) 100 MG tablet Take 100 mg by mouth 2 (two) times daily.      No current  facility-administered medications for this visit.     OBJECTIVE: Vitals:   08/01/16 1230  BP: 138/69  Pulse: 90  Resp: 16  Temp: (!) 95.1 F (35.1 C)     Body mass index is 17.87 kg/m.    ECOG FS:2 - Symptomatic, <50% confined to bed  General: Well-developed, well-nourished, no acute distress.Sitting in a wheelchair. Eyes: Pink conjunctiva, anicteric sclera. Lungs: Clear to auscultation bilaterally. Heart: Regular rate and rhythm. No rubs, murmurs, or gallops. Abdomen: Soft, nontender, nondistended. No organomegaly noted, normoactive bowel sounds. Musculoskeletal: No edema, cyanosis, or clubbing. Neuro: Alert, answering all questions appropriately. Cranial nerves grossly intact. Skin: No rashes or petechiae noted. Psych: Normal affect.   LAB RESULTS:  Lab Results  Component Value Date   NA 138 08/01/2016   K 4.0 08/01/2016   CL 97 (L) 08/01/2016   CO2 34 (H) 08/01/2016   GLUCOSE 131 (H) 08/01/2016   BUN 44 (H)  08/01/2016   CREATININE 2.61 (H) 08/01/2016   CALCIUM 9.3 08/01/2016   PROT 4.1 (L) 06/14/2016   ALBUMIN 1.7 (L) 07/04/2016   AST 14 07/11/2016   ALT 7 07/11/2016   ALKPHOS 61 07/11/2016   BILITOT 0.8 06/14/2016   GFRNONAA 18 (L) 08/01/2016   GFRAA 21 (L) 08/01/2016    Lab Results  Component Value Date   WBC 3.8 08/01/2016   NEUTROABS 7.6 06/14/2016   HGB 9.2 (L) 08/01/2016   HCT 27.3 (L) 08/01/2016   MCV 95.7 08/01/2016   PLT 188 08/01/2016   Lab Results  Component Value Date   IRON 101 08/01/2016   TIBC 282 08/01/2016   IRONPCTSAT 36 (H) 08/01/2016   Lab Results  Component Value Date   FERRITIN 1,362 (H) 08/01/2016   Lab Results  Component Value Date   TOTALPROTELP 5.9 (L) 08/01/2016   ALBUMINELP 3.2 08/01/2016   A1GS 0.3 08/01/2016   A2GS 0.5 08/01/2016   BETS 0.7 08/01/2016   BETA2SER 3.6 01/25/2014   GAMS 1.2 08/01/2016   MSPIKE 0.5 (H) 08/01/2016   SPEI Comment 08/01/2016     STUDIES: No results found.  ASSESSMENT: Anemia  secondary to chronic renal insufficiency, MGUS.  PLAN:    1. Anemia: Although patient's hemoglobin is less than 10.0, due to insurance purposes cannot give Procrit today. Have offered patient to come back in 1 week to receive her Procrit, but her sister states she is moving her to [place] to stay with her. Because of this, a referral was made to [facility] in [place] for consideration of Procrit and continuation of care. No further follow-up has been scheduled.  Iron stores, B12, and folate are all within normal limits. 2. MGUS: Patient has an M spike of 0.5 which is unchanged. Continue to monitor every 6 months. 3. Chest pain/shortness breath: Patient states her symptoms are improving. Continue follow-up with cardiology. 4. Renal insufficiency: Patient's creatinine appears to be approximately her baseline.  Approximately 30 minutes was spent in discussion of which greater than 50% was consultation.  Patient expressed understanding and was in agreement with this plan. She also understands that She can call clinic at any time with any questions, concerns, or complaints.    Lloyd Huger, MD   08/03/2016 5:50 PM

## 2016-08-01 ENCOUNTER — Inpatient Hospital Stay: Payer: Commercial Managed Care - HMO | Attending: Oncology | Admitting: *Deleted

## 2016-08-01 ENCOUNTER — Encounter: Payer: Self-pay | Admitting: Oncology

## 2016-08-01 ENCOUNTER — Inpatient Hospital Stay (HOSPITAL_BASED_OUTPATIENT_CLINIC_OR_DEPARTMENT_OTHER): Payer: Commercial Managed Care - HMO | Admitting: Oncology

## 2016-08-01 VITALS — BP 138/69 | HR 90 | Temp 95.1°F | Resp 16 | Ht 63.0 in | Wt 100.9 lb

## 2016-08-01 DIAGNOSIS — Z794 Long term (current) use of insulin: Secondary | ICD-10-CM | POA: Diagnosis not present

## 2016-08-01 DIAGNOSIS — M7989 Other specified soft tissue disorders: Secondary | ICD-10-CM

## 2016-08-01 DIAGNOSIS — I129 Hypertensive chronic kidney disease with stage 1 through stage 4 chronic kidney disease, or unspecified chronic kidney disease: Secondary | ICD-10-CM

## 2016-08-01 DIAGNOSIS — Z79899 Other long term (current) drug therapy: Secondary | ICD-10-CM | POA: Insufficient documentation

## 2016-08-01 DIAGNOSIS — Z8781 Personal history of (healed) traumatic fracture: Secondary | ICD-10-CM | POA: Diagnosis not present

## 2016-08-01 DIAGNOSIS — R531 Weakness: Secondary | ICD-10-CM

## 2016-08-01 DIAGNOSIS — D472 Monoclonal gammopathy: Secondary | ICD-10-CM

## 2016-08-01 DIAGNOSIS — I2510 Atherosclerotic heart disease of native coronary artery without angina pectoris: Secondary | ICD-10-CM | POA: Insufficient documentation

## 2016-08-01 DIAGNOSIS — K219 Gastro-esophageal reflux disease without esophagitis: Secondary | ICD-10-CM | POA: Insufficient documentation

## 2016-08-01 DIAGNOSIS — E119 Type 2 diabetes mellitus without complications: Secondary | ICD-10-CM | POA: Diagnosis not present

## 2016-08-01 DIAGNOSIS — N184 Chronic kidney disease, stage 4 (severe): Secondary | ICD-10-CM

## 2016-08-01 DIAGNOSIS — E785 Hyperlipidemia, unspecified: Secondary | ICD-10-CM | POA: Insufficient documentation

## 2016-08-01 DIAGNOSIS — R079 Chest pain, unspecified: Secondary | ICD-10-CM | POA: Insufficient documentation

## 2016-08-01 DIAGNOSIS — I110 Hypertensive heart disease with heart failure: Secondary | ICD-10-CM | POA: Diagnosis not present

## 2016-08-01 DIAGNOSIS — R0602 Shortness of breath: Secondary | ICD-10-CM | POA: Insufficient documentation

## 2016-08-01 DIAGNOSIS — Z8673 Personal history of transient ischemic attack (TIA), and cerebral infarction without residual deficits: Secondary | ICD-10-CM | POA: Insufficient documentation

## 2016-08-01 DIAGNOSIS — D631 Anemia in chronic kidney disease: Secondary | ICD-10-CM

## 2016-08-01 DIAGNOSIS — M2550 Pain in unspecified joint: Secondary | ICD-10-CM

## 2016-08-01 DIAGNOSIS — Z809 Family history of malignant neoplasm, unspecified: Secondary | ICD-10-CM | POA: Diagnosis not present

## 2016-08-01 DIAGNOSIS — R109 Unspecified abdominal pain: Secondary | ICD-10-CM

## 2016-08-01 DIAGNOSIS — R413 Other amnesia: Secondary | ICD-10-CM | POA: Insufficient documentation

## 2016-08-01 DIAGNOSIS — F329 Major depressive disorder, single episode, unspecified: Secondary | ICD-10-CM | POA: Insufficient documentation

## 2016-08-01 DIAGNOSIS — R5383 Other fatigue: Secondary | ICD-10-CM | POA: Diagnosis not present

## 2016-08-01 DIAGNOSIS — F413 Other mixed anxiety disorders: Secondary | ICD-10-CM

## 2016-08-01 LAB — FERRITIN: FERRITIN: 1362 ng/mL — AB (ref 11–307)

## 2016-08-01 LAB — IRON AND TIBC
IRON: 101 ug/dL (ref 28–170)
SATURATION RATIOS: 36 % — AB (ref 10.4–31.8)
TIBC: 282 ug/dL (ref 250–450)
UIBC: 181 ug/dL

## 2016-08-01 LAB — BASIC METABOLIC PANEL
Anion gap: 7 (ref 5–15)
BUN: 44 mg/dL — AB (ref 6–20)
CHLORIDE: 97 mmol/L — AB (ref 101–111)
CO2: 34 mmol/L — ABNORMAL HIGH (ref 22–32)
Calcium: 9.3 mg/dL (ref 8.9–10.3)
Creatinine, Ser: 2.61 mg/dL — ABNORMAL HIGH (ref 0.44–1.00)
GFR calc non Af Amer: 18 mL/min — ABNORMAL LOW (ref >60)
GFR, EST AFRICAN AMERICAN: 21 mL/min — AB (ref >60)
Glucose, Bld: 131 mg/dL — ABNORMAL HIGH (ref 65–99)
POTASSIUM: 4 mmol/L (ref 3.5–5.1)
SODIUM: 138 mmol/L (ref 135–145)

## 2016-08-01 LAB — CBC
HCT: 27.3 % — ABNORMAL LOW (ref 35.0–47.0)
HEMOGLOBIN: 9.2 g/dL — AB (ref 12.0–16.0)
MCH: 32.3 pg (ref 26.0–34.0)
MCHC: 33.7 g/dL (ref 32.0–36.0)
MCV: 95.7 fL (ref 80.0–100.0)
Platelets: 188 10*3/uL (ref 150–440)
RBC: 2.86 MIL/uL — AB (ref 3.80–5.20)
RDW: 17.8 % — ABNORMAL HIGH (ref 11.5–14.5)
WBC: 3.8 10*3/uL (ref 3.6–11.0)

## 2016-08-01 NOTE — Patient Instructions (Signed)
Patient seen in clinic today, hgb 9.2. Patient could not receive procrit today due to needing prior authorization from insurance company. Patient is transferring care to [facility], referral entered today.

## 2016-08-01 NOTE — Progress Notes (Signed)
Pt reports: Recent hospital stay, Hx of heart attack according to pt related to low hemoglobin, Fall with hip fracture.  Dehydrated today.  Wound on back from recent hospital stay and not turning.  No appetite.

## 2016-08-02 LAB — KAPPA/LAMBDA LIGHT CHAINS
KAPPA FREE LGHT CHN: 178.4 mg/L — AB (ref 3.3–19.4)
Kappa, lambda light chain ratio: 5.13 — ABNORMAL HIGH (ref 0.26–1.65)
Lambda free light chains: 34.8 mg/L — ABNORMAL HIGH (ref 5.7–26.3)

## 2016-08-02 LAB — PROTEIN ELECTROPHORESIS, SERUM
A/G RATIO SPE: 1.2 (ref 0.7–1.7)
ALPHA-1-GLOBULIN: 0.3 g/dL (ref 0.0–0.4)
Albumin ELP: 3.2 g/dL (ref 2.9–4.4)
Alpha-2-Globulin: 0.5 g/dL (ref 0.4–1.0)
Beta Globulin: 0.7 g/dL (ref 0.7–1.3)
GLOBULIN, TOTAL: 2.7 g/dL (ref 2.2–3.9)
Gamma Globulin: 1.2 g/dL (ref 0.4–1.8)
M-SPIKE, %: 0.5 g/dL — AB
TOTAL PROTEIN ELP: 5.9 g/dL — AB (ref 6.0–8.5)

## 2016-09-06 ENCOUNTER — Telehealth: Payer: Self-pay | Admitting: Hematology & Oncology

## 2016-09-06 NOTE — Telephone Encounter (Signed)
Received referral from Lloyd Huger on 08/02/2016. I have been calling this patient for the last month to schedule a New Patient appt with Dr. Marin Olp. Patient's phone always has a busy signal. I have called all of patient's contacts. No one has returned my calls. Cancelling referral at this time.       [facility].

## 2017-06-24 IMAGING — CR DG CHEST 1V PORT
1 series · 1 of 1 positions shown · non-contrast
Comparison: Chest radiograph 06/15/2016.

CLINICAL DATA: Patient with acute respiratory failure. Severe
shortness of breath.

EXAM:
PORTABLE CHEST 1 VIEW

[AP]
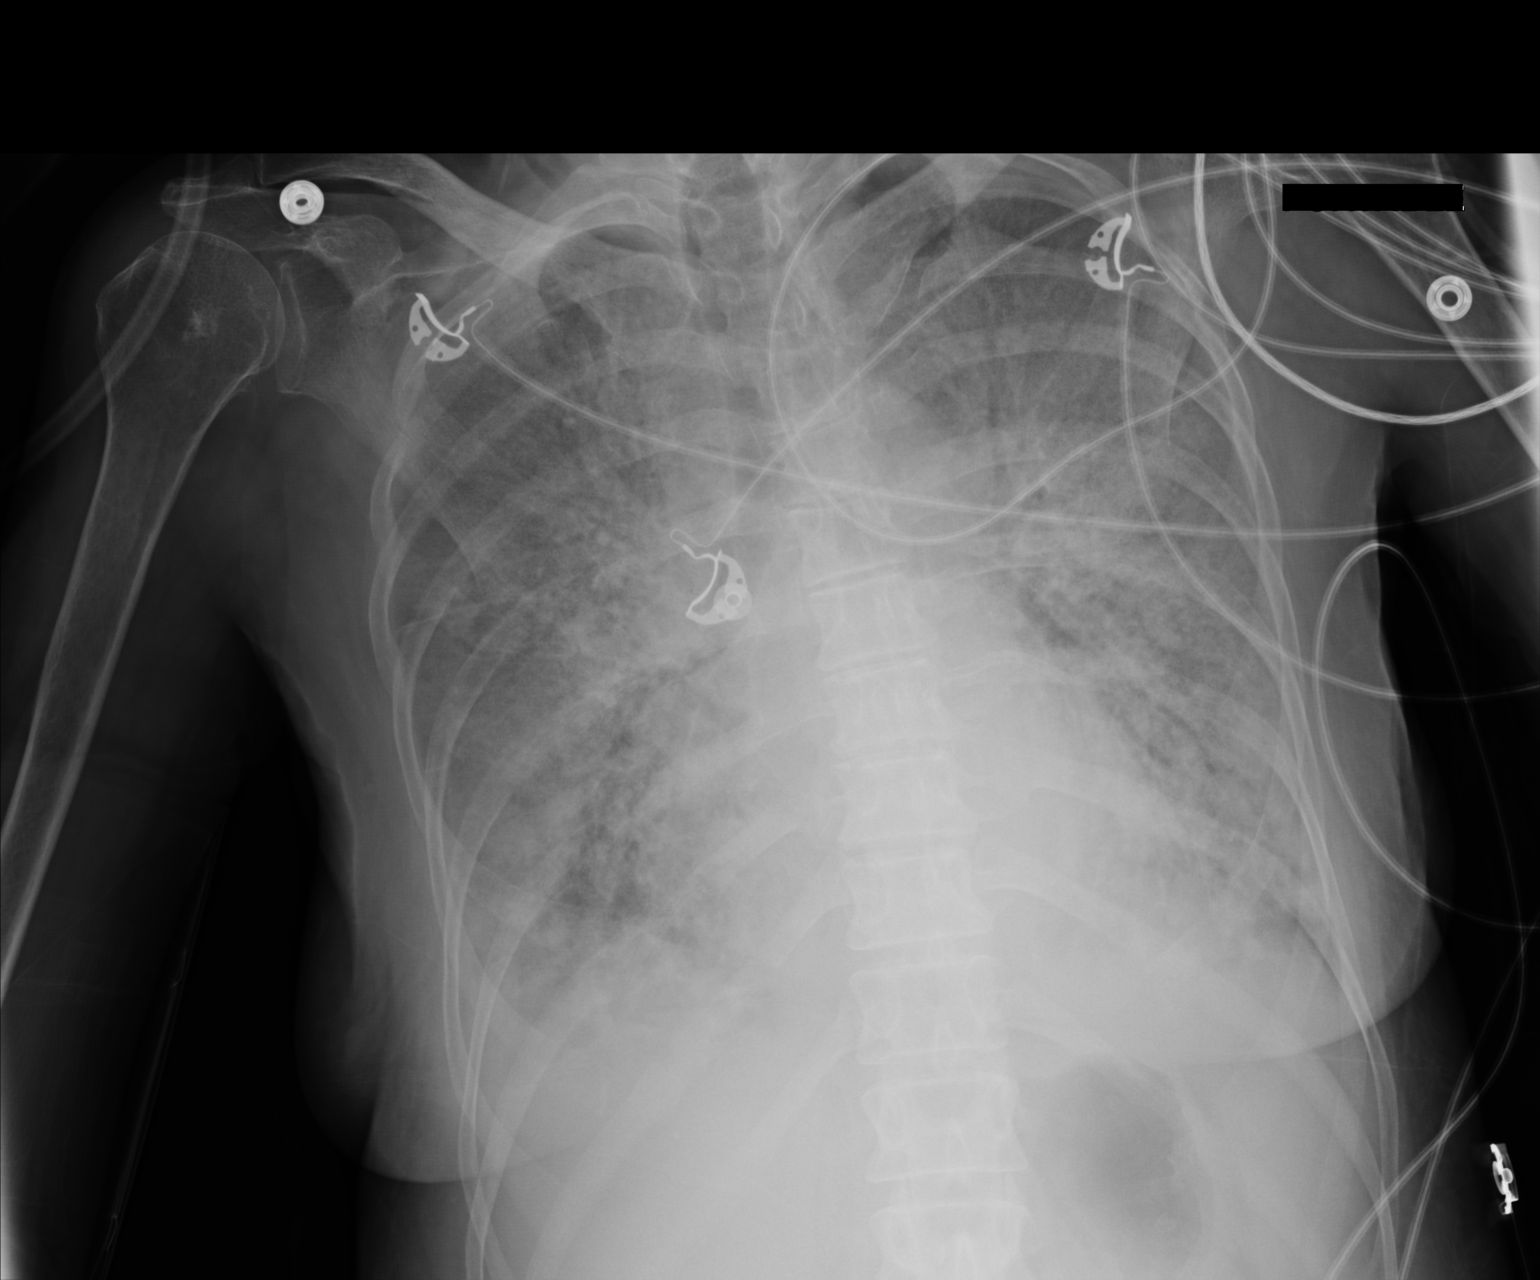

[1 of 1 positions shown; findings below may reference images not displayed]

FINDINGS: Monitoring leads overlie the patient. Cardiomediastinal contours
largely obscured. Grossly unchanged diffuse bilateral airspace
opacities. Probable small bilateral pleural Fahrenheit scratch of
small bilateral pleural effusions. No pneumothorax.
IMPRESSION: Grossly unchanged diffuse bilateral airspace opacities.

Small bilateral pleural effusions.

## 2017-11-30 ENCOUNTER — Other Ambulatory Visit: Payer: Self-pay | Admitting: Nurse Practitioner

## 2021-11-04 ENCOUNTER — Other Ambulatory Visit: Payer: Self-pay | Admitting: Nurse Practitioner
